# Patient Record
Sex: Female | Born: 1985 | Race: White | Hispanic: No | Smoking: Current every day smoker
Health system: Southern US, Community
[De-identification: ages and names within clinical notes are randomized; demographics above are authoritative.]

## PROBLEM LIST (undated history)

## (undated) DIAGNOSIS — F319 Bipolar disorder, unspecified: Secondary | ICD-10-CM

## (undated) DIAGNOSIS — M069 Rheumatoid arthritis, unspecified: Secondary | ICD-10-CM

## (undated) DIAGNOSIS — J45909 Unspecified asthma, uncomplicated: Secondary | ICD-10-CM

## (undated) DIAGNOSIS — R42 Dizziness and giddiness: Secondary | ICD-10-CM

## (undated) DIAGNOSIS — F39 Unspecified mood [affective] disorder: Secondary | ICD-10-CM

## (undated) DIAGNOSIS — B370 Candidal stomatitis: Secondary | ICD-10-CM

## (undated) DIAGNOSIS — F419 Anxiety disorder, unspecified: Secondary | ICD-10-CM

## (undated) DIAGNOSIS — M419 Scoliosis, unspecified: Secondary | ICD-10-CM

## (undated) DIAGNOSIS — R519 Headache, unspecified: Secondary | ICD-10-CM

## (undated) DIAGNOSIS — K219 Gastro-esophageal reflux disease without esophagitis: Secondary | ICD-10-CM

## (undated) DIAGNOSIS — F209 Schizophrenia, unspecified: Secondary | ICD-10-CM

## (undated) DIAGNOSIS — N946 Dysmenorrhea, unspecified: Secondary | ICD-10-CM

## (undated) DIAGNOSIS — R51 Headache: Secondary | ICD-10-CM

## (undated) HISTORY — DX: Dysmenorrhea, unspecified: N94.6

## (undated) HISTORY — DX: Headache, unspecified: R51.9

## (undated) HISTORY — DX: Anxiety disorder, unspecified: F41.9

## (undated) HISTORY — PX: NERVE REPAIR: SHX2083

## (undated) HISTORY — DX: Dizziness and giddiness: R42

## (undated) HISTORY — DX: Rheumatoid arthritis, unspecified: M06.9

## (undated) HISTORY — DX: Unspecified mood (affective) disorder: F39

## (undated) HISTORY — PX: BREAST IMPLANT REMOVAL: SUR1101

## (undated) HISTORY — DX: Headache: R51

## (undated) HISTORY — DX: Candidal stomatitis: B37.0

## (undated) HISTORY — PX: BREAST SURGERY: SHX581

## (undated) HISTORY — PX: MOUTH SURGERY: SHX715

---

## 2003-03-27 ENCOUNTER — Emergency Department (HOSPITAL_COMMUNITY): Admission: EM | Admit: 2003-03-27 | Discharge: 2003-03-27 | Payer: Self-pay | Admitting: Emergency Medicine

## 2005-10-31 ENCOUNTER — Emergency Department (HOSPITAL_COMMUNITY): Admission: EM | Admit: 2005-10-31 | Discharge: 2005-10-31 | Payer: Self-pay | Admitting: Emergency Medicine

## 2007-01-05 ENCOUNTER — Emergency Department (HOSPITAL_COMMUNITY): Admission: EM | Admit: 2007-01-05 | Discharge: 2007-01-06 | Payer: Self-pay | Admitting: Emergency Medicine

## 2007-01-13 ENCOUNTER — Emergency Department (HOSPITAL_COMMUNITY): Admission: EM | Admit: 2007-01-13 | Discharge: 2007-01-14 | Payer: Self-pay | Admitting: Emergency Medicine

## 2007-02-11 ENCOUNTER — Emergency Department (HOSPITAL_COMMUNITY): Admission: EM | Admit: 2007-02-11 | Discharge: 2007-02-11 | Payer: Self-pay | Admitting: Emergency Medicine

## 2007-03-25 ENCOUNTER — Emergency Department (HOSPITAL_COMMUNITY): Admission: EM | Admit: 2007-03-25 | Discharge: 2007-03-25 | Payer: Self-pay | Admitting: *Deleted

## 2007-03-30 ENCOUNTER — Ambulatory Visit (HOSPITAL_COMMUNITY): Admission: RE | Admit: 2007-03-30 | Discharge: 2007-03-30 | Payer: Self-pay | Admitting: Obstetrics and Gynecology

## 2007-04-05 ENCOUNTER — Emergency Department (HOSPITAL_COMMUNITY): Admission: EM | Admit: 2007-04-05 | Discharge: 2007-04-05 | Payer: Self-pay | Admitting: Emergency Medicine

## 2007-05-01 ENCOUNTER — Ambulatory Visit (HOSPITAL_COMMUNITY): Admission: RE | Admit: 2007-05-01 | Discharge: 2007-05-01 | Payer: Self-pay | Admitting: Obstetrics and Gynecology

## 2007-05-22 ENCOUNTER — Ambulatory Visit (HOSPITAL_COMMUNITY): Admission: RE | Admit: 2007-05-22 | Discharge: 2007-05-22 | Payer: Self-pay | Admitting: Obstetrics and Gynecology

## 2007-06-21 ENCOUNTER — Ambulatory Visit (HOSPITAL_COMMUNITY): Admission: RE | Admit: 2007-06-21 | Discharge: 2007-06-21 | Payer: Self-pay | Admitting: Obstetrics and Gynecology

## 2007-07-22 ENCOUNTER — Inpatient Hospital Stay (HOSPITAL_COMMUNITY): Admission: AD | Admit: 2007-07-22 | Discharge: 2007-07-22 | Payer: Self-pay | Admitting: Obstetrics and Gynecology

## 2007-08-20 ENCOUNTER — Inpatient Hospital Stay (HOSPITAL_COMMUNITY): Admission: RE | Admit: 2007-08-20 | Discharge: 2007-08-22 | Payer: Self-pay | Admitting: Obstetrics and Gynecology

## 2007-09-23 ENCOUNTER — Emergency Department (HOSPITAL_COMMUNITY): Admission: EM | Admit: 2007-09-23 | Discharge: 2007-09-23 | Payer: Self-pay | Admitting: Emergency Medicine

## 2010-04-05 ENCOUNTER — Emergency Department (HOSPITAL_COMMUNITY)
Admission: EM | Admit: 2010-04-05 | Discharge: 2010-04-05 | Payer: Self-pay | Source: Home / Self Care | Admitting: Emergency Medicine

## 2010-05-16 ENCOUNTER — Encounter: Payer: Self-pay | Admitting: Obstetrics and Gynecology

## 2010-06-20 ENCOUNTER — Emergency Department (HOSPITAL_COMMUNITY)
Admission: EM | Admit: 2010-06-20 | Discharge: 2010-06-20 | Disposition: A | Discharge status: Home / Self Care | Attending: Emergency Medicine | Admitting: Emergency Medicine

## 2010-06-20 DIAGNOSIS — K089 Disorder of teeth and supporting structures, unspecified: Secondary | ICD-10-CM | POA: Insufficient documentation

## 2010-06-20 DIAGNOSIS — K029 Dental caries, unspecified: Secondary | ICD-10-CM | POA: Insufficient documentation

## 2010-06-20 DIAGNOSIS — F17200 Nicotine dependence, unspecified, uncomplicated: Secondary | ICD-10-CM | POA: Insufficient documentation

## 2010-06-26 ENCOUNTER — Emergency Department (HOSPITAL_COMMUNITY)
Admission: EM | Admit: 2010-06-26 | Discharge: 2010-06-26 | Disposition: A | Discharge status: Home / Self Care | Attending: Emergency Medicine | Admitting: Emergency Medicine

## 2010-06-26 DIAGNOSIS — F3130 Bipolar disorder, current episode depressed, mild or moderate severity, unspecified: Secondary | ICD-10-CM | POA: Insufficient documentation

## 2010-06-26 DIAGNOSIS — R220 Localized swelling, mass and lump, head: Secondary | ICD-10-CM | POA: Insufficient documentation

## 2010-06-26 DIAGNOSIS — R6884 Jaw pain: Secondary | ICD-10-CM | POA: Insufficient documentation

## 2010-06-26 DIAGNOSIS — K089 Disorder of teeth and supporting structures, unspecified: Secondary | ICD-10-CM | POA: Insufficient documentation

## 2010-06-26 DIAGNOSIS — R221 Localized swelling, mass and lump, neck: Secondary | ICD-10-CM | POA: Insufficient documentation

## 2010-06-26 DIAGNOSIS — K029 Dental caries, unspecified: Secondary | ICD-10-CM | POA: Insufficient documentation

## 2010-07-29 ENCOUNTER — Emergency Department (HOSPITAL_COMMUNITY)
Admission: EM | Admit: 2010-07-29 | Discharge: 2010-07-29 | Disposition: A | Discharge status: Home / Self Care | Attending: Emergency Medicine | Admitting: Emergency Medicine

## 2010-07-29 ENCOUNTER — Emergency Department (HOSPITAL_COMMUNITY)

## 2010-07-29 DIAGNOSIS — J40 Bronchitis, not specified as acute or chronic: Secondary | ICD-10-CM | POA: Insufficient documentation

## 2010-07-29 DIAGNOSIS — R112 Nausea with vomiting, unspecified: Secondary | ICD-10-CM | POA: Insufficient documentation

## 2010-07-29 DIAGNOSIS — R062 Wheezing: Secondary | ICD-10-CM | POA: Insufficient documentation

## 2010-07-29 DIAGNOSIS — R05 Cough: Secondary | ICD-10-CM | POA: Insufficient documentation

## 2010-07-29 DIAGNOSIS — J3489 Other specified disorders of nose and nasal sinuses: Secondary | ICD-10-CM | POA: Insufficient documentation

## 2010-07-29 DIAGNOSIS — R197 Diarrhea, unspecified: Secondary | ICD-10-CM | POA: Insufficient documentation

## 2010-07-29 DIAGNOSIS — F17200 Nicotine dependence, unspecified, uncomplicated: Secondary | ICD-10-CM | POA: Insufficient documentation

## 2010-07-29 DIAGNOSIS — R509 Fever, unspecified: Secondary | ICD-10-CM | POA: Insufficient documentation

## 2010-07-29 DIAGNOSIS — R059 Cough, unspecified: Secondary | ICD-10-CM | POA: Insufficient documentation

## 2010-07-29 DIAGNOSIS — F3130 Bipolar disorder, current episode depressed, mild or moderate severity, unspecified: Secondary | ICD-10-CM | POA: Insufficient documentation

## 2010-09-07 NOTE — Discharge Summary (Signed)
NAME:  Laura Montoya, Laura Montoya              ACCOUNT NO.:  [id number]   MEDICAL RECORD NO.:  [id number]          PATIENT TYPE:  INP   LOCATION:  9123                          FACILITY:  [facility]   PHYSICIAN:  Malachi Pro. Ambrose Mantle, M.D. DATE OF BIRTH:  Jul 25, 1985   DATE OF ADMISSION:  08/20/2007  DATE OF DISCHARGE:  08/22/2007                               DISCHARGE SUMMARY   HISTORY OF PRESENT ILLNESS:  This is a 25 year old white separated  female para 0-0-2-0 gravida 3, EDC August 17, 2007, admitted for  induction of labor.  Blood group and type O positive, negative antibody,  nonreactive serology, rubella immune, hepatitis B surface antigen  negative, HIV negative, GC and chlamydia negative.  One-hour Glucola 82.  Group B strep negative.  Ultrasound on March 30, 2007, showed an  average gestational age of [redacted] weeks and 4 days, [facility] August 20, 2007.  Two-  vessel cord was seen.  The patient declined first and second trimester  screens.  She was advised to quit smoking.  Fetal echocardiogram was  normal.  Nonstress test were begun for two-vessel cord and were normal.  Ultrasound showed normal AFI an average for gestational age infant.  On  August 13, 2007, the cervix was at fingertip 40% vertex at a -3.   PAST MEDICAL HISTORY:  No known allergies.   OPERATIONS:  Dilatation and curettage.   ILLNESSES:  Asthma, depression, bipolar, alcohol, tobacco, drugs, and  cocaine use in 2006.   SOCIAL HISTORY:  The patient smoked 3 to 6 cigarettes a day.   FAMILY HISTORY:  Mother with hypothyroidism.  Maternal grandmother high  blood pressure.  Paternal grandmother high blood pressure and maternal  grandfather diabetes.   OBSTETRIC HISTORY:  Spontaneous abortion at 7-8 weeks in 2002 and 2004.   Physical exam on admission revealed normal vital signs.  The heart and  lungs were normal.  The abdomen was soft.  On August 13, 2007, the fundal  height had been 36 cm.  Fetal heart tones were reactive.  Irregular  contractions were present.  There were no decelerations.  Cervix was 2  cm, 60% vertex at a -2.  Artificial rupture of membranes produced clear  fluid.  The patient impressions on admission were intrauterine pregnancy  at 39+ weeks, two-vessel cord, history of cocaine and tobacco use, and  ASCUS pap.  At 1:10 p.m. by the nurse's exam, the cervix was 4 cm, 80%  vertex at a -1.  At 5:40 p.m. the cervix was 9+ cm, 100% vertex at a +1.  The patient became fully dilated and pushed well.  She delivered  spontaneously OA over a second-degree midline and bilateral labial  lacerations a living female infant 7 pounds 2 ounces, Apgars of 9 at 1  and 9 at 5 minutes.  Placenta was intact.  Uterus was normal.  There was  mild shoulder dystocia managed with McRoberts maneuver.  Second-degree  midline laceration in the right labial laceration repaired with 3-0 and  2-0 Vicryl.  Left labial laceration was hemostatic and not repaired.  Blood loss about 400 mL.  Postpartum, the  patient did well and was  discharged on the second postpartum day.  She is planning a trip to  [place] in approximately 10 days.  She is advised to stop the car and  walk around every one and a half to two hours.  Initial hemoglobin was  13.3, hematocrit 37.7, white count 20,000, and platelet count 472,000.  Followup hemoglobin 12.3 and white count 26,800.  RPR was nonreactive.   FINAL DIAGNOSES:  Intrauterine pregnancy at 39 plus weeks, delivered  occipitoanterior position.  History of cocaine use and tobacco use, two-  vessel cord, and ASCUS pap.   OPERATION:  Spontaneous delivery occipitoanterior position, repair of  second-degree midline laceration, and right labial laceration.   FINAL CONDITION:  Improved.   INSTRUCTIONS:  Include our regular discharge instruction booklet.  Percocet 5/325 20 tablets one every 4-6 hours as needed for pain and  Motrin 600 mg 30 tablets one every 6 hours as needed for pain.  She is  to return  in 6 weeks for followup examination.  The patient is also  advised to be on the alert any signs or symptoms of postpartum  depression.      Malachi Pro. Ambrose Mantle, M.D.  Electronically Signed     TFH/MEDQ  D:  08/22/2007  T:  08/22/2007  Job:  [id number]

## 2010-11-08 ENCOUNTER — Emergency Department (HOSPITAL_COMMUNITY)
Admission: EM | Admit: 2010-11-08 | Discharge: 2010-11-08 | Disposition: A | Discharge status: Home / Self Care | Attending: Emergency Medicine | Admitting: Emergency Medicine

## 2010-11-08 DIAGNOSIS — K029 Dental caries, unspecified: Secondary | ICD-10-CM | POA: Insufficient documentation

## 2010-11-08 DIAGNOSIS — K089 Disorder of teeth and supporting structures, unspecified: Secondary | ICD-10-CM | POA: Insufficient documentation

## 2010-11-08 DIAGNOSIS — F319 Bipolar disorder, unspecified: Secondary | ICD-10-CM | POA: Insufficient documentation

## 2011-01-17 LAB — URINALYSIS, ROUTINE W REFLEX MICROSCOPIC
Glucose, UA: NEGATIVE
Hgb urine dipstick: NEGATIVE
Nitrite: NEGATIVE
Protein, ur: NEGATIVE
Urobilinogen, UA: 0.2

## 2011-01-18 LAB — CBC
HCT: 37.7
Hemoglobin: 12.3
Hemoglobin: 13.3
MCV: 99.2
Platelets: 385
Platelets: 472 — ABNORMAL HIGH
RBC: 3.8 — ABNORMAL LOW

## 2011-01-18 LAB — RPR: RPR Ser Ql: NONREACTIVE

## 2011-01-19 LAB — DIFFERENTIAL
Basophils Relative: 0
Eosinophils Relative: 5
Lymphs Abs: 1.6
Monocytes Absolute: 0.6
Monocytes Relative: 10
Neutro Abs: 3.3
Neutrophils Relative %: 58

## 2011-01-19 LAB — CBC
MCHC: 35.1
RDW: 12.5
WBC: 5.8

## 2011-02-01 LAB — DIFFERENTIAL
Basophils Relative: 1
Lymphocytes Relative: 22
Lymphs Abs: 2.3
Neutrophils Relative %: 71

## 2011-02-01 LAB — I-STAT 8, (EC8 V) (CONVERTED LAB)
Bicarbonate: 22.6
Chloride: 105
Operator id: 257131
Sodium: 136
TCO2: 24
pCO2, Ven: 42.1 — ABNORMAL LOW

## 2011-02-01 LAB — CBC
HCT: 37.2
MCHC: 34.3
MCV: 97.1
RBC: 3.84 — ABNORMAL LOW
WBC: 10.1

## 2011-02-01 LAB — URINALYSIS, ROUTINE W REFLEX MICROSCOPIC
Hgb urine dipstick: NEGATIVE
Urobilinogen, UA: 0.2

## 2011-02-01 LAB — WET PREP, GENITAL
WBC, Wet Prep HPF POC: NONE SEEN
Yeast Wet Prep HPF POC: NONE SEEN

## 2011-02-01 LAB — GC/CHLAMYDIA PROBE AMP, GENITAL: GC Probe Amp, Genital: NEGATIVE

## 2011-02-01 LAB — RPR: RPR Ser Ql: NONREACTIVE

## 2011-02-01 LAB — POCT I-STAT CREATININE: Operator id: 257131

## 2011-02-10 ENCOUNTER — Emergency Department (HOSPITAL_COMMUNITY)
Admission: EM | Admit: 2011-02-10 | Discharge: 2011-02-10 | Disposition: A | Discharge status: Home / Self Care | Payer: Self-pay | Attending: Emergency Medicine | Admitting: Emergency Medicine

## 2011-02-10 DIAGNOSIS — K089 Disorder of teeth and supporting structures, unspecified: Secondary | ICD-10-CM | POA: Insufficient documentation

## 2011-08-29 ENCOUNTER — Ambulatory Visit: Admitting: Physical Therapy

## 2011-09-05 ENCOUNTER — Ambulatory Visit: Payer: Medicaid Other | Attending: Family Medicine | Admitting: Physical Therapy

## 2011-09-05 DIAGNOSIS — IMO0001 Reserved for inherently not codable concepts without codable children: Secondary | ICD-10-CM | POA: Insufficient documentation

## 2011-09-05 DIAGNOSIS — M545 Low back pain, unspecified: Secondary | ICD-10-CM | POA: Insufficient documentation

## 2011-09-05 DIAGNOSIS — M546 Pain in thoracic spine: Secondary | ICD-10-CM | POA: Insufficient documentation

## 2011-09-05 DIAGNOSIS — M25559 Pain in unspecified hip: Secondary | ICD-10-CM | POA: Insufficient documentation

## 2011-09-20 ENCOUNTER — Ambulatory Visit: Payer: Medicaid Other | Admitting: Physical Therapy

## 2011-09-28 ENCOUNTER — Ambulatory Visit: Payer: Medicaid Other | Attending: Family Medicine | Admitting: Physical Therapy

## 2011-09-28 DIAGNOSIS — M545 Low back pain, unspecified: Secondary | ICD-10-CM | POA: Insufficient documentation

## 2011-09-28 DIAGNOSIS — M546 Pain in thoracic spine: Secondary | ICD-10-CM | POA: Insufficient documentation

## 2011-09-28 DIAGNOSIS — M25559 Pain in unspecified hip: Secondary | ICD-10-CM | POA: Insufficient documentation

## 2011-09-28 DIAGNOSIS — IMO0001 Reserved for inherently not codable concepts without codable children: Secondary | ICD-10-CM | POA: Insufficient documentation

## 2011-10-05 ENCOUNTER — Encounter: Admitting: Physical Therapy

## 2011-10-31 ENCOUNTER — Emergency Department (HOSPITAL_COMMUNITY)
Admission: EM | Admit: 2011-10-31 | Discharge: 2011-10-31 | Disposition: A | Discharge status: Home / Self Care | Payer: Medicaid Other | Attending: Emergency Medicine | Admitting: Emergency Medicine

## 2011-10-31 ENCOUNTER — Encounter (HOSPITAL_COMMUNITY): Payer: Self-pay | Admitting: Emergency Medicine

## 2011-10-31 DIAGNOSIS — R51 Headache: Secondary | ICD-10-CM | POA: Insufficient documentation

## 2011-10-31 DIAGNOSIS — J3489 Other specified disorders of nose and nasal sinuses: Secondary | ICD-10-CM | POA: Insufficient documentation

## 2011-10-31 DIAGNOSIS — F17200 Nicotine dependence, unspecified, uncomplicated: Secondary | ICD-10-CM | POA: Insufficient documentation

## 2011-10-31 DIAGNOSIS — H9209 Otalgia, unspecified ear: Secondary | ICD-10-CM | POA: Insufficient documentation

## 2011-10-31 DIAGNOSIS — Z7982 Long term (current) use of aspirin: Secondary | ICD-10-CM | POA: Insufficient documentation

## 2011-10-31 HISTORY — DX: Unspecified asthma, uncomplicated: J45.909

## 2011-10-31 MED ORDER — AZITHROMYCIN 250 MG PO TABS: ORAL_TABLET | ORAL | Status: AC

## 2011-10-31 NOTE — ED Notes (Signed)
Pt presenting to ed with c/o left earache and headache x 1 month pt states she saw her pcp and was placed on antibiotics but symptoms persist.

## 2011-10-31 NOTE — ED Provider Notes (Signed)
History     CSN: [id number]  Arrival date & time 10/31/11  1638   First MD Initiated Contact with Patient 10/31/11 1752      Chief Complaint  Patient presents with  . Otalgia  . Headache    (Consider location/radiation/quality/duration/timing/severity/associated sxs/prior treatment) HPI Comments: Patient presents with complaint of left sided face pain, earache, and headache that has been occurring for approximately one month. Patient saw her primary care physician and was diagnosed with a sinus infection. She took a course of amoxicillin that temporarily improved the symptoms. Patient states that over the past week her symptoms have been gradually worsening. She denies fever, nausea, vomiting, or diarrhea. Patient has been treating at home with Tylenol and Aleve, and Phenergan. These helped somewhat. No sick contacts. Onset was gradual. Course is gradually worsening. Nothing makes the symptoms worse. She has not seen an ENT physician before.  Patient is a 26 y.o. female presenting with ear pain and headaches. The history is provided by the patient.  Otalgia This is a new problem. The current episode started more than 1 week ago. There is pain in the left ear. The problem occurs constantly. The problem has been gradually worsening. There has been no fever. The pain is mild. Associated symptoms include headaches, rhinorrhea and cough. Pertinent negatives include no ear discharge, no sore throat, no abdominal pain, no diarrhea, no vomiting and no rash.  Headache  Pertinent negatives include no fever, no nausea and no vomiting.    Past Medical History  Diagnosis Date  . Asthma     Past Surgical History  Procedure Date  . Breast surgery     No family history on file.  History  Substance Use Topics  . Smoking status: Current Everyday Smoker    Types: Cigarettes  . Smokeless tobacco: Not on file  . Alcohol Use: No    OB History    Grav Para Term Preterm Abortions TAB SAB Ect  Mult Living                  Review of Systems  Constitutional: Negative for fever.  HENT: Positive for ear pain, rhinorrhea and sinus pressure. Negative for sore throat and ear discharge.   Eyes: Negative for redness.  Respiratory: Positive for cough.   Cardiovascular: Negative for chest pain.  Gastrointestinal: Negative for nausea, vomiting, abdominal pain and diarrhea.  Genitourinary: Negative for dysuria.  Musculoskeletal: Negative for myalgias.  Skin: Negative for rash.  Neurological: Positive for headaches.    Allergies  Review of patient's allergies indicates no known allergies.  Home Medications   Current Outpatient Rx  Name Route Sig Dispense Refill  . ACETAMINOPHEN 500 MG PO TABS Oral Take 1,000 mg by mouth every 6 (six) hours as needed. pain    . ASPIRIN EC 81 MG PO TBEC Oral Take 81 mg by mouth daily.    Marland Kitchen METHOCARBAMOL 500 MG PO TABS Oral Take 500-1,000 mg by mouth 2 (two) times daily as needed. Muscle spasms    . NAPROXEN 500 MG PO TABS Oral Take 1,000 mg by mouth 2 (two) times daily as needed.    Marland Kitchen PROMETHAZINE HCL 12.5 MG PO TABS Oral Take 12.5 mg by mouth every 6 (six) hours as needed. nausea      BP 109/64  Pulse 56  Temp 98.9 F (37.2 C)  Resp 20  SpO2 100%  LMP 10/27/2011  Physical Exam  Nursing note and vitals reviewed. Constitutional: She appears well-developed and well-nourished.  HENT:  Head: Normocephalic and atraumatic.  Right Ear: Tympanic membrane, external ear and ear canal normal.  Left Ear: Tympanic membrane, external ear and ear canal normal.  Nose: Mucosal edema and rhinorrhea present. Right sinus exhibits no maxillary sinus tenderness and no frontal sinus tenderness. Left sinus exhibits maxillary sinus tenderness and frontal sinus tenderness.  Eyes: Conjunctivae are normal. Right eye exhibits no discharge. Left eye exhibits no discharge.  Neck: Normal range of motion. Neck supple.  Cardiovascular: Normal rate, regular rhythm and  normal heart sounds.   Pulmonary/Chest: Effort normal and breath sounds normal.  Abdominal: Soft. There is no tenderness.  Neurological: She is alert.  Skin: Skin is warm and dry.  Psychiatric: She has a normal mood and affect.    ED Course  Procedures (including critical care time)  Labs Reviewed - No data to display No results found.   1. Facial pain     6:29 PM Patient seen and examined. Urged pt to use sudafed, afrin x 3 days, as needed. Urged ENT follow-up. Will give course abx.   Vital signs reviewed and are as follows: Filed Vitals:   10/31/11 1718  BP: 109/64  Pulse: 56  Temp: 98.9 F (37.2 C)  Resp: 20      MDM  Sinus symptoms x 1 month. Will try abx given prolonged symptoms. Ultimately I think ENT follow-up will be beneficial and referral given. No concern for abscess.         Renne Crigler, [place] 11/03/11 0700

## 2011-11-04 NOTE — ED Provider Notes (Signed)
Medical screening examination/treatment/procedure(s) were performed by non-physician practitioner and as supervising physician I was immediately available for consultation/collaboration.    Micaela Stith R Romaine Neville, MD 11/04/11 1109 

## 2012-05-08 ENCOUNTER — Encounter (HOSPITAL_COMMUNITY): Payer: Self-pay | Admitting: *Deleted

## 2012-05-08 ENCOUNTER — Emergency Department (HOSPITAL_COMMUNITY)
Admission: EM | Admit: 2012-05-08 | Discharge: 2012-05-08 | Disposition: A | Discharge status: Home / Self Care | Payer: Medicaid Other | Attending: Emergency Medicine | Admitting: Emergency Medicine

## 2012-05-08 DIAGNOSIS — R21 Rash and other nonspecific skin eruption: Secondary | ICD-10-CM | POA: Insufficient documentation

## 2012-05-08 DIAGNOSIS — F17200 Nicotine dependence, unspecified, uncomplicated: Secondary | ICD-10-CM | POA: Insufficient documentation

## 2012-05-08 DIAGNOSIS — L299 Pruritus, unspecified: Secondary | ICD-10-CM | POA: Insufficient documentation

## 2012-05-08 DIAGNOSIS — Z8659 Personal history of other mental and behavioral disorders: Secondary | ICD-10-CM | POA: Insufficient documentation

## 2012-05-08 DIAGNOSIS — Z8739 Personal history of other diseases of the musculoskeletal system and connective tissue: Secondary | ICD-10-CM | POA: Insufficient documentation

## 2012-05-08 DIAGNOSIS — T4995XA Adverse effect of unspecified topical agent, initial encounter: Secondary | ICD-10-CM | POA: Insufficient documentation

## 2012-05-08 DIAGNOSIS — T7840XA Allergy, unspecified, initial encounter: Secondary | ICD-10-CM

## 2012-05-08 DIAGNOSIS — J45909 Unspecified asthma, uncomplicated: Secondary | ICD-10-CM | POA: Insufficient documentation

## 2012-05-08 DIAGNOSIS — Z79899 Other long term (current) drug therapy: Secondary | ICD-10-CM | POA: Insufficient documentation

## 2012-05-08 DIAGNOSIS — Z7982 Long term (current) use of aspirin: Secondary | ICD-10-CM | POA: Insufficient documentation

## 2012-05-08 HISTORY — DX: Schizophrenia, unspecified: F20.9

## 2012-05-08 HISTORY — DX: Scoliosis, unspecified: M41.9

## 2012-05-08 MED ORDER — PREDNISONE (PAK) 10 MG PO TABS: ORAL_TABLET | ORAL | Status: DC

## 2012-05-08 MED ORDER — DIPHENHYDRAMINE HCL 50 MG/ML IJ SOLN
50.0000 mg | Freq: Once | INTRAMUSCULAR | Status: AC
  Administered 2012-05-08: 50 mg via INTRAMUSCULAR
  Filled 2012-05-08: qty 1

## 2012-05-08 MED ORDER — PREDNISONE 20 MG PO TABS
60.0000 mg | ORAL_TABLET | Freq: Once | ORAL | Status: AC
  Administered 2012-05-08: 60 mg via ORAL
  Filled 2012-05-08: qty 3

## 2012-05-08 NOTE — ED Provider Notes (Signed)
History   This chart was scribed for non-physician practitioner working with Laura Booze, MD by Sofie Rower, ED Scribe. This patient was seen in room TR10C/TR10C and the patient's care was started at 5:45PM.   CSN: [id number]  Arrival date & time 05/08/12  1707   First MD Initiated Contact with Patient 05/08/12 1745      Chief Complaint  Patient presents with  . Allergic Reaction    (Consider location/radiation/quality/duration/timing/severity/associated sxs/prior treatment) Patient is a 27 y.o. female presenting with allergic reaction and rash. The history is provided by the patient. No language interpreter was used.  Allergic Reaction The primary symptoms are  rash. The primary symptoms do not include shortness of breath. The current episode started 3 to 5 hours ago. The problem has been gradually worsening. This is a new problem.  The rash is associated with itching.  Significant symptoms also include itching.  Rash  The current episode started 3 to 5 hours ago. The problem has been gradually worsening. The problem is associated with an unknown factor. There has been no fever. The rash is present on the trunk, left lower leg, right lower leg and neck. The pain is moderate. The pain has been constant since onset. Associated symptoms include itching. Treatments tried: benadryl. The treatment provided no relief.    Laura Montoya is a 27 y.o. female , with a hx of asthma, schizophrenia, and sinus infection, who presents to the Emergency Department complaining of sudden, progressively worsening allergic reaction, onset today (05/08/12).  Associated symptoms include diffuse itching and eye swelling located at the left eye. The pt reports she has not been in contact with any new chemicals nor applied any new lotions or soaps which may have triggered her allergic reaction. The pt has taken benadryl which does not provide relief of the itching.  The pt denies shortness of breath and difficulty  swallowing.   The pt is a current everyday smoker, however, she does not drink alcohol.      Past Medical History  Diagnosis Date  . Asthma   . Scoliosis   . Schizophrenia     Past Surgical History  Procedure Date  . Breast surgery     No family history on file.  History  Substance Use Topics  . Smoking status: Current Every Day Smoker    Types: Cigarettes  . Smokeless tobacco: Not on file  . Alcohol Use: No    OB History    Grav Para Term Preterm Abortions TAB SAB Ect Mult Living                  Review of Systems  Respiratory: Negative for shortness of breath.   Skin: Positive for itching and rash.  All other systems reviewed and are negative.    Allergies  Review of patient's allergies indicates no known allergies.  Home Medications   Current Outpatient Rx  Name  Route  Sig  Dispense  Refill  . ACETAMINOPHEN 500 MG PO TABS   Oral   Take 1,000 mg by mouth every 6 (six) hours as needed. pain         . ASPIRIN EC 81 MG PO TBEC   Oral   Take 81 mg by mouth daily.         Marland Kitchen METHOCARBAMOL 500 MG PO TABS   Oral   Take 500-1,000 mg by mouth 2 (two) times daily as needed. Muscle spasms         . NAPROXEN  500 MG PO TABS   Oral   Take 1,000 mg by mouth 2 (two) times daily as needed.         Marland Kitchen PROMETHAZINE HCL 12.5 MG PO TABS   Oral   Take 12.5 mg by mouth every 6 (six) hours as needed. nausea           BP 114/48  Pulse 74  Temp 98.7 F (37.1 C) (Oral)  Resp 18  SpO2 100%  Physical Exam  Nursing note and vitals reviewed. Constitutional: She is oriented to person, place, and time. She appears well-developed and well-nourished.  HENT:  Head: Atraumatic.  Right Ear: External ear normal.  Left Ear: External ear normal.  Nose: Nose normal.  Mouth/Throat: No oropharyngeal exudate.  Eyes: EOM are normal.  Neck: Normal range of motion.  Cardiovascular: Normal rate, regular rhythm and normal heart sounds.   Pulmonary/Chest: Effort  normal and breath sounds normal. No respiratory distress.  Abdominal: Soft. Bowel sounds are normal.  Musculoskeletal: Normal range of motion. She exhibits no edema and no tenderness.  Neurological: She is alert and oriented to person, place, and time.  Skin: Skin is warm and dry. Rash noted.       Urticaria to trunk base, lower extremities, ears and neck.   Psychiatric: She has a normal mood and affect. Her behavior is normal.    ED Course  Procedures (including critical care time)  DIAGNOSTIC STUDIES: Oxygen Saturation is 100% on room air, normal by my interpretation.    COORDINATION OF CARE:  5:49 PM- Treatment plan discussed with patient. Pt agrees with treatment.      Labs Reviewed - No data to display No results found.   No diagnosis found.  Allergic reaction to unknown substance.  Urticaria of extremities, truck, neck, ears, face.  No airway involvement.  Symptoms improved with IM benadryl and 60 mg prednisone po.  Patient to follow-up with her PCP for allergy testing.  MDM   I personally performed the services described in this documentation, which was scribed in my presence. The recorded information has been reviewed and is accurate.         Jimmye Norman, NP 05/09/12 [phone]

## 2012-05-08 NOTE — ED Notes (Signed)
Pt states itching head to toe and has left eye swelling since waking up around 0830.  No other symptoms

## 2012-05-09 NOTE — ED Provider Notes (Signed)
Medical screening examination/treatment/procedure(s) were performed by non-physician practitioner and as supervising physician I was immediately available for consultation/collaboration.   Jonah Gingras, MD 05/09/12 1608 

## 2012-05-28 ENCOUNTER — Encounter: Payer: Self-pay | Admitting: Physical Medicine & Rehabilitation

## 2012-06-12 ENCOUNTER — Ambulatory Visit (HOSPITAL_COMMUNITY)
Admission: RE | Admit: 2012-06-12 | Discharge: 2012-06-12 | Disposition: A | Discharge status: Home / Self Care | Payer: Medicaid Other | Source: Ambulatory Visit | Attending: Physical Medicine & Rehabilitation | Admitting: Physical Medicine & Rehabilitation

## 2012-06-12 ENCOUNTER — Encounter: Payer: Self-pay | Admitting: Physical Medicine & Rehabilitation

## 2012-06-12 ENCOUNTER — Encounter: Payer: Medicaid Other | Attending: Physical Medicine & Rehabilitation | Admitting: Physical Medicine & Rehabilitation

## 2012-06-12 VITALS — BP 67/44 | HR 102 | Resp 14 | Ht 62.0 in | Wt 113.0 lb

## 2012-06-12 DIAGNOSIS — G8929 Other chronic pain: Secondary | ICD-10-CM | POA: Insufficient documentation

## 2012-06-12 DIAGNOSIS — M4120 Other idiopathic scoliosis, site unspecified: Secondary | ICD-10-CM | POA: Insufficient documentation

## 2012-06-12 DIAGNOSIS — Z5181 Encounter for therapeutic drug level monitoring: Secondary | ICD-10-CM

## 2012-06-12 DIAGNOSIS — M4716 Other spondylosis with myelopathy, lumbar region: Secondary | ICD-10-CM

## 2012-06-12 DIAGNOSIS — F17200 Nicotine dependence, unspecified, uncomplicated: Secondary | ICD-10-CM | POA: Insufficient documentation

## 2012-06-12 DIAGNOSIS — M8580 Other specified disorders of bone density and structure, unspecified site: Secondary | ICD-10-CM | POA: Insufficient documentation

## 2012-06-12 DIAGNOSIS — M949 Disorder of cartilage, unspecified: Secondary | ICD-10-CM

## 2012-06-12 DIAGNOSIS — M545 Low back pain, unspecified: Secondary | ICD-10-CM | POA: Insufficient documentation

## 2012-06-12 MED ORDER — NAPROXEN 500 MG PO TABS: 1000.0000 mg | ORAL_TABLET | Freq: Two times a day (BID) | ORAL | Status: DC

## 2012-06-12 NOTE — Progress Notes (Deleted)
  Subjective:    Patient ID: Laura Montoya, female    DOB: [birth year], 27 y.o.   MRN: [id number]  HPI  Pain Inventory Average Pain 6 Pain Right Now 6 My pain is constant, sharp, burning, stabbing and aching  In the last 24 hours, has pain interfered with the following? General activity 5 Relation with others 4 Enjoyment of life 7 What TIME of day is your pain at its worst? morning and night Sleep (in general) Poor  Pain is worse with: bending, sitting, standing and some activites Pain improves with: rest, heat/ice, pacing activities and medication Relief from Meds: 5  Mobility use a cane how many minutes can you walk? 10-15 ability to climb steps?  yes do you drive?  yes Do you have any goals in this area?  no  Function not employed: date last employed 2008 disabled: date disabled 2006 I need assistance with the following:  dressing and bathing Do you have any goals in this area?  yes  Neuro/Psych bladder control problems weakness tingling spasms dizziness depression anxiety suicidal thoughts  Prior Studies bone scan x-rays  Physicians involved in your care Lonie Peak   History reviewed. No pertinent family history. History   Social History  . Marital Status: Married    Spouse Name: N/A    Number of Children: N/A  . Years of Education: N/A   Social History Main Topics  . Smoking status: Current Every Day Smoker    Types: Cigarettes  . Smokeless tobacco: None  . Alcohol Use: No  . Drug Use: No  . Sexually Active:    Other Topics Concern  . None   Social History Narrative  . None   Past Surgical History  Procedure Laterality Date  . Breast surgery     Past Medical History  Diagnosis Date  . Asthma   . Scoliosis   . Schizophrenia    BP 67/44  Pulse 102  Resp 14  Ht 5\' 2"  (1.575 m)  Wt 113 lb (51.256 kg)  BMI 20.66 kg/m2  SpO2 100%  LMP 06/10/2012     Review of Systems     Objective:   Physical Exam         Assessment & Plan:

## 2012-06-12 NOTE — Progress Notes (Signed)
Subjective:    Patient ID: Laura Montoya, female    DOB: 09/25/1985, 27 y.o.   MRN: [id number]  HPI  This is an initial evaluation for Laura Montoya who is here with complaints of chronic mid and low back pain. She remembers first having back pain when she was 27 years old after a metal pole fell on her at home. She was not hospitalized. She had persistent pain throughout her teenage years which has gone on until now. The pain radiated down from the mid back to her pelvis. Her sleep is poor. Bending makes the pain worse. She has a hard time really staying in one position for any period of time. Picking up a gallon of milk is difficult and causes pain in her low back. Sometimes heat helps. Rest breaks and muscle rubs help. Naproxen helps but she rarely uses it because she is afraid of liver toxicity. She uses tylenol occasionally. Robaxin is used at night to help with spasm and sleep.   She was in the army from 2005 to 2006. She developed stress fx'es in her hips amongst other areas and ultimately she had to leave.   sShe reports xrays, CT's, bone scans of her back the last of which was in 2011. She was told she has scoliosis of her spine, ?osteopenia. Laura Montoya also reports them mentioned a problem reported at the L4-5 level. PT was done earlier last year. She does her exercises fairly regularly. She didn't see a whole lot of benefit with therapy.  Headaches are under fairly good control. She has a headache every couple weeks. She has no aura.   Her right shoulder causes pain at times as well.   She has a 53 year old daughter at home.      Pain Inventory Average Pain 6 Pain Right Now 6 My pain is constant, sharp, burning, stabbing and aching  In the last 24 hours, has pain interfered with the following? General activity 5 Relation with others 4 Enjoyment of life 7 What TIME of day is your pain at its worst? morning and night Sleep (in general) Poor  Pain is worse with: bending,  sitting, standing and some activites Pain improves with: rest, heat/ice, pacing activities and medication Relief from Meds: 5  Mobility use a cane how many minutes can you walk? 10-15 ability to climb steps?  yes do you drive?  yes Do you have any goals in this area?  no  Function not employed: date last employed 08 disabled: date disabled 06 I need assistance with the following:  dressing and bathing Do you have any goals in this area?  yes  Neuro/Psych bladder control problems weakness tingling spasms dizziness depression anxiety suicidal thoughts  Prior Studies bone scan x-rays  Physicians involved in your care Lonie Peak   History reviewed. No pertinent family history. History   Social History  . Marital Status: Married    Spouse Name: N/A    Number of Children: N/A  . Years of Education: N/A   Social History Main Topics  . Smoking status: Current Every Day Smoker    Types: Cigarettes  . Smokeless tobacco: None  . Alcohol Use: No  . Drug Use: No  . Sexually Active:    Other Topics Concern  . None   Social History Narrative  . None   Past Surgical History  Procedure Laterality Date  . Breast surgery     Past Medical History  Diagnosis Date  . Asthma   .  Scoliosis   . Schizophrenia    BP 67/44  Pulse 102  Resp 14  Ht 5\' 2"  (1.575 m)  Wt 113 lb (51.256 kg)  BMI 20.66 kg/m2  SpO2 100%  LMP 06/10/2012     Review of Systems  Constitutional: Positive for unexpected weight change.  Respiratory: Positive for cough.   Gastrointestinal: Positive for nausea.  Neurological: Positive for dizziness and weakness.  Psychiatric/Behavioral: Positive for dysphoric mood. The patient is nervous/anxious.   All other systems reviewed and are negative.       Objective:   Physical Exam  General: Alert and oriented x 3, No apparent distress HEENT: Head is normocephalic, atraumatic, PERRLA, EOMI, sclera anicteric, oral mucosa pink and moist,  edentulous, ext ear canals clear,  Neck: Supple without JVD or lymphadenopathy Heart: Reg rate and rhythm. No murmurs rubs or gallops Chest: CTA bilaterally without wheezes, rales, or rhonchi; no distress Abdomen: Soft, non-tender, non-distended, bowel sounds positive. Extremities: No clubbing, cyanosis, or edema. Pulses are 2+ Skin: Clean and intact without signs of breakdown. Numerous tattoos Neuro: Pt is cognitively appropriate with normal insight, memory, and awareness. Cranial nerves 2-12 are intact. Sensory exam is normal. Reflexes are 2+ in all 4's. Fine motor coordination is intact. No tremors. Motor function is grossly 5/5.  Musculoskeletal: Mild lumbar levoscoliosis into the thoracic spine. Pain most severe with palpation over the left PSIS into the low lumbar spine. Mild spasm seen in the left lumbar paraspinals as well as the lower thoracic area (right more than left) Perhaps a 0.25 inch left leg length discrepancy. Mild elevation of right hemipelvis Psych: Pt's affect is appropriate. Pt is cooperative         Assessment & Plan:  1. Chronic low back pain with mild lumbar levoscoliosis, likely discogenic pain at the L4-S1 levels.  2. Myofascial back pain 3. Osteopenia? 4. Hx of paranoid schizophrenia which appears under control  Plan: 1. Resume naproxen but at prescription dose 500mg  bid with food 2. Xrays of the lumbar spine to assess disk spaces and bone density 3. Begin calcium with vitamin D given hx. Discuss furtherlong term management as we progress 4. Pilates exercises were provided. Reviewed the importance of posture and regular exercise 5. 30 minutes of face to face patient care time were spent during this visit. All questions were encouraged and answered.  Follow up with me in 1 month

## 2012-06-12 NOTE — Patient Instructions (Signed)
CONTINUE TO WORK ON RANGE OF MOTION AND POSTURE  BE REALISTIC ABOUT YOUR ACTIVITIES AND EXERCISES

## 2012-06-13 ENCOUNTER — Telehealth: Payer: Self-pay | Admitting: Physical Medicine & Rehabilitation

## 2012-06-13 NOTE — Telephone Encounter (Signed)
I have personally reviewed her films and there are no signs of arthritis or disk space disease. She does have mild levoscoliosis which we noted on her exam. Proceed with current plan

## 2012-06-21 ENCOUNTER — Telehealth: Payer: Self-pay

## 2012-06-21 ENCOUNTER — Telehealth: Payer: Self-pay | Admitting: *Deleted

## 2012-06-21 NOTE — Telephone Encounter (Signed)
Message copied by Judd Gaudier on Thu Jun 21, 2012  8:23 AM ------      Message from: Su Monks      Created: Mon Jun 18, 2012 11:20 AM       She should be NON Narcotic ------

## 2012-06-21 NOTE — Telephone Encounter (Signed)
Returning someone's voicemail to call office. I spoke with Shimika and informed her of her UDS results being + THC and that she would be non narcotic treatment.  She said she understood.

## 2012-06-21 NOTE — Telephone Encounter (Signed)
Left message for patient to call office regarding inconsistent urine drug screen.

## 2012-07-09 ENCOUNTER — Encounter: Payer: Medicaid Other | Attending: Physical Medicine & Rehabilitation | Admitting: Physical Medicine & Rehabilitation

## 2012-07-09 ENCOUNTER — Encounter: Payer: Self-pay | Admitting: Physical Medicine & Rehabilitation

## 2012-07-09 VITALS — BP 94/68 | HR 80 | Resp 14 | Ht 62.0 in | Wt 114.0 lb

## 2012-07-09 DIAGNOSIS — G8929 Other chronic pain: Secondary | ICD-10-CM | POA: Insufficient documentation

## 2012-07-09 DIAGNOSIS — M8580 Other specified disorders of bone density and structure, unspecified site: Secondary | ICD-10-CM

## 2012-07-09 DIAGNOSIS — M4120 Other idiopathic scoliosis, site unspecified: Secondary | ICD-10-CM | POA: Insufficient documentation

## 2012-07-09 DIAGNOSIS — M461 Sacroiliitis, not elsewhere classified: Secondary | ICD-10-CM | POA: Insufficient documentation

## 2012-07-09 DIAGNOSIS — F17200 Nicotine dependence, unspecified, uncomplicated: Secondary | ICD-10-CM | POA: Insufficient documentation

## 2012-07-09 DIAGNOSIS — M545 Low back pain, unspecified: Secondary | ICD-10-CM | POA: Insufficient documentation

## 2012-07-09 DIAGNOSIS — M949 Disorder of cartilage, unspecified: Secondary | ICD-10-CM

## 2012-07-09 DIAGNOSIS — M4716 Other spondylosis with myelopathy, lumbar region: Secondary | ICD-10-CM

## 2012-07-09 NOTE — Patient Instructions (Signed)
WORK ON REGULAR STRETCHING AND RANGE OF MOTION.  USE GOOD POSTURE.

## 2012-07-09 NOTE — Progress Notes (Signed)
Subjective:    Patient ID: Laura Montoya, female    DOB: May 30, 1985, 27 y.o.   MRN: [id number]  HPI  Laura Montoya is back regarding her chronic back pain. The naproxen is helpful, and she is only taking it at night as that is when her pain is the worst. She is using robaxin also.  She continues with some of her basic stretches on a routine basis but doesn't always stretch her legs.  Xrays of her lumbar spine were essentially unremarkable except for a mild lumbar levoscoliosis.  She did reveal her left knee popped out of joint twice after she left my office, but not since.   Pain Inventory Average Pain 6 Pain Right Now 7 My pain is intermittent, burning, stabbing and tingling  In the last 24 hours, has pain interfered with the following? General activity 4 Relation with others 4 Enjoyment of life 5 What TIME of day is your pain at its worst? evening Sleep (in general) Fair  Pain is worse with: bending, sitting, standing and some activites Pain improves with: rest, heat/ice and pacing activities Relief from Meds: 4  Mobility walk without assistance use a cane how many minutes can you walk? 15 ability to climb steps?  yes do you drive?  yes Do you have any goals in this area?  no  Function not employed: date last employed 10 disabled: date disabled 05 I need assistance with the following:  dressing and bathing Do you have any goals in this area?  no  Neuro/Psych numbness tingling trouble walking spasms dizziness depression anxiety  Prior Studies Any changes since last visit?  no  Physicians involved in your care Sharrie Rothman, [place] [facility]-therapist   History reviewed. No pertinent family history. History   Social History  . Marital Status: Married    Spouse Name: N/A    Number of Children: N/A  . Years of Education: N/A   Social History Main Topics  . Smoking status: Current Every Day Smoker    Types: Cigarettes  . Smokeless tobacco: None  .  Alcohol Use: No  . Drug Use: No  . Sexually Active:    Other Topics Concern  . None   Social History Narrative  . None   Past Surgical History  Procedure Laterality Date  . Breast surgery     Past Medical History  Diagnosis Date  . Asthma   . Scoliosis   . Schizophrenia    BP 94/68  Pulse 80  Resp 14  Ht 5\' 2"  (1.575 m)  Wt 114 lb (51.71 kg)  BMI 20.85 kg/m2  SpO2 100%  LMP 06/10/2012     Review of Systems  Constitutional: Positive for appetite change and unexpected weight change.  Musculoskeletal: Positive for back pain and gait problem.  Neurological: Positive for numbness.  Psychiatric/Behavioral: Positive for dysphoric mood. The patient is nervous/anxious.   All other systems reviewed and are negative.       Objective:   Physical Exam General: Alert and oriented x 3, No apparent distress  HEENT: Head is normocephalic, atraumatic, PERRLA, EOMI, sclera anicteric, oral mucosa pink and moist, edentulous, ext ear canals clear,  Neck: Supple without JVD or lymphadenopathy  Heart: Reg rate and rhythm. No murmurs rubs or gallops  Chest: CTA bilaterally without wheezes, rales, or rhonchi; no distress  Abdomen: Soft, non-tender, non-distended, bowel sounds positive.  Extremities: No clubbing, cyanosis, or edema. Pulses are 2+  Skin: Clean and intact without signs of breakdown. Numerous tattoos  Neuro:  Pt is cognitively appropriate with normal insight, memory, and awareness. Cranial nerves 2-12 are intact. Sensory exam is normal. Reflexes are 2+ in all 4's. Fine motor coordination is intact. No tremors. Motor function is grossly 5/5.  Musculoskeletal: Mild lumbar levoscoliosis into the thoracic spine. Pain most severe with palpation over the left PSIS into the low lumbar spine. less spasm seen in the left lumbar paraspinals as well as the lower thoracic area (right more than left)-- ? 0.25 inch left leg length discrepancy. Mild elevation of right hemipelvis. popping  heard/felt in glenuhumeral joint with rapid abduction. Psych: Pt's affect is appropriate. Pt is cooperative   Assessment & Plan:   1. Chronic low back pain, SIJ pain, mild facet arthropathy  2. Myofascial back pain  3. Osteopenia?  4. Hx of paranoid schizophrenia which appears under control    Plan:  1. Continue naproxen in the evening with food as well as muscle relaxant. 2. An extensive handout on SIJ dysfunction and exercises/treatment was provided. Discussed the importance of regular range of motion, stabilizing joints, etc. She appears to have general ligamentous/joint laxity based on exam/history. 3. Continue calcium with vitamin D given hx.    4. Continue with cane for balance and appropriate posture. 5. 30 minutes of face to face patient care time were spent during this visit. All questions were encouraged and answered. Follow up with me in  3 months.

## 2012-10-10 ENCOUNTER — Encounter: Payer: Self-pay | Admitting: Physical Medicine & Rehabilitation

## 2012-10-10 ENCOUNTER — Encounter: Payer: Medicaid Other | Attending: Physical Medicine & Rehabilitation | Admitting: Physical Medicine & Rehabilitation

## 2012-10-10 VITALS — BP 100/63 | HR 93 | Resp 14 | Ht 62.0 in | Wt 111.0 lb

## 2012-10-10 DIAGNOSIS — M899 Disorder of bone, unspecified: Secondary | ICD-10-CM | POA: Insufficient documentation

## 2012-10-10 DIAGNOSIS — M4716 Other spondylosis with myelopathy, lumbar region: Secondary | ICD-10-CM | POA: Insufficient documentation

## 2012-10-10 DIAGNOSIS — M8580 Other specified disorders of bone density and structure, unspecified site: Secondary | ICD-10-CM

## 2012-10-10 DIAGNOSIS — M949 Disorder of cartilage, unspecified: Secondary | ICD-10-CM

## 2012-10-10 DIAGNOSIS — M4120 Other idiopathic scoliosis, site unspecified: Secondary | ICD-10-CM | POA: Insufficient documentation

## 2012-10-10 DIAGNOSIS — M461 Sacroiliitis, not elsewhere classified: Secondary | ICD-10-CM | POA: Insufficient documentation

## 2012-10-10 MED ORDER — TRAMADOL HCL 50 MG PO TABS: 50.0000 mg | ORAL_TABLET | Freq: Three times a day (TID) | ORAL | Status: DC | PRN

## 2012-10-10 NOTE — Progress Notes (Signed)
Subjective:    Patient ID: Laura Montoya, female    DOB: [birth year], 27 y.o.   MRN: [id number]  HPI  Ysabelle is back regarding her low back pain. She has generally been doing fairly well other than suffering from a sinus infection recently. She continues with her basic exercises and stretches. She has been less active recently with the sinus problems. She tries to do restistance training as possible. She has noticed some popping in her shoulder blades more recently.   She prefers hot to cold for pain relief. She likes the hot weather we are experiencing currently.       Pain Inventory Average Pain 7 Pain Right Now 6 My pain is intermittent, sharp, burning and dull  In the last 24 hours, has pain interfered with the following? General activity 6 Relation with others 6 Enjoyment of life 6 What TIME of day is your pain at its worst? evening and night Sleep (in general) Poor  Pain is worse with: sitting, standing and some activites Pain improves with: rest, heat/ice, therapy/exercise, pacing activities and medication Relief from Meds: 4  Mobility walk without assistance walk with assistance use a cane how many minutes can you walk? 30 ability to climb steps?  yes do you drive?  yes Do you have any goals in this area?  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  yes  Neuro/Psych bladder control problems weakness numbness tingling spasms dizziness depression anxiety  Prior Studies Any changes since last visit?  no  Physicians involved in your care Any changes since last visit?  no   Family History  Problem Relation Age of Onset  . Thyroid disease Mother   . Mental illness Father    History   Social History  . Marital Status: Married    Spouse Name: N/A    Number of Children: N/A  . Years of Education: N/A   Social History  Main Topics  . Smoking status: Current Every Day Smoker    Types: Cigarettes  . Smokeless tobacco: None  . Alcohol Use: No  . Drug Use: No  . Sexually Active:    Other Topics Concern  . None   Social History Narrative  . None   Past Surgical History  Procedure Laterality Date  . Breast surgery     Past Medical History  Diagnosis Date  . Asthma   . Scoliosis   . Schizophrenia    BP 100/63  Pulse 93  Resp 14  Ht 5\' 2"  (1.575 m)  Wt 111 lb (50.349 kg)  BMI 20.3 kg/m2  SpO2 98%  LMP 10/05/2012     Review of Systems  Constitutional: Positive for appetite change and unexpected weight change.  Respiratory: Positive for cough.   Gastrointestinal: Positive for nausea.  Genitourinary: Positive for difficulty urinating.  Neurological: Positive for dizziness, weakness and numbness.  Psychiatric/Behavioral: Positive for dysphoric mood. The patient is nervous/anxious.   All other systems reviewed and are negative.       Objective:   Physical Exam  General: Alert and oriented x 3, No apparent distress  HEENT: Head is normocephalic, atraumatic, PERRLA, EOMI, sclera anicteric, oral mucosa pink and moist, edentulous, ext ear canals clear,  Neck: Supple without JVD or lymphadenopathy  Heart: Reg rate and rhythm. No murmurs rubs or gallops  Chest: CTA bilaterally without wheezes, rales, or rhonchi;  no distress  Abdomen: Soft, non-tender, non-distended, bowel sounds positive.  Extremities: No clubbing, cyanosis, or edema. Pulses are 2+  Skin: Clean and intact without signs of breakdown. Numerous tattoos  Neuro: Pt is cognitively appropriate with normal insight, memory, and awareness. Cranial nerves 2-12 are intact. Sensory exam is normal. Reflexes are 2+ in all 4's. Fine motor coordination is intact. No tremors. Motor function is grossly 5/5.  Musculoskeletal: Mild lumbar levoscoliosis into the thoracic spine.  Mild pain at PSIS joints. She was able to flex to 90 degrees  without significant distress.  ? 0.25 inch left leg length discrepancy. Mild elevation of right hemipelvis. popping heard/felt in glenuhumeral joint with rapid abduction. There was also some crepitus along the medial borders of the scapula.  Psych: Pt's affect is appropriate. Pt is cooperative   Assessment & Plan:   1. Chronic low back pain, SIJ pain, mild facet arthropathy  2. Myofascial back pain  3. Osteopenia?  4. Hx of paranoid schizophrenia which appears under control    Plan:  1. Continue naproxen in the evening with food as well as muscle relaxant.  2. Reviewed basic exercise principles and joint laxity. I prefer that she pursue strength training exercises with low resistance initially.  3. Continue calcium with vitamin D given hx.  4. Continue with cane for balance and appropriate posture.  5. Rx'ed ultram for breakthrough pain 50mg  q8 prn.  6. 15 minutes of face to face patient care time were spent during this visit. All questions were encouraged and answered. Follow up with me in 3 months.

## 2012-10-10 NOTE — Patient Instructions (Signed)
CONTINUE TO WORK ON YOUR POSTURE AND GENTLE STRENGTHENING EXERCISES.

## 2012-10-19 ENCOUNTER — Emergency Department (HOSPITAL_COMMUNITY)
Admission: EM | Admit: 2012-10-19 | Discharge: 2012-10-19 | Disposition: A | Discharge status: Home / Self Care | Payer: Medicaid Other | Attending: Emergency Medicine | Admitting: Emergency Medicine

## 2012-10-19 ENCOUNTER — Encounter (HOSPITAL_COMMUNITY): Payer: Self-pay | Admitting: Emergency Medicine

## 2012-10-19 DIAGNOSIS — F17200 Nicotine dependence, unspecified, uncomplicated: Secondary | ICD-10-CM | POA: Insufficient documentation

## 2012-10-19 DIAGNOSIS — J45909 Unspecified asthma, uncomplicated: Secondary | ICD-10-CM | POA: Insufficient documentation

## 2012-10-19 DIAGNOSIS — Z79899 Other long term (current) drug therapy: Secondary | ICD-10-CM | POA: Insufficient documentation

## 2012-10-19 DIAGNOSIS — G8929 Other chronic pain: Secondary | ICD-10-CM | POA: Insufficient documentation

## 2012-10-19 DIAGNOSIS — M549 Dorsalgia, unspecified: Secondary | ICD-10-CM | POA: Insufficient documentation

## 2012-10-19 DIAGNOSIS — M4120 Other idiopathic scoliosis, site unspecified: Secondary | ICD-10-CM | POA: Insufficient documentation

## 2012-10-19 DIAGNOSIS — F209 Schizophrenia, unspecified: Secondary | ICD-10-CM | POA: Insufficient documentation

## 2012-10-19 DIAGNOSIS — G43909 Migraine, unspecified, not intractable, without status migrainosus: Secondary | ICD-10-CM | POA: Insufficient documentation

## 2012-10-19 DIAGNOSIS — R112 Nausea with vomiting, unspecified: Secondary | ICD-10-CM | POA: Insufficient documentation

## 2012-10-19 DIAGNOSIS — Z3202 Encounter for pregnancy test, result negative: Secondary | ICD-10-CM | POA: Insufficient documentation

## 2012-10-19 DIAGNOSIS — Z7982 Long term (current) use of aspirin: Secondary | ICD-10-CM | POA: Insufficient documentation

## 2012-10-19 LAB — URINALYSIS, ROUTINE W REFLEX MICROSCOPIC
Hgb urine dipstick: NEGATIVE
Ketones, ur: NEGATIVE mg/dL
Leukocytes, UA: NEGATIVE
Protein, ur: NEGATIVE mg/dL
Urobilinogen, UA: 0.2 mg/dL (ref 0.0–1.0)

## 2012-10-19 LAB — PREGNANCY, URINE: Preg Test, Ur: NEGATIVE

## 2012-10-19 MED ORDER — DIPHENHYDRAMINE HCL 50 MG/ML IJ SOLN
25.0000 mg | Freq: Once | INTRAMUSCULAR | Status: AC
  Administered 2012-10-19: 25 mg via INTRAVENOUS
  Filled 2012-10-19: qty 1

## 2012-10-19 MED ORDER — SODIUM CHLORIDE 0.9 % IV BOLUS (SEPSIS)
1000.0000 mL | Freq: Once | INTRAVENOUS | Status: AC
  Administered 2012-10-19: 1000 mL via INTRAVENOUS

## 2012-10-19 MED ORDER — METOCLOPRAMIDE HCL 5 MG/ML IJ SOLN
10.0000 mg | Freq: Once | INTRAMUSCULAR | Status: AC
  Administered 2012-10-19: 10 mg via INTRAVENOUS
  Filled 2012-10-19: qty 2

## 2012-10-19 MED ORDER — KETOROLAC TROMETHAMINE 30 MG/ML IJ SOLN
30.0000 mg | Freq: Once | INTRAMUSCULAR | Status: AC
  Administered 2012-10-19: 30 mg via INTRAVENOUS
  Filled 2012-10-19: qty 1

## 2012-10-19 MED ORDER — ONDANSETRON HCL 4 MG PO TABS: 4.0000 mg | ORAL_TABLET | Freq: Four times a day (QID) | ORAL | Status: DC

## 2012-10-19 NOTE — ED Provider Notes (Signed)
History    CSN: [id number] Arrival date & time 10/19/12  0001  First MD Initiated Contact with Patient 10/19/12 0140     Chief Complaint  Patient presents with  . Headache  . Emesis   (Consider location/radiation/quality/duration/timing/severity/associated sxs/prior Treatment) HPI Pt presents with frontal throbbing headache which she states has been present for almost one week.  Pt states headache is constant.  She has had intermittent nausea and vomiting assoicated- tonight nausea was worse which prompted ED evaluation.  No fever, no neck pain.  Has chronic and unchanged back pain.  No changes in vision or speech.  No focal weakness. No syncope.  She states she has a hx of migraines and tried taking maxalt when the headache began but it did not help.  She has also been taking naproxyn.  There are no other associated systemic symptoms, there are no other alleviating or modifying factors.  Past Medical History  Diagnosis Date  . Asthma   . Scoliosis   . Schizophrenia    Past Surgical History  Procedure Laterality Date  . Breast surgery    . Mouth surgery     Family History  Problem Relation Age of Onset  . Thyroid disease Mother   . Mental illness Father    History  Substance Use Topics  . Smoking status: Current Every Day Smoker    Types: Cigarettes  . Smokeless tobacco: Not on file  . Alcohol Use: No   OB History   Grav Para Term Preterm Abortions TAB SAB Ect Mult Living                 Review of Systems ROS reviewed and all otherwise negative except for mentioned in HPI  Allergies  Review of patient's allergies indicates no known allergies.  Home Medications   Current Outpatient Rx  Name  Route  Sig  Dispense  Refill  . albuterol (PROVENTIL HFA;VENTOLIN HFA) 108 (90 BASE) MCG/ACT inhaler   Inhalation   Inhale 2 puffs into the lungs every 6 (six) hours as needed. For shortness of breath         . aspirin 325 MG tablet   Oral   Take 650 mg by mouth  daily.         . Calcium-Magnesium-Vitamin D (CALCIUM 500 PO)   Oral   Take by mouth.         . diphenhydrAMINE (BENADRYL) 25 mg capsule   Oral   Take 50 mg by mouth every 6 (six) hours as needed. For ithcing         . methocarbamol (ROBAXIN) 500 MG tablet   Oral   Take 500-1,000 mg by mouth 2 (two) times daily as needed. Muscle spasms         . Multiple Vitamins-Minerals (MULTIVITAMIN GUMMIES ADULT PO)   Oral   Take 2 tablets by mouth daily.         . naproxen (NAPROSYN) 500 MG tablet   Oral   Take 2 tablets (1,000 mg total) by mouth 2 (two) times daily with a meal.   60 tablet   3   . traMADol (ULTRAM) 50 MG tablet   Oral   Take 1 tablet (50 mg total) by mouth every 8 (eight) hours as needed for pain.   60 tablet   0   . ondansetron (ZOFRAN) 4 MG tablet   Oral   Take 1 tablet (4 mg total) by mouth every 6 (six) hours.   12 tablet  0    BP 127/84  Pulse 73  Temp(Src) 98 F (36.7 C) (Oral)  Resp 20  SpO2 100%  LMP 10/05/2012 Vitals reviewed Physical Exam Physical Examination: General appearance - alert, well appearing, and in no distress Mental status - alert, oriented to person, place, and time Eyes - pupils equal and reactive, extraocular eye movements intact Mouth - mucous membranes moist, pharynx normal without lesions Neck - supple, no significant adenopathy Chest - clear to auscultation, no wheezes, rales or rhonchi, symmetric air entry Heart - normal rate, regular rhythm, normal S1, S2, no murmurs, rubs, clicks or gallops Abdomen - soft, nontender, nondistended, no masses or organomegaly Neurological - alert, oriented x 3, cranial nerves 2-12 tested and intact, strength 5/5 in extremities x 4, sensation intact Extremities - peripheral pulses normal, no pedal edema, no clubbing or cyanosis Skin - normal coloration and turgor, no rashes  ED Course  Procedures (including critical care time)  4:27 AM pt rechecked and states she feels much  improved.  Her headache is completely resolved.   Labs Reviewed  URINALYSIS, ROUTINE W REFLEX MICROSCOPIC  PREGNANCY, URINE   No results found. 1. Migraine     MDM  Pt prsenting with headache c/w her prior migraines.  No signs or symptoms c/w meningitis, SAH or other acute emergent cause.  Pt feels much improved after migraine cocktail.  Discharged with strict return precautions.  Pt agreeable with plan.  Ethelda Chick, MD 10/19/12 [phone]

## 2012-10-19 NOTE — ED Notes (Signed)
Pt states she started with a headache last Wednesday and has had nausea  Pt states the headache is persistent and lasts all day  Pt states she has been taking naproxyn at night to help her sleep  Pt states the pain is on the right side of her head and she is sensitive to light  Pt states she has had vomiting tonight

## 2012-12-25 ENCOUNTER — Encounter (HOSPITAL_COMMUNITY): Payer: Self-pay | Admitting: *Deleted

## 2012-12-25 ENCOUNTER — Emergency Department (HOSPITAL_COMMUNITY)
Admission: EM | Admit: 2012-12-25 | Discharge: 2012-12-25 | Disposition: A | Discharge status: Home / Self Care | Payer: Medicaid Other | Attending: Emergency Medicine | Admitting: Emergency Medicine

## 2012-12-25 DIAGNOSIS — Z8659 Personal history of other mental and behavioral disorders: Secondary | ICD-10-CM | POA: Insufficient documentation

## 2012-12-25 DIAGNOSIS — Z8739 Personal history of other diseases of the musculoskeletal system and connective tissue: Secondary | ICD-10-CM | POA: Insufficient documentation

## 2012-12-25 DIAGNOSIS — R102 Pelvic and perineal pain: Secondary | ICD-10-CM

## 2012-12-25 DIAGNOSIS — J45909 Unspecified asthma, uncomplicated: Secondary | ICD-10-CM | POA: Insufficient documentation

## 2012-12-25 DIAGNOSIS — Z79899 Other long term (current) drug therapy: Secondary | ICD-10-CM | POA: Insufficient documentation

## 2012-12-25 DIAGNOSIS — Z3202 Encounter for pregnancy test, result negative: Secondary | ICD-10-CM | POA: Insufficient documentation

## 2012-12-25 DIAGNOSIS — Z7982 Long term (current) use of aspirin: Secondary | ICD-10-CM | POA: Insufficient documentation

## 2012-12-25 DIAGNOSIS — F17200 Nicotine dependence, unspecified, uncomplicated: Secondary | ICD-10-CM | POA: Insufficient documentation

## 2012-12-25 DIAGNOSIS — N898 Other specified noninflammatory disorders of vagina: Secondary | ICD-10-CM | POA: Insufficient documentation

## 2012-12-25 DIAGNOSIS — N949 Unspecified condition associated with female genital organs and menstrual cycle: Secondary | ICD-10-CM | POA: Insufficient documentation

## 2012-12-25 LAB — URINALYSIS, ROUTINE W REFLEX MICROSCOPIC
Bilirubin Urine: NEGATIVE
Hgb urine dipstick: NEGATIVE
Ketones, ur: NEGATIVE mg/dL
Nitrite: NEGATIVE
pH: 6.5 (ref 5.0–8.0)

## 2012-12-25 LAB — WET PREP, GENITAL
Clue Cells Wet Prep HPF POC: NONE SEEN
Trich, Wet Prep: NONE SEEN

## 2012-12-25 MED ORDER — CEFTRIAXONE SODIUM 250 MG IJ SOLR
250.0000 mg | Freq: Once | INTRAMUSCULAR | Status: AC
  Administered 2012-12-25: 250 mg via INTRAMUSCULAR
  Filled 2012-12-25: qty 250

## 2012-12-25 MED ORDER — AZITHROMYCIN 250 MG PO TABS
1000.0000 mg | ORAL_TABLET | Freq: Once | ORAL | Status: AC
  Administered 2012-12-25: 1000 mg via ORAL
  Filled 2012-12-25: qty 4

## 2012-12-25 NOTE — ED Notes (Signed)
Patient is alert and oriented x3.  She was given DC instructions and follow up visit instructions.  Patient gave verbal understanding. She was DC ambulatory under her own power to home.  V/S stable.  He was not showing any signs of distress on DC 

## 2012-12-25 NOTE — ED Provider Notes (Signed)
CSN: [id number]     Arrival date & time 12/25/12  0309 History   First MD Initiated Contact with Patient 12/25/12 [phone]     Chief Complaint  Patient presents with  . Pelvic Pain   (Consider location/radiation/quality/duration/timing/severity/associated sxs/prior Treatment) HPI Comments: Patient presents with a chief complaint of pelvic pain.  She reports that the pain has been present for the past 3 weeks.  No change in the pain today.  Pain located on both the right and the left side of her pelvic area.  Pain does not radiate.  She describes the pain as a cramping pain. She reports that she is sexually active.  LMP was some time the end of July.  She denies dyspareunia.  She states that she has not taken anything for her pain prior to arrival.  She denies urinary symptoms.  Denies nausea, vomiting, or diarrhea.  Denies constipation.  Denies vaginal bleeding.  She does report that she has had some whitish colored vaginal discharge.    The history is provided by the patient.    Past Medical History  Diagnosis Date  . Asthma   . Scoliosis   . Schizophrenia    Past Surgical History  Procedure Laterality Date  . Breast surgery    . Mouth surgery     Family History  Problem Relation Age of Onset  . Thyroid disease Mother   . Mental illness Father    History  Substance Use Topics  . Smoking status: Current Every Day Smoker    Types: Cigarettes  . Smokeless tobacco: Not on file  . Alcohol Use: No   OB History   Grav Para Term Preterm Abortions TAB SAB Ect Mult Living                 Review of Systems  Genitourinary: Positive for vaginal discharge and pelvic pain.  All other systems reviewed and are negative.    Allergies  Review of patient's allergies indicates no known allergies.  Home Medications   Current Outpatient Rx  Name  Route  Sig  Dispense  Refill  . albuterol (PROVENTIL HFA;VENTOLIN HFA) 108 (90 BASE) MCG/ACT inhaler   Inhalation   Inhale 2 puffs into the  lungs every 6 (six) hours as needed. For shortness of breath         . aspirin 325 MG tablet   Oral   Take 650 mg by mouth daily.         . Calcium-Magnesium-Vitamin D (CALCIUM 500 PO)   Oral   Take by mouth.         . diphenhydrAMINE (BENADRYL) 25 mg capsule   Oral   Take 50 mg by mouth every 6 (six) hours as needed. For ithcing         . methocarbamol (ROBAXIN) 500 MG tablet   Oral   Take 500-1,000 mg by mouth 2 (two) times daily as needed. Muscle spasms         . Multiple Vitamins-Minerals (MULTIVITAMIN GUMMIES ADULT PO)   Oral   Take 2 tablets by mouth daily.         . naproxen (NAPROSYN) 500 MG tablet   Oral   Take 2 tablets (1,000 mg total) by mouth 2 (two) times daily with a meal.   60 tablet   3   . ondansetron (ZOFRAN) 4 MG tablet   Oral   Take 1 tablet (4 mg total) by mouth every 6 (six) hours.   12 tablet  0   . traMADol (ULTRAM) 50 MG tablet   Oral   Take 1 tablet (50 mg total) by mouth every 8 (eight) hours as needed for pain.   60 tablet   0    BP 113/63  Pulse 84  Temp(Src) 99.4 F (37.4 C) (Oral)  Resp 16  Ht 5\' 2"  (1.575 m)  Wt 107 lb 8 oz (48.762 kg)  BMI 19.66 kg/m2  SpO2 100%  LMP 11/14/2012 Physical Exam  Nursing note and vitals reviewed. Constitutional: She appears well-developed and well-nourished.  HENT:  Head: Normocephalic and atraumatic.  Neck: Normal range of motion. Neck supple.  Cardiovascular: Normal rate, regular rhythm and normal heart sounds.   Pulmonary/Chest: Effort normal and breath sounds normal.  Abdominal: Soft. Bowel sounds are normal.  Mild tenderness to palpation across lower abdomen.  No rebound or guarding.  Genitourinary: Cervix exhibits no motion tenderness. Right adnexum displays no mass, no tenderness and no fullness. Left adnexum displays no mass, no tenderness and no fullness.  Neurological: She is alert.  Skin: Skin is warm and dry.  Psychiatric: She has a normal mood and affect.    ED  Course  Procedures (including critical care time) Labs Review Labs Reviewed  WET PREP, GENITAL - Abnormal; Notable for the following:    WBC, Wet Prep HPF POC FEW (*)    All other components within normal limits  GC/CHLAMYDIA PROBE AMP  URINALYSIS, ROUTINE W REFLEX MICROSCOPIC  PREGNANCY, URINE   Imaging Review No results found.  MDM  No diagnosis found. Patient presenting with a chief complaint of pelvic pain.  No significant pain with pelvic exam. Patient afebrile. Urine pregnancy negative.  UA negative. GC/Chlamydia pending.  Patient given prophylactic treatment with Azithromycin and Ceftriaxone.  Patient stable for discharge.  Patient given referral to Gynecology.  Return precautions given.    Pascal Lux [place], PA-C 12/26/12 [phone]

## 2012-12-25 NOTE — ED Notes (Signed)
Pt c/o back and pelvic pain 3-4 wks; no period x 6 wks; concerned may be pregnant

## 2012-12-25 NOTE — ED Notes (Addendum)
Patient c/o pelvic pain that is ongoing since early August. Patient states she did not have a normal period in August. Patient states there may be a chance she could be pregnant. Patient states she does not believe that she has an STD "but hey, anything can happen". Patient states she has a hx of schizophrenia. Pelvic exam set up. Patient states "it hurts like I have blue balls down there"

## 2012-12-26 LAB — GC/CHLAMYDIA PROBE AMP: GC Probe RNA: NEGATIVE

## 2012-12-31 ENCOUNTER — Other Ambulatory Visit: Payer: Self-pay | Admitting: Family Medicine

## 2012-12-31 DIAGNOSIS — R102 Pelvic and perineal pain: Secondary | ICD-10-CM

## 2013-01-03 ENCOUNTER — Ambulatory Visit
Admission: RE | Admit: 2013-01-03 | Discharge: 2013-01-03 | Disposition: A | Discharge status: Home / Self Care | Payer: Medicaid Other | Source: Ambulatory Visit | Attending: Family Medicine | Admitting: Family Medicine

## 2013-01-03 DIAGNOSIS — R102 Pelvic and perineal pain: Secondary | ICD-10-CM

## 2013-01-03 NOTE — ED Provider Notes (Signed)
Medical screening examination/treatment/procedure(s) were performed by non-physician practitioner and as supervising physician I was immediately available for consultation/collaboration.   Shon Baton, MD 01/03/13 1414

## 2013-02-06 ENCOUNTER — Encounter: Payer: Medicaid Other | Attending: Physical Medicine & Rehabilitation | Admitting: Physical Medicine & Rehabilitation

## 2013-02-06 ENCOUNTER — Encounter: Payer: Self-pay | Admitting: Physical Medicine & Rehabilitation

## 2013-02-06 VITALS — BP 136/73 | HR 67 | Resp 14 | Ht 62.0 in | Wt 108.6 lb

## 2013-02-06 DIAGNOSIS — M4120 Other idiopathic scoliosis, site unspecified: Secondary | ICD-10-CM

## 2013-02-06 DIAGNOSIS — M461 Sacroiliitis, not elsewhere classified: Secondary | ICD-10-CM

## 2013-02-06 DIAGNOSIS — M4716 Other spondylosis with myelopathy, lumbar region: Secondary | ICD-10-CM

## 2013-02-06 DIAGNOSIS — M545 Low back pain, unspecified: Secondary | ICD-10-CM | POA: Insufficient documentation

## 2013-02-06 DIAGNOSIS — M533 Sacrococcygeal disorders, not elsewhere classified: Secondary | ICD-10-CM | POA: Insufficient documentation

## 2013-02-06 DIAGNOSIS — F200 Paranoid schizophrenia: Secondary | ICD-10-CM | POA: Insufficient documentation

## 2013-02-06 DIAGNOSIS — M7918 Myalgia, other site: Secondary | ICD-10-CM | POA: Insufficient documentation

## 2013-02-06 DIAGNOSIS — M8580 Other specified disorders of bone density and structure, unspecified site: Secondary | ICD-10-CM

## 2013-02-06 DIAGNOSIS — M899 Disorder of bone, unspecified: Secondary | ICD-10-CM

## 2013-02-06 DIAGNOSIS — G8929 Other chronic pain: Secondary | ICD-10-CM | POA: Insufficient documentation

## 2013-02-06 DIAGNOSIS — IMO0001 Reserved for inherently not codable concepts without codable children: Secondary | ICD-10-CM | POA: Insufficient documentation

## 2013-02-06 MED ORDER — TRAMADOL HCL 50 MG PO TABS: 50.0000 mg | ORAL_TABLET | Freq: Three times a day (TID) | ORAL | Status: DC | PRN

## 2013-02-06 NOTE — Patient Instructions (Signed)
CONTINUE TO WORK ON POSTURE AND STRENGTH TRAINING EXERCISES

## 2013-02-06 NOTE — Progress Notes (Signed)
Subjective:    Patient ID: [name], female    DOB: 08-13-1985, 27 y.o.   MRN: [id number]  HPI  Laura Montoya is back regarding her chronic pain. For the most part she has been doing well although she has noticed more pain in her neck over the summer. She notices it more when she looks down then up. She notices some "popping" as well. The pain radiates into both of her shoulders into the clavicular regions. She has tried to stretch and roll her neck to help relieve the pain. She uses a muscle rub sometimes too. Heat can help too, but she moreoften uses this on her back.  Pain Inventory Average Pain 7 Pain Right Now 8 My pain is constant, sharp, burning and tingling  In the last 24 hours, has pain interfered with the following? General activity 6 Relation with others 7 Enjoyment of life 7 What TIME of day is your pain at its worst? evening Sleep (in general) Poor  Pain is worse with: walking, bending, sitting, standing and some activites Pain improves with: rest, heat/ice, pacing activities and medication Relief from Meds: 6  Mobility walk without assistance walk with assistance use a cane how many minutes can you walk? 30 ability to climb steps?  yes do you drive?  yes use a wheelchair Do you have any goals in this area?  yes  Function not employed: date last employed 10/2008 I need assistance with the following:  dressing, bathing, meal prep, household duties and shopping  Neuro/Psych weakness numbness tingling trouble walking spasms dizziness depression anxiety  Prior Studies Any changes since last visit?  no  Physicians involved in your care Any changes since last visit?  no   Family History  Problem Relation Age of Onset  . Thyroid disease Mother   . Mental illness Father    History   Social History  . Marital Status: Single    Spouse Name: N/A    Number of Children: N/A  . Years of Education: N/A   Social History Main Topics  . Smoking  status: Current Every Day Smoker    Types: Cigarettes  . Smokeless tobacco: None  . Alcohol Use: No  . Drug Use: No  . Sexual Activity: None   Other Topics Concern  . None   Social History Narrative  . None   Past Surgical History  Procedure Laterality Date  . Breast surgery    . Mouth surgery     Past Medical History  Diagnosis Date  . Asthma   . Scoliosis   . Schizophrenia    BP 136/73  Pulse 67  Resp 14  Ht 5\' 2"  (1.575 m)  Wt 108 lb 9.6 oz (49.261 kg)  BMI 19.86 kg/m2  SpO2 100%  LMP 02/06/2013     Review of Systems  Constitutional: Positive for appetite change and unexpected weight change.  Respiratory: Positive for cough.   Gastrointestinal: Positive for nausea, vomiting and diarrhea.  Musculoskeletal: Positive for back pain and gait problem.  Neurological: Positive for dizziness, weakness and numbness.       Spasms, tingling  Psychiatric/Behavioral: Positive for dysphoric mood. The patient is nervous/anxious.   All other systems reviewed and are negative.       Objective:   Physical Exam General: Alert and oriented x 3, No apparent distress  HEENT: Head is normocephalic, atraumatic, PERRLA, EOMI, sclera anicteric, oral mucosa pink and moist, edentulous, ext ear canals clear,  Neck: Supple without JVD or lymphadenopathy  Heart: Reg rate and rhythm. No murmurs rubs or gallops  Chest: CTA bilaterally without wheezes, rales, or rhonchi; no distress  Abdomen: Soft, non-tender, non-distended, bowel sounds positive.  Extremities: No clubbing, cyanosis, or edema. Pulses are 2+  Skin: Clean and intact without signs of breakdown. Numerous tattoos  Neuro: Pt is cognitively appropriate with normal insight, memory, and awareness. Cranial nerves 2-12 are intact. Sensory exam is normal. Reflexes are 2+ in all 4's. Fine motor coordination is intact. No tremors. Motor function is grossly 5/5.  Musculoskeletal: Mild lumbar levoscoliosis into the thoracic spine. Mild  pain at PSIS joints. She was able to flex to 90 degrees without significant distress. ? 0.25 inch left leg length discrepancy. Right trap with three trigger points superiorly and one more along the mid belly. The left mid rhomboid also with a TP.  There was also some crepitus along the medial borders of the scapula.  Psych: Pt's affect is appropriate. Pt is cooperative    Assessment & Plan:   1. Chronic low back pain, SIJ pain, mild facet arthropathy  2. Myofascial back pain  3. Osteopenia?  4. Hx of paranoid schizophrenia which appears under control.  Plan:  1. Continue naproxen in the evening with food as well as muscle relaxant.  2. Reviewed basic exercise principles and joint laxity. This likely causes a lot of the "popping" she's noticing in her joints.  3. After informed consent and preparation of the skin with isopropyl alcohol, I injected the right lat (3 injections) and the left mid rhomboid (1 injection) with 2cc of 1% lidocaine. The patient tolerated well, and no complications were experienced. Post-injection instructions were provided.  4. Continue with cane for balance and appropriate posture.  5. Rx'ed refill of ultram for breakthrough pain 50mg  q8 prn.  6. 15 minutes of face to face patient care time were spent during this visit. All questions were encouraged and answered. Follow up with me in 3 months.

## 2013-05-06 ENCOUNTER — Ambulatory Visit
Admission: RE | Admit: 2013-05-06 | Discharge: 2013-05-06 | Disposition: A | Discharge status: Home / Self Care | Payer: Medicaid Other | Source: Ambulatory Visit | Attending: Physical Medicine & Rehabilitation | Admitting: Physical Medicine & Rehabilitation

## 2013-05-06 ENCOUNTER — Encounter: Payer: Self-pay | Admitting: Physical Medicine & Rehabilitation

## 2013-05-06 ENCOUNTER — Telehealth: Payer: Self-pay

## 2013-05-06 ENCOUNTER — Encounter: Payer: Medicaid Other | Attending: Physical Medicine & Rehabilitation | Admitting: Physical Medicine & Rehabilitation

## 2013-05-06 VITALS — BP 116/60 | HR 67 | Resp 14 | Ht 62.0 in | Wt 109.0 lb

## 2013-05-06 DIAGNOSIS — M461 Sacroiliitis, not elsewhere classified: Secondary | ICD-10-CM | POA: Insufficient documentation

## 2013-05-06 DIAGNOSIS — M7918 Myalgia, other site: Secondary | ICD-10-CM

## 2013-05-06 DIAGNOSIS — M949 Disorder of cartilage, unspecified: Secondary | ICD-10-CM

## 2013-05-06 DIAGNOSIS — M899 Disorder of bone, unspecified: Secondary | ICD-10-CM | POA: Insufficient documentation

## 2013-05-06 DIAGNOSIS — M4120 Other idiopathic scoliosis, site unspecified: Secondary | ICD-10-CM

## 2013-05-06 DIAGNOSIS — M542 Cervicalgia: Secondary | ICD-10-CM

## 2013-05-06 DIAGNOSIS — M8580 Other specified disorders of bone density and structure, unspecified site: Secondary | ICD-10-CM

## 2013-05-06 DIAGNOSIS — M4716 Other spondylosis with myelopathy, lumbar region: Secondary | ICD-10-CM | POA: Insufficient documentation

## 2013-05-06 DIAGNOSIS — IMO0001 Reserved for inherently not codable concepts without codable children: Secondary | ICD-10-CM | POA: Insufficient documentation

## 2013-05-06 MED ORDER — TIZANIDINE HCL 2 MG PO TABS: 2.0000 mg | ORAL_TABLET | Freq: Three times a day (TID) | ORAL | Status: DC | PRN

## 2013-05-06 MED ORDER — TIZANIDINE HCL 2 MG PO CAPS: 2.0000 mg | ORAL_CAPSULE | Freq: Three times a day (TID) | ORAL | Status: DC | PRN

## 2013-05-06 NOTE — Patient Instructions (Signed)
WORK ON REGULAR STRETCHING, GOOD POSTURE.

## 2013-05-06 NOTE — Telephone Encounter (Signed)
Called to inform patient that her cervical spine films were normal.

## 2013-05-06 NOTE — Telephone Encounter (Signed)
Let pt know that her cervical spine films look normal. thanks

## 2013-05-06 NOTE — Telephone Encounter (Signed)
She make 2mg  tabs---2mg  caps were all I saw when writing the rx--new E-rx written

## 2013-05-06 NOTE — Telephone Encounter (Signed)
Pharmacy called and said that patient's insurance only covers Zanaflex "tablets" not the "capsules"

## 2013-05-06 NOTE — Progress Notes (Signed)
Subjective:    Patient ID: Laura Montoya, female    DOB: July 19, 1985, 28 y.o.   MRN: [id number]  HPI  Laura Montoya is back regarding her chronic back pain. She has been trying to find work but has had difficulty finding a job that is sedentary in nature.   Her left neck has been tight and "popping more over the last few weeks." When it does pop, it feels as if the tension gives way and the pain improves temporarily.  The last set of tpi's helped for about 4 days.     Pain Inventory Average Pain 7 Pain Right Now 9 My pain is burning, stabbing and tingling  In the last 24 hours, has pain interfered with the following? General activity 8 Relation with others 7 Enjoyment of life 9 What TIME of day is your pain at its worst? evening Sleep (in general) Poor  Pain is worse with: bending and standing Pain improves with: rest, heat/ice, pacing activities and medication Relief from Meds: 5  Mobility walk without assistance walk with assistance use a cane how many minutes can you walk? 20 ability to climb steps?  yes do you drive?  yes transfers alone Do you have any goals in this area?  yes  Function not employed: date last employed na I need assistance with the following:  dressing, bathing, household duties and shopping Do you have any goals in this area?  yes  Neuro/Psych tingling trouble walking spasms dizziness depression anxiety  Prior Studies Any changes since last visit?  no  Physicians involved in your care Any changes since last visit?  no   Family History  Problem Relation Age of Onset  . Thyroid disease Mother   . Mental illness Father    History   Social History  . Marital Status: Single    Spouse Name: N/A    Number of Children: N/A  . Years of Education: N/A   Social History Main Topics  . Smoking status: Current Every Day Smoker    Types: Cigarettes  . Smokeless tobacco: None  . Alcohol Use: No  . Drug Use: No  . Sexual Activity:  None   Other Topics Concern  . None   Social History Narrative  . None   Past Surgical History  Procedure Laterality Date  . Breast surgery    . Mouth surgery     Past Medical History  Diagnosis Date  . Asthma   . Scoliosis   . Schizophrenia    BP 116/60  Pulse 67  Resp 14  Ht 5\' 2"  (1.575 m)  Wt 109 lb (49.442 kg)  BMI 19.93 kg/m2  SpO2 100%     Review of Systems  Constitutional: Positive for unexpected weight change.  Musculoskeletal: Positive for back pain and gait problem.  Neurological: Positive for dizziness.       Tingling, spasms  Psychiatric/Behavioral: Positive for dysphoric mood. The patient is nervous/anxious.   All other systems reviewed and are negative.       Objective:   Physical Exam  General: Alert and oriented x 3, No apparent distress  HEENT: Head is normocephalic, atraumatic, PERRLA, EOMI, sclera anicteric, oral mucosa pink and moist, edentulous, ext ear canals clear,  Neck: Supple without JVD or lymphadenopathy  Heart: Reg rate and rhythm. No murmurs rubs or gallops  Chest: CTA bilaterally without wheezes, rales, or rhonchi; no distress  Abdomen: Soft, non-tender, non-distended, bowel sounds positive.  Extremities: No clubbing, cyanosis, or edema. Pulses are  2+  Skin: Clean and intact without signs of breakdown. Numerous tattoos  Neuro: Pt is cognitively appropriate with normal insight, memory, and awareness. Cranial nerves 2-12 are intact. Sensory exam is normal. Reflexes are 2+ in all 4's. Fine motor coordination is intact. No tremors. Motor function is grossly 5/5.  Musculoskeletal: Mild lumbar levoscoliosis into the thoracic spine. Mild pain at PSIS joints. She was able to flex to 90 degrees without significant distress. ? 0.25 inch left leg length discrepancy.left trap and rhomboids as well as cervical paraspinals are generally tender and taut. Right scapula is elevated slightly. TP. There was also some crepitus along the medial borders  of the scapula.  She has near normal cervical ROM with some pain in right cervical bending and rotation Psych: Pt's affect is appropriate. Pt is cooperative  Assessment & Plan:   1. Chronic low back pain, SIJ pain, mild facet arthropathy  2. Myofascial back pain particularly in the left shoulder girdle 3. Osteopenia?  4. Hx of paranoid schizophrenia which appears under control.    Plan:  1. Continue naproxen in the evening with food as well as muscle relaxant.  2. Reviewed postures, scapular and shoulder ROM, stretching.   3. Hold on TPI's today 4. Will try zanaflex for muscle spasms today in addition to the above  5. Made a referral to [facility] for vocational re-entry. Sedentary duty. Finding some work on diversionary activity would also be helpful.  Reviewed smoking cessation. 6. 15 minutes of face to face patient care time were spent during this visit. All questions were encouraged and answered. Follow up with me in 3 months.

## 2013-08-02 ENCOUNTER — Encounter: Payer: Medicaid Other | Attending: Physical Medicine & Rehabilitation | Admitting: Physical Medicine & Rehabilitation

## 2013-08-02 ENCOUNTER — Encounter: Payer: Self-pay | Admitting: Physical Medicine & Rehabilitation

## 2013-08-02 VITALS — BP 116/80 | HR 66 | Resp 14 | Ht 62.5 in | Wt 111.8 lb

## 2013-08-02 DIAGNOSIS — M7918 Myalgia, other site: Secondary | ICD-10-CM

## 2013-08-02 DIAGNOSIS — M4716 Other spondylosis with myelopathy, lumbar region: Secondary | ICD-10-CM

## 2013-08-02 DIAGNOSIS — M542 Cervicalgia: Secondary | ICD-10-CM

## 2013-08-02 DIAGNOSIS — M461 Sacroiliitis, not elsewhere classified: Secondary | ICD-10-CM

## 2013-08-02 DIAGNOSIS — IMO0001 Reserved for inherently not codable concepts without codable children: Secondary | ICD-10-CM | POA: Insufficient documentation

## 2013-08-02 DIAGNOSIS — M4120 Other idiopathic scoliosis, site unspecified: Secondary | ICD-10-CM | POA: Insufficient documentation

## 2013-08-02 MED ORDER — TIZANIDINE HCL 2 MG PO TABS: 2.0000 mg | ORAL_TABLET | Freq: Three times a day (TID) | ORAL | Status: DC | PRN

## 2013-08-02 NOTE — Patient Instructions (Signed)
WORK ON REGULAR HEAT, STRETCHING---EVERY DAY

## 2013-08-02 NOTE — Progress Notes (Signed)
Subjective:    Patient ID: Laura Montoya, female    DOB: February 01, 1986, 28 y.o.   MRN: [id number]  HPI  Laura Montoya is back regarding her chronic pain. She is having severe right hip pain which has flared up since this morning when she woke up in pain. She doesn't recall anything which would have caused the increase in pain except for perhaps driving a lot yesterday.   She heard from [facility] regarding vocational re-entry and is in the process of evaluation, etc at this point.   We added zanaflex last visit which helped her spasms, more so than the robaxin did.     Pain Inventory Average Pain 6 Pain Right Now 9 My pain is intermittent, dull, stabbing and aching  In the last 24 hours, has pain interfered with the following? General activity 7 Relation with others 6 Enjoyment of life 8 What TIME of day is your pain at its worst? evening Sleep (in general) Fair  Pain is worse with: walking, bending, sitting, standing and some activites Pain improves with: rest, heat/ice, therapy/exercise, pacing activities and medication Relief from Meds: 6  Mobility use a cane how many minutes can you walk? 15 ability to climb steps?  yes do you drive?  yes  Function not employed: date last employed 2010 I need assistance with the following:  dressing, bathing, meal prep, household duties and shopping  Neuro/Psych tingling spasms dizziness depression anxiety  Prior Studies Any changes since last visit?  no  Physicians involved in your care Any changes since last visit?  no   Family History  Problem Relation Age of Onset  . Thyroid disease Mother   . Mental illness Father    History   Social History  . Marital Status: Single    Spouse Name: N/A    Number of Children: N/A  . Years of Education: N/A   Social History Main Topics  . Smoking status: Current Every Day Smoker    Types: Cigarettes  . Smokeless tobacco: None  . Alcohol Use: No  . Drug Use: No  . Sexual  Activity: None   Other Topics Concern  . None   Social History Narrative  . None   Past Surgical History  Procedure Laterality Date  . Breast surgery    . Mouth surgery     Past Medical History  Diagnosis Date  . Asthma   . Scoliosis   . Schizophrenia    BP 116/80  Pulse 66  Resp 14  Ht 5' 2.5" (1.588 m)  Wt 111 lb 12.8 oz (50.712 kg)  BMI 20.11 kg/m2  SpO2 100%  Opioid Risk Score:   Fall Risk Score: Moderate Fall Risk (6-13 points) (educated and handout given on fall prevention in the home.)  Review of Systems  Constitutional: Positive for appetite change and unexpected weight change.  Musculoskeletal:       Spasms  Neurological: Positive for dizziness.       Tingling  Psychiatric/Behavioral: Positive for dysphoric mood. The patient is nervous/anxious.   All other systems reviewed and are negative.      Objective:   Physical Exam  General: Alert and oriented x 3, No apparent distress  HEENT: Head is normocephalic, atraumatic, PERRLA, EOMI, sclera anicteric, oral mucosa pink and moist, edentulous, ext ear canals clear,  Neck: Supple without JVD or lymphadenopathy  Heart: Reg rate and rhythm. No murmurs rubs or gallops  Chest: CTA bilaterally without wheezes, rales, or rhonchi; no distress  Abdomen:  Soft, non-tender, non-distended, bowel sounds positive.  Extremities: No clubbing, cyanosis, or edema. Pulses are 2+  Skin: Clean and intact without signs of breakdown. Numerous tattoos  Neuro: Pt is cognitively appropriate with normal insight, memory, and awareness. Cranial nerves 2-12 are intact. Sensory exam is normal. Reflexes are 2+ in all 4's. Fine motor coordination is intact. No tremors. Motor function is grossly 5/5. Slight intentional tremor. Musculoskeletal: Mild lumbar levoscoliosis into the thoracic spine.0.25 inch left leg length discrepancy.left trap and rhomboids as well as cervical paraspinals are generally tender and taut. Right scapula is elevated  slightly. TP. There was also some crepitus along the medial borders of the scapula. She has near normal cervical ROM with some pain in right cervical bending and rotation. She has tenderness to palpation of the right upper gluts and right hip flexors (RF particularly). She had more difficulty bending today. Psych: Pt's affect is appropriate. Pt is cooperative  Assessment & Plan:   1. Chronic low back pain, SIJ pain, mild facet arthropathy  2. Myofascial back pain particularly in the left shoulder girdle  3. Osteopenia?  4. Hx of paranoid schizophrenia which appears under control.    Plan:  1. Continue naproxen in the evening with food  .  2. Discussed appropriate stretching, stress mgt 3. NoTPI's today  4. Increase zanaflex to 2-4mg  8 prn 5. VR efforts continue 6. 15 minutes of face to face patient care time were spent during this visit. All questions were encouraged and answered. Follow up with me in 4 months.

## 2013-10-22 ENCOUNTER — Encounter: Payer: Self-pay | Admitting: Registered Nurse

## 2013-10-22 ENCOUNTER — Encounter: Payer: Medicaid Other | Attending: Physical Medicine & Rehabilitation | Admitting: Registered Nurse

## 2013-10-22 ENCOUNTER — Ambulatory Visit (HOSPITAL_COMMUNITY)
Admission: RE | Admit: 2013-10-22 | Discharge: 2013-10-22 | Disposition: A | Discharge status: Home / Self Care | Payer: Medicaid Other | Source: Ambulatory Visit | Attending: Registered Nurse | Admitting: Registered Nurse

## 2013-10-22 VITALS — BP 145/75 | HR 106 | Resp 14 | Ht 62.0 in | Wt 119.0 lb

## 2013-10-22 DIAGNOSIS — M25519 Pain in unspecified shoulder: Secondary | ICD-10-CM | POA: Insufficient documentation

## 2013-10-22 DIAGNOSIS — M461 Sacroiliitis, not elsewhere classified: Secondary | ICD-10-CM

## 2013-10-22 DIAGNOSIS — IMO0001 Reserved for inherently not codable concepts without codable children: Secondary | ICD-10-CM

## 2013-10-22 DIAGNOSIS — M4120 Other idiopathic scoliosis, site unspecified: Secondary | ICD-10-CM

## 2013-10-22 DIAGNOSIS — R251 Tremor, unspecified: Secondary | ICD-10-CM

## 2013-10-22 DIAGNOSIS — M7918 Myalgia, other site: Secondary | ICD-10-CM

## 2013-10-22 DIAGNOSIS — R259 Unspecified abnormal involuntary movements: Secondary | ICD-10-CM

## 2013-10-22 DIAGNOSIS — M25512 Pain in left shoulder: Secondary | ICD-10-CM

## 2013-10-22 DIAGNOSIS — M4716 Other spondylosis with myelopathy, lumbar region: Secondary | ICD-10-CM

## 2013-10-22 DIAGNOSIS — M542 Cervicalgia: Secondary | ICD-10-CM

## 2013-10-22 NOTE — Progress Notes (Signed)
Subjective:    Patient ID: Laura Montoya, female    DOB: 03/25/1986, 28 y.o.   MRN: [id number]  HPI: Ms. Laura GullyMelissa Ann Montoya is a 28 year old female who returns for follow up for chronic pain and medication refill. She says her pain is located in her neck and back. She rates her pain 8. Her current exercise regime is yoga, pilate's, and walking. She is requesting to see a neurologist due to frequent tremors. She says her grandfather had parkinson's. [facility]  Pain Inventory Average Pain 7 Pain Right Now 8 My pain is sharp, burning, stabbing and aching  In the last 24 hours, has pain interfered with the following? General activity 8 Relation with others 6 Enjoyment of life 9 What TIME of day is your pain at its worst? evening Sleep (in general) Poor  Pain is worse with: walking, bending, standing and some activites Pain improves with: rest, heat/ice, medication and injections Relief from Meds: 6  Mobility walk without assistance walk with assistance use a cane how many minutes can you walk? 20 ability to climb steps?  yes do you drive?  yes transfers alone Do you have any goals in this area?  no  Function not employed: date last employed 10/06/13 I need assistance with the following:  dressing, bathing, meal prep, household duties and shopping Do you have any goals in this area?  no  Neuro/Psych bladder control problems weakness tingling spasms depression anxiety  Prior Studies Any changes since last visit?  no  Physicians involved in your care Any changes since last visit?  no   Family History  Problem Relation Age of Onset  . Thyroid disease Mother   . Mental illness Father    History   Social History  . Marital Status: Single    Spouse Name: N/A    Number of Children: N/A  . Years of Education: N/A   Social History Main Topics  . Smoking status: Current Every Day Smoker    Types: Cigarettes  . Smokeless  tobacco: None  . Alcohol Use: No  . Drug Use: No  . Sexual Activity: None   Other Topics Concern  . None   Social History Narrative  . None   Past Surgical History  Procedure Laterality Date  . Breast surgery    . Mouth surgery     Past Medical History  Diagnosis Date  . Asthma   . Scoliosis   . Schizophrenia    BP 145/75  Pulse 106  Resp 14  Ht 5\' 2"  (1.575 m)  Wt 119 lb (53.978 kg)  BMI 21.76 kg/m2  SpO2 98%  Opioid Risk Score:   Fall Risk Score: Low Fall Risk (0-5 points) (pt educated on fall risk, brochure given to pt previously)   Review of Systems  Constitutional: Positive for appetite change and unexpected weight change.  Respiratory: Positive for cough.   Genitourinary:       Bladder control problems  Neurological: Positive for weakness.       Tingling, spasms  Psychiatric/Behavioral: The patient is nervous/anxious.        Depression  All other systems reviewed and are negative.      Objective:   Physical Exam  Nursing note and vitals reviewed. Constitutional: She is oriented to person, place, and time. She appears well-developed and well-nourished.  HENT:  Head: Normocephalic and atraumatic.  Neck:  Cervical Paraspinal Tenderness: C-3- C-5  Cardiovascular: Normal rate and  regular rhythm.   Pulmonary/Chest: Effort normal and breath sounds normal.  Musculoskeletal:  Normal Muscle Bulk and Muscle testing Reveals: Upper Extremities: Right Arm Full ROM and Muscle Strength 5/5 Left Arm Decreased ROM 90 degrees and Muscle Strength 5/5. Right AC Joint Tenderness/ ? Impingement Thoracic and Lumbar Hypersensitivity Lower Extremities: Right Leg Full ROM and Muscle Strength 5/5 Left Leg Flexion Produces pain into popliteal fossa and spasms noted. Arises from chair with ease Narrow Based Gait    Neurological: She is alert and oriented to person, place, and time.  Skin: Skin is warm and dry.  Psychiatric: She has a normal mood and affect.           Assessment & Plan:  1. Chronic low back pain, SIJ pain: Continue Naproxen and Ultram.Continue with exercise regime 2. Myofascial back pain particularly in the Right shoulder girdle :? Impingement. X-ray Ordered 3. Tremors: [facility] 4. Muscle Spasms" Continue Zanaflex  20 minutes of face to face patient care time was spent during this visit. All questions were encouraged and answered.  F/U in 4 months.

## 2013-11-11 ENCOUNTER — Ambulatory Visit: Payer: Medicaid Other | Admitting: Neurology

## 2013-11-15 ENCOUNTER — Ambulatory Visit (INDEPENDENT_AMBULATORY_CARE_PROVIDER_SITE_OTHER): Payer: Medicaid Other | Admitting: Neurology

## 2013-11-15 ENCOUNTER — Encounter: Payer: Self-pay | Admitting: Neurology

## 2013-11-15 VITALS — BP 120/68 | HR 72 | Temp 98.9°F | Ht 62.0 in | Wt 122.0 lb

## 2013-11-15 DIAGNOSIS — F411 Generalized anxiety disorder: Secondary | ICD-10-CM

## 2013-11-15 DIAGNOSIS — R259 Unspecified abnormal involuntary movements: Secondary | ICD-10-CM

## 2013-11-15 DIAGNOSIS — R251 Tremor, unspecified: Secondary | ICD-10-CM

## 2013-11-15 NOTE — Patient Instructions (Addendum)
  Please remember, that any kind of tremor may be exacerbated by anxiety, anger, nervousness, excitement, dehydration, sleep deprivation, by caffeine, and low blood sugar values or blood sugar fluctuations. Some medications, especially some antidepressants and lithium can cause or exacerbate tremors. Tremors may temporarily calm down her subside with the use of a benzodiazepine such as Valium or related medications and with alcohol. Be aware however that drinking alcohol is not an approved treatment or appropriate treatment for tremor control and long-term use of benzodiazepines such as Valium, lorazepam, alprazolam, or clonazepam can cause habit formation, physical and psychological addiction.  At this moment, I will do some blood work and we will call you back. I do not detect any parkinsonian signs. I think your tremor is primarily driven by anxiety.   I will be happy to see you back as needed.

## 2013-11-15 NOTE — Progress Notes (Signed)
Subjective:    Patient ID: Avon GullyMelissa Ann Byrd is a 28 y.o. female.  HPI    Huston FoleySaima France Lusty, MD, PhD [facility] [address], [address] [address] [place], [place] [address]  Dear Riley LamEunice,   I saw your patient, Georgann HousekeeperMelissa Lanser, upon your kind request in neurologic clinic today for initial consultation of her tremors. The patient is unaccompanied today. As you know, Ms. Constance HawLanser is a 28 year old right-handed woman with an underlying medical history of chronic pain including back pain and neck pain who is followed by physical medicine and rehabilitation for chronic pain and she has been experiencing an intermittent generalized, but more R sided trembling for the past 3 years. She has noticed it more when she lays down to sleep and sometimes, it prevents her from falling asleep. She takes Benadryl at night for itching, but it does not help her go to sleep. Her MGM has PD and her mother worries about her having PD. She lives with her parents and her 376 yo daughter. She is divorced. She has a Dx schizoaffective D/O and [facility]. She has been on Abilify for 4 months and on Effexor XR 6-8 months. Prior to that she was on Lexapro, Seroquel. She has been on Trazodone for sleep. She admits to being a nervous person and anxiety flares up the tremor. She smokes 1/2 ppd and does not drink alcohol daily. Occasionally she smokes marijuana, which she states helps with the anxiety and the tremors. She has a FHx of bone density problems and has lost all her teeth some 2 years ago. She has not noted a voice or jaw tremor.  She has Klonopin per PCP for sleep, which does not help.    Her Past Medical History Is Significant For: Past Medical History  Diagnosis Date  . Asthma   . Scoliosis   . Schizophrenia     Her Past Surgical History Is Significant For: Past Surgical History  Procedure Laterality Date  . Breast surgery    . Mouth surgery      Her Family History Is  Significant For: Family History  Problem Relation Age of Onset  . Thyroid disease Mother   . Mental illness Father     Her Social History Is Significant For: History   Social History  . Marital Status: Single    Spouse Name: N/A    Number of Children: N/A  . Years of Education: N/A   Social History Main Topics  . Smoking status: Current Every Day Smoker    Types: Cigarettes  . Smokeless tobacco: None  . Alcohol Use: No  . Drug Use: No  . Sexual Activity: None   Other Topics Concern  . None   Social History Narrative  . None    Her Allergies Are:  No Known Allergies:   Her Current Medications Are:  Outpatient Encounter Prescriptions as of 11/15/2013  Medication Sig  . albuterol (PROVENTIL HFA;VENTOLIN HFA) 108 (90 BASE) MCG/ACT inhaler Inhale 2 puffs into the lungs every 6 (six) hours as needed. For shortness of breath  . ARIPiprazole (ABILIFY) 5 MG tablet Take 5 mg by mouth daily.  . Calcium-Magnesium-Vitamin D (CALCIUM 500 PO) Take by mouth.  . diphenhydrAMINE (BENADRYL) 25 mg capsule Take 50 mg by mouth every 6 (six) hours as needed. For ithcing  . naproxen (NAPROSYN) 500 MG tablet Take 2 tablets (1,000 mg total) by mouth 2 (two) times daily with a meal.  . Prenatal Vit-Fe  Fumarate-FA (MULTIVITAMIN-PRENATAL) 27-0.8 MG TABS tablet Take 1 tablet by mouth daily at 12 noon.  Marland Kitchen tiZANidine (ZANAFLEX) 2 MG tablet Take 1-2 tablets (2-4 mg total) by mouth every 8 (eight) hours as needed for muscle spasms.  . traMADol (ULTRAM) 50 MG tablet Take 1 tablet (50 mg total) by mouth every 8 (eight) hours as needed for pain.  Marland Kitchen venlafaxine XR (EFFEXOR-XR) 75 MG 24 hr capsule Take 75 mg by mouth daily.  :   Review of Systems:  Out of a complete 14 point review of systems, all are reviewed and negative with the exception of these symptoms as listed below:   Review of Systems  Constitutional: Positive for appetite change and fatigue.  HENT: Positive for rhinorrhea.   Eyes:  Negative.   Respiratory: Positive for cough.   Cardiovascular: Negative.   Gastrointestinal: Negative.   Endocrine: Positive for polydipsia.  Genitourinary: Negative.   Musculoskeletal: Positive for arthralgias and myalgias.       Cramps  Skin: Negative.   Allergic/Immunologic: Positive for environmental allergies.  Neurological: Positive for dizziness, weakness and headaches.  Hematological: Bruises/bleeds easily.  Psychiatric/Behavioral: Positive for hallucinations, sleep disturbance and decreased concentration. The patient is nervous/anxious.     Objective:  Neurologic Exam  Physical Exam Physical Examination:   Filed Vitals:   11/15/13 0821  BP: 120/68  Pulse: 72  Temp: 98.9 F (37.2 C)    General Examination: The patient is a very pleasant 28 y.o. female in no acute distress. She appears well-developed and well-nourished and adequately groomed. She is anxious and bobs her legs.   HEENT: Normocephalic, atraumatic, pupils are equal, round and reactive to light and accommodation. Funduscopic exam is normal with sharp disc margins noted. Extraocular tracking is good without limitation to gaze excursion or nystagmus noted. Normal smooth pursuit is noted. Hearing is grossly intact. Tympanic membranes are clear bilaterally. Face is symmetric with normal facial animation and normal facial sensation. Speech is clear with no dysarthria noted. There is no hypophonia. There is no lip, neck/head, jaw or voice tremor. Neck is supple with full range of passive and active motion. There are no carotid bruits on auscultation. Oropharynx exam reveals: mild mouth dryness, edentulous state, and mild airway crowding, due to narrow airway entry and tonsils in place and longer tongue. Mallampati is class I. Tongue protrudes centrally and palate elevates symmetrically. Tonsils are 1+ in size.   Chest: Clear to auscultation without wheezing, rhonchi or crackles noted.  Heart: S1+S2+0, regular and  normal without murmurs, rubs or gallops noted.   Abdomen: Soft, non-tender and non-distended with normal bowel sounds appreciated on auscultation.  Extremities: There is no pitting edema in the distal lower extremities bilaterally. Pedal pulses are intact.  Skin: Warm and dry without trophic changes noted. There are no varicose veins.  Musculoskeletal: exam reveals no obvious joint deformities, tenderness or joint swelling or erythema.   Neurologically:  Mental status: The patient is awake, alert and oriented in all 4 spheres. Her immediate and remote memory, attention, language skills and fund of knowledge are appropriate. There is no evidence of aphasia, agnosia, apraxia or anomia. Speech is clear with normal prosody and enunciation. Thought process is linear. Mood is anxious and constricted and affect is constricted.  Cranial nerves II - XII are as described above under HEENT exam. In addition: shoulder shrug is normal with equal shoulder height noted. Motor exam: Normal bulk, strength and tone is noted. There is no drift, or rebound. There is no resting  tremor. There is a bilateral upper extremity postural and action tremor, which is minimal in degree. There tremor frequency is fairly fast and the amplitude is small. On Archimedes spiral drawing there is no significant tremulousness noted. Handwriting is not tremulous and legible. There is no evidence of micrographia.  Romberg is negative. Reflexes are 2+ throughout. Babinski: Toes are flexor bilaterally. Fine motor skills and coordination: intact with normal finger taps, normal hand movements, normal rapid alternating patting, normal foot taps and normal foot agility.  Cerebellar testing: No dysmetria or intention tremor on finger to nose testing. Heel to shin is unremarkable bilaterally. There is no truncal or gait ataxia.  Sensory exam: intact to light touch, pinprick, vibration, temperature sense in the upper and lower extremities.  Gait,  station and balance: She stands easily. No veering to one side is noted. No leaning to one side is noted. Posture is age-appropriate and stance is narrow based. Gait shows normal stride length and normal pace. No problems turning are noted. She turns en bloc. Tandem walk is unremarkable. Intact toe and heel stance is noted.                Assessment and Plan:   In summary, Sennie Gaylon Melchor is a very pleasant 28 y.o.-year old female with a history of chronic back pain and schizoaffective d/o, who reports an intermittent tremor. On exam, she has a very mild postural and action tremor in the UEs, and no evidence of parkinsonism. I reassured the patient in that regard. She is quite anxious appearing. She has an otherwise non-focal exam. 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 She has not had any blood work in about a year, therefore, I will check TSH, HBA1c, CMP, CBC. I explained my findings to her and talked to her about common tremor triggers. I do not suggest any new medication from my end of things but did make her aware that sometimes medications can exacerbate tremors, such as antidepressants and Abilify can sometimes cause drug induced parkinsonism. Again, I do not detect any parkinsonian signs at this time. I can see her back as needed.  I answered all her questions today and the patient was in agreement with the above outlined plan.  Thank you very much for allowing me to participate in the care of this nice patient. If I can be of any further assistance to you please do not hesitate to call me at [phone].  Sincerely,   Huston Foley, MD, PhD

## 2013-11-16 LAB — COMPREHENSIVE METABOLIC PANEL
ALBUMIN: 4.1 g/dL (ref 3.5–5.5)
ALT: 8 IU/L (ref 0–32)
AST: 14 IU/L (ref 0–40)
Albumin/Globulin Ratio: 1.6 (ref 1.1–2.5)
Alkaline Phosphatase: 70 IU/L (ref 39–117)
BUN/Creatinine Ratio: 2 — ABNORMAL LOW (ref 8–20)
BUN: 2 mg/dL — AB (ref 6–20)
CHLORIDE: 102 mmol/L (ref 97–108)
CO2: 23 mmol/L (ref 18–29)
Calcium: 9.6 mg/dL (ref 8.7–10.2)
Creatinine, Ser: 0.85 mg/dL (ref 0.57–1.00)
GFR calc Af Amer: 107 mL/min/{1.73_m2} (ref >59)
GFR calc non Af Amer: 93 mL/min/{1.73_m2} (ref >59)
GLUCOSE: 75 mg/dL (ref 65–99)
Globulin, Total: 2.5 g/dL (ref 1.5–4.5)
Potassium: 4.2 mmol/L (ref 3.5–5.2)
Sodium: 142 mmol/L (ref 134–144)
TOTAL PROTEIN: 6.6 g/dL (ref 6.0–8.5)

## 2013-11-16 LAB — CBC WITH DIFFERENTIAL
BASOS ABS: 0.1 10*3/uL (ref 0.0–0.2)
Basos: 1 %
Eos: 5 %
Eosinophils Absolute: 0.4 10*3/uL (ref 0.0–0.4)
HCT: 41.6 % (ref 34.0–46.6)
Hemoglobin: 14.2 g/dL (ref 11.1–15.9)
IMMATURE GRANS (ABS): 0 10*3/uL (ref 0.0–0.1)
Immature Granulocytes: 0 %
LYMPHS: 38 %
Lymphocytes Absolute: 2.7 10*3/uL (ref 0.7–3.1)
MCH: 33.7 pg — ABNORMAL HIGH (ref 26.6–33.0)
MCHC: 34.1 g/dL (ref 31.5–35.7)
MCV: 99 fL — ABNORMAL HIGH (ref 79–97)
MONOCYTES: 7 %
Monocytes Absolute: 0.5 10*3/uL (ref 0.1–0.9)
NEUTROS PCT: 49 %
Neutrophils Absolute: 3.6 10*3/uL (ref 1.4–7.0)
PLATELETS: 388 10*3/uL — AB (ref 150–379)
RBC: 4.21 x10E6/uL (ref 3.77–5.28)
RDW: 13.8 % (ref 12.3–15.4)
WBC: 7.2 10*3/uL (ref 3.4–10.8)

## 2013-11-16 LAB — VITAMIN D 25 HYDROXY (VIT D DEFICIENCY, FRACTURES): VIT D 25 HYDROXY: 40.3 ng/mL (ref 30.0–100.0)

## 2013-11-16 LAB — HGB A1C W/O EAG: HEMOGLOBIN A1C: 5 % (ref 4.8–5.6)

## 2013-11-16 LAB — TSH: TSH: 0.728 u[IU]/mL (ref 0.450–4.500)

## 2013-11-16 LAB — B12 AND FOLATE PANEL
FOLATE: 4.6 ng/mL (ref >3.0)
VITAMIN B 12: 234 pg/mL (ref 211–946)

## 2013-11-16 LAB — RPR: SYPHILIS RPR SCR: NONREACTIVE

## 2013-11-29 NOTE — Progress Notes (Signed)
Quick Note:  Please call patient and advised her that her lab work for the most part was normal. There were some nonspecific findings including increased size of red blood cells And increase in platelet count which when I compare to older labs was found before. She should have her complete blood count and blood chemistry rechecked in about 3 to 6 months by her primary care physician. Thyroid screening test and diabetes screening test as well as vitamin B12 and vitamin D were normal.  Huston FoleySaima Erin Uecker, MD, PhD [facility]  ______

## 2013-12-02 ENCOUNTER — Ambulatory Visit: Payer: Medicaid Other | Admitting: Physical Medicine & Rehabilitation

## 2013-12-02 NOTE — Progress Notes (Signed)
Left a vm for the patient to call the office for lab results.

## 2014-02-18 ENCOUNTER — Encounter: Payer: Self-pay | Admitting: Registered Nurse

## 2014-02-18 ENCOUNTER — Encounter: Payer: Medicaid Other | Attending: Registered Nurse | Admitting: Registered Nurse

## 2014-02-18 VITALS — BP 120/59 | HR 94 | Resp 14 | Ht 62.0 in | Wt 127.0 lb

## 2014-02-18 DIAGNOSIS — M545 Low back pain: Secondary | ICD-10-CM | POA: Insufficient documentation

## 2014-02-18 DIAGNOSIS — M797 Fibromyalgia: Secondary | ICD-10-CM

## 2014-02-18 DIAGNOSIS — M62838 Other muscle spasm: Secondary | ICD-10-CM | POA: Diagnosis not present

## 2014-02-18 DIAGNOSIS — G8929 Other chronic pain: Secondary | ICD-10-CM | POA: Diagnosis not present

## 2014-02-18 DIAGNOSIS — M25512 Pain in left shoulder: Secondary | ICD-10-CM | POA: Diagnosis not present

## 2014-02-18 DIAGNOSIS — M461 Sacroiliitis, not elsewhere classified: Secondary | ICD-10-CM

## 2014-02-18 DIAGNOSIS — Z5181 Encounter for therapeutic drug level monitoring: Secondary | ICD-10-CM

## 2014-02-18 DIAGNOSIS — M4120 Other idiopathic scoliosis, site unspecified: Secondary | ICD-10-CM

## 2014-02-18 DIAGNOSIS — M4716 Other spondylosis with myelopathy, lumbar region: Secondary | ICD-10-CM

## 2014-02-18 DIAGNOSIS — Z760 Encounter for issue of repeat prescription: Secondary | ICD-10-CM | POA: Diagnosis not present

## 2014-02-18 DIAGNOSIS — M5412 Radiculopathy, cervical region: Secondary | ICD-10-CM | POA: Diagnosis not present

## 2014-02-18 DIAGNOSIS — M7918 Myalgia, other site: Secondary | ICD-10-CM

## 2014-02-18 DIAGNOSIS — M8580 Other specified disorders of bone density and structure, unspecified site: Secondary | ICD-10-CM

## 2014-02-18 DIAGNOSIS — R251 Tremor, unspecified: Secondary | ICD-10-CM | POA: Insufficient documentation

## 2014-02-18 MED ORDER — TRAMADOL HCL 50 MG PO TABS: 50.0000 mg | ORAL_TABLET | Freq: Three times a day (TID) | ORAL | Status: DC | PRN

## 2014-02-18 NOTE — Progress Notes (Signed)
Subjective:    Patient ID: Laura Montoya, female    DOB: Aug 01, 1985, 28 y.o.   MRN: [id number]  HPI: Laura Montoya is a 28 year old female who returns for follow up for chronic pain and medication refill. She says her pain is located in her left shoulder and lower- back. She rates her pain 5. Her current exercise regime is yoga, pilate's, and walking.  Pain Inventory Average Pain 7 Pain Right Now 5 My pain is constant, burning and dull  In the last 24 hours, has pain interfered with the following? General activity 4 Relation with others 6 Enjoyment of life 7 What TIME of day is your pain at its worst? evening Sleep (in general) Poor  Pain is worse with: bending, sitting, standing and some activites Pain improves with: heat/ice and medication Relief from Meds: 5  Mobility walk without assistance ability to climb steps?  yes do you drive?  yes transfers alone Do you have any goals in this area?  no  Function not employed: date last employed 09/2013 Do you have any goals in this area?  no  Neuro/Psych spasms anxiety  Prior Studies Any changes since last visit?  no  Physicians involved in your care Any changes since last visit?  no   Family History  Problem Relation Age of Onset  . Thyroid disease Mother   . Mental illness Father    History   Social History  . Marital Status: Single    Spouse Name: N/A    Number of Children: N/A  . Years of Education: N/A   Social History Main Topics  . Smoking status: Current Every Day Smoker    Types: Cigarettes  . Smokeless tobacco: None  . Alcohol Use: No  . Drug Use: No  . Sexual Activity: None   Other Topics Concern  . None   Social History Narrative  . None   Past Surgical History  Procedure Laterality Date  . Breast surgery    . Mouth surgery     Past Medical History  Diagnosis Date  . Asthma   . Scoliosis   . Schizophrenia    BP 120/59  Pulse 94  Resp 14  Ht 5\' 2"  (1.575 m)  Wt  127 lb (57.607 kg)  BMI 23.22 kg/m2  SpO2 98%  Opioid Risk Score:   Fall Risk Score:   Review of Systems     Objective:   Physical Exam  Nursing note and vitals reviewed. Constitutional: She is oriented to person, place, and time. She appears well-developed and well-nourished.  HENT:  Head: Normocephalic and atraumatic.  Neck: Normal range of motion. Neck supple.  Cardiovascular: Normal rate and regular rhythm.   Pulmonary/Chest: Effort normal and breath sounds normal.  Musculoskeletal:  Normal Muscle Bulk and Muscle Testing Reveals: Upper Extremities:Right Full ROM and Muscle Strength 5/5. Left Decreased ROM 90 Degrees Thoracic and Lumbar Hypersensitivity Lower Extremities: Full ROM and Muscle Strength 5/5 Left Leg Flexion Produces Pain into Popliteal Fossa Arises from chair with ease Narrow Based gait   Neurological: She is alert and oriented to person, place, and time.  Skin: Skin is warm and dry.  Psychiatric: She has a normal mood and affect.          Assessment & Plan:  1. Chronic low back pain, SIJ pain: Continue Naproxen and Ultram. Tramadol called in to pharmacy with 3 refills.Continue with exercise regime  2. Myofascial back pain particularly in the Right shoulder girdle :  Continue with Heat and Exercise Therapy 3. Tremors: [facility]urbishAthar [facility]15[facility]. No New Findings 4. Muscle Spasms" Continue Zanaflex  20 minutes of face to face patient care time was spent during this visit. All questions were encouraged and answered.   F/U in 4 months.

## 2014-06-10 ENCOUNTER — Encounter: Payer: Self-pay | Admitting: Physical Medicine & Rehabilitation

## 2014-06-10 ENCOUNTER — Encounter: Payer: Medicaid Other | Attending: Physical Medicine & Rehabilitation | Admitting: Physical Medicine & Rehabilitation

## 2014-06-10 VITALS — HR 94 | Resp 14

## 2014-06-10 DIAGNOSIS — R52 Pain, unspecified: Secondary | ICD-10-CM | POA: Diagnosis not present

## 2014-06-10 DIAGNOSIS — F200 Paranoid schizophrenia: Secondary | ICD-10-CM | POA: Diagnosis not present

## 2014-06-10 DIAGNOSIS — M4716 Other spondylosis with myelopathy, lumbar region: Secondary | ICD-10-CM

## 2014-06-10 DIAGNOSIS — G8929 Other chronic pain: Secondary | ICD-10-CM | POA: Insufficient documentation

## 2014-06-10 DIAGNOSIS — M545 Low back pain: Secondary | ICD-10-CM | POA: Diagnosis not present

## 2014-06-10 DIAGNOSIS — M461 Sacroiliitis, not elsewhere classified: Secondary | ICD-10-CM

## 2014-06-10 DIAGNOSIS — M7918 Myalgia, other site: Secondary | ICD-10-CM

## 2014-06-10 DIAGNOSIS — M129 Arthropathy, unspecified: Secondary | ICD-10-CM | POA: Diagnosis not present

## 2014-06-10 DIAGNOSIS — M533 Sacrococcygeal disorders, not elsewhere classified: Secondary | ICD-10-CM | POA: Insufficient documentation

## 2014-06-10 DIAGNOSIS — M4120 Other idiopathic scoliosis, site unspecified: Secondary | ICD-10-CM

## 2014-06-10 DIAGNOSIS — M797 Fibromyalgia: Secondary | ICD-10-CM

## 2014-06-10 DIAGNOSIS — M542 Cervicalgia: Secondary | ICD-10-CM

## 2014-06-10 MED ORDER — TIZANIDINE HCL 2 MG PO TABS: 2.0000 mg | ORAL_TABLET | Freq: Three times a day (TID) | ORAL | Status: DC | PRN

## 2014-06-10 NOTE — Progress Notes (Signed)
Subjective:    Patient ID: Laura Montoya, female    DOB: 1986-03-03, 29 y.o.   MRN: [id number]  HPI  Laura Montoya is back regarding her chronic pain. She is having some difficulties sleeping. She is on melatonin which is no longer helping. She occasionally takes a klonopin to help get her to sleep.   The cold weather makes her pain worse---during the last two winter storms, she has developed generalized, diffuse body pain. Today she hurts everywhere.    Pain Inventory Average Pain 6 Pain Right Now 9 My pain is intermittent, burning, stabbing and aching  In the last 24 hours, has pain interfered with the following? General activity 7 Relation with others 8 Enjoyment of life 8 What TIME of day is your pain at its worst? evening and night  Sleep (in general) Fair  Pain is worse with: bending, sitting, standing and some activites Pain improves with: rest, heat/ice, therapy/exercise, medication and injections Relief from Meds: 5  Mobility walk without assistance how many minutes can you walk? 15 ability to climb steps?  yes do you drive?  yes  Function disabled: date disabled .  Neuro/Psych bladder control problems spasms anxiety  Prior Studies Any changes since last visit?  no  Physicians involved in your care Any changes since last visit?  no   Family History  Problem Relation Age of Onset  . Thyroid disease Mother   . Mental illness Father    History   Social History  . Marital Status: Single    Spouse Name: N/A  . Number of Children: N/A  . Years of Education: N/A   Social History Main Topics  . Smoking status: Current Every Day Smoker    Types: Cigarettes  . Smokeless tobacco: Not on file  . Alcohol Use: No  . Drug Use: No  . Sexual Activity: Not on file   Other Topics Concern  . None   Social History Narrative   Past Surgical History  Procedure Laterality Date  . Breast surgery    . Mouth surgery     Past Medical History  Diagnosis  Date  . Asthma   . Scoliosis   . Schizophrenia    Pulse 94  Resp 14  SpO2 96%  Opioid Risk Score:   Fall Risk Score: Moderate Fall Risk (6-13 points)  Review of Systems  Constitutional: Positive for unexpected weight change.  HENT: Negative.   Eyes: Negative.   Respiratory: Negative.   Cardiovascular: Negative.   Gastrointestinal: Negative.   Endocrine: Negative.   Genitourinary: Negative.        Bladder control problems  Musculoskeletal: Positive for myalgias, back pain and arthralgias.  Skin: Negative.   Allergic/Immunologic: Negative.   Neurological: Positive for weakness and numbness.       Spasms  Hematological: Negative.   Psychiatric/Behavioral: The patient is nervous/anxious.        Objective:   Physical Exam  General: Alert and oriented x 3, No apparent distress  HEENT: Head is normocephalic, atraumatic, PERRLA, EOMI, sclera anicteric, oral mucosa pink and moist, edentulous, ext ear canals clear,  Neck: Supple without JVD or lymphadenopathy  Heart: Reg rate and rhythm. No murmurs rubs or gallops  Chest: CTA bilaterally without wheezes, rales, or rhonchi; no distress  Abdomen: Soft, non-tender, non-distended, bowel sounds positive.  Extremities: No clubbing, cyanosis, or edema. Pulses are 2+  Skin: Clean and intact without signs of breakdown. Numerous tattoos  Neuro: Pt is cognitively appropriate with normal insight, memory,  and awareness. Cranial nerves 2-12 are intact. Sensory exam is normal. Reflexes are 2+ in all 4's. Fine motor coordination is intact. No tremors. Motor function is grossly 5/5. Slight intentional tremor.  Musculoskeletal: Mild lumbar levoscoliosis into the thoracic spine persists.  small,0.25 inch left leg length discrepancy.left trap and rhomboids as well as cervical paraspinals are generally tender and taut. Right scapula is elevated slightly. TP. There was also some crepitus along the medial borders of the scapula. She has near normal  cervical ROM with some pain in right cervical bending and rotation. She was able to touch the floor with minimal effort. Was sensitive to touch from head to toe on exam today.  Psych: Pt's affect is appropriate. Pt is cooperative    Assessment & Plan:   1. Chronic low back pain, SIJ pain, mild facet arthropathy  2. Myofascial back pain particularly in the left shoulder girdle. ?FMS 3. Osteopenia?  4. Hx of paranoid schizophrenia which appears under control.    Plan:  1. Continue naproxen in the evening with food .  2. Discussed appropriate stretching, stress mgt, heat therapy, maintain psych follow up 3. Consider anticonvulsant or TCA---but would consult psych first. 4. Increase zanaflex to 2-4mg  8 prn---may take at night  5. Stop tramadol--ineffective 6. 15 minutes of face to face patient care time were spent during this visit. All questions were encouraged and answered. Follow up with me in 4 months.

## 2014-06-10 NOTE — Patient Instructions (Signed)
TRY TAKING TIZANIDINE AT BEDTIME TO ASSIST WITH SLEEP  ALSO CONSIDER INCREASING MELATONIN 6MG  IF TIZANIDINE INEFFECTIVE  MAINTAIN ACTIVITY AS POSSIBLE!!!

## 2014-09-08 ENCOUNTER — Encounter: Payer: Self-pay | Admitting: Physical Medicine & Rehabilitation

## 2014-09-08 ENCOUNTER — Encounter: Payer: Medicaid Other | Attending: Physical Medicine & Rehabilitation | Admitting: Physical Medicine & Rehabilitation

## 2014-09-08 VITALS — BP 108/68 | HR 74 | Resp 14

## 2014-09-08 DIAGNOSIS — M129 Arthropathy, unspecified: Secondary | ICD-10-CM | POA: Diagnosis not present

## 2014-09-08 DIAGNOSIS — M545 Low back pain: Secondary | ICD-10-CM | POA: Insufficient documentation

## 2014-09-08 DIAGNOSIS — M542 Cervicalgia: Secondary | ICD-10-CM

## 2014-09-08 DIAGNOSIS — G8929 Other chronic pain: Secondary | ICD-10-CM | POA: Insufficient documentation

## 2014-09-08 DIAGNOSIS — M797 Fibromyalgia: Secondary | ICD-10-CM

## 2014-09-08 DIAGNOSIS — M4120 Other idiopathic scoliosis, site unspecified: Secondary | ICD-10-CM | POA: Diagnosis not present

## 2014-09-08 DIAGNOSIS — M4716 Other spondylosis with myelopathy, lumbar region: Secondary | ICD-10-CM | POA: Diagnosis not present

## 2014-09-08 DIAGNOSIS — M533 Sacrococcygeal disorders, not elsewhere classified: Secondary | ICD-10-CM | POA: Insufficient documentation

## 2014-09-08 DIAGNOSIS — R52 Pain, unspecified: Secondary | ICD-10-CM | POA: Insufficient documentation

## 2014-09-08 DIAGNOSIS — M7918 Myalgia, other site: Secondary | ICD-10-CM

## 2014-09-08 DIAGNOSIS — F200 Paranoid schizophrenia: Secondary | ICD-10-CM | POA: Insufficient documentation

## 2014-09-08 NOTE — Patient Instructions (Signed)
PLEASE CALL ME WITH ANY PROBLEMS OR QUESTIONS (#[phone]).      

## 2014-09-08 NOTE — Progress Notes (Signed)
Subjective:    Patient ID: Laura Montoya, female    DOB: [birth year], 29 y.o.   MRN: [id number]  HPI   Laura Montoya is here in follow up of her chronic pain. Her back has been more tender over the last few months particularly in the upper thoracic area. It feels like a "tight rubber band which she can't stretch." There is no radiation into the legs or arms. She has been getting massage, using heat/ice, stretching, walking---nothing seems to really relieve it. She is walking a mile per day. The increased zanaflex hasn't helped.     Other than her upper back, she states that she's felt pretty good. She got a new mattress and pillow in April which has helped her sleep a lot.     Pain Inventory Average Pain 6 Pain Right Now 8 My pain is intermittent, burning, stabbing and aching  In the last 24 hours, has pain interfered with the following? General activity 7 Relation with others 9 Enjoyment of life 8 What TIME of day is your pain at its worst? evening Sleep (in general) Poor  Pain is worse with: bending, inactivity and standing Pain improves with: heat/ice, therapy/exercise, medication and injections Relief from Meds: 5  Mobility walk without assistance how many minutes can you walk? 20 ability to climb steps?  yes do you drive?  yes transfers alone Do you have any goals in this area?  no  Function not employed: date last employed . I need assistance with the following:  dressing, bathing, meal prep, household duties and shopping  Neuro/Psych bladder control problems spasms depression anxiety  Prior Studies Any changes since last visit?  no  Physicians involved in your care Any changes since last visit?  no   Family History  Problem Relation Age of Onset  . Thyroid disease Mother   . Mental illness Father    History   Social History  . Marital Status: Single    Spouse Name: N/A  . Number of Children: N/A  . Years of Education: N/A   Social History  Main Topics  . Smoking status: Current Every Day Smoker    Types: Cigarettes  . Smokeless tobacco: Not on file  . Alcohol Use: No  . Drug Use: No  . Sexual Activity: Not on file   Other Topics Concern  . None   Social History Narrative   Past Surgical History  Procedure Laterality Date  . Breast surgery    . Mouth surgery     Past Medical History  Diagnosis Date  . Asthma   . Scoliosis   . Schizophrenia    BP 108/68 mmHg  Pulse 74  Resp 14  SpO2 99%  Opioid Risk Score:   Fall Risk Score: Moderate Fall Risk (6-13 points) (patient previously educated)`1  Depression screen PHQ 2/9  No flowsheet data found.   Review of Systems  Constitutional:       Weight gain Poor appetite  Respiratory: Positive for cough.   Gastrointestinal: Positive for vomiting and diarrhea.  Genitourinary: Positive for urgency and frequency.  Neurological:       Spasms  Psychiatric/Behavioral: Positive for dysphoric mood. The patient is nervous/anxious.   All other systems reviewed and are negative.      Objective:   Physical Exam General: Alert and oriented x 3, No apparent distress  HEENT: Head is normocephalic, atraumatic, PERRLA, EOMI, sclera anicteric, oral mucosa pink and moist, edentulous, ext ear canals clear,  Neck: Supple without JVD  or lymphadenopathy  Heart: Reg rate and rhythm. No murmurs rubs or gallops  Chest: CTA bilaterally without wheezes, rales, or rhonchi; no distress  Abdomen: Soft, non-tender, non-distended, bowel sounds positive.  Extremities: No clubbing, cyanosis, or edema. Pulses are 2+  Skin: Clean and intact without signs of breakdown. Numerous tattoos  Neuro: Pt is cognitively appropriate with normal insight, memory, and awareness. Cranial nerves 2-12 are intact. Sensory exam is normal. Reflexes are 2+ in all 4's. Fine motor coordination is intact. No tremors. Motor function is grossly 5/5. Slight intentional tremor.  Musculoskeletal: small,0.25 inch left  leg length discrepancy.left trap and rhomboids as well as cervical paraspinals are generally tender and taut. Left rhomboids and paraspinals along the scapular border very taut and painful to touch. . She has near normal cervical ROM with some pain in right cervical bending and rotation. She was able to touch the floor with minimal effort.   Psych: Pt's affect is appropriate. Pt is cooperative  Assessment & Plan:   1. Chronic low back pain, SIJ pain, mild facet arthropathy  2. Myofascial back pain particularly in the left shoulder girdle. ?FMS  3. Osteopenia?  4. Hx of paranoid schizophrenia which appears under control.    Plan:  1. Continue naproxen in the evening with food .  2. Discussed appropriate stretching, stress mgt, heat therapy, maintain psych follow up  3. After informed consent and preparation of the skin with isopropyl alcohol, I injected the left thoracic paraspinals and rhomboids (3 sites in total) with 2cc of 1% lidocaine. The patient tolerated well, and no complications were experienced. Post-injection instructions were provided. I provided her with scapular mobilization exercises also.  4. Zanaflex to 2-4mg  8 prn---may take at night  5. Scapular mobilization exercises 6. 15 minutes of face to face patient care time were spent during this visit. All questions were encouraged and answered. Follow up with me in 4 months.

## 2014-11-10 ENCOUNTER — Ambulatory Visit: Payer: Medicaid Other | Admitting: Physical Medicine & Rehabilitation

## 2014-11-12 ENCOUNTER — Encounter: Payer: Medicaid Other | Attending: Physical Medicine & Rehabilitation | Admitting: Physical Medicine & Rehabilitation

## 2014-11-12 ENCOUNTER — Encounter: Payer: Self-pay | Admitting: Physical Medicine & Rehabilitation

## 2014-11-12 VITALS — BP 117/69 | HR 88 | Resp 14

## 2014-11-12 DIAGNOSIS — G8929 Other chronic pain: Secondary | ICD-10-CM | POA: Insufficient documentation

## 2014-11-12 DIAGNOSIS — M797 Fibromyalgia: Secondary | ICD-10-CM

## 2014-11-12 DIAGNOSIS — M129 Arthropathy, unspecified: Secondary | ICD-10-CM | POA: Diagnosis not present

## 2014-11-12 DIAGNOSIS — M4120 Other idiopathic scoliosis, site unspecified: Secondary | ICD-10-CM | POA: Diagnosis not present

## 2014-11-12 DIAGNOSIS — R52 Pain, unspecified: Secondary | ICD-10-CM | POA: Diagnosis not present

## 2014-11-12 DIAGNOSIS — M545 Low back pain: Secondary | ICD-10-CM | POA: Diagnosis not present

## 2014-11-12 DIAGNOSIS — M7918 Myalgia, other site: Secondary | ICD-10-CM

## 2014-11-12 DIAGNOSIS — M533 Sacrococcygeal disorders, not elsewhere classified: Secondary | ICD-10-CM | POA: Diagnosis not present

## 2014-11-12 DIAGNOSIS — F200 Paranoid schizophrenia: Secondary | ICD-10-CM | POA: Diagnosis not present

## 2014-11-12 DIAGNOSIS — M4716 Other spondylosis with myelopathy, lumbar region: Secondary | ICD-10-CM

## 2014-11-12 MED ORDER — TIZANIDINE HCL 4 MG PO TABS: 4.0000 mg | ORAL_TABLET | Freq: Four times a day (QID) | ORAL | Status: DC | PRN

## 2014-11-12 NOTE — Progress Notes (Signed)
Subjective:    Patient ID: Laura Montoya, female    DOB: 1985-05-28, 29 y.o.   MRN: [id number]  HPI   Laura Montoya is here in follow up of her chronic pain. She had excellent results with the injections we did last May. The pain her neck has almost disappeared. She still has some discomfort in her shoulders and upper back. She is working on cervical ROM and shoulder ROM. She remains active with her daughter.  She finds the tizanidine very useful for spasms and sleep. She typically is taking  twice daily.       Pain Inventory Average Pain 7 Pain Right Now 8 My pain is intermittent, burning and stabbing  In the last 24 hours, has pain interfered with the following? General activity 8 Relation with others 6 Enjoyment of life 9 What TIME of day is your pain at its worst? evening Sleep (in general) Fair  Pain is worse with: walking, bending, sitting and standing Pain improves with: rest, heat/ice, therapy/exercise, medication and injections Relief from Meds: 6  Mobility walk without assistance how many minutes can you walk? 20 ability to climb steps?  yes do you drive?  yes Do you have any goals in this area?  no  Function not employed: date last employed . I need assistance with the following:  dressing, bathing, meal prep, household duties and shopping Do you have any goals in this area?  no  Neuro/Psych spasms depression anxiety  Prior Studies Any changes since last visit?  no  Physicians involved in your care Any changes since last visit?  no   Family History  Problem Relation Age of Onset  . Thyroid disease Mother   . Mental illness Father    History   Social History  . Marital Status: Single    Spouse Name: N/A  . Number of Children: N/A  . Years of Education: N/A   Social History Main Topics  . Smoking status: Current Every Day Smoker    Types: Cigarettes  . Smokeless tobacco: Not on file  . Alcohol Use: No  . Drug Use: No  . Sexual  Activity: Not on file   Other Topics Concern  . None   Social History Narrative   Past Surgical History  Procedure Laterality Date  . Breast surgery    . Mouth surgery     Past Medical History  Diagnosis Date  . Asthma   . Scoliosis   . Schizophrenia    BP 117/69 mmHg  Pulse 88  Resp 14  SpO2 98%  Opioid Risk Score:   Fall Risk Score:  `1  Depression screen PHQ 2/9  Depression screen PHQ 2/9 11/12/2014  Decreased Interest 1  Down, Depressed, Hopeless 0  PHQ - 2 Score 1     Review of Systems  Constitutional: Positive for appetite change and unexpected weight change.       Weight gain Poor appetite  Neurological:       Spasms  Psychiatric/Behavioral: Positive for dysphoric mood. The patient is nervous/anxious.   All other systems reviewed and are negative.      Objective:   Physical Exam General: Alert and oriented x 3, No apparent distress  HEENT: Head is normocephalic, atraumatic, PERRLA, EOMI, sclera anicteric, oral mucosa pink and moist, edentulous, ext ear canals clear,  Neck: Supple without JVD or lymphadenopathy  Heart: Reg rate and rhythm. No murmurs rubs or gallops  Chest: CTA bilaterally without wheezes, rales, or rhonchi; no distress  Abdomen: Soft, non-tender, non-distended, bowel sounds positive.  Extremities: No clubbing, cyanosis, or edema. Pulses are 2+  Skin: Clean and intact without signs of breakdown. Numerous tattoos  Neuro: Pt is cognitively appropriate with normal insight, memory, and awareness. Cranial nerves 2-12 are intact. Sensory exam is normal. Reflexes are 2+ in all 4's. Fine motor coordination is intact. No tremors. Motor function is grossly 5/5. Slight intentional tremor.  Musculoskeletal: improved posture, no palpable trigger points. Left rhomboids and paraspinals along the scapular border less taut and painful to touch. near normal cervical ROM with some pain in right cervical bending and rotation. She was able to touch the floor  with minimal effort.  Psych: Pt's affect is appropriate. Pt is cooperative   Assessment & Plan:   1. Chronic low back pain, SIJ pain, mild facet arthropathy  2. Myofascial back pain particularly in the left shoulder girdle. ?FMS  3. Osteopenia?  4. Hx of paranoid schizophrenia which appears under control.   Plan:  1. Continue naproxen in the evening with food .  2. Ongoing stretching, stress mgt, heat therapy, maintain psych follow up  3. No further injections indicated today  4. Zanaflex to 4mg  q8 prn----#75 5. Scapular mobilization exercises were reviewed once again. Discussed pec tightness and stretches also  6. 15 minutes of face to face patient care time were spent during this visit. All questions were encouraged and answered. Follow up with me in 4 months.

## 2014-11-12 NOTE — Patient Instructions (Signed)
PLEASE CALL ME WITH ANY PROBLEMS OR QUESTIONS (#[phone]).  HAVE A GOOD DAY!    

## 2015-03-01 ENCOUNTER — Emergency Department (HOSPITAL_COMMUNITY)
Admission: EM | Admit: 2015-03-01 | Discharge: 2015-03-02 | Disposition: A | Discharge status: Home / Self Care | Payer: Medicaid Other | Attending: Emergency Medicine | Admitting: Emergency Medicine

## 2015-03-01 ENCOUNTER — Encounter (HOSPITAL_COMMUNITY): Payer: Self-pay | Admitting: Nurse Practitioner

## 2015-03-01 DIAGNOSIS — R519 Headache, unspecified: Secondary | ICD-10-CM

## 2015-03-01 DIAGNOSIS — R51 Headache: Secondary | ICD-10-CM | POA: Insufficient documentation

## 2015-03-01 DIAGNOSIS — F209 Schizophrenia, unspecified: Secondary | ICD-10-CM | POA: Diagnosis not present

## 2015-03-01 DIAGNOSIS — J45909 Unspecified asthma, uncomplicated: Secondary | ICD-10-CM | POA: Diagnosis not present

## 2015-03-01 DIAGNOSIS — R42 Dizziness and giddiness: Secondary | ICD-10-CM

## 2015-03-01 DIAGNOSIS — M419 Scoliosis, unspecified: Secondary | ICD-10-CM | POA: Diagnosis not present

## 2015-03-01 DIAGNOSIS — Z792 Long term (current) use of antibiotics: Secondary | ICD-10-CM | POA: Diagnosis not present

## 2015-03-01 DIAGNOSIS — Z720 Tobacco use: Secondary | ICD-10-CM | POA: Insufficient documentation

## 2015-03-01 DIAGNOSIS — Z79899 Other long term (current) drug therapy: Secondary | ICD-10-CM | POA: Diagnosis not present

## 2015-03-01 DIAGNOSIS — H538 Other visual disturbances: Secondary | ICD-10-CM | POA: Diagnosis not present

## 2015-03-01 NOTE — ED Notes (Signed)
Pt states she has a migraine headache localizing on her occipital aspect of her head, radiates to cervical-shoulder region, she adds that the migraine is making her dizzy and have blurred vision. Recently seen for what she states a sinus infection, being treated with abx which she states have made her vomit each time she takes them.

## 2015-03-02 MED ORDER — SODIUM CHLORIDE 0.9 % IV BOLUS (SEPSIS)
1000.0000 mL | Freq: Once | INTRAVENOUS | Status: AC
  Administered 2015-03-02: 1000 mL via INTRAVENOUS

## 2015-03-02 MED ORDER — METOCLOPRAMIDE HCL 5 MG/ML IJ SOLN
10.0000 mg | Freq: Once | INTRAMUSCULAR | Status: AC
Start: 2015-03-02 — End: 2015-03-02
  Administered 2015-03-02: 10 mg via INTRAVENOUS
  Filled 2015-03-02: qty 2

## 2015-03-02 MED ORDER — KETOROLAC TROMETHAMINE 30 MG/ML IJ SOLN
30.0000 mg | Freq: Once | INTRAMUSCULAR | Status: AC
  Administered 2015-03-02: 30 mg via INTRAVENOUS
  Filled 2015-03-02: qty 1

## 2015-03-02 MED ORDER — DIPHENHYDRAMINE HCL 50 MG/ML IJ SOLN
25.0000 mg | Freq: Once | INTRAMUSCULAR | Status: AC
  Administered 2015-03-02: 25 mg via INTRAVENOUS
  Filled 2015-03-02: qty 1

## 2015-03-02 MED ORDER — ONDANSETRON 4 MG PO TBDP: ORAL_TABLET | ORAL | Status: DC

## 2015-03-02 NOTE — Discharge Instructions (Signed)
There does not appear to be lethargic cause her symptoms at this time. You reported feeling much better after administration of medications and IV fluids in the ED. It is important to stay well-hydrated at home and drink plenty of water. Please follow-up with your doctor in the next 2-3 days for reevaluation. Follow up with an optometrist for a formal eye exam. Return to ED for any new or worsening symptoms.  Dizziness Dizziness is a common problem. It is a feeling of unsteadiness or light-headedness. You may feel like you are about to faint. Dizziness can lead to injury if you stumble or fall. Anyone can become dizzy, but dizziness is more common in older adults. This condition can be caused by a number of things, including medicines, dehydration, or illness. HOME CARE INSTRUCTIONS Taking these steps may help with your condition: Eating and Drinking  Drink enough fluid to keep your urine clear or pale yellow. This helps to keep you from becoming dehydrated. Try to drink more clear fluids, such as water.  Do not drink alcohol.  Limit your caffeine intake if directed by your health care provider.  Limit your salt intake if directed by your health care provider. Activity  Avoid making quick movements.  Rise slowly from chairs and steady yourself until you feel okay.  In the morning, first sit up on the side of the bed. When you feel okay, stand slowly while you hold onto something until you know that your balance is fine.  Move your legs often if you need to stand in one place for a long time. Tighten and relax your muscles in your legs while you are standing.  Do not drive or operate heavy machinery if you feel dizzy.  Avoid bending down if you feel dizzy. Place items in your home so that they are easy for you to reach without leaning over. Lifestyle  Do not use any tobacco products, including cigarettes, chewing tobacco, or electronic cigarettes. If you need help quitting, ask your  health care provider.  Try to reduce your stress level, such as with yoga or meditation. Talk with your health care provider if you need help. General Instructions  Watch your dizziness for any changes.  Take medicines only as directed by your health care provider. Talk with your health care provider if you think that your dizziness is caused by a medicine that you are taking.  Tell a friend or a family member that you are feeling dizzy. If he or she notices any changes in your behavior, have this person call your health care provider.  Keep all follow-up visits as directed by your health care provider. This is important. SEEK MEDICAL CARE IF:  Your dizziness does not go away.  Your dizziness or light-headedness gets worse.  You feel nauseous.  You have reduced hearing.  You have new symptoms.  You are unsteady on your feet or you feel like the room is spinning. SEEK IMMEDIATE MEDICAL CARE IF:  You vomit or have diarrhea and are unable to eat or drink anything.  You have problems talking, walking, swallowing, or using your arms, hands, or legs.  You feel generally weak.  You are not thinking clearly or you have trouble forming sentences. It may take a friend or family member to notice this.  You have chest pain, abdominal pain, shortness of breath, or sweating.  Your vision changes.  You notice any bleeding.  You have a headache.  You have neck pain or a stiff neck.  You have a fever.   This information is not intended to replace advice given to you by your health care provider. Make sure you discuss any questions you have with your health care provider.   Document Released: 10/05/2000 Document Revised: 08/26/2014 Document Reviewed: 04/07/2014 Elsevier Interactive Patient Education 2016 [employer]  General Headache Without Cause A headache is pain or discomfort felt around the head or neck area. The specific cause of a headache may not be found. There are many  causes and types of headaches. A few common ones are:  Tension headaches.  Migraine headaches.  Cluster headaches.  Chronic daily headaches. HOME CARE INSTRUCTIONS  Watch your condition for any changes. Take these steps to help with your condition: Managing Pain  Take over-the-counter and prescription medicines only as told by your health care provider.  Lie down in a dark, quiet room when you have a headache.  If directed, apply ice to the head and neck area:  Put ice in a plastic bag.  Place a towel between your skin and the bag.  Leave the ice on for 20 minutes, 2-3 times per day.  Use a heating pad or hot shower to apply heat to the head and neck area as told by your health care provider.  Keep lights dim if bright lights bother you or make your headaches worse. Eating and Drinking  Eat meals on a regular schedule.  Limit alcohol use.  Decrease the amount of caffeine you drink, or stop drinking caffeine. General Instructions  Keep all follow-up visits as told by your health care provider. This is important.  Keep a headache journal to help find out what may trigger your headaches. For example, write down:  What you eat and drink.  How much sleep you get.  Any change to your diet or medicines.  Try massage or other relaxation techniques.  Limit stress.  Sit up straight, and do not tense your muscles.  Do not use tobacco products, including cigarettes, chewing tobacco, or e-cigarettes. If you need help quitting, ask your health care provider.  Exercise regularly as told by your health care provider.  Sleep on a regular schedule. Get 7-9 hours of sleep, or the amount recommended by your health care provider. SEEK MEDICAL CARE IF:   Your symptoms are not helped by medicine.  You have a headache that is different from the usual headache.  You have nausea or you vomit.  You have a fever. SEEK IMMEDIATE MEDICAL CARE IF:   Your headache becomes  severe.  You have repeated vomiting.  You have a stiff neck.  You have a loss of vision.  You have problems with speech.  You have pain in the eye or ear.  You have muscular weakness or loss of muscle control.  You lose your balance or have trouble walking.  You feel faint or pass out.  You have confusion.   This information is not intended to replace advice given to you by your health care provider. Make sure you discuss any questions you have with your health care provider.   Document Released: 04/11/2005 Document Revised: 12/31/2014 Document Reviewed: 08/04/2014 Elsevier Interactive Patient Education [employer]

## 2015-03-02 NOTE — ED Provider Notes (Signed)
CSN: [id number]     Arrival date & time 03/01/15  2248 History   First MD Initiated Contact with Patient 03/01/15 2347     Chief Complaint  Patient presents with  . Migraine  . Blurred Vision  . Dizziness     (Consider location/radiation/quality/duration/timing/severity/associated sxs/prior Treatment) HPI Laura Montoya is a 29 y.o. female who comes in for evaluation of headache, blurred vision and dizziness. Patient reports she typically has migraines in the frontal aspect of her head. She reports since Monday she has had a pressure in the back of her neck that radiates into her bilateral shoulders. She denies any neck stiffness, fevers, rash. She reports that when she gets up from a lying or sitting position she will become dizzy and sometimes the sensation. When she starts walking and she will "feel like I'm on a ship". She was seen by her PCP and prescribed doxycycline for a presumed sinus infection. She reports being unable to keep his medication down and has been throwing up since then area and she reports she has been unable to drink water due to her nausea. Denies any other chest pain, shortness of breath, numbness or weakness, urinary symptoms. Patient is a smoker.  Past Medical History  Diagnosis Date  . Asthma   . Scoliosis   . Schizophrenia [facility])    Past Surgical History  Procedure Laterality Date  . Breast surgery    . Mouth surgery     Family History  Problem Relation Age of Onset  . Thyroid disease Mother   . Mental illness Father    Social History  Substance Use Topics  . Smoking status: Current Every Day Smoker    Types: Cigarettes  . Smokeless tobacco: None  . Alcohol Use: No   OB History    No data available     Review of Systems A 10 point review of systems was completed and was negative except for pertinent positives and negatives as mentioned in the history of present illness     Allergies  Lavender oil  Home Medications   Prior to  Admission medications   Medication Sig Start Date End Date Taking? Authorizing Provider  acetaminophen (TYLENOL) 500 MG tablet Take 1,000 mg by mouth every 6 (six) hours as needed (for pain.).   Yes Historical Provider, MD  albuterol (PROVENTIL HFA;VENTOLIN HFA) 108 (90 BASE) MCG/ACT inhaler Inhale 2 puffs into the lungs every 6 (six) hours as needed. For shortness of breath   Yes Historical Provider, MD  ARIPiprazole (ABILIFY) 5 MG tablet Take 5 mg by mouth daily.   Yes Historical Provider, MD  clonazePAM (KLONOPIN) 0.5 MG tablet Take 0.25 mg by mouth 2 (two) times daily as needed for anxiety.   Yes Historical Provider, MD  diphenhydrAMINE (BENADRYL) 25 mg capsule Take 50 mg by mouth every 6 (six) hours as needed. For ithcing   Yes Historical Provider, MD  doxycycline (VIBRA-TABS) 100 MG tablet Take 100 mg by mouth 2 (two) times daily.   Yes Historical Provider, MD  tiZANidine (ZANAFLEX) 4 MG tablet Take 1 tablet (4 mg total) by mouth every 6 (six) hours as needed for muscle spasms. 11/12/14  Yes Ranelle Oyster, MD  traMADol (ULTRAM) 50 MG tablet Take 50 mg by mouth every 6 (six) hours as needed (for pain.).   Yes Historical Provider, MD  venlafaxine XR (EFFEXOR-XR) 75 MG 24 hr capsule Take 75 mg by mouth daily.   Yes Historical Provider, MD  ondansetron (ZOFRAN ODT)  4 MG disintegrating tablet  ODT q4 hours prn nausea/vomit 03/02/15   Tylena Prisk, PA-C   BP 90/52 mmHg  Pulse 56  Temp(Src) 98.4 F (36.9 C) (Oral)  Resp 20  Ht  (1.575 m)  Wt 146 lb (66.225 kg)  BMI 26.70 kg/m2  SpO2 100% Physical Exam  Constitutional: She is oriented to person, place, and time. She appears well-developed and well-nourished.  HENT:  Head: Normocephalic and atraumatic.  Mouth/Throat: Oropharynx is clear and moist.  Eyes: Conjunctivae are normal. Pupils are equal, round, and reactive to light. Right eye exhibits no discharge. Left eye exhibits no discharge. No scleral icterus.  Extraocular  movements intact without nystagmus.  Neck: Neck supple.  No meningismus or nuchal rigidity  Cardiovascular: Normal rate, regular rhythm and normal heart sounds.   Pulmonary/Chest: Effort normal and breath sounds normal. No respiratory distress. She has no wheezes. She has no rales.  Abdominal: Soft. There is no tenderness.  Musculoskeletal: She exhibits no tenderness.  Neurological: She is alert and oriented to person, place, and time.  Cranial Nerves II-XII grossly intact. Motor strength is 5/5 in all 4 extremities. Sensation intact to light touch. Completes finger to nose coordination movements without difficulty. No pronator drift. Gait is baseline.  Skin: Skin is warm and dry. No rash noted.  Psychiatric: She has a normal mood and affect.  Nursing note and vitals reviewed.   ED Course  Procedures (including critical care time) Labs Review Labs Reviewed - No data to display  Imaging Review No results found. I have personally reviewed and evaluated these images and lab results as part of my medical decision-making.   EKG Interpretation None     Meds given in ED:  Medications  ketorolac (TORADOL) 30 MG/ML injection 30 mg (30 mg Intravenous Given 03/02/15 0017)  diphenhydrAMINE (BENADRYL) injection 25 mg (25 mg Intravenous Given 03/02/15 0017)  metoCLOPramide (REGLAN) injection 10 mg (10 mg Intravenous Given 03/02/15 0016)  sodium chloride 0.9 % bolus 1,000 mL (0 mLs Intravenous Stopped 03/02/15 0149)  sodium chloride 0.9 % bolus 1,000 mL (0 mLs Intravenous Stopped 03/02/15 0115)    Discharge Medication List as of 03/02/2015  1:15 AM    START taking these medications   Details  ondansetron (ZOFRAN ODT) 4 MG disintegrating tablet  ODT q4 hours prn nausea/vomit, Print       Filed Vitals:   03/01/15 2252 03/02/15 0115  BP: 119/70 90/52  Pulse: 87 56  Temp: 98.4 F (36.9 C)   TempSrc: Oral   Resp: 19 20  Height:  (1.575 m)   Weight: 146 lb (66.225 kg)   SpO2: 98%  100%     Visual Acuity  Right Eye Distance:   Left Eye Distance:   Bilateral Distance:    Right Eye Near: R Near: 20/40 Left Eye Near:  L Near: 20/40 Bilateral Near:  20/30   MDM  Laura Montoya is a 29 y.o. female who presents for evaluation of headache, blurred vision, dizziness. The symptoms have been intermittent and ongoing since Monday. Worse with standing and certain head movements. Patient reports decreased fluid intake secondary to nausea. No other numbness or weakness.  Vitals stable, afebrile. Suspect recent documentation of hypotension is erroneous. Patient appears well and is recording could possibly be due to patient sleeping. Feels better after IV fluids and analgesia in ED. Reports headache has resolved Normal neurological exam. No dizziness. Gait baseline. HA gradual in onset, progressively worsening.  No hx of traumatic  injury No hx of clotting disorder, peripartum or postpartum state, Immunocompromise Doubt Meningitis, CVA/SAH/ICH, Dural Venous Thrombosis, Seizure, Central Lesion or Tumor, Giant Cell Arteritis, Cervical/Carotid or other vascular dissection. I have personally reviewed all labs, imaging, nursing/prvious notes during the patient's evaluation in the ED today. No evidence of other acute or emergent pathology that requires immediate intervention at this time. Pt stable, in good condition and is appropriate for discharge. DC with Zofran and instructions to follow up with PCP within 48 hrs for further evaluation and management of symptoms. Also discussed she would benefit from formal eye exam from optometrist. Prior to patient discharge, I discussed and reviewed this case with Dr. Ranae PalmsYelverton   Final diagnoses:  Nonintractable headache, unspecified chronicity pattern, unspecified headache type  Dizziness       Joycie PeekBenjamin Aalyiah Camberos, PA-C 03/02/15 1625  Loren Raceravid Yelverton, MD 03/10/15 [phone]

## 2015-03-04 ENCOUNTER — Encounter: Payer: Medicaid Other | Attending: Physical Medicine & Rehabilitation | Admitting: Physical Medicine & Rehabilitation

## 2015-03-04 ENCOUNTER — Encounter: Payer: Self-pay | Admitting: Physical Medicine & Rehabilitation

## 2015-03-04 VITALS — BP 111/63 | HR 74 | Resp 14

## 2015-03-04 DIAGNOSIS — H8113 Benign paroxysmal vertigo, bilateral: Secondary | ICD-10-CM | POA: Insufficient documentation

## 2015-03-04 DIAGNOSIS — M419 Scoliosis, unspecified: Secondary | ICD-10-CM | POA: Insufficient documentation

## 2015-03-04 DIAGNOSIS — F200 Paranoid schizophrenia: Secondary | ICD-10-CM | POA: Insufficient documentation

## 2015-03-04 DIAGNOSIS — M129 Arthropathy, unspecified: Secondary | ICD-10-CM | POA: Insufficient documentation

## 2015-03-04 DIAGNOSIS — F329 Major depressive disorder, single episode, unspecified: Secondary | ICD-10-CM | POA: Diagnosis not present

## 2015-03-04 DIAGNOSIS — R42 Dizziness and giddiness: Secondary | ICD-10-CM | POA: Insufficient documentation

## 2015-03-04 DIAGNOSIS — H8110 Benign paroxysmal vertigo, unspecified ear: Secondary | ICD-10-CM | POA: Insufficient documentation

## 2015-03-04 DIAGNOSIS — J45909 Unspecified asthma, uncomplicated: Secondary | ICD-10-CM | POA: Insufficient documentation

## 2015-03-04 DIAGNOSIS — M545 Low back pain: Secondary | ICD-10-CM | POA: Diagnosis present

## 2015-03-04 DIAGNOSIS — F419 Anxiety disorder, unspecified: Secondary | ICD-10-CM | POA: Insufficient documentation

## 2015-03-04 DIAGNOSIS — Z9889 Other specified postprocedural states: Secondary | ICD-10-CM | POA: Diagnosis not present

## 2015-03-04 DIAGNOSIS — G8929 Other chronic pain: Secondary | ICD-10-CM | POA: Insufficient documentation

## 2015-03-04 DIAGNOSIS — F17200 Nicotine dependence, unspecified, uncomplicated: Secondary | ICD-10-CM | POA: Diagnosis not present

## 2015-03-04 MED ORDER — MECLIZINE HCL 32 MG PO TABS: 32.0000 mg | ORAL_TABLET | Freq: Three times a day (TID) | ORAL | Status: DC

## 2015-03-04 NOTE — Patient Instructions (Signed)
Benign Positional Vertigo Vertigo is the feeling that you or your surroundings are moving when they are not. Benign positional vertigo is the most common form of vertigo. The cause of this condition is not serious (is benign). This condition is triggered by certain movements and positions (is positional). This condition can be dangerous if it occurs while you are doing something that could endanger you or others, such as driving.  CAUSES In many cases, the cause of this condition is not known. It may be caused by a disturbance in an area of the inner ear that helps your brain to sense movement and balance. This disturbance can be caused by a viral infection (labyrinthitis), head injury, or repetitive motion. RISK FACTORSPlace vertigo/dizziness patient instructions here.  This condition is more likely to develop in:  Women.  People who are 31 years of age or older. SYMPTOMS Symptoms of this condition usually happen when you move your head or your eyes in different directions. Symptoms may start suddenly, and they usually last for less than a minute. Symptoms may include:  Loss of balance and falling.  Feeling like you are spinning or moving.  Feeling like your surroundings are spinning or moving.  Nausea and vomiting.  Blurred vision.  Dizziness.  Involuntary eye movement (nystagmus). Symptoms can be mild and cause only slight annoyance, or they can be severe and interfere with daily life. Episodes of benign positional vertigo may return (recur) over time, and they may be triggered by certain movements. Symptoms may improve over time. DIAGNOSIS This condition is usually diagnosed by medical history and a physical exam of the head, neck, and ears. You may be referred to a health care provider who specializes in ear, nose, and throat (ENT) problems (otolaryngologist) or a provider who specializes in disorders of the nervous system (neurologist). You may have additional testing,  including:  MRI.  A CT scan.  Eye movement tests. Your health care provider may ask you to change positions quickly while he or she watches you for symptoms of benign positional vertigo, such as nystagmus. Eye movement may be tested with an electronystagmogram (ENG), caloric stimulation, the Dix-Hallpike test, or the roll test.  An electroencephalogram (EEG). This records electrical activity in your brain.  Hearing tests. TREATMENT Usually, your health care provider will treat this by moving your head in specific positions to adjust your inner ear back to normal. Surgery may be needed in severe cases, but this is rare. In some cases, benign positional vertigo may resolve on its own in 2-4 weeks. HOME CARE INSTRUCTIONS Safety  Move slowly.Avoid sudden body or head movements.  Avoid driving.  Avoid operating heavy machinery.  Avoid doing any tasks that would be dangerous to you or others if a vertigo episode would occur.  If you have trouble walking or keeping your balance, try using a cane for stability. If you feel dizzy or unstable, sit down right away.  Return to your normal activities as told by your health care provider. Ask your health care provider what activities are safe for you. General Instructions  Take over-the-counter and prescription medicines only as told by your health care provider.  Avoid certain positions or movements as told by your health care provider.  Drink enough fluid to keep your urine clear or pale yellow.  Keep all follow-up visits as told by your health care provider. This is important. SEEK MEDICAL CARE IF:  You have a fever.  Your condition gets worse or you develop new symptoms.  Your family or friends notice any behavioral changes.  Your nausea or vomiting gets worse.  You have numbness or a "pins and needles" sensation. SEEK IMMEDIATE MEDICAL CARE IF:  You have difficulty speaking or moving.  You are always dizzy.  You  faint.  You develop severe headaches.  You have weakness in your legs or arms.  You have changes in your hearing or vision.  You develop a stiff neck.  You develop sensitivity to light.   This information is not intended to replace advice given to you by your health care provider. Make sure you discuss any questions you have with your health care provider.   Document Released: 01/17/2006 Document Revised: 12/31/2014 Document Reviewed: 08/04/2014 Elsevier Interactive Patient Education [employer]

## 2015-03-04 NOTE — Progress Notes (Signed)
Subjective:    Patient ID: Laura Montoya, female    DOB: 11-21-1985, 29 y.o.   MRN: [id number]  HPI  [name] is here in follow up of her chronic pain. She states that she has had some issues with migraines recently. She was suffering from a sinus infection as well as dehydration over the last 2 weeks when her symptoms started. She was given antibiotics, fluids, as well as klonopin. She has had ongoing blurred vision without any obvious cause. She has reported some numbness in her "lips" and occasionally in the hands. I reviewed her work up in the [facility] ED which was unremarkable. She tells me that her primary is going to set her up with an optho appointment.    Pain Inventory Average Pain 6 Pain Right Now 8 My pain is constant and sharp  In the last 24 hours, has pain interfered with the following? General activity 5 Relation with others 6 Enjoyment of life 7 What TIME of day is your pain at its worst? evening, night  Sleep (in general) Poor  Pain is worse with: walking, sitting and some activites Pain improves with: heat/ice, pacing activities, medication and injections Relief from Meds: 3  Mobility walk without assistance walk with assistance use a cane how many minutes can you walk? 20 ability to climb steps?  yes do you drive?  yes Do you have any goals in this area?  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  no  Neuro/Psych numbness spasms dizziness depression anxiety  Prior Studies Any changes since last visit?  no  Physicians involved in your care Any changes since last visit?  no   Family History  Problem Relation Age of Onset  . Thyroid disease Mother   . Mental illness Father    Social History   Social History  . Marital Status: Single    Spouse Name: N/A  . Number of Children: N/A  .  Years of Education: N/A   Social History Main Topics  . Smoking status: Current Every Day Smoker    Types: Cigarettes  . Smokeless tobacco: None  . Alcohol Use: No  . Drug Use: No  . Sexual Activity: Not Asked   Other Topics Concern  . None   Social History Narrative   Past Surgical History  Procedure Laterality Date  . Breast surgery    . Mouth surgery     Past Medical History  Diagnosis Date  . Asthma   . Scoliosis   . Schizophrenia (HCC)    BP 111/63 mmHg  Pulse 74  Resp 14  SpO2 100%  Opioid Risk Score:   Fall Risk Score:  `1  Depression screen PHQ 2/9  Depression screen PHQ 2/9 11/12/2014  Decreased Interest 1  Down, Depressed, Hopeless 0  PHQ - 2 Score 1     Review of Systems  Constitutional: Positive for appetite change and unexpected weight change.  Respiratory: Positive for wheezing.   Musculoskeletal:       Spasms   Neurological: Positive for dizziness and numbness.  Psychiatric/Behavioral: Positive for dysphoric mood. The patient is nervous/anxious.   All other systems reviewed and are negative.      Objective:   Physical Exam  General: Alert and oriented x 3, No apparent distress  HEENT: Head is normocephalic, atraumatic, PERRLA, EOMI, sclera anicteric,  oral mucosa pink and moist, edentulous, ext ear canals clear,  Neck: Supple without JVD or lymphadenopathy  Heart: Reg rate and rhythm. No murmurs rubs or gallops  Chest: CTA bilaterally without wheezes, rales, or rhonchi; no distress  Abdomen: Soft, non-tender, non-distended, bowel sounds positive.  Extremities: No clubbing, cyanosis, or edema. Pulses are 2+  Skin: Clean and intact without signs of breakdown. Numerous tattoos  Neuro: 5-6 beats of nystagmus with lateral gaze to right, 1-2 beats with movement to the left. Lost balance when rising from seated to standing.  Pt is cognitively appropriate with normal insight, memory, and awareness. Cranial nerves 2-12 are intact. Sensory exam is  normal. Reflexes are 2+ in all 4's. Fine motor coordination is intact. No tremors. Motor function is grossly 5/5. Slight intentional tremor.  Musculoskeletal: improved posture, no palpable trigger points. Left rhomboids and paraspinals along the scapular border less taut and painful to touch. near normal cervical ROM with some pain in right cervical bending and rotation. She was able to touch the floor with minimal effort.  Psych: Pt's affect is appropriate. Pt is cooperative   Assessment & Plan:   1. Chronic low back pain, SIJ pain, mild facet arthropathy  2. Myofascial back pain particularly in the left shoulder girdle. ?FMS  3. Osteopenia?  4. Hx of paranoid schizophrenia which appears under control.  5. Benign paroxysmal positional vertigo likely triggered by recent sinus congestion   Plan:  1. Continue naproxen in the evening with food .  2. Ongoing stretching, stress mgt, heat therapy, maintain psych follow up  3. Meclizine 25mg  TID for 3 days then prn thereafter. Information was provided on vertigo today also. Advised adequate sleep, nutrition. If symptoms persist she needs to contact her primary. Probably a good idea to follow through with optho appt if visual acuity remains a problem.  4. Zanaflex to 4mg  q8 prn----#75 --no RF today 5. Ongoing HEP  6. 20 minutes of face to face patient care time were spent during this visit. All questions were encouraged and answered. Follow up with me in 2 months.

## 2015-03-06 ENCOUNTER — Telehealth: Payer: Self-pay | Admitting: Physical Medicine & Rehabilitation

## 2015-03-06 NOTE — Telephone Encounter (Signed)
Clarification called in to pharmacy. Patient advised. [facility]

## 2015-03-06 NOTE — Telephone Encounter (Signed)
Patient's pharmacy never received her prescription for Meclizine.  I called [facility] and they said it does not come in a 32mg , it only comes in 12 1/2 or 25mg  tab.  Please call patient when this is completed.

## 2015-03-11 ENCOUNTER — Ambulatory Visit: Payer: Medicaid Other | Admitting: Physical Medicine & Rehabilitation

## 2015-03-24 ENCOUNTER — Other Ambulatory Visit: Payer: Self-pay | Admitting: Physician Assistant

## 2015-03-24 DIAGNOSIS — R519 Headache, unspecified: Secondary | ICD-10-CM

## 2015-03-24 DIAGNOSIS — R51 Headache: Principal | ICD-10-CM

## 2015-04-04 ENCOUNTER — Ambulatory Visit
Admission: RE | Admit: 2015-04-04 | Discharge: 2015-04-04 | Disposition: A | Discharge status: Home / Self Care | Payer: Medicaid Other | Source: Ambulatory Visit | Attending: Physician Assistant | Admitting: Physician Assistant

## 2015-04-04 DIAGNOSIS — R519 Headache, unspecified: Secondary | ICD-10-CM

## 2015-04-04 DIAGNOSIS — R51 Headache: Principal | ICD-10-CM

## 2015-04-23 ENCOUNTER — Encounter (HOSPITAL_COMMUNITY): Payer: Self-pay | Admitting: Emergency Medicine

## 2015-04-23 ENCOUNTER — Emergency Department (HOSPITAL_COMMUNITY)
Admission: EM | Admit: 2015-04-23 | Discharge: 2015-04-23 | Disposition: A | Discharge status: Home / Self Care | Payer: Medicaid Other | Attending: Emergency Medicine | Admitting: Emergency Medicine

## 2015-04-23 DIAGNOSIS — M419 Scoliosis, unspecified: Secondary | ICD-10-CM | POA: Insufficient documentation

## 2015-04-23 DIAGNOSIS — J45909 Unspecified asthma, uncomplicated: Secondary | ICD-10-CM | POA: Diagnosis not present

## 2015-04-23 DIAGNOSIS — F209 Schizophrenia, unspecified: Secondary | ICD-10-CM | POA: Diagnosis not present

## 2015-04-23 DIAGNOSIS — R112 Nausea with vomiting, unspecified: Secondary | ICD-10-CM | POA: Diagnosis present

## 2015-04-23 DIAGNOSIS — K529 Noninfective gastroenteritis and colitis, unspecified: Secondary | ICD-10-CM | POA: Insufficient documentation

## 2015-04-23 DIAGNOSIS — Z79899 Other long term (current) drug therapy: Secondary | ICD-10-CM | POA: Insufficient documentation

## 2015-04-23 DIAGNOSIS — Z3202 Encounter for pregnancy test, result negative: Secondary | ICD-10-CM | POA: Diagnosis not present

## 2015-04-23 DIAGNOSIS — F17210 Nicotine dependence, cigarettes, uncomplicated: Secondary | ICD-10-CM | POA: Insufficient documentation

## 2015-04-23 LAB — URINALYSIS, ROUTINE W REFLEX MICROSCOPIC
BILIRUBIN URINE: NEGATIVE
Glucose, UA: NEGATIVE mg/dL
HGB URINE DIPSTICK: NEGATIVE
Leukocytes, UA: NEGATIVE
NITRITE: NEGATIVE
PROTEIN: NEGATIVE mg/dL
Specific Gravity, Urine: 1.011 (ref 1.005–1.030)
pH: 5.5 (ref 5.0–8.0)

## 2015-04-23 LAB — COMPREHENSIVE METABOLIC PANEL
ALBUMIN: 4.6 g/dL (ref 3.5–5.0)
ALT: 30 U/L (ref 14–54)
ANION GAP: 22 — AB (ref 5–15)
AST: 61 U/L — ABNORMAL HIGH (ref 15–41)
Alkaline Phosphatase: 102 U/L (ref 38–126)
BUN: 6 mg/dL (ref 6–20)
CALCIUM: 9.9 mg/dL (ref 8.9–10.3)
CHLORIDE: 90 mmol/L — AB (ref 101–111)
CO2: 16 mmol/L — AB (ref 22–32)
CREATININE: 0.9 mg/dL (ref 0.44–1.00)
GFR calc non Af Amer: >60 mL/min (ref >60)
GLUCOSE: 104 mg/dL — AB (ref 65–99)
Potassium: 3.5 mmol/L (ref 3.5–5.1)
SODIUM: 128 mmol/L — AB (ref 135–145)
TOTAL PROTEIN: 8.3 g/dL — AB (ref 6.5–8.1)
Total Bilirubin: 1.1 mg/dL (ref 0.3–1.2)

## 2015-04-23 LAB — CBC
HCT: 38.6 % (ref 36.0–46.0)
Hemoglobin: 14.4 g/dL (ref 12.0–15.0)
MCH: 34 pg (ref 26.0–34.0)
MCHC: 37.3 g/dL — AB (ref 30.0–36.0)
MCV: 91.3 fL (ref 78.0–100.0)
PLATELETS: 440 10*3/uL — AB (ref 150–400)
RBC: 4.23 MIL/uL (ref 3.87–5.11)
RDW: 12 % (ref 11.5–15.5)
WBC: 12.4 10*3/uL — ABNORMAL HIGH (ref 4.0–10.5)

## 2015-04-23 LAB — HCG, QUANTITATIVE, PREGNANCY: hCG, Beta Chain, Quant, S: <1 m[IU]/mL (ref <5)

## 2015-04-23 LAB — I-STAT CG4 LACTIC ACID, ED: Lactic Acid, Venous: 2.01 mmol/L (ref 0.5–2.0)

## 2015-04-23 LAB — BASIC METABOLIC PANEL
Anion gap: 16 — ABNORMAL HIGH (ref 5–15)
BUN: <5 mg/dL — ABNORMAL LOW (ref 6–20)
CO2: 16 mmol/L — ABNORMAL LOW (ref 22–32)
Calcium: 8.8 mg/dL — ABNORMAL LOW (ref 8.9–10.3)
Chloride: 105 mmol/L (ref 101–111)
Creatinine, Ser: 0.73 mg/dL (ref 0.44–1.00)
GFR calc Af Amer: >60 mL/min (ref >60)
GFR calc non Af Amer: >60 mL/min (ref >60)
Glucose, Bld: 93 mg/dL (ref 65–99)
Potassium: 3.8 mmol/L (ref 3.5–5.1)
Sodium: 137 mmol/L (ref 135–145)

## 2015-04-23 LAB — I-STAT BETA HCG BLOOD, ED (MC, WL, AP ONLY): HCG, QUANTITATIVE: 10 m[IU]/mL — AB (ref <5)

## 2015-04-23 LAB — LIPASE, BLOOD: LIPASE: 19 U/L (ref 11–51)

## 2015-04-23 MED ORDER — PROMETHAZINE HCL 25 MG/ML IJ SOLN
25.0000 mg | Freq: Once | INTRAMUSCULAR | Status: AC
  Administered 2015-04-23: 25 mg via INTRAVENOUS
  Filled 2015-04-23: qty 1

## 2015-04-23 MED ORDER — ONDANSETRON HCL 4 MG/2ML IJ SOLN
4.0000 mg | Freq: Once | INTRAMUSCULAR | Status: AC | PRN
  Administered 2015-04-23: 4 mg via INTRAVENOUS
  Filled 2015-04-23: qty 2

## 2015-04-23 MED ORDER — SODIUM CHLORIDE 0.9 % IV BOLUS (SEPSIS)
1000.0000 mL | Freq: Once | INTRAVENOUS | Status: AC
  Administered 2015-04-23: 1000 mL via INTRAVENOUS

## 2015-04-23 MED ORDER — ONDANSETRON HCL 4 MG PO TABS: 4.0000 mg | ORAL_TABLET | Freq: Four times a day (QID) | ORAL | Status: DC

## 2015-04-23 MED ORDER — ONDANSETRON HCL 4 MG/2ML IJ SOLN
4.0000 mg | Freq: Once | INTRAMUSCULAR | Status: AC
  Administered 2015-04-23: 4 mg via INTRAVENOUS
  Filled 2015-04-23: qty 2

## 2015-04-23 NOTE — ED Notes (Addendum)
Pt c/o emesis, diarrhea onset 3 days ago, hematemesis with small amount of red blood onset today. Generalized pain due to chronic pain. Menstrual period a few days late.

## 2015-04-23 NOTE — ED Notes (Signed)
EDP at bedside  

## 2015-04-23 NOTE — ED Provider Notes (Signed)
CSN: [id number]     Arrival date & time 04/23/15  0902 History   First MD Initiated Contact with Patient 04/23/15 [phone]     No chief complaint on file.   HPI   29 year old female presents today with complaints of nausea vomiting and diarrhea. Patient reports symptoms started 4 days ago with nausea and diarrhea, reports yesterday she started having vomiting. She reports several episodes of vomiting throughout the evening, nonbloody. She states she has been unable to tolerate by mouth throughout the evening. Patient denies any fever, chills, chest pain shortness of breath, abdominal pain, or bloody diarrhea. Patient denies any exposure to abnormal food or drink, she does report that her daughter was sick with the stomach bug several days prior to her symptom onset. Patient reports her LMP was 03/17/2015, has not been sexually active, denies any vaginal bleeding or discharge, no changes to the color clarity or characteristics or urine.   Past Medical History  Diagnosis Date  . Asthma   . Scoliosis   . Schizophrenia [facility])    Past Surgical History  Procedure Laterality Date  . Breast surgery    . Mouth surgery     Family History  Problem Relation Age of Onset  . Thyroid disease Mother   . Mental illness Father    Social History  Substance Use Topics  . Smoking status: Current Every Day Smoker    Types: Cigarettes  . Smokeless tobacco: None  . Alcohol Use: No   OB History    No data available     Review of Systems  All other systems reviewed and are negative.   Allergies  Lavender oil  Home Medications   Prior to Admission medications   Medication Sig Start Date End Date Taking? Authorizing Provider  acetaminophen (TYLENOL) 500 MG tablet Take 1,000 mg by mouth every 6 (six) hours as needed (for pain.).   Yes Historical Provider, MD  albuterol (PROVENTIL HFA;VENTOLIN HFA) 108 (90 BASE) MCG/ACT inhaler Inhale 2 puffs into the lungs every 6 (six) hours as needed. For shortness  of breath   Yes Historical Provider, MD  ARIPiprazole (ABILIFY) 5 MG tablet Take 5 mg by mouth daily.   Yes Historical Provider, MD  clonazePAM (KLONOPIN) 0.5 MG tablet Take 0.25 mg by mouth 2 (two) times daily as needed for anxiety.   Yes Historical Provider, MD  diphenhydrAMINE (BENADRYL) 25 mg capsule Take 50 mg by mouth every 6 (six) hours as needed. For ithcing   Yes Historical Provider, MD  gabapentin (NEURONTIN) 300 MG capsule Take 300 mg by mouth 2 (two) times daily.   Yes Historical Provider, MD  meclizine (ANTIVERT) 32 MG tablet Take 1 tablet (32 mg total) by mouth 3 (three) times daily. For 3 days scheduled then as needed thereafter Patient taking differently: Take 32 mg by mouth 3 (three) times daily as needed for dizziness.  03/04/15  Yes Ranelle Oyster, MD  naproxen sodium (ANAPROX) 220 MG tablet Take 440 mg by mouth 2 (two) times daily as needed (for pain).   Yes Historical Provider, MD  ondansetron (ZOFRAN ODT) 4 MG disintegrating tablet  ODT q4 hours prn nausea/vomit 03/02/15  Yes Benjamin Cartner, PA-C  tiZANidine (ZANAFLEX) 4 MG tablet Take 1 tablet (4 mg total) by mouth every 6 (six) hours as needed for muscle spasms. 11/12/14  Yes Ranelle Oyster, MD  traMADol (ULTRAM) 50 MG tablet Take 50 mg by mouth every 6 (six) hours as needed (for pain.).   Yes  Historical Provider, MD  venlafaxine XR (EFFEXOR-XR) 75 MG 24 hr capsule Take 75 mg by mouth daily.   Yes Historical Provider, MD  ondansetron (ZOFRAN) 4 MG tablet Take 1 tablet (4 mg total) by mouth every 6 (six) hours. 04/23/15   Jibri Schriefer, PA-C   BP 122/68 mmHg  Pulse 91  Temp(Src) 98.2 F (36.8 C) (Oral)  Resp 17  SpO2 100%   Physical Exam  Constitutional: She is oriented to person, place, and time. She appears well-developed and well-nourished.  HENT:  Head: Normocephalic and atraumatic.  Eyes: Conjunctivae are normal. Pupils are equal, round, and reactive to light. Right eye exhibits no discharge. Left eye  exhibits no discharge. No scleral icterus.  Neck: Normal range of motion. No JVD present. No tracheal deviation present.  Cardiovascular: Regular rhythm, normal heart sounds and intact distal pulses.  Exam reveals no gallop and no friction rub.   No murmur heard. Pulmonary/Chest: Effort normal and breath sounds normal. No stridor. No respiratory distress. She has no wheezes. She has no rales. She exhibits no tenderness.  Abdominal: Soft. She exhibits no distension and no mass. There is no tenderness. There is no rebound and no guarding.  Increased bowel sounds  Musculoskeletal: Normal range of motion. She exhibits no edema or tenderness.  Neurological: She is alert and oriented to person, place, and time. Coordination normal.  Skin: Skin is warm and dry. No rash noted. No erythema. No pallor.  Psychiatric: She has a normal mood and affect. Her behavior is normal. Judgment and thought content normal.  Nursing note and vitals reviewed.   ED Course  Procedures (including critical care time) Labs Review Labs Reviewed  COMPREHENSIVE METABOLIC PANEL - Abnormal; Notable for the following:    Sodium 128 (*)    Chloride 90 (*)    CO2 16 (*)    Glucose, Bld 104 (*)    Total Protein 8.3 (*)    AST 61 (*)    Anion gap 22 (*)    All other components within normal limits  CBC - Abnormal; Notable for the following:    WBC 12.4 (*)    MCHC 37.3 (*)    Platelets 440 (*)    All other components within normal limits  URINALYSIS, ROUTINE W REFLEX MICROSCOPIC (NOT AT [facility]) - Abnormal; Notable for the following:    APPearance CLOUDY (*)    Ketones, ur >80 (*)    All other components within normal limits  BASIC METABOLIC PANEL - Abnormal; Notable for the following:    CO2 16 (*)    BUN <5 (*)    Calcium 8.8 (*)    Anion gap 16 (*)    All other components within normal limits  I-STAT BETA HCG BLOOD, ED ([facility], [facility], [facility] ONLY) - Abnormal; Notable for the following:    I-stat hCG, quantitative 10.0 (*)     All other components within normal limits  I-STAT CG4 LACTIC ACID, ED - Abnormal; Notable for the following:    Lactic Acid, Venous 2.01 (*)    All other components within normal limits  LIPASE, BLOOD  HCG, QUANTITATIVE, PREGNANCY  POC URINE PREG, ED    Imaging Review No results found. I have personally reviewed and evaluated these images and lab results as part of my medical decision-making.   EKG Interpretation None      MDM   Final diagnoses:  Gastroenteritis    Labs: Urinalysis, hCG Quant, lipase, CMP, CBC- sodium 128, chloride 90, WBC 12.4  Repeat anion gap 16, sodium 137  Imaging:  Consults:  Therapeutics: Zofran, promethazine, normal saline  Discharge Meds: Zofran  Assessment/Plan: 29 year old female presents today with likely gastroenteritis. Patient is afebrile, nontoxic, handling by mouth after antinausea medication here in the ED. Patient has no abdominal pain, requesting discharge home. I informed her that her sodium and chloride were low, and required increased hydration. Patient has no neurological complaints. Patient was lactic acid of 2.0, and anion gap of 22. I informed patient that these were not reassuring laboratory values and that leaving prior to adequate hydration recheck would not be advised. Patient agreed to stay, repeat BMP shows markedly improved anion gap at 16, sodium 137. Patient reports that after the Zofran she was able to tolerate by mouth, but symptoms returned after short period of time. Patient is requesting discharge home. I advised patient that if she continued to have vomiting at home, was unable to tolerate by mouth that she would need to return immediately to the emergency room. Patient verbalizes understanding and agreement for today's plan and had no further questions concerns at time of discharge.            Eyvonne MechanicJeffrey Advay Volante, PA-C 04/23/15 1612  Nelva Nayobert Beaton, MD 04/26/15 2312

## 2015-04-23 NOTE — ED Notes (Signed)
Patient reports vomiting.  EDP made aware

## 2015-04-23 NOTE — Discharge Instructions (Signed)
Please read attached information. If you experience any new or worsening signs or symptoms please return to the emergency room for evaluation. Please follow-up with your primary care provider or specialist as discussed. Please use medication prescribed only as directed and discontinue taking if you have any concerning signs or symptoms.   °

## 2015-04-23 NOTE — ED Notes (Signed)
Patient reports no nausea at present.

## 2015-04-23 NOTE — ED Notes (Signed)
Patient reports nausea.  Requesting nausea medicaiton

## 2015-04-23 NOTE — ED Notes (Signed)
Patient given water for PO challenge per EDP 

## 2015-04-27 ENCOUNTER — Encounter (HOSPITAL_COMMUNITY): Payer: Self-pay | Admitting: Emergency Medicine

## 2015-04-27 ENCOUNTER — Emergency Department (HOSPITAL_COMMUNITY): Payer: Medicaid Other

## 2015-04-27 ENCOUNTER — Emergency Department (HOSPITAL_COMMUNITY)
Admission: EM | Admit: 2015-04-27 | Discharge: 2015-04-28 | Disposition: A | Discharge status: Home / Self Care | Payer: Medicaid Other | Attending: Emergency Medicine | Admitting: Emergency Medicine

## 2015-04-27 DIAGNOSIS — K2100 Gastro-esophageal reflux disease with esophagitis, without bleeding: Secondary | ICD-10-CM

## 2015-04-27 DIAGNOSIS — F209 Schizophrenia, unspecified: Secondary | ICD-10-CM | POA: Insufficient documentation

## 2015-04-27 DIAGNOSIS — Z79899 Other long term (current) drug therapy: Secondary | ICD-10-CM | POA: Diagnosis not present

## 2015-04-27 DIAGNOSIS — F17210 Nicotine dependence, cigarettes, uncomplicated: Secondary | ICD-10-CM | POA: Insufficient documentation

## 2015-04-27 DIAGNOSIS — R000 Tachycardia, unspecified: Secondary | ICD-10-CM | POA: Insufficient documentation

## 2015-04-27 DIAGNOSIS — R1013 Epigastric pain: Secondary | ICD-10-CM

## 2015-04-27 DIAGNOSIS — Z3202 Encounter for pregnancy test, result negative: Secondary | ICD-10-CM | POA: Diagnosis not present

## 2015-04-27 DIAGNOSIS — M419 Scoliosis, unspecified: Secondary | ICD-10-CM | POA: Insufficient documentation

## 2015-04-27 DIAGNOSIS — J45909 Unspecified asthma, uncomplicated: Secondary | ICD-10-CM | POA: Insufficient documentation

## 2015-04-27 NOTE — ED Notes (Signed)
Patient presents for emesis x3 episodes today. Reports seen last Thursday for same, released and has intermittent vomiting since Thursday. Denies diarrhea. Chest pain since Friday, described as "feels like my heart is beating really fast".

## 2015-04-28 ENCOUNTER — Encounter (HOSPITAL_COMMUNITY): Payer: Self-pay

## 2015-04-28 ENCOUNTER — Emergency Department (HOSPITAL_COMMUNITY): Payer: Medicaid Other

## 2015-04-28 LAB — HEPATIC FUNCTION PANEL
ALT: 21 U/L (ref 14–54)
AST: 16 U/L (ref 15–41)
Albumin: 3.8 g/dL (ref 3.5–5.0)
Alkaline Phosphatase: 61 U/L (ref 38–126)
Bilirubin, Direct: <0.1 mg/dL — ABNORMAL LOW (ref 0.1–0.5)
Total Bilirubin: 1.3 mg/dL — ABNORMAL HIGH (ref 0.3–1.2)
Total Protein: 6.7 g/dL (ref 6.5–8.1)

## 2015-04-28 LAB — URINALYSIS, ROUTINE W REFLEX MICROSCOPIC
Bilirubin Urine: NEGATIVE
Glucose, UA: NEGATIVE mg/dL
Hgb urine dipstick: NEGATIVE
Ketones, ur: >80 mg/dL — AB
Leukocytes, UA: NEGATIVE
Nitrite: NEGATIVE
Protein, ur: NEGATIVE mg/dL
Specific Gravity, Urine: 1.01 (ref 1.005–1.030)
pH: 5.5 (ref 5.0–8.0)

## 2015-04-28 LAB — BASIC METABOLIC PANEL WITH GFR
Anion gap: 21 — ABNORMAL HIGH (ref 5–15)
BUN: 6 mg/dL (ref 6–20)
CO2: 13 mmol/L — ABNORMAL LOW (ref 22–32)
Calcium: 9 mg/dL (ref 8.9–10.3)
Chloride: 95 mmol/L — ABNORMAL LOW (ref 101–111)
Creatinine, Ser: 0.88 mg/dL (ref 0.44–1.00)
GFR calc Af Amer: >60 mL/min
GFR calc non Af Amer: >60 mL/min
Glucose, Bld: 67 mg/dL (ref 65–99)
Potassium: 3.6 mmol/L (ref 3.5–5.1)
Sodium: 129 mmol/L — ABNORMAL LOW (ref 135–145)

## 2015-04-28 LAB — CBC
HEMATOCRIT: 36.5 % (ref 36.0–46.0)
Hemoglobin: 12.9 g/dL (ref 12.0–15.0)
MCH: 33.4 pg (ref 26.0–34.0)
MCHC: 35.3 g/dL (ref 30.0–36.0)
MCV: 94.6 fL (ref 78.0–100.0)
PLATELETS: 562 10*3/uL — AB (ref 150–400)
RBC: 3.86 MIL/uL — ABNORMAL LOW (ref 3.87–5.11)
RDW: 12.2 % (ref 11.5–15.5)
WBC: 14.1 10*3/uL — AB (ref 4.0–10.5)

## 2015-04-28 LAB — I-STAT BETA HCG BLOOD, ED (MC, WL, AP ONLY): I-stat hCG, quantitative: <5 m[IU]/mL (ref <5)

## 2015-04-28 LAB — I-STAT CG4 LACTIC ACID, ED
Lactic Acid, Venous: 1.27 mmol/L (ref 0.5–2.0)
Lactic Acid, Venous: 0.92 mmol/L (ref 0.5–2.0)

## 2015-04-28 LAB — SALICYLATE LEVEL: Salicylate Lvl: <4 mg/dL (ref 2.8–30.0)

## 2015-04-28 LAB — I-STAT TROPONIN, ED: Troponin i, poc: 0.01 ng/mL (ref 0.00–0.08)

## 2015-04-28 LAB — LIPASE, BLOOD: Lipase: 15 U/L (ref 11–51)

## 2015-04-28 MED ORDER — GI COCKTAIL ~~LOC~~
30.0000 mL | Freq: Once | ORAL | Status: AC
  Administered 2015-04-28: 30 mL via ORAL
  Filled 2015-04-28: qty 30

## 2015-04-28 MED ORDER — ACETAMINOPHEN 500 MG PO TABS
1000.0000 mg | ORAL_TABLET | Freq: Once | ORAL | Status: AC
  Administered 2015-04-28: 1000 mg via ORAL
  Filled 2015-04-28: qty 2

## 2015-04-28 MED ORDER — SODIUM CHLORIDE 0.9 % IV BOLUS (SEPSIS)
2000.0000 mL | Freq: Once | INTRAVENOUS | Status: AC
  Administered 2015-04-28: 2000 mL via INTRAVENOUS

## 2015-04-28 MED ORDER — PANTOPRAZOLE SODIUM 40 MG IV SOLR
40.0000 mg | Freq: Once | INTRAVENOUS | Status: AC
  Administered 2015-04-28: 40 mg via INTRAVENOUS
  Filled 2015-04-28: qty 40

## 2015-04-28 MED ORDER — IOHEXOL 300 MG/ML  SOLN
100.0000 mL | Freq: Once | INTRAMUSCULAR | Status: AC | PRN
  Administered 2015-04-28: 100 mL via INTRAVENOUS

## 2015-04-28 MED ORDER — MORPHINE SULFATE (PF) 4 MG/ML IV SOLN
4.0000 mg | Freq: Once | INTRAVENOUS | Status: AC
  Administered 2015-04-28: 4 mg via INTRAVENOUS
  Filled 2015-04-28: qty 1

## 2015-04-28 MED ORDER — ONDANSETRON 4 MG PO TBDP: ORAL_TABLET | ORAL | Status: DC

## 2015-04-28 MED ORDER — IOHEXOL 300 MG/ML  SOLN
25.0000 mL | Freq: Once | INTRAMUSCULAR | Status: AC | PRN
  Administered 2015-04-28: 25 mL via ORAL

## 2015-04-28 MED ORDER — ONDANSETRON HCL 4 MG/2ML IJ SOLN
4.0000 mg | Freq: Once | INTRAMUSCULAR | Status: AC
  Administered 2015-04-28: 4 mg via INTRAVENOUS
  Filled 2015-04-28: qty 2

## 2015-04-28 NOTE — ED Provider Notes (Signed)
CSN: [id number]     Arrival date & time 04/27/15  2302 History  By signing my name below, I, Doreatha Martin, attest that this documentation has been prepared under the direction and in the presence of Melene Plan, DO. Electronically Signed: Doreatha Martin, ED Scribe. 04/28/2015. 1:36 AM.    Chief Complaint  Patient presents with  . Chest Pain   Patient is a 30 y.o. female presenting with chest pain. The history is provided by the patient. No language interpreter was used.  Chest Pain Pain location:  L chest and epigastric Pain quality: aching   Pain radiates to:  Does not radiate Pain radiates to the back: no   Pain severity:  Moderate Onset quality:  Gradual Duration:  1 week Timing:  Constant Progression:  Worsening Chronicity:  Recurrent Context: movement   Relieved by:  Nothing Worsened by:  Nothing tried Ineffective treatments:  None tried Associated symptoms: abdominal pain, fatigue, nausea and vomiting   Associated symptoms: no dizziness, no fever, no headache, no palpitations and no shortness of breath   Risk factors comment:  No pain medication or ASA   HPI Comments: Laura Montoya is a 30 y.o. female with h/o schizophrenia who presents to the Emergency Department complaining of moderate epigastric pain for a week with associated left chest pain onset a week, emesis x3 today with green sputum, exertional and generalized fatigue, nausea. Pt denies taking excessive pain medications or ASA.  Pt states she has been able to tolerate fluids, but not food. She states she has not had diarrhea or a BM since being discharged from the hospital for the same pain last week. Pt notes recent sick contact with daughter who had similar symptoms, but not as long lasting. She denies diarrhea. She denies excessive ETOH use.   Past Medical History  Diagnosis Date  . Asthma   . Scoliosis   . Schizophrenia [facility])    Past Surgical History  Procedure Laterality Date  . Breast surgery    . Mouth  surgery     Family History  Problem Relation Age of Onset  . Thyroid disease Mother   . Mental illness Father    Social History  Substance Use Topics  . Smoking status: Current Every Day Smoker    Types: Cigarettes  . Smokeless tobacco: None  . Alcohol Use: No   OB History    No data available     Review of Systems  Constitutional: Positive for fatigue. Negative for fever and chills.  HENT: Negative for congestion and rhinorrhea.   Eyes: Negative for redness and visual disturbance.  Respiratory: Negative for shortness of breath and wheezing.   Cardiovascular: Positive for chest pain. Negative for palpitations.  Gastrointestinal: Positive for nausea, vomiting and abdominal pain.  Genitourinary: Negative for dysuria and urgency.  Musculoskeletal: Negative for myalgias and arthralgias.  Skin: Negative for pallor and wound.  Neurological: Negative for dizziness and headaches.  All other systems reviewed and are negative.  Allergies  Lavender oil  Home Medications   Prior to Admission medications   Medication Sig Start Date End Date Taking? Authorizing Provider  acetaminophen (TYLENOL) 500 MG tablet Take 1,000 mg by mouth every 6 (six) hours as needed (for pain.).   Yes Historical Provider, MD  albuterol (PROVENTIL HFA;VENTOLIN HFA) 108 (90 BASE) MCG/ACT inhaler Inhale 2 puffs into the lungs every 6 (six) hours as needed. For shortness of breath   Yes Historical Provider, MD  ARIPiprazole (ABILIFY) 5 MG tablet Take 5  mg by mouth daily.   Yes Historical Provider, MD  clonazePAM (KLONOPIN) 0.5 MG tablet Take 0.25 mg by mouth 2 (two) times daily as needed for anxiety.   Yes Historical Provider, MD  diphenhydrAMINE (BENADRYL) 25 mg capsule Take 50 mg by mouth every 6 (six) hours as needed. For ithcing   Yes Historical Provider, MD  gabapentin (NEURONTIN) 300 MG capsule Take 300 mg by mouth 2 (two) times daily.   Yes Historical Provider, MD  meclizine (ANTIVERT) 32 MG tablet Take 1  tablet (32 mg total) by mouth 3 (three) times daily. For 3 days scheduled then as needed thereafter Patient taking differently: Take 32 mg by mouth 3 (three) times daily as needed for dizziness.  03/04/15  Yes Ranelle Oyster, MD  naproxen sodium (ANAPROX) 220 MG tablet Take 440 mg by mouth 2 (two) times daily as needed (for pain).   Yes Historical Provider, MD  ondansetron (ZOFRAN) 4 MG tablet Take 1 tablet (4 mg total) by mouth every 6 (six) hours. 04/23/15  Yes Jeffrey Hedges, PA-C  tiZANidine (ZANAFLEX) 4 MG tablet Take 1 tablet (4 mg total) by mouth every 6 (six) hours as needed for muscle spasms. 11/12/14  Yes Ranelle Oyster, MD  traMADol (ULTRAM) 50 MG tablet Take 50 mg by mouth every 6 (six) hours as needed (for pain.).   Yes Historical Provider, MD  venlafaxine XR (EFFEXOR-XR) 75 MG 24 hr capsule Take 75 mg by mouth daily.   Yes Historical Provider, MD  ondansetron (ZOFRAN ODT) 4 MG disintegrating tablet 4mg  ODT q4 hours prn nausea/vomit 03/02/15   Joycie Peek, PA-C  ondansetron (ZOFRAN ODT) 4 MG disintegrating tablet 4mg  ODT q4 hours prn nausea/vomit 04/28/15   Melene Plan, DO   BP 107/55 mmHg  Pulse 101  Temp(Src) 99 F (37.2 C) (Oral)  Resp 21  SpO2 100%  LMP 03/22/2015 Physical Exam  Constitutional: She is oriented to person, place, and time. She appears well-developed and well-nourished.  HENT:  Head: Normocephalic and atraumatic.  Eyes: Conjunctivae and EOM are normal. Pupils are equal, round, and reactive to light.  Neck: Normal range of motion. Neck supple.  Cardiovascular: Regular rhythm.   Tachycardic with regular rhythm.  Pulmonary/Chest: Effort normal. No respiratory distress.  Abdominal: Soft. She exhibits no distension and no mass. There is tenderness. There is no rebound and no guarding.  Negative Murphy's sign. No RUQ tenderness. TTP in the epigastrium.   Musculoskeletal: Normal range of motion.  Neurological: She is alert and oriented to person, place, and  time.  Skin: Skin is warm and dry.  Psychiatric: She has a normal mood and affect. Her behavior is normal.  Nursing note and vitals reviewed.   ED Course  Procedures (including critical care time) DIAGNOSTIC STUDIES: Oxygen Saturation is 100% on RA, normal by my interpretation.    COORDINATION OF CARE: 1:16 AM Discussed treatment plan with pt at bedside and pt agreed to plan.   Labs Review Labs Reviewed  BASIC METABOLIC PANEL - Abnormal; Notable for the following:    Sodium 129 (*)    Chloride 95 (*)    CO2 13 (*)    Anion gap 21 (*)    All other components within normal limits  CBC - Abnormal; Notable for the following:    WBC 14.1 (*)    RBC 3.86 (*)    Platelets 562 (*)    All other components within normal limits  HEPATIC FUNCTION PANEL - Abnormal; Notable for the following:  Total Bilirubin 1.3 (*)    Bilirubin, Direct <0.1 (*)    All other components within normal limits  URINALYSIS, ROUTINE W REFLEX MICROSCOPIC (NOT AT [facility]) - Abnormal; Notable for the following:    Ketones, ur >80 (*)    All other components within normal limits  LIPASE, BLOOD  SALICYLATE LEVEL  I-STAT TROPOININ, ED  I-STAT BETA HCG BLOOD, ED ([facility], [facility], [facility] ONLY)  I-STAT CG4 LACTIC ACID, ED  I-STAT CG4 LACTIC ACID, ED    Imaging Review Dg Chest 2 View  04/28/2015  CLINICAL DATA:  Chest pain.  Vomiting. EXAM: CHEST  2 VIEW COMPARISON:  None. FINDINGS: The cardiomediastinal contours are normal. The lungs are clear. Pulmonary vasculature is normal. No consolidation, pleural effusion, or pneumothorax. No acute osseous abnormalities are seen. IMPRESSION: No acute pulmonary process. Electronically Signed   By: Rubye OaksMelanie  Ehinger M.D.   On: 04/28/2015 01:36   Ct Abdomen Pelvis W Contrast  04/28/2015  CLINICAL DATA:  Epigastric abdominal pain for 1 week. Nausea and vomiting for 3 days. EXAM: CT ABDOMEN AND PELVIS WITH CONTRAST TECHNIQUE: Multidetector CT imaging of the abdomen and pelvis was performed using  the standard protocol following bolus administration of intravenous contrast. CONTRAST:  100mL OMNIPAQUE IOHEXOL 300 MG/ML  SOLN COMPARISON:  None. FINDINGS: Lower chest:  The included lung bases are clear. Liver: Hypodensity adjacent to the falciform ligament, likely focal fatty infiltration. No suspicious lesion. Hepatobiliary: Gallbladder physiologically distended, no calcified stone. No biliary dilatation. Pancreas: Normal.  No ductal dilatation or inflammation. Spleen: Normal. Adrenal glands: No nodule. Kidneys: Symmetric renal enhancement.  No hydronephrosis. Stomach/Bowel: Mild distal esophageal dilatation with enteric contrast. Stomach physiologically distended. There are no dilated or thickened small bowel loops. There is submucosal fatty infiltration throughout the colon without pericolonic inflammatory change. The appendix is normal. Vascular/Lymphatic: No retroperitoneal adenopathy. Abdominal aorta is normal in caliber. Reproductive: Uterus normal in size. Ovaries symmetric in size. No adnexal mass. Bladder: Distended, no wall thickening. Other: No free air, free fluid, or intra-abdominal fluid collection. Musculoskeletal: There are no acute or suspicious osseous abnormalities. IMPRESSION: 1. Mildly patulous distal esophagus containing enteric contrast, question reflux. 2. No additional acute abnormality in the abdomen/pelvis. Submucosal fatty infiltration throughout the colon can be seen with chronic inflammation, no active inflammatory change at this time. Electronically Signed   By: Rubye OaksMelanie  Ehinger M.D.   On: 04/28/2015 04:02   I have personally reviewed and evaluated these images and lab results as part of my medical decision-making.   EKG Interpretation   Date/Time:  Monday April 27 2015 23:53:02 EST Ventricular Rate:  121 PR Interval:  116 QRS Duration: 100 QT Interval:  332 QTC Calculation: 471 R Axis:   89 Text Interpretation:  Sinus tachycardia Incomplete right bundle branch   block Borderline ECG No old tracing to compare Confirmed by [facility]  MD,  ELLIOTT ([id number]) on 04/28/2015 12:44:54 AM      MDM   Final diagnoses:  Epigastric pain  Reflux esophagitis    30 yo F with a cc of vomiting.  This has been going on for the past couple weeks. Patient was seen here about a week ago noted to have an elevated anion gap was given 2 L of fluids with some improvement and then requested discharge home. Patient's symptoms have persisted since then had some mild improvement with Zofran. Having some epigastric pain due to the recurrent vomiting as well. Patient denied fevers or chills. The patient's symptoms initially started when she came  in contact with her daughter who had a GI illness.  With persistent symptoms we'll obtain a CT scan to rule out underlying pathology. CT scan with concern for reflux disease. We'll have the patient start Zantac at home. Patient was reassessed and feels much better. Persistent tachycardia patient was given 4 L of IV fluids. Heart rate down in the low 100s. Repeat abdominal exam benign. Will have the patient push fluids at home. Return for sudden worsening, or fever. I personally performed the services described in this documentation, which was scribed in my presence. The recorded information has been reviewed and is accurate.   5:31 AM:  I have discussed the diagnosis/risks/treatment options with the patient and family and believe the pt to be eligible for discharge home to follow-up with PCP. We also discussed returning to the ED immediately if new or worsening sx occur. We discussed the sx which are most concerning (e.g., sudden worsening pain, fever, inability to tolerate by mouth) that necessitate immediate return. Medications administered to the patient during their visit and any new prescriptions provided to the patient are listed below.  Medications given during this visit Medications  acetaminophen (TYLENOL) tablet 1,000 mg (1,000 mg Oral  Given 04/28/15 0132)  sodium chloride 0.9 % bolus 2,000 mL (0 mLs Intravenous Stopped 04/28/15 0401)  morphine 4 MG/ML injection 4 mg (4 mg Intravenous Given 04/28/15 0132)  ondansetron (ZOFRAN) injection 4 mg (4 mg Intravenous Given 04/28/15 0133)  gi cocktail (Maalox,Lidocaine,Donnatal) (30 mLs Oral Given 04/28/15 0132)  pantoprazole (PROTONIX) injection 40 mg (40 mg Intravenous Given 04/28/15 0133)  iohexol (OMNIPAQUE) 300 MG/ML solution 25 mL (25 mLs Oral Contrast Given 04/28/15 0228)  iohexol (OMNIPAQUE) 300 MG/ML solution 100 mL (100 mLs Intravenous Contrast Given 04/28/15 0343)  sodium chloride 0.9 % bolus 2,000 mL (2,000 mLs Intravenous New Bag/Given 04/28/15 0425)    New Prescriptions   ONDANSETRON (ZOFRAN ODT) 4 MG DISINTEGRATING TABLET    4mg  ODT q4 hours prn nausea/vomit    The patient appears reasonably screen and/or stabilized for discharge and I doubt any other medical condition or other EMC requiring further screening, evaluation, or treatment in the ED at this time prior to discharge.     Melene Plan, DO 04/28/15 [phone]

## 2015-04-28 NOTE — ED Notes (Signed)
Disregard pulse rate from 0200. Actual pulse rate was 106 bpm

## 2015-04-28 NOTE — Discharge Instructions (Signed)
Takes Zantac 150 mg twice a day. Follow with your doctor. Abdominal Pain, Adult Many things can cause abdominal pain. Usually, abdominal pain is not caused by a disease and will improve without treatment. It can often be observed and treated at home. Your health care provider will do a physical exam and possibly order blood tests and X-rays to help determine the seriousness of your pain. However, in many cases, more time must pass before a clear cause of the pain can be found. Before that point, your health care provider may not know if you need more testing or further treatment. HOME CARE INSTRUCTIONS Monitor your abdominal pain for any changes. The following actions may help to alleviate any discomfort you are experiencing:  Only take over-the-counter or prescription medicines as directed by your health care provider.  Do not take laxatives unless directed to do so by your health care provider.  Try a clear liquid diet (broth, tea, or water) as directed by your health care provider. Slowly move to a bland diet as tolerated. SEEK MEDICAL CARE IF:  You have unexplained abdominal pain.  You have abdominal pain associated with nausea or diarrhea.  You have pain when you urinate or have a bowel movement.  You experience abdominal pain that wakes you in the night.  You have abdominal pain that is worsened or improved by eating food.  You have abdominal pain that is worsened with eating fatty foods.  You have a fever. SEEK IMMEDIATE MEDICAL CARE IF:  Your pain does not go away within 2 hours.  You keep throwing up (vomiting).  Your pain is felt only in portions of the abdomen, such as the right side or the left lower portion of the abdomen.  You pass bloody or black tarry stools. MAKE SURE YOU:  Understand these instructions.  Will watch your condition.  Will get help right away if you are not doing well or get worse.   This information is not intended to replace advice given to  you by your health care provider. Make sure you discuss any questions you have with your health care provider.   Document Released: 01/19/2005 Document Revised: 12/31/2014 Document Reviewed: 12/19/2012 Elsevier Interactive Patient Education [employer]

## 2015-04-28 NOTE — ED Notes (Signed)
Patient transported to CT 

## 2015-04-28 NOTE — ED Notes (Signed)
MD at bedside. 

## 2015-04-28 NOTE — ED Notes (Signed)
Nurse starting IV 

## 2015-04-28 NOTE — ED Notes (Signed)
Pt was able to use female urinal for urine sample

## 2015-04-29 ENCOUNTER — Inpatient Hospital Stay (HOSPITAL_COMMUNITY)
Admission: EM | Admit: 2015-04-29 | Discharge: 2015-05-01 | DRG: 392 | Disposition: A | Discharge status: Home / Self Care | Payer: Medicaid Other | Attending: Internal Medicine | Admitting: Internal Medicine

## 2015-04-29 ENCOUNTER — Emergency Department (HOSPITAL_COMMUNITY): Payer: Medicaid Other

## 2015-04-29 ENCOUNTER — Encounter: Payer: Self-pay | Admitting: Gastroenterology

## 2015-04-29 ENCOUNTER — Encounter (HOSPITAL_COMMUNITY): Payer: Self-pay

## 2015-04-29 DIAGNOSIS — E871 Hypo-osmolality and hyponatremia: Secondary | ICD-10-CM | POA: Diagnosis present

## 2015-04-29 DIAGNOSIS — K210 Gastro-esophageal reflux disease with esophagitis: Secondary | ICD-10-CM | POA: Diagnosis present

## 2015-04-29 DIAGNOSIS — E874 Mixed disorder of acid-base balance: Secondary | ICD-10-CM | POA: Diagnosis present

## 2015-04-29 DIAGNOSIS — F17210 Nicotine dependence, cigarettes, uncomplicated: Secondary | ICD-10-CM | POA: Diagnosis present

## 2015-04-29 DIAGNOSIS — E876 Hypokalemia: Secondary | ICD-10-CM | POA: Diagnosis present

## 2015-04-29 DIAGNOSIS — F209 Schizophrenia, unspecified: Secondary | ICD-10-CM | POA: Diagnosis present

## 2015-04-29 DIAGNOSIS — Z79899 Other long term (current) drug therapy: Secondary | ICD-10-CM

## 2015-04-29 DIAGNOSIS — R9431 Abnormal electrocardiogram [ECG] [EKG]: Secondary | ICD-10-CM

## 2015-04-29 DIAGNOSIS — M419 Scoliosis, unspecified: Secondary | ICD-10-CM | POA: Diagnosis present

## 2015-04-29 DIAGNOSIS — R1110 Vomiting, unspecified: Secondary | ICD-10-CM

## 2015-04-29 DIAGNOSIS — E873 Alkalosis: Secondary | ICD-10-CM

## 2015-04-29 DIAGNOSIS — I4581 Long QT syndrome: Secondary | ICD-10-CM | POA: Diagnosis present

## 2015-04-29 DIAGNOSIS — M792 Neuralgia and neuritis, unspecified: Secondary | ICD-10-CM | POA: Diagnosis present

## 2015-04-29 DIAGNOSIS — A084 Viral intestinal infection, unspecified: Principal | ICD-10-CM | POA: Diagnosis present

## 2015-04-29 DIAGNOSIS — J45909 Unspecified asthma, uncomplicated: Secondary | ICD-10-CM | POA: Diagnosis present

## 2015-04-29 DIAGNOSIS — R079 Chest pain, unspecified: Secondary | ICD-10-CM

## 2015-04-29 LAB — CBC
HCT: 34.4 % — ABNORMAL LOW (ref 36.0–46.0)
Hemoglobin: 12.4 g/dL (ref 12.0–15.0)
MCH: 32.9 pg (ref 26.0–34.0)
MCHC: 36 g/dL (ref 30.0–36.0)
MCV: 91.2 fL (ref 78.0–100.0)
PLATELETS: 570 10*3/uL — AB (ref 150–400)
RBC: 3.77 MIL/uL — AB (ref 3.87–5.11)
RDW: 12.1 % (ref 11.5–15.5)
WBC: 11.3 10*3/uL — AB (ref 4.0–10.5)

## 2015-04-29 LAB — I-STAT TROPONIN, ED: TROPONIN I, POC: 0 ng/mL (ref 0.00–0.08)

## 2015-04-29 LAB — BASIC METABOLIC PANEL
Anion gap: 16 — ABNORMAL HIGH (ref 5–15)
CALCIUM: 9.1 mg/dL (ref 8.9–10.3)
CHLORIDE: 100 mmol/L — AB (ref 101–111)
CO2: 16 mmol/L — AB (ref 22–32)
CREATININE: 0.87 mg/dL (ref 0.44–1.00)
GFR calc Af Amer: >60 mL/min (ref >60)
GFR calc non Af Amer: >60 mL/min (ref >60)
Glucose, Bld: 100 mg/dL — ABNORMAL HIGH (ref 65–99)
Potassium: 2.4 mmol/L — CL (ref 3.5–5.1)
Sodium: 132 mmol/L — ABNORMAL LOW (ref 135–145)

## 2015-04-29 MED ORDER — POTASSIUM CHLORIDE 10 MEQ/100ML IV SOLN
10.0000 meq | INTRAVENOUS | Status: AC
  Administered 2015-04-30 (×4): 10 meq via INTRAVENOUS
  Filled 2015-04-29 (×4): qty 100

## 2015-04-29 MED ORDER — SODIUM BICARBONATE 8.4 % IV SOLN: 50.0000 meq | Freq: Once | INTRAVENOUS | Status: DC

## 2015-04-29 MED ORDER — LACTATED RINGERS IV BOLUS (SEPSIS)
1000.0000 mL | Freq: Once | INTRAVENOUS | Status: AC
  Administered 2015-04-30: 1000 mL via INTRAVENOUS

## 2015-04-29 NOTE — ED Provider Notes (Signed)
CSN: [id number]     Arrival date & time 04/29/15  2235 History  By signing my name below, I, Laura Montoya, attest that this documentation has been prepared under the direction and in the presence of Derwood KaplanAnkit Shenandoah Vandergriff, MD . Electronically Signed: Freida Busmaniana Montoya, Scribe. 04/30/2015. 12:38 AM.    Chief Complaint  Patient presents with  . Chest Pain     The history is provided by the patient. No language interpreter was used.     HPI Comments:  Avon GullyMelissa Ann Montoya is a 30 y.o. female who presents to the Emergency Department complaining of 9/10 left sided CP that began ~ 6 days ago. She notes her pain has been constant and she describes her pain as someone stepping on her chest. Her pain is exacerbated when eating. She reports nausea vomiting, diarrhea, and abdominal pain that began 9 days ago. Her vomiting and diarrhea resolved yesterday, however, nausea and abdominal pain have continued. She was seen in the ED on 04/27/15 for the same; states she was diagnosed with GERD. She has taken zofran with mild relief. Pt is a current smoker; ~ 0.5 pack per day. She denies uses of illicit drugs and ETOH. She also notes numbness and tingling to her face.   Past Medical History  Diagnosis Date  . Asthma   . Scoliosis   . Schizophrenia [facility](HCC)    Past Surgical History  Procedure Laterality Date  . Breast surgery    . Mouth surgery     Family History  Problem Relation Age of Onset  . Thyroid disease Mother   . Mental illness Father    Social History  Substance Use Topics  . Smoking status: Current Every Day Smoker    Types: Cigarettes  . Smokeless tobacco: None  . Alcohol Use: No   OB History    No data available     Review of Systems  10 systems reviewed and all are negative for acute change except as noted in the HPI.   Allergies  Lavender oil  Home Medications   Prior to Admission medications   Medication Sig Start Date End Date Taking? Authorizing Provider  acetaminophen (TYLENOL)  500 MG tablet Take 1,000 mg by mouth every 6 (six) hours as needed (for pain.).   Yes Historical Provider, MD  albuterol (PROVENTIL HFA;VENTOLIN HFA) 108 (90 BASE) MCG/ACT inhaler Inhale 2 puffs into the lungs every 6 (six) hours as needed. For shortness of breath   Yes Historical Provider, MD  diphenhydrAMINE (BENADRYL) 25 mg capsule Take 50 mg by mouth every 6 (six) hours as needed. For ithcing   Yes Historical Provider, MD  gabapentin (NEURONTIN) 300 MG capsule Take 300 mg by mouth 2 (two) times daily.   Yes Historical Provider, MD  meclizine (ANTIVERT) 32 MG tablet Take 1 tablet (32 mg total) by mouth 3 (three) times daily. For 3 days scheduled then as needed thereafter Patient taking differently: Take 32 mg by mouth 3 (three) times daily as needed for dizziness.  03/04/15  Yes Ranelle OysterZachary T Swartz, MD  naproxen sodium (ANAPROX) 220 MG tablet Take 440 mg by mouth 2 (two) times daily as needed (for pain).   Yes Historical Provider, MD  ondansetron (ZOFRAN) 4 MG tablet Take 4 mg by mouth every 8 (eight) hours as needed for nausea or vomiting.   Yes Historical Provider, MD  tiZANidine (ZANAFLEX) 4 MG tablet Take 1 tablet (4 mg total) by mouth every 6 (six) hours as needed for muscle spasms. 11/12/14  Yes Ranelle Oyster, MD  traMADol (ULTRAM) 50 MG tablet Take 50 mg by mouth every 6 (six) hours as needed (for pain.).   Yes Historical Provider, MD  ARIPiprazole (ABILIFY) 5 MG tablet Take 5 mg by mouth daily. Reported on 04/29/2015    Historical Provider, MD  ondansetron (ZOFRAN ODT) 4 MG disintegrating tablet 4mg  ODT q4 hours prn nausea/vomit Patient not taking: Reported on 04/29/2015 03/02/15   Joycie Peek, PA-C  ondansetron (ZOFRAN ODT) 4 MG disintegrating tablet 4mg  ODT q4 hours prn nausea/vomit Patient not taking: Reported on 04/29/2015 04/28/15   Melene Plan, DO  ondansetron (ZOFRAN) 4 MG tablet Take 1 tablet (4 mg total) by mouth every 6 (six) hours. Patient not taking: Reported on 04/29/2015 04/23/15    Eyvonne Mechanic, PA-C   BP 115/59 mmHg  Pulse 117  Temp(Src) 98 F (36.7 C) (Oral)  Resp 24  SpO2 100%  LMP 03/22/2015 Physical Exam  Constitutional: She is oriented to person, place, and time. She appears well-developed and well-nourished. No distress.  HENT:  Head: Normocephalic and atraumatic.  Mouth/Throat: Mucous membranes are dry.  Mild oral thrush noted  Eyes: Conjunctivae are normal. No scleral icterus.  Cardiovascular: Tachycardia present.   No murmur heard. Pulmonary/Chest: Effort normal and breath sounds normal. No respiratory distress. She has no wheezes. She has no rales.  Lungs clear to ascultation    Abdominal: Soft. She exhibits no distension. There is tenderness. There is guarding (Voluntary).  TTP to Bilateral upper quadrant; worse in epigastrum  Neurological: She is alert and oriented to person, place, and time.  Skin: Skin is warm and dry.  Psychiatric: She has a normal mood and affect.  Nursing note and vitals reviewed.   ED Course  Procedures   DIAGNOSTIC STUDIES:  Oxygen Saturation is 100% on RA, normal by my interpretation.    COORDINATION OF CARE:  12:05 AM Discussed treatment plan with pt at bedside and pt agreed to plan.  Labs Review Labs Reviewed  BASIC METABOLIC PANEL - Abnormal; Notable for the following:    Sodium 132 (*)    Potassium 2.4 (*)    Chloride 100 (*)    CO2 16 (*)    Glucose, Bld 100 (*)    BUN <5 (*)    Anion gap 16 (*)    All other components within normal limits  CBC - Abnormal; Notable for the following:    WBC 11.3 (*)    RBC 3.77 (*)    HCT 34.4 (*)    Platelets 570 (*)    All other components within normal limits  MAGNESIUM - Abnormal; Notable for the following:    Magnesium 1.6 (*)    All other components within normal limits  URINALYSIS, ROUTINE W REFLEX MICROSCOPIC (NOT AT [facility]) - Abnormal; Notable for the following:    APPearance CLOUDY (*)    Ketones, ur 40 (*)    All other components within normal  limits  HEPATIC FUNCTION PANEL - Abnormal; Notable for the following:    Total Protein 5.5 (*)    Albumin 2.8 (*)    All other components within normal limits  I-STAT VENOUS BLOOD GAS, ED - Abnormal; Notable for the following:    pH, Ven 7.514 (*)    pCO2, Ven 23.2 (*)    Bicarbonate 18.7 (*)    Acid-base deficit 3.0 (*)    All other components within normal limits  RAPID HIV SCREEN (HIV 1/2 AB+AG)  I-STAT TROPOININ, ED  Imaging Review Dg Chest 2 View  04/29/2015  CLINICAL DATA:  Chest pain, body aches and vomiting EXAM: CHEST  2 VIEW COMPARISON:  Radiograph 04/28/2015 FINDINGS: Normal mediastinum and cardiac silhouette. Normal pulmonary vasculature. No evidence of effusion, infiltrate, or pneumothorax. No acute bony abnormality. IMPRESSION: No acute cardiopulmonary process. Electronically Signed   By: Genevive Bi M.D.   On: 04/29/2015 23:39   Ct Abdomen Pelvis W Contrast  04/28/2015  CLINICAL DATA:  Epigastric abdominal pain for 1 week. Nausea and vomiting for 3 days. EXAM: CT ABDOMEN AND PELVIS WITH CONTRAST TECHNIQUE: Multidetector CT imaging of the abdomen and pelvis was performed using the standard protocol following bolus administration of intravenous contrast. CONTRAST:  OMNIPAQUE IOHEXOL 300 MG/ML  SOLN COMPARISON:  None. FINDINGS: Lower chest:  The included lung bases are clear. Liver: Hypodensity adjacent to the falciform ligament, likely focal fatty infiltration. No suspicious lesion. Hepatobiliary: Gallbladder physiologically distended, no calcified stone. No biliary dilatation. Pancreas: Normal.  No ductal dilatation or inflammation. Spleen: Normal. Adrenal glands: No nodule. Kidneys: Symmetric renal enhancement.  No hydronephrosis. Stomach/Bowel: Mild distal esophageal dilatation with enteric contrast. Stomach physiologically distended. There are no dilated or thickened small bowel loops. There is submucosal fatty infiltration throughout the colon without pericolonic  inflammatory change. The appendix is normal. Vascular/Lymphatic: No retroperitoneal adenopathy. Abdominal aorta is normal in caliber. Reproductive: Uterus normal in size. Ovaries symmetric in size. No adnexal mass. Bladder: Distended, no wall thickening. Other: No free air, free fluid, or intra-abdominal fluid collection. Musculoskeletal: There are no acute or suspicious osseous abnormalities. IMPRESSION: 1. Mildly patulous distal esophagus containing enteric contrast, question reflux. 2. No additional acute abnormality in the abdomen/pelvis. Submucosal fatty infiltration throughout the colon can be seen with chronic inflammation, no active inflammatory change at this time. Electronically Signed   By: Rubye Oaks M.D.   On: 04/28/2015 04:02   I have personally reviewed and evaluated these images and lab results as part of my medical decision-making.   EKG Interpretation   Date/Time:  Wednesday April 29 2015 22:43:29 EST Ventricular Rate:  127 PR Interval:  124 QRS Duration: 104 QT Interval:  412 QTC Calculation: 598 R Axis:   154 Text Interpretation:  Sinus tachycardia Left posterior fascicular block  Cannot rule out Inferior infarct , age undetermined Cannot rule out  Anterior infarct , age undetermined Abnormal ECG Nonspecific ST and T wave  abnormality possube /u waves seen in leads II, III. V4-v6 new changes seen  Confirmed by Rhunette Croft, MD, Janey Genta [phone]) on 04/29/2015 11:54:53 PM      MDM   Final diagnoses:  Hypochloremic alkalosis  Intractable vomiting with nausea, vomiting of unspecified type  Prolonged Q-T interval on ECG  Acute hypokalemia  Hypomagnesemia   I personally performed the services described in this documentation, which was scribed in my presence. The recorded information has been reviewed and is accurate.  Pt comes in with cc of nausea and emesis with chest pain. Pt has had these symptoms for a while now and this is her 3rd ER visit for the same  complain.  Previous workup has included CT scan of the abd that was neg.  Pt is immunocompetent. She is noted to be dry. She reports intractable nausea with emesis. She is in hypochloremic hypokalemic alkalosis - likely from her emesis. She is also in prolonged QTc and has U waves.  Will start giving her K. Mag checked and low as well, will replace that as well. She is on abilify,  and zofran - both i am sure can prolong QTc. Not safe for [name] to send her home with the EKG tracing.  Pt has epigastric tenderness. Likely Gastritis. Will give protonix here, Ativan for nausea given.  Also has thrush. Will screen for HIV.  CRITICAL CARE Performed by: Derwood Kaplan   Total critical care time: 35 minutes - prolonged QTc, from Low K and low Mag  Critical care time was exclusive of separately billable procedures and treating other patients.  Critical care was necessary to treat or prevent imminent or life-threatening deterioration.  Critical care was time spent personally by me on the following activities: development of treatment plan with patient and/or surrogate as well as nursing, discussions with consultants, evaluation of patient's response to treatment, examination of patient, obtaining history from patient or surrogate, ordering and performing treatments and interventions, ordering and review of laboratory studies, ordering and review of radiographic studies, pulse oximetry and re-evaluation of patient's condition.    Derwood Kaplan, MD 04/30/15 [phone]

## 2015-04-29 NOTE — ED Notes (Addendum)
Pt has had chest pain since last Friday and [facility] and gotten IVF for N/V. Tonight she felt like her heart was racing and has had trouble getting it to slow down. Pt was given Zofran to take at home for N/V and the last one she took was at 2130. EKG shows long QT.

## 2015-04-29 NOTE — ED Notes (Signed)
K+ 2.4 - critical lab noted. Pt waiting for room

## 2015-04-30 DIAGNOSIS — M419 Scoliosis, unspecified: Secondary | ICD-10-CM | POA: Diagnosis present

## 2015-04-30 DIAGNOSIS — R079 Chest pain, unspecified: Secondary | ICD-10-CM

## 2015-04-30 DIAGNOSIS — Z79899 Other long term (current) drug therapy: Secondary | ICD-10-CM | POA: Diagnosis not present

## 2015-04-30 DIAGNOSIS — E871 Hypo-osmolality and hyponatremia: Secondary | ICD-10-CM | POA: Diagnosis present

## 2015-04-30 DIAGNOSIS — E876 Hypokalemia: Secondary | ICD-10-CM | POA: Diagnosis present

## 2015-04-30 DIAGNOSIS — A084 Viral intestinal infection, unspecified: Secondary | ICD-10-CM | POA: Diagnosis present

## 2015-04-30 DIAGNOSIS — M792 Neuralgia and neuritis, unspecified: Secondary | ICD-10-CM | POA: Diagnosis present

## 2015-04-30 DIAGNOSIS — I4581 Long QT syndrome: Secondary | ICD-10-CM | POA: Diagnosis present

## 2015-04-30 DIAGNOSIS — R112 Nausea with vomiting, unspecified: Secondary | ICD-10-CM | POA: Diagnosis not present

## 2015-04-30 DIAGNOSIS — K210 Gastro-esophageal reflux disease with esophagitis: Secondary | ICD-10-CM | POA: Diagnosis present

## 2015-04-30 DIAGNOSIS — F209 Schizophrenia, unspecified: Secondary | ICD-10-CM | POA: Diagnosis present

## 2015-04-30 DIAGNOSIS — F17210 Nicotine dependence, cigarettes, uncomplicated: Secondary | ICD-10-CM | POA: Diagnosis present

## 2015-04-30 DIAGNOSIS — J45909 Unspecified asthma, uncomplicated: Secondary | ICD-10-CM | POA: Diagnosis present

## 2015-04-30 DIAGNOSIS — E874 Mixed disorder of acid-base balance: Secondary | ICD-10-CM | POA: Diagnosis present

## 2015-04-30 DIAGNOSIS — E873 Alkalosis: Secondary | ICD-10-CM | POA: Diagnosis not present

## 2015-04-30 LAB — BASIC METABOLIC PANEL
Anion gap: 9 (ref 5–15)
CHLORIDE: 103 mmol/L (ref 101–111)
CO2: 20 mmol/L — AB (ref 22–32)
Calcium: 8 mg/dL — ABNORMAL LOW (ref 8.9–10.3)
Creatinine, Ser: 0.6 mg/dL (ref 0.44–1.00)
GFR calc Af Amer: >60 mL/min (ref >60)
GFR calc non Af Amer: >60 mL/min (ref >60)
GLUCOSE: 86 mg/dL (ref 65–99)
POTASSIUM: 4.7 mmol/L (ref 3.5–5.1)
Sodium: 132 mmol/L — ABNORMAL LOW (ref 135–145)

## 2015-04-30 LAB — RAPID HIV SCREEN (HIV 1/2 AB+AG)
HIV 1/2 ANTIBODIES: NONREACTIVE
HIV-1 P24 Antigen - HIV24: NONREACTIVE

## 2015-04-30 LAB — I-STAT VENOUS BLOOD GAS, ED
Acid-base deficit: 3 mmol/L — ABNORMAL HIGH (ref 0.0–2.0)
BICARBONATE: 18.7 meq/L — AB (ref 20.0–24.0)
O2 SAT: 86 %
PCO2 VEN: 23.2 mmHg — AB (ref 45.0–50.0)
PH VEN: 7.514 — AB (ref 7.250–7.300)
TCO2: 19 mmol/L (ref 0–100)
pO2, Ven: 45 mmHg (ref 30.0–45.0)

## 2015-04-30 LAB — URINALYSIS, ROUTINE W REFLEX MICROSCOPIC
Bilirubin Urine: NEGATIVE
Glucose, UA: NEGATIVE mg/dL
Hgb urine dipstick: NEGATIVE
Ketones, ur: 40 mg/dL — AB
LEUKOCYTES UA: NEGATIVE
NITRITE: NEGATIVE
PH: 6.5 (ref 5.0–8.0)
Protein, ur: NEGATIVE mg/dL
SPECIFIC GRAVITY, URINE: 1.005 (ref 1.005–1.030)

## 2015-04-30 LAB — HEPATIC FUNCTION PANEL
ALBUMIN: 2.8 g/dL — AB (ref 3.5–5.0)
ALK PHOS: 54 U/L (ref 38–126)
ALT: 15 U/L (ref 14–54)
AST: 18 U/L (ref 15–41)
BILIRUBIN TOTAL: 0.5 mg/dL (ref 0.3–1.2)
Bilirubin, Direct: 0.1 mg/dL (ref 0.1–0.5)
Indirect Bilirubin: 0.4 mg/dL (ref 0.3–0.9)
Total Protein: 5.5 g/dL — ABNORMAL LOW (ref 6.5–8.1)

## 2015-04-30 LAB — MAGNESIUM: Magnesium: 1.6 mg/dL — ABNORMAL LOW (ref 1.7–2.4)

## 2015-04-30 LAB — CREATININE, SERUM
Creatinine, Ser: 0.71 mg/dL (ref 0.44–1.00)
GFR calc Af Amer: >60 mL/min (ref >60)
GFR calc non Af Amer: >60 mL/min (ref >60)

## 2015-04-30 LAB — TROPONIN I: TROPONIN I: 0.03 ng/mL (ref <0.031)

## 2015-04-30 MED ORDER — POTASSIUM CHLORIDE CRYS ER 20 MEQ PO TBCR
40.0000 meq | EXTENDED_RELEASE_TABLET | Freq: Once | ORAL | Status: AC
  Administered 2015-04-30: 40 meq via ORAL
  Filled 2015-04-30: qty 2

## 2015-04-30 MED ORDER — SODIUM CHLORIDE 0.9 % IJ SOLN: 3.0000 mL | Freq: Two times a day (BID) | INTRAMUSCULAR | Status: DC

## 2015-04-30 MED ORDER — ACETAMINOPHEN 325 MG PO TABS: 650.0000 mg | ORAL_TABLET | Freq: Four times a day (QID) | ORAL | Status: DC | PRN

## 2015-04-30 MED ORDER — ACETAMINOPHEN 650 MG RE SUPP: 650.0000 mg | Freq: Four times a day (QID) | RECTAL | Status: DC | PRN

## 2015-04-30 MED ORDER — TRAMADOL HCL 50 MG PO TABS
50.0000 mg | ORAL_TABLET | Freq: Four times a day (QID) | ORAL | Status: DC | PRN
  Administered 2015-04-30 – 2015-05-01 (×4): 50 mg via ORAL
  Filled 2015-04-30 (×4): qty 1

## 2015-04-30 MED ORDER — PROMETHAZINE HCL 25 MG PO TABS: 12.5000 mg | ORAL_TABLET | Freq: Four times a day (QID) | ORAL | Status: DC | PRN

## 2015-04-30 MED ORDER — MAGNESIUM SULFATE 2 GM/50ML IV SOLN
2.0000 g | Freq: Once | INTRAVENOUS | Status: AC
  Administered 2015-04-30: 2 g via INTRAVENOUS
  Filled 2015-04-30: qty 50

## 2015-04-30 MED ORDER — POTASSIUM CHLORIDE 10 MEQ/100ML IV SOLN
10.0000 meq | INTRAVENOUS | Status: AC
  Administered 2015-04-30 (×3): 10 meq via INTRAVENOUS
  Filled 2015-04-30 (×4): qty 100

## 2015-04-30 MED ORDER — ARIPIPRAZOLE 5 MG PO TABS
5.0000 mg | ORAL_TABLET | Freq: Every day | ORAL | Status: DC
  Administered 2015-04-30 – 2015-05-01 (×2): 5 mg via ORAL
  Filled 2015-04-30 (×2): qty 1

## 2015-04-30 MED ORDER — PANTOPRAZOLE SODIUM 40 MG IV SOLR
40.0000 mg | Freq: Once | INTRAVENOUS | Status: DC
  Filled 2015-04-30: qty 40

## 2015-04-30 MED ORDER — ENOXAPARIN SODIUM 40 MG/0.4ML ~~LOC~~ SOLN
40.0000 mg | SUBCUTANEOUS | Status: DC
  Administered 2015-04-30 – 2015-05-01 (×2): 40 mg via SUBCUTANEOUS
  Filled 2015-04-30 (×3): qty 0.4

## 2015-04-30 MED ORDER — DICLOFENAC SODIUM 1 % TD GEL
4.0000 g | Freq: Four times a day (QID) | TRANSDERMAL | Status: DC | PRN
  Administered 2015-05-01 (×2): 4 g via TOPICAL
  Filled 2015-04-30: qty 100

## 2015-04-30 MED ORDER — GI COCKTAIL ~~LOC~~: 30.0000 mL | Freq: Four times a day (QID) | ORAL | Status: DC | PRN

## 2015-04-30 MED ORDER — GI COCKTAIL ~~LOC~~
30.0000 mL | Freq: Once | ORAL | Status: AC
  Administered 2015-04-30: 30 mL via ORAL
  Filled 2015-04-30: qty 30

## 2015-04-30 MED ORDER — MAGNESIUM SULFATE 2 GM/50ML IV SOLN: 2.0000 g | Freq: Once | INTRAVENOUS | Status: DC

## 2015-04-30 MED ORDER — PANTOPRAZOLE SODIUM 40 MG IV SOLR
40.0000 mg | Freq: Once | INTRAVENOUS | Status: AC
  Administered 2015-04-30: 40 mg via INTRAVENOUS

## 2015-04-30 MED ORDER — GABAPENTIN 300 MG PO CAPS
300.0000 mg | ORAL_CAPSULE | Freq: Two times a day (BID) | ORAL | Status: DC
Start: 2015-04-30 — End: 2015-05-01
  Administered 2015-04-30 – 2015-05-01 (×3): 300 mg via ORAL
  Filled 2015-04-30 (×4): qty 1

## 2015-04-30 MED ORDER — LACTATED RINGERS IV SOLN
INTRAVENOUS | Status: AC
  Administered 2015-04-30: 04:00:00 via INTRAVENOUS

## 2015-04-30 MED ORDER — METHOCARBAMOL 1000 MG/10ML IJ SOLN: 500.0000 mg | Freq: Three times a day (TID) | INTRAVENOUS | Status: DC | PRN

## 2015-04-30 MED ORDER — DIPHENHYDRAMINE HCL 25 MG PO CAPS
25.0000 mg | ORAL_CAPSULE | Freq: Once | ORAL | Status: AC
  Administered 2015-04-30: 25 mg via ORAL
  Filled 2015-04-30: qty 1

## 2015-04-30 MED ORDER — FLUCONAZOLE 100 MG PO TABS
150.0000 mg | ORAL_TABLET | Freq: Once | ORAL | Status: AC
  Administered 2015-04-30: 150 mg via ORAL
  Filled 2015-04-30: qty 2

## 2015-04-30 MED ORDER — LORAZEPAM 2 MG/ML IJ SOLN
1.0000 mg | Freq: Once | INTRAMUSCULAR | Status: AC
  Administered 2015-04-30: 1 mg via INTRAVENOUS
  Filled 2015-04-30: qty 1

## 2015-04-30 MED ORDER — ACETAMINOPHEN 325 MG PO TABS: 650.0000 mg | ORAL_TABLET | ORAL | Status: DC | PRN

## 2015-04-30 MED ORDER — RISAQUAD PO CAPS
2.0000 | ORAL_CAPSULE | Freq: Every day | ORAL | Status: DC
  Administered 2015-04-30 – 2015-05-01 (×2): 2 via ORAL
  Filled 2015-04-30 (×2): qty 2

## 2015-04-30 MED ORDER — ONDANSETRON HCL 4 MG/2ML IJ SOLN: 4.0000 mg | Freq: Four times a day (QID) | INTRAMUSCULAR | Status: DC | PRN

## 2015-04-30 MED ORDER — SODIUM CHLORIDE 0.9 % IV SOLN
INTRAVENOUS | Status: DC
  Administered 2015-04-30 (×2): via INTRAVENOUS

## 2015-04-30 MED ORDER — FENTANYL CITRATE (PF) 100 MCG/2ML IJ SOLN
50.0000 ug | Freq: Once | INTRAMUSCULAR | Status: AC
  Administered 2015-04-30: 50 ug via INTRAVENOUS
  Filled 2015-04-30: qty 2

## 2015-04-30 NOTE — Progress Notes (Signed)
Utilization review completed. Mcgwire Dasaro, RN, BSN. 

## 2015-04-30 NOTE — ED Notes (Signed)
Patient states the chest pain is worse when she "drinks water and the feeling is a squeeze and let go".

## 2015-04-30 NOTE — H&P (Addendum)
[facility] History and Physical  Denice Cardon IHK:[id number] DOB: [birth year] DOA: 04/29/2015  Referring physician: Dr. Janey Genta - [facility] PCP: Arlyss Queen   Chief Complaint: CP, n/v/abd pain  HPI: Laura Montoya is a 30 y.o. female  9 days ago patient developed nausea vomiting and generalized abdominal pain. Vomiting and diarrhea resolved 1 day ago. Patient continues to have nausea and mild abdominal pain. Patient subsequently developed chest pain 6 days ago. Substernal. Described as someone stepping on her chest without radiation. Smokes a half pack per day. Patient seen in the ED 1 day ago and was diagnosed with viral gastroenteritis. ED evaluation on day of admission she is noted to have a prolonged QT and multiple Abnormalities.   Review of Systems:  Constitutional:  No weight loss, night sweats, Fevers,  HEENT:  No headaches, Difficulty swallowing,Tooth/dental problems,Sore throat, Cardio-vascular: Per HPi, denies palpitations, LE swelling GI: Per HPi Resp:   No shortness of breath with exertion or at rest. No excess mucus, no productive cough, No non-productive cough, No coughing up of blood.No change in color of mucus.No wheezing.No chest wall deformity  Skin:  no rash or lesions.  GU:  no dysuria, change in color of urine, no urgency or frequency. No flank pain.  Musculoskeletal:   Baseline lower back pain from scoliosis Psych:  No change in mood or affect. No depression or anxiety. No memory loss.  Neuro:  No change in sensation, unilateral strength, or cognitive abilities  All other systems were reviewed and are negative.  Past Medical History  Diagnosis Date  . Asthma   . Scoliosis   . Schizophrenia [facility])    Past Surgical History  Procedure Laterality Date  . Breast surgery    . Mouth surgery     Social History:  reports that she has been smoking Cigarettes.  She does not have any smokeless tobacco history on file. She reports that she does  not drink alcohol or use illicit drugs.  Allergies  Allergen Reactions  . Lavender Oil Rash    Family History  Problem Relation Age of Onset  . Thyroid disease Mother   . Mental illness Father      Prior to Admission medications   Medication Sig Start Date End Date Taking? Authorizing Provider  acetaminophen (TYLENOL) 500 MG tablet Take 1,000 mg by mouth every 6 (six) hours as needed (for pain.).   Yes Historical Provider, MD  albuterol (PROVENTIL HFA;VENTOLIN HFA) 108 (90 BASE) MCG/ACT inhaler Inhale 2 puffs into the lungs every 6 (six) hours as needed. For shortness of breath   Yes Historical Provider, MD  diphenhydrAMINE (BENADRYL) 25 mg capsule Take 50 mg by mouth every 6 (six) hours as needed. For ithcing   Yes Historical Provider, MD  gabapentin (NEURONTIN) 300 MG capsule Take 300 mg by mouth 2 (two) times daily.   Yes Historical Provider, MD  meclizine (ANTIVERT) 32 MG tablet Take 1 tablet (32 mg total) by mouth 3 (three) times daily. For 3 days scheduled then as needed thereafter Patient taking differently: Take 32 mg by mouth 3 (three) times daily as needed for dizziness.  03/04/15  Yes Ranelle Oyster, MD  naproxen sodium (ANAPROX) 220 MG tablet Take 440 mg by mouth 2 (two) times daily as needed (for pain).   Yes Historical Provider, MD  ondansetron (ZOFRAN) 4 MG tablet Take 4 mg by mouth every 8 (eight) hours as needed for nausea or vomiting.   Yes Historical Provider, MD  tiZANidine (ZANAFLEX)  4 MG tablet Take 1 tablet (4 mg total) by mouth every 6 (six) hours as needed for muscle spasms. 11/12/14  Yes Ranelle OysterZachary T Swartz, MD  traMADol (ULTRAM) 50 MG tablet Take 50 mg by mouth every 6 (six) hours as needed (for pain.).   Yes Historical Provider, MD  ARIPiprazole (ABILIFY) 5 MG tablet Take 5 mg by mouth daily. Reported on 04/29/2015    Historical Provider, MD  ondansetron (ZOFRAN ODT) 4 MG disintegrating tablet 4mg  ODT q4 hours prn nausea/vomit Patient not taking: Reported on 04/29/2015  03/02/15   Joycie PeekBenjamin Cartner, PA-C  ondansetron (ZOFRAN ODT) 4 MG disintegrating tablet 4mg  ODT q4 hours prn nausea/vomit Patient not taking: Reported on 04/29/2015 04/28/15   Melene Planan Floyd, DO  ondansetron (ZOFRAN) 4 MG tablet Take 1 tablet (4 mg total) by mouth every 6 (six) hours. Patient not taking: Reported on 04/29/2015 04/23/15   Eyvonne MechanicJeffrey Hedges, PA-C   Physical Exam: Filed Vitals:   04/30/15 0230 04/30/15 0245 04/30/15 0320 04/30/15 0633  BP: 111/58 107/52 110/61 101/78  Pulse: 110 124  108  Temp:   98.9 F (37.2 C) 98.9 F (37.2 C)  TempSrc:   Oral Oral  Resp: 11 23  20   Weight:   64 kg (141 lb 1.5 oz)   SpO2: 100% 100% 100% 99%    Wt Readings from Last 3 Encounters:  04/30/15 64 kg (141 lb 1.5 oz)  03/01/15 66.225 kg (146 lb)  02/18/14 57.607 kg (127 lb)    General:  Appears calm and comfortable Eyes:  PERRL, EOMI, normal lids, iris ENT:  grossly normal hearing, lips & tongue Neck:  no LAD, masses or thyromegaly Cardiovascular:  RRR, no m/r/g. No LE edema.  Respiratory:  CTA bilaterally, no w/r/r. Normal respiratory effort. Abdomen:  soft, ntnd Skin:  no rash or induration seen on limited exam Musculoskeletal:  grossly normal tone BUE/BLE Psychiatric:  grossly normal mood and affect, speech fluent and appropriate Neurologic:  CN 2-12 grossly intact, moves all extremities in coordinated fashion.          Labs on Admission:  Basic Metabolic Panel:  Recent Labs Lab 04/23/15 1001 04/23/15 1403 04/28/15 0008 04/29/15 2309 04/30/15 0053  NA 128* 137 129* 132*  --   K 3.5 3.8 3.6 2.4*  --   CL 90* 105 95* 100*  --   CO2 16* 16* 13* 16*  --   GLUCOSE 104* 93 67 100*  --   BUN 6 <5* 6 <5*  --   CREATININE 0.90 0.73 0.88 0.87  --   CALCIUM 9.9 8.8* 9.0 9.1  --   MG  --   --   --   --  1.6*   Liver Function Tests:  Recent Labs Lab 04/23/15 1001 04/28/15 0137 04/30/15 0053  AST 61* 16 18  ALT 30 21 15   ALKPHOS 102 61 54  BILITOT 1.1 1.3* 0.5  PROT 8.3* 6.7 5.5*   ALBUMIN 4.6 3.8 2.8*    Recent Labs Lab 04/23/15 1001 04/28/15 0137  LIPASE 19 15   No results for input(s): AMMONIA in the last 168 hours. CBC:  Recent Labs Lab 04/23/15 1001 04/28/15 0008 04/29/15 2309  WBC 12.4* 14.1* 11.3*  HGB 14.4 12.9 12.4  HCT 38.6 36.5 34.4*  MCV 91.3 94.6 91.2  PLT 440* 562* 570*   Cardiac Enzymes: No results for input(s): CKTOTAL, CKMB, CKMBINDEX, TROPONINI in the last 168 hours.  BNP (last 3 results) No results for input(s): BNP in the  last 8760 hours.  ProBNP (last 3 results) No results for input(s): PROBNP in the last 8760 hours.   CREATININE: 0.87 (04/29/15 2309) Estimated creatinine clearance - 82.4 mL/min  CBG: No results for input(s): GLUCAP in the last 168 hours.  Radiological Exams on Admission: Dg Chest 2 View  04/29/2015  CLINICAL DATA:  Chest pain, body aches and vomiting EXAM: CHEST  2 VIEW COMPARISON:  Radiograph 04/28/2015 FINDINGS: Normal mediastinum and cardiac silhouette. Normal pulmonary vasculature. No evidence of effusion, infiltrate, or pneumothorax. No acute bony abnormality. IMPRESSION: No acute cardiopulmonary process. Electronically Signed   By: Genevive Bi M.D.   On: 04/29/2015 23:39      Assessment/Plan Principal Problem:   Chest pain Active Problems:   Prolonged QT syndrome   Viral gastroenteritis   Schizophrenia (HCC)   Hypokalemia  Chest pain: likely from reflux esophagitis but cannot r/o cardiac etiology. EKG showing prolonged QT. Numerous electrolyte abnormalities from recent gastroenterities. Trop neg.  - Tele - cycle trop - EKG in am - hold QT prolonging medications  - GI cocktail  Viral gastroenteritis: improving - phenergan - clear liquid diet, ADAT - probiotic  Metabolic derangements: hypokalemia 2.4, hyponatremia 132, Hypochloremic alkalosis, CO2 16. Anion gap likely from infectious etiology. Mg given in ED - IVF - KCL - BMET at 1700 and am  Schizophrenia: at baseline -  continue Abilify  MSK/Neuropathic pain: scoliosis. At baseline. Home Zanaflex contraindicated given QT - start robaxin - continue tramadol - voltaren gel - pt/OT - Neurontin    Code Status: FULL  DVT Prophylaxis: Lovenox Family Communication: none Disposition Plan: Pending Improvement    Kollin Udell Shela Commons, MD Family Medicine [facility] [email or web address] Password [email or web address]

## 2015-04-30 NOTE — Care Management Note (Signed)
Case Management Note  Patient Details  Name: Laura Montoya MRN: [id number] Date of Birth: 1985/05/18  Subjective/Objective:                    Action/Plan:  Pt is from home with parents, pt uses cane at home to assist with ambulation due to pain from sciatic nerve.  Pt has medicaid and therefore insurance will not cover recommended PT, pt refused outpt PT; stated she only wanted services that her insurance will pay for.  PT made aware.   Expected Discharge Date:                  Expected Discharge Plan:  Skilled Nursing Facility (Pt is from home with parents, uses cane to assist with ambulation)  In-House Referral:     Discharge planning Services  CM Consult  Post Acute Care Choice:    Choice offered to:     DME Arranged:    DME Agency:     HH Arranged:    HH Agency:     Status of Service:  In process, will continue to follow  Medicare Important Message Given:    Date Medicare IM Given:    Medicare IM give by:    Date Additional Medicare IM Given:    Additional Medicare Important Message give by:     If discussed at Long Length of Stay Meetings, dates discussed:    Additional Comments:  Cherylann ParrClaxton, Eather Chaires S, RN 04/30/2015, 4:01 PM

## 2015-04-30 NOTE — Progress Notes (Signed)
0325- Pt stated 8/10 constant chest pressure. No PRN orders for pain medication. Paged MD to clarify if it should be treated as cardiac. Given GI cocktail. Effective.  Sandrea HammondJunris Charli Halle RN

## 2015-04-30 NOTE — Evaluation (Signed)
Physical Therapy Evaluation Patient Details Name: Laura Montoya MRN: [id number] DOB: [birth year] Today's Date: 04/30/2015   History of Present Illness  30 y.o. female admitted with complaints of CP. She reports a history of nausea, vomiting, diarrhea, and abdominal pain prior to admission. PMH: asthma, scoliosis, schizophrenia.   Clinical Impression  Pt admitted with above diagnosis. Pt currently with functional limitations due to the deficits listed below (see PT Problem List). Pt will benefit from skilled PT to increase their independence and safety with mobility to allow discharge to the venue listed below. Patient reporting that she had limited ambulation and activity tolerance prior to admission and is scheduled for further medical consultations regarding her ongoing hip/knee pain as well as ongoing dizziness. Currently patient able to ambulate 20 feet with min guard assistance. The patient is anticipating D/C to home with family assistance when medically stable. At current mobility level, the patient does needed assistance at home for safety with mobility. Encouraging patient to continue ambulation with staff to improve activity tolerance and safety.       Follow Up Recommendations Supervision for mobility/OOB;Home health PT    Equipment Recommendations  None recommended by PT;Other (comment) (patient reports having all equipment at home)    Recommendations for Other Services       Precautions / Restrictions Precautions Precautions: Fall Precaution Comments: Patient reports an ongoing history of dizziness which was occuring prior to admission and she as been working with her physician regarding.  Restrictions Weight Bearing Restrictions: No      Mobility  Bed Mobility Overal bed mobility: Needs Assistance Bed Mobility: Supine to Sit;Sit to Supine     Supine to sit: Supervision;HOB elevated (approx 20 degrees) Sit to supine: Supervision   General bed mobility comments:  patient moving slowly and reports hip and knee pain  Transfers Overall transfer level: Needs assistance Equipment used: None Transfers: Sit to/from Stand Sit to Stand: Min guard         General transfer comment: min guard for stability with initial standing. Patient reports feeling a little dizzy but this has been typical for her over the last several months.   Ambulation/Gait Ambulation/Gait assistance: Min guard Ambulation Distance (Feet): 20 Feet Assistive device:  (IV pole per patient request, declined rw) Gait Pattern/deviations: Trunk flexed;Decreased step length - right;Decreased step length - left Gait velocity: decreased   General Gait Details: slow pattern with flexed trunk and decreased stride length bilaterally. Mild instability noted but no gross loss of balance.   Stairs            Wheelchair Mobility    Modified Rankin (Stroke Patients Only)       Balance Overall balance assessment: Needs assistance Sitting-balance support: No upper extremity supported Sitting balance-Leahy Scale: Fair     Standing balance support: Single extremity supported Standing balance-Leahy Scale: Poor Standing balance comment: using IV, noted instability but no LOB                             Pertinent Vitals/Pain Pain Assessment: 0-10 Pain Score: 6  Pain Location: chest, knees and hips Pain Descriptors / Indicators: Other (Comment) (just hurts) Pain Intervention(s): Limited activity within patient's tolerance;Monitored during session    Home Living Family/patient expects to be discharged to:: Private residence Living Arrangements: Parent;Children Available Help at Discharge: Family Type of Home: House Home Access: Stairs to enter Entrance Stairs-Rails: Right Entrance Stairs-Number of Steps: 4 Home Layout: One level  Home Equipment: Walker - 2 wheels;Cane - single point;Wheelchair - manual      Prior Function Level of Independence: Needs assistance    Gait / Transfers Assistance Needed: using cane with ambulation, limited distance of approximately 30 feet.  ADL's / Homemaking Assistance Needed: assist by mother with meal prep  Comments: Assistance by mother with washing hair.     Hand Dominance        Extremity/Trunk Assessment   Upper Extremity Assessment: Generalized weakness           Lower Extremity Assessment: Generalized weakness         Communication   Communication: No difficulties  Cognition Arousal/Alertness: Awake/alert Behavior During Therapy: WFL for tasks assessed/performed Overall Cognitive Status: Within Functional Limits for tasks assessed                      General Comments      Exercises        Assessment/Plan    PT Assessment Patient needs continued PT services  PT Diagnosis Difficulty walking;Generalized weakness;Abnormality of gait;Acute pain   PT Problem List Decreased strength;Decreased activity tolerance;Decreased balance;Decreased mobility;Decreased range of motion;Pain  PT Treatment Interventions DME instruction;Gait training;Stair training;Functional mobility training;Therapeutic activities;Therapeutic exercise;Balance training;Patient/family education   PT Goals (Current goals can be found in the Care Plan section) Acute Rehab PT Goals Patient Stated Goal: Be able to move with less pain again PT Goal Formulation: With patient Time For Goal Achievement: 05/14/15 Potential to Achieve Goals: Good    Frequency Min 3X/week   Barriers to discharge        Co-evaluation               End of Session   Activity Tolerance: Patient limited by fatigue;Patient limited by pain Patient left: in bed;with call bell/phone within reach;Other (comment) (declined sitting up in chair after session. ) Nurse Communication: Mobility status         Time: [id number] PT Time Calculation (min) (ACUTE ONLY): 18 min   Charges:   PT Evaluation $PT Eval Moderate Complexity:  1 Procedure     PT G Codes:        Christiane HaBenjamin J. Greyden Besecker, PT, CSCS Pager [phone] Office [phone]  04/30/2015, 2:22 PM

## 2015-05-01 DIAGNOSIS — R079 Chest pain, unspecified: Secondary | ICD-10-CM

## 2015-05-01 DIAGNOSIS — A084 Viral intestinal infection, unspecified: Principal | ICD-10-CM

## 2015-05-01 DIAGNOSIS — E876 Hypokalemia: Secondary | ICD-10-CM

## 2015-05-01 LAB — COMPREHENSIVE METABOLIC PANEL
ALT: 13 U/L — ABNORMAL LOW (ref 14–54)
ANION GAP: 10 (ref 5–15)
AST: 17 U/L (ref 15–41)
Albumin: 2.6 g/dL — ABNORMAL LOW (ref 3.5–5.0)
Alkaline Phosphatase: 50 U/L (ref 38–126)
BILIRUBIN TOTAL: 0.4 mg/dL (ref 0.3–1.2)
BUN: <5 mg/dL — ABNORMAL LOW (ref 6–20)
CO2: 23 mmol/L (ref 22–32)
Calcium: 8.3 mg/dL — ABNORMAL LOW (ref 8.9–10.3)
Chloride: 105 mmol/L (ref 101–111)
Creatinine, Ser: 0.6 mg/dL (ref 0.44–1.00)
GFR calc Af Amer: >60 mL/min (ref >60)
Glucose, Bld: 78 mg/dL (ref 65–99)
POTASSIUM: 3.5 mmol/L (ref 3.5–5.1)
Sodium: 138 mmol/L (ref 135–145)
TOTAL PROTEIN: 5 g/dL — AB (ref 6.5–8.1)

## 2015-05-01 LAB — CBC
HEMATOCRIT: 32.3 % — AB (ref 36.0–46.0)
Hemoglobin: 11 g/dL — ABNORMAL LOW (ref 12.0–15.0)
MCH: 32.4 pg (ref 26.0–34.0)
MCHC: 34.1 g/dL (ref 30.0–36.0)
MCV: 95.3 fL (ref 78.0–100.0)
Platelets: 447 10*3/uL — ABNORMAL HIGH (ref 150–400)
RBC: 3.39 MIL/uL — ABNORMAL LOW (ref 3.87–5.11)
RDW: 12.6 % (ref 11.5–15.5)
WBC: 9 10*3/uL (ref 4.0–10.5)

## 2015-05-01 LAB — TSH: TSH: 1.149 u[IU]/mL (ref 0.350–4.500)

## 2015-05-01 LAB — D-DIMER, QUANTITATIVE (NOT AT ARMC): D DIMER QUANT: 0.57 ug{FEU}/mL — AB (ref 0.00–0.50)

## 2015-05-01 LAB — MAGNESIUM: MAGNESIUM: 1.7 mg/dL (ref 1.7–2.4)

## 2015-05-01 MED ORDER — POTASSIUM CHLORIDE CRYS ER 20 MEQ PO TBCR
40.0000 meq | EXTENDED_RELEASE_TABLET | Freq: Once | ORAL | Status: AC
  Administered 2015-05-01: 40 meq via ORAL
  Filled 2015-05-01: qty 2

## 2015-05-01 MED ORDER — PROMETHAZINE HCL 25 MG PO TABS: 12.5000 mg | ORAL_TABLET | Freq: Four times a day (QID) | ORAL | Status: DC | PRN

## 2015-05-01 MED ORDER — TIZANIDINE HCL 4 MG PO TABS
4.0000 mg | ORAL_TABLET | Freq: Four times a day (QID) | ORAL | Status: DC | PRN
  Filled 2015-05-01: qty 1

## 2015-05-01 MED ORDER — GUAIFENESIN ER 600 MG PO TB12
600.0000 mg | ORAL_TABLET | Freq: Two times a day (BID) | ORAL | Status: DC | PRN
  Administered 2015-05-01: 600 mg via ORAL
  Filled 2015-05-01: qty 1

## 2015-05-01 NOTE — Discharge Summary (Signed)
DISCHARGE SUMMARY  Laura Montoya  MR#: [id number]  DOB:30-Nov-1985  Date of Admission: 04/29/2015 Date of Discharge: 05/01/2015  Attending Physician:Denelle Capurro T  Patient's NWG:NFAOZH,YQMVHQPCP:CONROY,NATHAN, PA-C  Consults:  None   Disposition: D/C home   Follow-up Appts: Follow-up Information    Follow up with CONROY,NATHAN, PA-C. Schedule an appointment as soon as possible for a visit in 1 week.   Specialty:  Physician Assistant   Contact information:   [facility] [address] [place] [address] [phone]      Tests Needing Follow-up: -recheck of Na, K+, Mg is suggested  -assessment of HR is suggested   Discharge Diagnoses: Chest pain Prolonged QT Viral gastroenteritis Hypokalemia Hypomagnesemia Hyponatremia Hypochloremic metabolic acidosis  Schizophrenia MSK/Neuropathic pain - scoliosis Sinus tachycardia   Initial presentation: 30 y.o. female who presented w/ a 9 day hx of nausea/vomiting and generalized abdominal pain. Vomiting and diarrhea resolved 1 day prior to admit. Patient continued to have nausea and abdominal pain. Patient developed substernal chest pain w/o radiation 6 days prior to her admit, described as someone stepping on her chest. Smokes a half pack per day.   Hospital Course:  Chest pain likely reflux esophagitis due to gastroenteritis - troponin negative x3 - no acute ST/T wave changes on EKG - low risk factor profile   Prolonged QT Corrected w/ correction of numerous metabolic derangements - QT 0.44 on tele   Viral gastroenteritis Sx have improved greatly - able to tolerate clear liquids and some regular diet   Hypokalemia Corrected w/ replacement   Hypomagnesemia Corrected w/ replacement  Hyponatremia Corrected w/ hydration   Hypochloremic metabolic acidosis  Corrected w/ hydration  Schizophrenia continue Abilify  MSK/Neuropathic pain - scoliosis Resume home medical tx now that QT corrected   Sinus  tachycardia Likely simply volume related - d dimer only very mildly elevated, and no other sx to suggest PE so no further w/u planned for PE - TSH normal - anxiety may also be playing a role - stop albuterol - f/u in outpt setting suggested     Medication List    STOP taking these medications        albuterol 108 (90 Base) MCG/ACT inhaler  Commonly known as:  PROVENTIL HFA;VENTOLIN HFA     ondansetron 4 MG tablet  Commonly known as:  ZOFRAN      TAKE these medications        acetaminophen 500 MG tablet  Commonly known as:  TYLENOL  Take 1,000 mg by mouth every 6 (six) hours as needed (for pain.).     ARIPiprazole 5 MG tablet  Commonly known as:  ABILIFY  Take 5 mg by mouth daily. Reported on 04/29/2015     diphenhydrAMINE 25 mg capsule  Commonly known as:  BENADRYL  Take 50 mg by mouth every 6 (six) hours as needed. For ithcing     gabapentin 300 MG capsule  Commonly known as:  NEURONTIN  Take 300 mg by mouth 2 (two) times daily.     meclizine 32 MG tablet  Commonly known as:  ANTIVERT  Take 1 tablet (32 mg total) by mouth 3 (three) times daily. For 3 days scheduled then as needed thereafter     naproxen sodium 220 MG tablet  Commonly known as:  ANAPROX  Take 440 mg by mouth 2 (two) times daily as needed (for pain).     ondansetron 4 MG disintegrating tablet  Commonly known as:  ZOFRAN ODT  4mg  ODT q4 hours prn  nausea/vomit     tiZANidine 4 MG tablet  Commonly known as:  ZANAFLEX  Take 1 tablet (4 mg total) by mouth every 6 (six) hours as needed for muscle spasms.     traMADol 50 MG tablet  Commonly known as:  ULTRAM  Take 50 mg by mouth every 6 (six) hours as needed (for pain.).       Day of Discharge BP 110/54 mmHg  Pulse 107  Temp(Src) 98.1 F (36.7 C) (Oral)  Resp 18  Wt 64 kg (141 lb 1.5 oz)  SpO2 98%  LMP 03/22/2015  Physical Exam: General: No acute respiratory distress Lungs: Clear to auscultation bilaterally without wheezes or  crackles Cardiovascular: Regular rate and rhythm without murmur gallop or rub normal S1 and S2 Abdomen: Nontender, nondistended, soft, bowel sounds positive, no rebound, no ascites, no appreciable mass Extremities: No significant cyanosis, clubbing, or edema bilateral lower extremities  Basic Metabolic Panel:  Recent Labs Lab 04/28/15 0008 04/29/15 2309 04/30/15 0053 04/30/15 1025 04/30/15 1620 05/01/15 0320 05/01/15 1150  NA 129* 132*  --   --  132* 138  --   K 3.6 2.4*  --   --  4.7 3.5  --   CL 95* 100*  --   --  103 105  --   CO2 13* 16*  --   --  20* 23  --   GLUCOSE 67 100*  --   --  86 78  --   BUN 6 <5*  --   --  <5* <5*  --   CREATININE 0.88 0.87  --  0.71 0.60 0.60  --   CALCIUM 9.0 9.1  --   --  8.0* 8.3*  --   MG  --   --  1.6*  --   --   --  1.7    Liver Function Tests:  Recent Labs Lab 04/28/15 0137 04/30/15 0053 05/01/15 0320  AST 16 18 17   ALT 21 15 13*  ALKPHOS 61 54 50  BILITOT 1.3* 0.5 0.4  PROT 6.7 5.5* 5.0*  ALBUMIN 3.8 2.8* 2.6*    Recent Labs Lab 04/28/15 0137  LIPASE 15   CBC:  Recent Labs Lab 04/28/15 0008 04/29/15 2309 05/01/15 0320  WBC 14.1* 11.3* 9.0  HGB 12.9 12.4 11.0*  HCT 36.5 34.4* 32.3*  MCV 94.6 91.2 95.3  PLT 562* 570* 447*    Cardiac Enzymes:  Recent Labs Lab 04/30/15 1025 04/30/15 1620  TROPONINI <0.03 0.03     Time spent in discharge (includes decision making & examination of pt): >30 minutes  05/01/2015, 3:13 PM   Lonia Blood, MD [facility] Office  [phone] Pager [phone]  On-Call/Text Page:      [email or web address]      password [facility]

## 2015-05-01 NOTE — Evaluation (Signed)
Occupational Therapy Evaluation Patient Details Name: Laura GullyMelissa Ann Faux MRN: [id number] DOB: 1985-06-03 Today's Date: 05/01/2015    History of Present Illness 30 y.o. female admitted wiht complaints of CP. She reports a history of nausea, vomiting, diarrhea, and abdominal pain prior to admission. PMH: asthma, scoliosis, schizophrenia.    Clinical Impression   Patient presenting with deconditioning. Patient independent to mod I PTA. Patient currently functioning at an overall supervision level. Patient will benefit from acute OT to increase overall independence in the areas of ADLs, functional mobility, continued education on safety with showers using DME, and overall safety in order to safely discharge home with assistance prn from parents.   Pt reports sitting in tub/shower due to anxiety of falling in shower. Pt will benefit from tub transfer bench for safety with transfer in/out of shower and safety while taking a shower.     Follow Up Recommendations  No OT follow up;Supervision - Intermittent    Equipment Recommendations  Tub/shower bench    Recommendations for Other Services  None at this time   Precautions / Restrictions Precautions Precautions: Fall Precaution Comments: Patient reports an ongoing history of dizziness which was occuring prior to admission and she as been working with her physician regarding.  Restrictions Weight Bearing Restrictions: No    Mobility Bed Mobility Overal bed mobility: Modified Independent Bed Mobility: Supine to Sit;Sit to Supine General bed mobility comments: mod I for increased pain, pt did not report pain during bed mobility   Transfers Overall transfer level: Needs assistance   Transfers: Sit to/from Stand Sit to Stand: Supervision General transfer comment: Close supervision for safety. Pt with no reports of dizziness, but did report this has been an issue for the past 2 months and she states she has an appointment with a neurologist  soon.     Balance Overall balance assessment: Needs assistance Sitting-balance support: No upper extremity supported;Feet supported Sitting balance-Leahy Scale: Good     Standing balance support: No upper extremity supported;During functional activity Standing balance-Leahy Scale: Fair Standing balance comment: some instability, but no LOB    ADL Overall ADL's : Needs assistance/impaired General ADL Comments: Pt overall supervision for ADLs and functional mobility. Pt preferred not to use RW during this OT eval, pt pused IV pole from bed to/from BR for toilet transfer. Discussed tub transfer bench in tub/shower combo and administerred handout for patient so she safetly transfers in/out of shower and safely take showers.      Vision Vision Assessment?: No apparent visual deficits          Pertinent Vitals/Pain Pain Assessment: No/denies pain     Hand Dominance Right   Extremity/Trunk Assessment Upper Extremity Assessment Upper Extremity Assessment: Generalized weakness   Lower Extremity Assessment Lower Extremity Assessment: Generalized weakness   Cervical / Trunk Assessment Cervical / Trunk Assessment: Normal   Communication Communication Communication: No difficulties   Cognition Arousal/Alertness: Awake/alert Behavior During Therapy: WFL for tasks assessed/performed Overall Cognitive Status: Within Functional Limits for tasks assessed              Home Living Family/patient expects to be discharged to:: Private residence Living Arrangements: Parent;Children Available Help at Discharge: Family Type of Home: House Home Access: Stairs to enter Enter[employer] CorporationEntrance Stairs-Number of Steps: 4 Entrance Stairs-Rails: Right Home Layout: One level     Bathroom Shower/Tub: Tub/shower unit;Curtain   Bathroom Toilet: Handicapped height     Home Equipment: Environmental consultantWalker - 2 wheels;Cane - single point;Wheelchair - manual;Toilet riser   Additional  Comments: Pt reports toilet riser  is her father's. Therapist encouraged pt NOT to use riser at it does not bolt into seat.       Prior Functioning/Environment Level of Independence: Needs assistance  Gait / Transfers Assistance Needed: using cane with ambulation, limited distance of approximately 30 feet. ADL's / Homemaking Assistance Needed: assist by mother with meal prep   Comments: Assistance by mother with washing hair.    OT Diagnosis: Generalized weakness   OT Problem List: Decreased strength;Decreased activity tolerance;Impaired balance (sitting and/or standing);Decreased safety awareness;Decreased knowledge of use of DME or AE   OT Treatment/Interventions: Self-care/ADL training;Therapeutic exercise;Energy conservation;DME and/or AE instruction;Therapeutic activities;Patient/family education;Balance training    OT Goals(Current goals can be found in the care plan section) Acute Rehab OT Goals Patient Stated Goal: go home this afternoon  OT Goal Formulation: With patient Time For Goal Achievement: 05/08/15 Potential to Achieve Goals: Good ADL Goals Pt Will Perform Grooming: with modified independence;standing Pt Will Transfer to Toilet: with modified independence;ambulating;regular height toilet Pt Will Perform Tub/Shower Transfer: Tub transfer;ambulating;tub bench;with modified independence (LRAD) Pt/caregiver will Perform Home Exercise Program: Increased strength;Both right and left upper extremity;With written HEP provided;With theraband;With Supervision Additional ADL Goal #1: Pt will be mod I for functional mobility in room using LRAD in order to gather necessary items for ADL   OT Frequency: Min 2X/week   Barriers to D/C: Decreased caregiver support    End of Session Activity Tolerance: Patient tolerated treatment well Patient left: in bed;with call bell/phone within reach   Time: 0949-1003 OT Time Calculation (min): 14 min Charges:  OT General Charges $OT Visit: 1 Procedure OT Evaluation $OT  Eval Low Complexity: 1 Procedure  Edwin Cap , MS, OTR/L, CLT Pager: [phone]  05/01/2015, 10:17 AM

## 2015-05-01 NOTE — Discharge Instructions (Signed)
Dehydration, Adult °Dehydration is a condition in which you do not have enough fluid or water in your body. It happens when you take in less fluid than you lose. Vital organs such as the kidneys, brain, and heart cannot function without a proper amount of fluids. Any loss of fluids from the body can cause dehydration.  °Dehydration can range from mild to severe. This condition should be treated right away to help prevent it from becoming severe. °CAUSES  °This condition may be caused by: °· Vomiting. °· Diarrhea. °· Excessive sweating, such as when exercising in hot or humid weather. °· Not drinking enough fluid during strenuous exercise or during an illness. °· Excessive urine output. °· Fever. °· Certain medicines. °RISK FACTORS °This condition is more likely to develop in: °· People who are taking certain medicines that cause the body to lose excess fluid (diuretics).   °· People who have a chronic illness, such as diabetes, that may increase urination. °· Older adults.   °· People who live at high altitudes.   °· People who participate in endurance sports.   °SYMPTOMS  °Mild Dehydration °· Thirst. °· Dry lips. °· Slightly dry mouth. °· Dry, warm skin. °Moderate Dehydration °· Very dry mouth.   °· Muscle cramps.   °· Dark urine and decreased urine production.   °· Decreased tear production.   °· Headache.   °· Light-headedness, especially when you stand up from a sitting position.   °Severe Dehydration °· Changes in skin.   °¨ Cold and clammy skin.   °¨ Skin does not spring back quickly when lightly pinched and released.   °· Changes in body fluids.   °¨ Extreme thirst.   °¨ No tears.   °¨ Not able to sweat when body temperature is high, such as in hot weather.   °¨ Minimal urine production.   °· Changes in vital signs.   °¨ Rapid, weak pulse (more than 100 beats per minute when you are sitting still).   °¨ Rapid breathing.   °¨ Low blood pressure.   °· Other changes.   °¨ Sunken eyes.   °¨ Cold hands and feet.    °¨ Confusion. °¨ Lethargy and difficulty being awakened. °¨ Fainting (syncope).   °¨ Short-term weight loss.   °¨ Unconsciousness. °DIAGNOSIS  °This condition may be diagnosed based on your symptoms. You may also have tests to determine how severe your dehydration is. These tests may include:  °· Urine tests.   °· Blood tests.   °TREATMENT  °Treatment for this condition depends on the severity. Mild or moderate dehydration can often be treated at home. Treatment should be started right away. Do not wait until dehydration becomes severe. Severe dehydration needs to be treated at the hospital. °Treatment for Mild Dehydration °· Drinking plenty of water to replace the fluid you have lost.   °· Replacing minerals in your blood (electrolytes) that you may have lost.   °Treatment for Moderate Dehydration  °· Consuming oral rehydration solution (ORS). °Treatment for Severe Dehydration °· Receiving fluid through an IV tube.   °· Receiving electrolyte solution through a feeding tube that is passed through your nose and into your stomach (nasogastric tube or NG tube). °· Correcting any abnormalities in electrolytes. °HOME CARE INSTRUCTIONS  °· Drink enough fluid to keep your urine clear or pale yellow.   °· Drink water or fluid slowly by taking small sips. You can also try sucking on ice cubes.  °· Have food or beverages that contain electrolytes. Examples include bananas and sports drinks. °· Take over-the-counter and prescription medicines only as told by your health care provider.   °· Prepare ORS according to the manufacturer's instructions. Take sips   of ORS every 5 minutes until your urine returns to normal. °· If you have vomiting or diarrhea, continue to try to drink water, ORS, or both.   °· If you have diarrhea, avoid:   °¨ Beverages that contain caffeine.   °¨ Fruit juice.   °¨ Milk.    °¨ Carbonated soft drinks. °· Do not take salt tablets. This can lead to the condition of having too much sodium in your body  (hypernatremia).   °SEEK MEDICAL CARE IF: °· You cannot eat or drink without vomiting. °· You have had moderate diarrhea during a period of more than 24 hours. °· You have a fever. °SEEK IMMEDIATE MEDICAL CARE IF:  °· You have extreme thirst. °· You have severe diarrhea. °· You have not urinated in 6-8 hours, or you have urinated only a small amount of very dark urine. °· You have shriveled skin. °· You are dizzy, confused, or both. °  °This information is not intended to replace advice given to you by your health care provider. Make sure you discuss any questions you have with your health care provider. °  °Document Released: 04/11/2005 Document Revised: 12/31/2014 Document Reviewed: 08/27/2014 °Elsevier Interactive Patient Education ©2016 [employer] ° °Nonspecific Tachycardia °Tachycardia is a faster than normal heartbeat (more than 100 beats per minute). In adults, the heart normally beats between 60 and 100 times a minute. A fast heartbeat may be a normal response to exercise or stress. It does not necessarily mean that something is wrong. However, sometimes when your heart beats too fast it may not be able to pump enough blood to the rest of your body. This can result in chest pain, shortness of breath, dizziness, and even fainting. Nonspecific tachycardia means that the specific cause or pattern of your tachycardia is unknown. °CAUSES  °Tachycardia may be harmless or it may be due to a more serious underlying cause. Possible causes of tachycardia include: °· Exercise or exertion. °· Fever. °· Pain or injury. °· Infection. °· Loss of body fluids (dehydration). °· Overactive thyroid. °· Lack of red blood cells (anemia). °· Anxiety and stress. °· Alcohol. °· Caffeine. °· Tobacco products. °· Diet pills. °· Illegal drugs. °· Heart disease. °SYMPTOMS °· Rapid or irregular heartbeat (palpitations). °· Suddenly feeling your heart beating (cardiac awareness). °· Dizziness. °· Tiredness (fatigue). °· Shortness of  breath. °· Chest pain. °· Nausea. °· Fainting. °DIAGNOSIS  °Your caregiver will perform a physical exam and take your medical history. In some cases, a heart specialist (cardiologist) may be consulted. Your caregiver may also order: °· Blood tests. °· Electrocardiography. This test records the electrical activity of your heart. °· A heart monitoring test. °TREATMENT  °Treatment will depend on the likely cause of your tachycardia. The goal is to treat the underlying cause of your tachycardia. Treatment methods may include: °· Replacement of fluids or blood through an intravenous (IV) tube for moderate to severe dehydration or anemia. °· New medicines or changes in your current medicines. °· Diet and lifestyle changes. °· Treatment for certain infections. °· Stress relief or relaxation methods. °HOME CARE INSTRUCTIONS  °· Rest. °· Drink enough fluids to keep your urine clear or pale yellow. °· Do not smoke. °· Avoid: °¨ Caffeine. °¨ Tobacco. °¨ Alcohol. °¨ Chocolate. °¨ Stimulants such as over-the-counter diet pills or pills that help you stay awake. °¨ Situations that cause anxiety or stress. °¨ Illegal drugs such as marijuana, phencyclidine (PCP), and cocaine. °· Only take medicine as directed by your caregiver. °· Keep all follow-up appointments   follow-up appointments as directed by your caregiver. SEEK IMMEDIATE MEDICAL CARE IF:   You have pain in your chest, upper arms, jaw, or neck.  You become weak, dizzy, or feel faint.  You have palpitations that will not go away.  You vomit, have diarrhea, or pass blood in your stool.  Your skin is cool, pale, and wet.  You have a fever that will not go away with rest, fluids, and medicine. MAKE SURE YOU:   Understand these instructions.  Will watch your condition.  Will get help right away if you are not doing well or get worse.   This information is not intended to replace advice given to you by your health care provider. Make sure you discuss any questions you have with  your health care provider.   Document Released: 05/19/2004 Document Revised: 07/04/2011 Document Reviewed: 10/24/2014 Elsevier Interactive Patient Education [employer]2016 [employer] Inc[employer]

## 2015-05-01 NOTE — Progress Notes (Deleted)
TEAM 1 - Stepdown/ICU TEAM PROGRESS NOTE  Laura Montoya WUJ:[id number] DOB: 1985/06/28 DOA: 04/29/2015 PCP: Arlyss Queen  Admit HPI / Brief Narrative: 30 y.o. female who presented w/ a 9 day hx of nausea/vomiting and generalized abdominal pain. Vomiting and diarrhea resolved 1 day prior to admit. Patient continued to have nausea and abdominal pain. Patient developed substernal chest pain w/o radiation 6 days prior to her admit, described as someone stepping on her chest. Smokes a half pack per day.   HPI/Subjective: Pain now resolved.  C/o feeling racing heart.  Denies chest pressure, vomiting, diarrhea, or abdom pain.    Assessment/Plan:  Chest pain likely reflux esophagitis - troponin negative x3 - no acute ST/T wave changes on EKG - low risk factor profile   Prolonged QT Corrected w/ correction of numerous metabolic derangements - QT 0.44 on tele   Viral gastroenteritis Sx have improved greatly - able to tolerate clear liquids and some regular diet   Hypokalemia Corrected w/ replacement   Hypomagnesemia Corrected w/ replacement  Hyponatremia Corrected w/ hydration   Hypochloremic metabolic acidosis  Corrected w/ hydration  Schizophrenia continue Abilify  MSK/Neuropathic pain - scoliosis Resume home medical tx now that QT corrected   Code Status: FULL Family Communication: no family present at time of exam Disposition Plan: plan for d/c home later today if able to eat lunch and lytes/EKG remain stable   Consultants: none  Procedures: none  Antibiotics: none  DVT prophylaxis: lovenox   Objective: Blood pressure 107/56, pulse 103, temperature 97.9 F (36.6 C), temperature source Oral, resp. rate 18, weight 64 kg (141 lb 1.5 oz), last menstrual period 03/22/2015, SpO2 100 %.  Intake/Output Summary (Last 24 hours) at 05/01/15 0853 Last data filed at 04/30/15 1700  Gross per 24 hour  Intake    340 ml  Output      0 ml  Net    340 ml     Exam: General: No acute respiratory distress Lungs: Clear to auscultation bilaterally without wheezes or crackles Cardiovascular: Regular rate and rhythm without murmur gallop or rub normal S1 and S2 Abdomen: Nontender, nondistended, soft, bowel sounds positive, no rebound, no ascites, no appreciable mass Extremities: No significant cyanosis, clubbing, or edema bilateral lower extremities  Data Reviewed:  Basic Metabolic Panel:  Recent Labs Lab 04/28/15 0008 04/29/15 2309 04/30/15 0053 04/30/15 1025 04/30/15 1620 05/01/15 0320  NA 129* 132*  --   --  132* 138  K 3.6 2.4*  --   --  4.7 3.5  CL 95* 100*  --   --  103 105  CO2 13* 16*  --   --  20* 23  GLUCOSE 67 100*  --   --  86 78  BUN 6 <5*  --   --  <5* <5*  CREATININE 0.88 0.87  --  0.71 0.60 0.60  CALCIUM 9.0 9.1  --   --  8.0* 8.3*  MG  --   --  1.6*  --   --   --    CBC:  Recent Labs Lab 04/28/15 0008 04/29/15 2309 05/01/15 0320  WBC 14.1* 11.3* 9.0  HGB 12.9 12.4 11.0*  HCT 36.5 34.4* 32.3*  MCV 94.6 91.2 95.3  PLT 562* 570* 447*   Liver Function Tests:  Recent Labs Lab 04/28/15 0137 04/30/15 0053 05/01/15 0320  AST 16 18 17   ALT 21 15 13*  ALKPHOS 61 54 50  BILITOT 1.3* 0.5 0.4  PROT 6.7 5.5*  5.0*  ALBUMIN 3.8 2.8* 2.6*    Recent Labs Lab 04/28/15 0137  LIPASE 15   Cardiac Enzymes:  Recent Labs Lab 04/30/15 1025 04/30/15 1620  TROPONINI <0.03 0.03    Studies:   Recent x-ray studies have been reviewed in detail by the Attending Physician  Scheduled Meds:  Scheduled Meds: . acidophilus  2 capsule Oral Daily  . ARIPiprazole  5 mg Oral Daily  . enoxaparin (LOVENOX) injection  40 mg Subcutaneous Q24H  . gabapentin  300 mg Oral BID  . sodium chloride  3 mL Intravenous Q12H    Time spent on care of this patient: 35 mins   MCCLUNG,JEFFREY T , MD   [facility] Office  [phone] Pager - Text Page per Loretha StaplerAmion as per below:  On-Call/Text Page:      [email or web address]       password [email or web address]  If 7PM-7AM, please contact night-coverage [email or web address] Password [email or web address] 05/01/2015, 8:53 AM   LOS: 1 day

## 2015-05-03 ENCOUNTER — Telehealth: Payer: Self-pay

## 2015-05-03 NOTE — Telephone Encounter (Signed)
LM that the office is closed 05/04/15 due to weather and to all us back on Tuesday to get rescheduled.

## 2015-05-04 ENCOUNTER — Institutional Professional Consult (permissible substitution): Payer: Medicaid Other | Admitting: Neurology

## 2015-05-04 ENCOUNTER — Encounter: Payer: Medicaid Other | Attending: Physical Medicine & Rehabilitation | Admitting: Registered Nurse

## 2015-05-04 DIAGNOSIS — M129 Arthropathy, unspecified: Secondary | ICD-10-CM | POA: Insufficient documentation

## 2015-05-04 DIAGNOSIS — G8929 Other chronic pain: Secondary | ICD-10-CM | POA: Insufficient documentation

## 2015-05-04 DIAGNOSIS — F17200 Nicotine dependence, unspecified, uncomplicated: Secondary | ICD-10-CM | POA: Insufficient documentation

## 2015-05-04 DIAGNOSIS — M545 Low back pain: Secondary | ICD-10-CM | POA: Insufficient documentation

## 2015-05-04 DIAGNOSIS — F419 Anxiety disorder, unspecified: Secondary | ICD-10-CM | POA: Insufficient documentation

## 2015-05-04 DIAGNOSIS — F329 Major depressive disorder, single episode, unspecified: Secondary | ICD-10-CM | POA: Insufficient documentation

## 2015-05-04 DIAGNOSIS — M419 Scoliosis, unspecified: Secondary | ICD-10-CM | POA: Insufficient documentation

## 2015-05-04 DIAGNOSIS — R42 Dizziness and giddiness: Secondary | ICD-10-CM | POA: Insufficient documentation

## 2015-05-04 DIAGNOSIS — J45909 Unspecified asthma, uncomplicated: Secondary | ICD-10-CM | POA: Insufficient documentation

## 2015-05-04 DIAGNOSIS — Z9889 Other specified postprocedural states: Secondary | ICD-10-CM | POA: Insufficient documentation

## 2015-05-04 DIAGNOSIS — H8113 Benign paroxysmal vertigo, bilateral: Secondary | ICD-10-CM | POA: Insufficient documentation

## 2015-05-04 DIAGNOSIS — F200 Paranoid schizophrenia: Secondary | ICD-10-CM | POA: Insufficient documentation

## 2015-05-07 ENCOUNTER — Ambulatory Visit
Admission: RE | Admit: 2015-05-07 | Discharge: 2015-05-07 | Disposition: A | Discharge status: Home / Self Care | Payer: Medicaid Other | Source: Ambulatory Visit | Attending: Physician Assistant | Admitting: Physician Assistant

## 2015-05-07 ENCOUNTER — Other Ambulatory Visit: Payer: Self-pay | Admitting: Physician Assistant

## 2015-05-07 DIAGNOSIS — R600 Localized edema: Secondary | ICD-10-CM

## 2015-05-08 ENCOUNTER — Ambulatory Visit (INDEPENDENT_AMBULATORY_CARE_PROVIDER_SITE_OTHER): Payer: Medicaid Other | Admitting: Gastroenterology

## 2015-05-08 ENCOUNTER — Encounter: Payer: Self-pay | Admitting: Gastroenterology

## 2015-05-08 VITALS — BP 100/62 | HR 88 | Ht 62.0 in | Wt 141.4 lb

## 2015-05-08 DIAGNOSIS — R1013 Epigastric pain: Secondary | ICD-10-CM

## 2015-05-08 DIAGNOSIS — A084 Viral intestinal infection, unspecified: Secondary | ICD-10-CM | POA: Diagnosis not present

## 2015-05-08 DIAGNOSIS — R1310 Dysphagia, unspecified: Secondary | ICD-10-CM

## 2015-05-08 MED ORDER — OMEPRAZOLE 40 MG PO CPDR: 40.0000 mg | DELAYED_RELEASE_CAPSULE | Freq: Every day | ORAL | Status: DC

## 2015-05-08 NOTE — Patient Instructions (Signed)
You have been scheduled for an endoscopy. Please follow written instructions given to you at your visit today. If you use inhalers (even only as needed), please bring them with you on the day of your procedure. Your physician has requested that you go to [email or web address] and enter the access code given to you at your visit today. This web site gives a general overview about your procedure. However, you should still follow specific instructions given to you by our office regarding your preparation for the procedure.   We have sent the following medications to your pharmacy for you to pick up at your convenience:  Omeprazole  

## 2015-05-08 NOTE — Progress Notes (Signed)
HPI :  30 y/o female, new to this clinic. Seen in consultation following recent hospitalization.   She was admitted in the beginning of January, she was admitted for metabolic derangements due to what was thought to have been a severe viral gastroenteritis. She reported significant vomiting and diarrhea which lasted over a week. She reported it was mostly vomiting for about a week straight, she had poor PO intake. She reported loosed stools for the first 3 days which then stopped. She reported to have a lengthed QT interval on EKG due to metabolic derangements which had normalized.   She was discharged about a week ago. She reports she has had one episode of vomiting since discharge, but mostly this has resolved. She has been on a bland diet and drinking juices, and gatorade, small amount at a time. She reports her diarrhea has stopped. She has soft stools which have been regular, no diarrhea. No blood in the stools. She reported having a small amount of blood in the vomit, red tinged, during a few episodes when hospitalized but she does not think significant. She has has had some epigastric discomfort since her symptoms started and has persisted. She reports she thought it was due to vomiting, but it has persisted since she stopped vomiting. Discomfort can come and go, rated about 6/10. She feels as if the pain is worse after she eats. The discomfort usually starts fairly quickly and lasts about an hour and resolved. No radiation of the pain. She also endorses some dysphagia and odynophagia. It occurs only mostly to solids and is intermittent, rarely to liquids. She denies any significant heartburn at this time. She denies any alcohol use for months. She denies any signficant NSAID use, she takes it occasionally every 3-4 days, for back pains. She has not needed to take any zofran for a few days.   Prior to the onset of these symptoms, she has never had them before.   No new medications prior to onset  of symptoms. Daughter had diarrhea prior to onset of patient's symptoms.      Past Medical History  Diagnosis Date  . Asthma   . Scoliosis   . Schizophrenia (HCC)   . Mood disorder (HCC)   . Anxiety   . Dysmenorrhea   . Headache   . Dizziness   . Thrush      Past Surgical History  Procedure Laterality Date  . Breast surgery    . Mouth surgery     Family History  Problem Relation Age of Onset  . Thyroid disease Mother   . Mental illness Father    Social History  Substance Use Topics  . Smoking status: Current Every Day Smoker    Types: Cigarettes  . Smokeless tobacco: None  . Alcohol Use: No   Current Outpatient Prescriptions  Medication Sig Dispense Refill  . acetaminophen (TYLENOL) 500 MG tablet Take 1,000 mg by mouth every 6 (six) hours as needed (for pain.).    Marland Kitchen ARIPiprazole (ABILIFY) 5 MG tablet Take 5 mg by mouth daily. Reported on 04/29/2015    . diphenhydrAMINE (BENADRYL) 25 mg capsule Take 50 mg by mouth every 6 (six) hours as needed. For ithcing    . gabapentin (NEURONTIN) 300 MG capsule Take 300 mg by mouth 2 (two) times daily.    . meclizine (ANTIVERT) 32 MG tablet Take 1 tablet (32 mg total) by mouth 3 (three) times daily. For 3 days scheduled then as needed thereafter (Patient taking differently:  Take 32 mg by mouth 3 (three) times daily as needed for dizziness. ) 60 tablet 1  . naproxen sodium (ANAPROX) 220 MG tablet Take 440 mg by mouth 2 (two) times daily as needed (for pain).    . ondansetron (ZOFRAN ODT) 4 MG disintegrating tablet 4mg  ODT q4 hours prn nausea/vomit 20 tablet 0  . tiZANidine (ZANAFLEX) 4 MG tablet Take 1 tablet (4 mg total) by mouth every 6 (six) hours as needed for muscle spasms. 75 tablet 3  . traMADol (ULTRAM) 50 MG tablet Take 50 mg by mouth every 6 (six) hours as needed (for pain.).    Marland Kitchen. omeprazole (PRILOSEC) 40 MG capsule Take 1 capsule (40 mg total) by mouth daily. 60 capsule 1   No current facility-administered medications for  this visit.   Allergies  Allergen Reactions  . Lavender Oil Rash     Review of Systems: All systems reviewed and negative except where noted in HPI.   Lab Results  Component Value Date   WBC 9.0 05/01/2015   HGB 11.0* 05/01/2015   HCT 32.3* 05/01/2015   MCV 95.3 05/01/2015   PLT 447* 05/01/2015    Lab Results  Component Value Date   CREATININE 0.60 05/01/2015   BUN <5* 05/01/2015   NA 138 05/01/2015   K 3.5 05/01/2015   CL 105 05/01/2015   CO2 23 05/01/2015    Lab Results  Component Value Date   ALT 13* 05/01/2015   AST 17 05/01/2015   ALKPHOS 50 05/01/2015   BILITOT 0.4 05/01/2015     Dg Chest 2 View  04/29/2015  CLINICAL DATA:  Chest pain, body aches and vomiting EXAM: CHEST  2 VIEW COMPARISON:  Radiograph 04/28/2015 FINDINGS: Normal mediastinum and cardiac silhouette. Normal pulmonary vasculature. No evidence of effusion, infiltrate, or pneumothorax. No acute bony abnormality. IMPRESSION: No acute cardiopulmonary process. Electronically Signed   By: Genevive BiStewart  Edmunds M.D.   On: 04/29/2015 23:39   Dg Chest 2 View  04/28/2015  CLINICAL DATA:  Chest pain.  Vomiting. EXAM: CHEST  2 VIEW COMPARISON:  None. FINDINGS: The cardiomediastinal contours are normal. The lungs are clear. Pulmonary vasculature is normal. No consolidation, pleural effusion, or pneumothorax. No acute osseous abnormalities are seen. IMPRESSION: No acute pulmonary process. Electronically Signed   By: Rubye OaksMelanie  Ehinger M.D.   On: 04/28/2015 01:36   Ct Abdomen Pelvis W Contrast  04/28/2015  CLINICAL DATA:  Epigastric abdominal pain for 1 week. Nausea and vomiting for 3 days. EXAM: CT ABDOMEN AND PELVIS WITH CONTRAST TECHNIQUE: Multidetector CT imaging of the abdomen and pelvis was performed using the standard protocol following bolus administration of intravenous contrast. CONTRAST:  100mL OMNIPAQUE IOHEXOL 300 MG/ML  SOLN COMPARISON:  None. FINDINGS: Lower chest:  The included lung bases are clear. Liver:  Hypodensity adjacent to the falciform ligament, likely focal fatty infiltration. No suspicious lesion. Hepatobiliary: Gallbladder physiologically distended, no calcified stone. No biliary dilatation. Pancreas: Normal.  No ductal dilatation or inflammation. Spleen: Normal. Adrenal glands: No nodule. Kidneys: Symmetric renal enhancement.  No hydronephrosis. Stomach/Bowel: Mild distal esophageal dilatation with enteric contrast. Stomach physiologically distended. There are no dilated or thickened small bowel loops. There is submucosal fatty infiltration throughout the colon without pericolonic inflammatory change. The appendix is normal. Vascular/Lymphatic: No retroperitoneal adenopathy. Abdominal aorta is normal in caliber. Reproductive: Uterus normal in size. Ovaries symmetric in size. No adnexal mass. Bladder: Distended, no wall thickening. Other: No free air, free fluid, or intra-abdominal fluid collection. Musculoskeletal: There are no  acute or suspicious osseous abnormalities. IMPRESSION: 1. Mildly patulous distal esophagus containing enteric contrast, question reflux. 2. No additional acute abnormality in the abdomen/pelvis. Submucosal fatty infiltration throughout the colon can be seen with chronic inflammation, no active inflammatory change at this time. Electronically Signed   By: Rubye Oaks M.D.   On: 04/28/2015 04:02   US Venous Img Lower Bilateral  05/07/2015  CLINICAL DATA:  Bilateral lower extremity edema EXAM: BILATERAL LOWER EXTREMITY VENOUS DOPPLER ULTRASOUND TECHNIQUE: Gray-scale sonography with graded compression, as well as color Doppler and duplex ultrasound were performed to evaluate the lower extremity deep venous systems from the level of the common femoral vein and including the common femoral, femoral, profunda femoral, popliteal and calf veins including the posterior tibial, peroneal and gastrocnemius veins when visible. The superficial great saphenous vein was also interrogated.  Spectral Doppler was utilized to evaluate flow at rest and with distal augmentation maneuvers in the common femoral, femoral and popliteal veins. COMPARISON:  None. FINDINGS: RIGHT LOWER EXTREMITY Common Femoral Vein: No evidence of thrombus. Normal compressibility, respiratory phasicity and response to augmentation. Saphenofemoral Junction: No evidence of thrombus. Normal compressibility and flow on color Doppler imaging. Profunda Femoral Vein: No evidence of thrombus. Normal compressibility and flow on color Doppler imaging. Femoral Vein: No evidence of thrombus. Normal compressibility, respiratory phasicity and response to augmentation. Popliteal Vein: No evidence of thrombus. Normal compressibility, respiratory phasicity and response to augmentation. Calf Veins: No evidence of thrombus. Normal compressibility and flow on color Doppler imaging. Superficial Great Saphenous Vein: No evidence of thrombus. Normal compressibility and flow on color Doppler imaging. Venous Reflux:  None. Other Findings:  None. LEFT LOWER EXTREMITY Common Femoral Vein: No evidence of thrombus. Normal compressibility, respiratory phasicity and response to augmentation. Saphenofemoral Junction: No evidence of thrombus. Normal compressibility and flow on color Doppler imaging. Profunda Femoral Vein: No evidence of thrombus. Normal compressibility and flow on color Doppler imaging. Femoral Vein: No evidence of thrombus. Normal compressibility, respiratory phasicity and response to augmentation. Popliteal Vein: No evidence of thrombus. Normal compressibility, respiratory phasicity and response to augmentation. Calf Veins: No evidence of thrombus. Normal compressibility and flow on color Doppler imaging. Superficial Great Saphenous Vein: No evidence of thrombus. Normal compressibility and flow on color Doppler imaging. Venous Reflux:  None. Other Findings:  None. IMPRESSION: No evidence of deep venous thrombosis. Electronically Signed   By:  Alcide Clever M.D.   On: 05/07/2015 15:45    Physical Exam: BP 100/62 mmHg  Pulse 88  Ht 5\' 2"  (1.575 m)  Wt 141 lb 6 oz (64.127 kg)  BMI 25.85 kg/m2  LMP 03/22/2015 Constitutional: Pleasant,well-developed, female in no acute distress. HEENT: Normocephalic and atraumatic. Conjunctivae are normal. No scleral icterus. Neck supple.  Cardiovascular: Normal rate, regular rhythm.  Pulmonary/chest: Effort normal and breath sounds normal. No wheezing, rales or rhonchi. Abdominal: Soft, nondistended, mild epigastric to LUQ TTP without rebound or guarding, negative Carnett. Bowel sounds active throughout. There are no masses palpable. No hepatomegaly. Extremities: (+) 1 edema LE B  LE BLymphadenopathy: No cervical adenopathy noted. Neurological: Alert and oriented to person place and time. Skin: Skin is warm and dry. No rashes noted. Psychiatric: Normal mood and affect. Behavior is normal.   ASSESSMENT AND PLAN: 30 y/o female with history as above, recently admitted to the hospital in early January with a presumed severe viral gastroenteritis. She had significant electrolyte imbalances leading to abnormal QT interval which resolved. She appears to be improved since her hospitalization, however she  continues to have postprandial epigastric discomfort which has not improved. She otherwise is also having dysphagia as described above.   At this time, given her ongoing postprandial symptoms, will add omeprazole 40mg  once daily to see if this helps. I discussed upper endoscopy with her and will add her to the schedule to further evaluate these complaints and her dysphagia if they persist over time. If her symptoms abate with omeprazole and she feels better, she can let us know and we can cancel it in this situation. I reassured her that she does appear to be getting better from her gastroenteritis and expect her to continue to do so. She can advance her diet as tolerated otherwise in the upcoming days. If  she has recurrence or worsening of symptoms in the interim she should contact us. I advised her to avoid NSAIDs in the interim.   The indications, risks, and benefits of EGD were explained to the patient in detail. Risks include but are not limited to bleeding, perforation, adverse reaction to medications, and cardiopulmonary compromise. Sequelae include but are not limited to the possibility of surgery, hospitalization, and mortality. The patient verbalized understanding and wished to proceed. All questions answered, referred to scheduler. Further recommendations pending results of the exam.   Ileene Patrick, MD [facility] Pager [phone]   CC: Lonie Peak, PA-C

## 2015-05-11 ENCOUNTER — Encounter (HOSPITAL_BASED_OUTPATIENT_CLINIC_OR_DEPARTMENT_OTHER): Payer: Medicaid Other | Admitting: Registered Nurse

## 2015-05-11 ENCOUNTER — Encounter: Payer: Self-pay | Admitting: Registered Nurse

## 2015-05-11 ENCOUNTER — Other Ambulatory Visit: Payer: Self-pay

## 2015-05-11 VITALS — BP 133/77 | HR 130 | Resp 14

## 2015-05-11 DIAGNOSIS — R109 Unspecified abdominal pain: Secondary | ICD-10-CM

## 2015-05-11 DIAGNOSIS — R42 Dizziness and giddiness: Secondary | ICD-10-CM | POA: Diagnosis not present

## 2015-05-11 DIAGNOSIS — G609 Hereditary and idiopathic neuropathy, unspecified: Secondary | ICD-10-CM

## 2015-05-11 DIAGNOSIS — M419 Scoliosis, unspecified: Secondary | ICD-10-CM | POA: Diagnosis not present

## 2015-05-11 DIAGNOSIS — M797 Fibromyalgia: Secondary | ICD-10-CM | POA: Diagnosis not present

## 2015-05-11 DIAGNOSIS — J45909 Unspecified asthma, uncomplicated: Secondary | ICD-10-CM | POA: Diagnosis not present

## 2015-05-11 DIAGNOSIS — M4716 Other spondylosis with myelopathy, lumbar region: Secondary | ICD-10-CM | POA: Diagnosis not present

## 2015-05-11 DIAGNOSIS — M545 Low back pain: Secondary | ICD-10-CM | POA: Diagnosis present

## 2015-05-11 DIAGNOSIS — M129 Arthropathy, unspecified: Secondary | ICD-10-CM | POA: Diagnosis not present

## 2015-05-11 DIAGNOSIS — K529 Noninfective gastroenteritis and colitis, unspecified: Secondary | ICD-10-CM

## 2015-05-11 DIAGNOSIS — Z9889 Other specified postprocedural states: Secondary | ICD-10-CM | POA: Diagnosis not present

## 2015-05-11 DIAGNOSIS — M7918 Myalgia, other site: Secondary | ICD-10-CM

## 2015-05-11 DIAGNOSIS — F419 Anxiety disorder, unspecified: Secondary | ICD-10-CM | POA: Diagnosis not present

## 2015-05-11 DIAGNOSIS — H8113 Benign paroxysmal vertigo, bilateral: Secondary | ICD-10-CM | POA: Diagnosis not present

## 2015-05-11 DIAGNOSIS — F200 Paranoid schizophrenia: Secondary | ICD-10-CM | POA: Diagnosis not present

## 2015-05-11 DIAGNOSIS — F329 Major depressive disorder, single episode, unspecified: Secondary | ICD-10-CM | POA: Diagnosis not present

## 2015-05-11 DIAGNOSIS — G8929 Other chronic pain: Secondary | ICD-10-CM | POA: Diagnosis not present

## 2015-05-11 DIAGNOSIS — F17200 Nicotine dependence, unspecified, uncomplicated: Secondary | ICD-10-CM | POA: Diagnosis not present

## 2015-05-11 MED ORDER — AMITRIPTYLINE HCL 10 MG PO TABS: 10.0000 mg | ORAL_TABLET | Freq: Every day | ORAL | Status: DC

## 2015-05-11 MED ORDER — TIZANIDINE HCL 4 MG PO TABS: 4.0000 mg | ORAL_TABLET | Freq: Four times a day (QID) | ORAL | Status: DC | PRN

## 2015-05-11 NOTE — Progress Notes (Signed)
Subjective:    Patient ID: Laura Montoya, female    DOB: 1985/12/07, 30 y.o.   MRN: [id number]  HPI:Laura Montoya is a 30 year old female who returns for follow up for chronic pain and medication refill. She says her pain is located in her bilateral hands and bilateral lower extremities radiating into bilateral feet.She rates her pain 7. Her current exercise regime is performing stretching exercises and walking with her walker. Arrived tachycardic apical pulse 108. Laura Montoya went to  [facility] ED on 03/01/2015 for migraine she was treated and released. She went to [facility] ED on 04/23/2015 with complaints nausea, vomiting and diarrhea. She was treated and prescribed Zofran. Not using tramadol, tramadol discontinued. Mother in room.  Pain Inventory Average Pain 8 Pain Right Now 7 My pain is constant, sharp, burning and stabbing  In the last 24 hours, has pain interfered with the following? General activity 8 Relation with others 7 Enjoyment of life 9 What TIME of day is your pain at its worst? evening Sleep (in general) Poor  Pain is worse with: walking, bending, standing and some activites Pain improves with: rest, heat/ice, medication and injections Relief from Meds: no selection  Mobility walk with assistance use a cane use a walker ability to climb steps?  yes do you drive?  no needs help with transfers Do you have any goals in this area?  yes  Function not employed: date last employed . I need assistance with the following:  dressing, bathing, meal prep, household duties and shopping Do you have any goals in this area?  yes  Neuro/Psych weakness numbness trouble walking spasms dizziness anxiety  Prior Studies Any changes since last visit?  yes x-rays CT/MRI  Physicians involved in your care Any changes since last visit?  yes   Family History  Problem Relation Age of Onset  . Thyroid disease Mother   . Mental illness Father     Social History   Social History  . Marital Status: Single    Spouse Name: N/A  . Number of Children: N/A  . Years of Education: N/A   Social History Main Topics  . Smoking status: Current Every Day Smoker    Types: Cigarettes  . Smokeless tobacco: None  . Alcohol Use: No  . Drug Use: No  . Sexual Activity: Not Asked   Other Topics Concern  . None   Social History Narrative   Past Surgical History  Procedure Laterality Date  . Breast surgery    . Mouth surgery     Past Medical History  Diagnosis Date  . Asthma   . Scoliosis   . Schizophrenia (HCC)   . Mood disorder (HCC)   . Anxiety   . Dysmenorrhea   . Headache   . Dizziness   . Thrush    BP 133/77 mmHg  Pulse 130  Resp 14  SpO2 97%  LMP 03/22/2015  Opioid Risk Score:   Fall Risk Score:  `1  Depression screen PHQ 2/9  Depression screen PHQ 2/9 11/12/2014  Decreased Interest 1  Down, Depressed, Hopeless 0  PHQ - 2 Score 1     Review of Systems  Constitutional: Positive for appetite change and unexpected weight change.  Cardiovascular: Positive for leg swelling.  Gastrointestinal: Positive for nausea, vomiting and abdominal pain.  Musculoskeletal: Positive for gait problem.       Spasms   Neurological: Positive for dizziness, weakness and numbness.  Psychiatric/Behavioral: The patient is  nervous/anxious.   All other systems reviewed and are negative.      Objective:   Physical Exam  Constitutional: She is oriented to person, place, and time. She appears well-developed and well-nourished.  HENT:  Head: Normocephalic and atraumatic.  Neck: Normal range of motion. Neck supple.  Cardiovascular: Normal rate and regular rhythm.   Pulmonary/Chest: Effort normal and breath sounds normal.  Musculoskeletal:  Normal Muscle Bulk and Muscle Testing Reveals: Upper Extremities: Full ROM and Muscle Strength 4/5 Lumbar Paraspinal Tenderness: L-3- L-5 Lower Extremities: Full ROM and Muscle Strength  5/5 Bilateral lower extremity flexion produces pain into lower extremities Arises from chair with ease using walker for support Narrow Based gait  Neurological: She is alert and oriented to person, place, and time.  Skin: Skin is warm and dry.  Psychiatric: She has a normal mood and affect.  Nursing note and vitals reviewed.         Assessment & Plan:  1. Chronic low back pain, SIJ pain: No complaints today. .Continue with exercise regime and continue to monitor 2. Myofascial Pain : Continue with Heat and Exercise Therapy 3. Muscle Spasms: Continue Zanaflex  4. Neuropathy: RX: Elavil  20 minutes of face to face patient care time was spent during this visit. All questions were encouraged and answered.   F/U in 3 months.

## 2015-05-14 ENCOUNTER — Telehealth: Payer: Self-pay

## 2015-05-14 NOTE — Telephone Encounter (Signed)
Spoke with Laura Montoya regarding her neuropathic pain she was instructed to increase amitriptyline to two tablets ( ) at HS. Also instructed to call office on 1/23 to evaluate she verbalizes understanding.

## 2015-05-14 NOTE — Telephone Encounter (Signed)
Pt would like you to know that her Elavil is not helping her with her neuropathy. Would like to know what she can do next. Thanks!

## 2015-05-15 ENCOUNTER — Encounter: Payer: Self-pay | Admitting: Gastroenterology

## 2015-05-15 ENCOUNTER — Ambulatory Visit (AMBULATORY_SURGERY_CENTER): Payer: Medicaid Other | Admitting: Gastroenterology

## 2015-05-15 VITALS — BP 108/60 | HR 82 | Temp 97.8°F | Resp 24

## 2015-05-15 DIAGNOSIS — R1084 Generalized abdominal pain: Secondary | ICD-10-CM | POA: Diagnosis not present

## 2015-05-15 DIAGNOSIS — K2990 Gastroduodenitis, unspecified, without bleeding: Secondary | ICD-10-CM

## 2015-05-15 DIAGNOSIS — K2970 Gastritis, unspecified, without bleeding: Secondary | ICD-10-CM | POA: Diagnosis not present

## 2015-05-15 MED ORDER — SODIUM CHLORIDE 0.9 % IV SOLN: 500.0000 mL | INTRAVENOUS | Status: DC

## 2015-05-15 NOTE — Patient Instructions (Signed)
YOU HAD AN ENDOSCOPIC PROCEDURE TODAY AT THE [facility]:   Refer to the procedure report that was given to you for any specific questions about what was found during the examination.  If the procedure report does not answer your questions, please call your gastroenterologist to clarify.  If you requested that your care partner not be given the details of your procedure findings, then the procedure report has been included in a sealed envelope for you to review at your convenience later.  YOU SHOULD EXPECT: Some feelings of bloating in the abdomen. Passage of more gas than usual.  Walking can help get rid of the air that was put into your GI tract during the procedure and reduce the bloating.   Please Note:  You might notice some irritation and congestion in your nose or some drainage.  This is from the oxygen used during your procedure.  There is no need for concern and it should clear up in a day or so.  SYMPTOMS TO REPORT IMMEDIATELY:    Following upper endoscopy (EGD)  Vomiting of blood or coffee ground material  New chest pain or pain under the shoulder blades  Painful or persistently difficult swallowing  New shortness of breath  Fever of 100F or higher  Black, tarry-looking stools  For urgent or emergent issues, a gastroenterologist can be reached at any hour by calling [phone].   DIET: Your first meal following the procedure should be a small meal and then it is ok to progress to your normal diet. Heavy or fried foods are harder to digest and may make you feel nauseous or bloated.  Likewise, meals heavy in dairy and vegetables can increase bloating.  Drink plenty of fluids but you should avoid alcoholic beverages for 24 hours.  ACTIVITY:  You should plan to take it easy for the rest of today and you should NOT DRIVE or use heavy machinery until tomorrow (because of the sedation medicines used during the test).    FOLLOW UP: Our staff will call the number listed  on your records the next business day following your procedure to check on you and address any questions or concerns that you may have regarding the information given to you following your procedure. If we do not reach you, we will leave a message.  However, if you are feeling well and you are not experiencing any problems, there is no need to return our call.  We will assume that you have returned to your regular daily activities without incident.  If any biopsies were taken you will be contacted by phone or by letter within the next 1-3 weeks.  Please call us at [phone] if you have not heard about the biopsies in 3 weeks.    SIGNATURES/CONFIDENTIALITY: You and/or your care partner have signed paperwork which will be entered into your electronic medical record.  These signatures attest to the fact that that the information above on your After Visit Summary has been reviewed and is understood.  Full responsibility of the confidentiality of this discharge information lies with you and/or your care-partner.  Read all of the handouts given to you by your recovery room nurse.   No NSAIDS meaning: aspirin, aleve, or ibuprofen per Dr. Adela Lank.

## 2015-05-15 NOTE — Progress Notes (Signed)
Report to PACU, RN, vss, BBS= Clear.  

## 2015-05-15 NOTE — Op Note (Signed)
[facility] [address] [place], [address]   ENDOSCOPY PROCEDURE REPORT  PATIENT: Laura, Montoya  MR#: [id number] BIRTHDATE: 07-09-1985 , 29  yrs. old GENDER: female ENDOSCOPIST: Benancio Deeds, MD REFERRED BY: PROCEDURE DATE:  05/15/2015 PROCEDURE:  EGD w/ biopsy ASA CLASS:     Class II INDICATIONS:  dysphagia and dyspepsia. MEDICATIONS: Propofol 200 mg IV TOPICAL ANESTHETIC:  DESCRIPTION OF PROCEDURE: After the risks benefits and alternatives of the procedure were thoroughly explained, informed consent was obtained.  The LB [id number] endoscope was introduced through the mouth and advanced to the second portion of the duodenum , Without limitations.  The instrument was slowly withdrawn as the mucosa was fully examined.   FINDINGS: The esophagus was normal without stenosis or stricture. No overt inflammatory changes were noted but biopsies were taken from the lower and mid esophagus to ensure no evidence of eosinophilic esophagitis.  The [facility] was noted 38cm from the incisors, with SCJ and GEJ noted 36cm from the incisors, with a 2cm hiatal hernia.  The gastric mucosa was noted for erythema in the gastric body without erosions or ulcerations.  Biopsies were taken from the antrum and body to rule out H pylori.  The remainder of the stomach was normal.  The duodenal bulb and 2nd portion of the examined duodenum were normal.  Retroflexed views revealed no abnormalities. The scope was then withdrawn from the patient and the procedure completed.  COMPLICATIONS: There were no immediate complications.  ENDOSCOPIC IMPRESSION: Normal esophagus without stenosis / stricture - biopsies taken to rule out EoE 2cm hiatal hernia Mild gastritis without erosions or ulcerations. Biopsies obtained to rule out H pylori Normal duodenum  RECOMMENDATIONS: Resume diet Resume medications (prilosec) Avoid NSAIDs Await pathology results If dysphagia persists  and biopsies of the esophagus are normal, will recommend esophageal manometry to rule out dysmotility.    eSigned:  Benancio Deeds, MD 05/15/2015 2:20 PM    CC: the patient  PATIENT NAME:  Laura Montoya, Laura Montoya MR#: [id number]

## 2015-05-15 NOTE — Progress Notes (Signed)
Called to room to assist during endoscopic procedure.  Patient ID and intended procedure confirmed with present staff. Received instructions for my participation in the procedure from the performing physician.  

## 2015-05-18 ENCOUNTER — Telehealth: Payer: Self-pay | Admitting: *Deleted

## 2015-05-18 NOTE — Telephone Encounter (Signed)
  Follow up Call-  Call back number 05/15/2015  Post procedure Call Back phone  # [phone]  Permission to leave phone message Yes     Patient questions:  Do you have a fever, pain , or abdominal swelling? No. Pain Score  0 *  Have you tolerated food without any problems? Yes.    Have you been able to return to your normal activities? Yes.    Do you have any questions about your discharge instructions: Diet   No. Medications  No. Follow up visit  No.  Do you have questions or concerns about your Care? No.  Actions: * If pain score is 4 or above: No action needed, pain <4.

## 2015-05-18 NOTE — Telephone Encounter (Signed)
Message for Laura Montoya, amitriptyline increase is not working. Pt still having bad neuropathic nerve pain

## 2015-05-19 ENCOUNTER — Telehealth: Payer: Self-pay

## 2015-05-19 ENCOUNTER — Institutional Professional Consult (permissible substitution): Payer: Medicaid Other | Admitting: Neurology

## 2015-05-19 ENCOUNTER — Telehealth: Payer: Self-pay | Admitting: *Deleted

## 2015-05-19 NOTE — Telephone Encounter (Signed)
B[place]a called back to speak with Riley Lam. I attempted to reach her but had to leave a message that Riley Lam will call tomorrow.

## 2015-05-19 NOTE — Telephone Encounter (Signed)
Return Ms. Rossano call, left message to return the call.

## 2015-05-19 NOTE — Telephone Encounter (Signed)
I spoke to patient and rescheduled her appt due to Dr. Frances Furbish out sick. Rescheduled to 05/27/15.

## 2015-05-20 ENCOUNTER — Telehealth: Payer: Self-pay | Admitting: Registered Nurse

## 2015-05-20 MED ORDER — NORTRIPTYLINE HCL 25 MG PO CAPS: 25.0000 mg | ORAL_CAPSULE | Freq: Every day | ORAL | Status: DC

## 2015-05-20 NOTE — Telephone Encounter (Signed)
Spoke with Laura Montoya the amitriptyline ineffective. She was prescribed gabapentin in the pass and develop side effect of pitting edema . Will discontinued amitriptyline, will prescribed Pamelor. She verbalizes understanding. Instructed to call office on Monday 05/25/2015 to evaluate treatment regime she verbalizes understanding.

## 2015-05-20 NOTE — Telephone Encounter (Signed)
I return Laura Montoya call no answer, will await her call.

## 2015-05-26 ENCOUNTER — Telehealth: Payer: Self-pay | Admitting: *Deleted

## 2015-05-26 MED ORDER — ACETAMINOPHEN-CODEINE #3 300-30 MG PO TABS: 1.0000 | ORAL_TABLET | Freq: Two times a day (BID) | ORAL | Status: DC | PRN

## 2015-05-26 NOTE — Telephone Encounter (Signed)
Spoke with Laura Montoya she states she has been experiencing slight relief with the Pamelor, we will increase Pamelor to 50 mg QHS. Also states she's experiencing pain in her lower back, bilateral knees and bilateral ankles. She was previously receiving tramadol and states no relief was noted. We will prescribe Tylenol #3 today. She verbalizes understanding.  Tylenol #3 phone in.  Instructed to call office to evaluate treatment relief. She verbalizes understanding.

## 2015-05-26 NOTE — Telephone Encounter (Signed)
Called for [place], asking for a call back regarding nortriptyline

## 2015-05-27 ENCOUNTER — Ambulatory Visit (INDEPENDENT_AMBULATORY_CARE_PROVIDER_SITE_OTHER): Payer: Medicaid Other | Admitting: Neurology

## 2015-05-27 ENCOUNTER — Encounter: Payer: Self-pay | Admitting: Neurology

## 2015-05-27 VITALS — BP 102/64 | HR 98 | Resp 18 | Ht 62.0 in | Wt 125.0 lb

## 2015-05-27 DIAGNOSIS — K529 Noninfective gastroenteritis and colitis, unspecified: Secondary | ICD-10-CM

## 2015-05-27 DIAGNOSIS — G629 Polyneuropathy, unspecified: Secondary | ICD-10-CM

## 2015-05-27 DIAGNOSIS — R531 Weakness: Secondary | ICD-10-CM

## 2015-05-27 DIAGNOSIS — R292 Abnormal reflex: Secondary | ICD-10-CM

## 2015-05-27 NOTE — Patient Instructions (Addendum)
We will do some blood work and call you with the test results. We will do an EMG and nerve conduction velocity test, which is an electrical nerve and muscle test, which we will schedule. We will call you with the results. Please use your walker at all times. Please stay well-hydrated. Please stop smoking. We may need to do additional testing, sometimes we need to do a spinal tap to get to the bottom of the cause for neuropathy. If you have any chest pain, significant palpitations, or sudden shortness of breath, you have to call 911 or have someone take you to the emergency room immediately.

## 2015-05-27 NOTE — Progress Notes (Signed)
Subjective:    Patient ID: Laura Montoya is a 30 y.o. female.  HPI     Interim history:   Laura Montoya is a 30 year old right-handed woman with an underlying medical history of mood disorder, scoliosis, smoking, asthma, chronic pain including back pain and neck pain, followed by physical medicine and rehabilitation for pain, who is referred by her PCP for neuropathy. I have previously seen her once on 11/15/2013 for intermittent hand tremors. She had evidence of mild tremors at the time, I felt this was most likely an enhanced physiologic tremor particularly in the context of anxiety. We did some blood work which was fine. We called her with her results and I suggested an as needed follow-up at the time.  Today, 05/27/2015: She is accompanied by her father today. She reports paresthesias and numbness in both legs and now hands. This started in November 2016 and has been progressive. She has some burning pain with this. She has some discomfort. She feels weaker. First Sx were pins and needless in the feet for weeks, then traveled up to above knee. She has has tingling in the fingers 1-3, mostly at night. She feels weak, has not fallen. Of note, she had a stomach virus in December. She was admitted to the hospital on 04/29/2015 and discharged on 05/01/2015. I reviewed the hospital records. Cardiac workup was negative, she had prolonged QT which resolved. She was diagnosed with gastroenteritis and she was noted to have hypokalemia, hypomagnesemia, hyponatremia, hypochloremic metabolic acidosis, all of which were corrected.  she had a recent thyroid function screen with TSH and B12 level checked with her primary care provider. Her most recent B12 level was normal.  I reviewed her primary care office note from 04/07/2015. She was placed on gabapentin. She had a brain MRI without contrast on 04/04/2015: IMPRESSION: 1. Normal noncontrast Normal MRI appearance of the brain. 2. Mild left mastoid and  bilateral paranasal sinus inflammatory changes. Her B12 was 223 on 03/03/2015. She had a repeat B12 level. She was started on gabapentin 300 mg twice daily. Recent CMP was unremarkable with the exception of BUN of 4, chloride of 93. TSH was in the normal range.  She states that she took the gabapentin for about a month but about a week ago she was switched to nortriptyline. She is trying to hydrate. She has reduced her soda intake. She still smokes.  She denies any breathing difficulties or swallowing difficulties but does report that when she was in the hospital with a gastroenteritis she felt it was harder to swallow. She has seen a GI doctor after this. She has been diagnosed with a hiatal hernia and gastritis.  Previously:   11/15/2013: She has been experiencing an intermittent generalized, but more R sided trembling for the past 3 years. She has noticed it more when she lays down to sleep and sometimes, it prevents her from falling asleep. She takes Benadryl at night for itching, but it does not help her go to sleep. Her MGM has PD and her mother worries about her having PD. She lives with her parents and her 6 yo daughter. She is divorced. She has a Dx schizoaffective D/O and sees Dr. Deatra Robinson at [facility]. She has been on Abilify for 4 months and on Effexor XR 6-8 months. Prior to that she was on Lexapro, Seroquel. She has been on Trazodone for sleep. She admits to being a nervous person and anxiety flares up the tremor. She smokes 1/2 ppd and  does not drink alcohol daily. Occasionally she smokes marijuana, which she states helps with the anxiety and the tremors. She has a FHx of bone density problems and has lost all her teeth some 2 years ago. She has not noted a voice or jaw tremor.    Her Past Medical History Is Significant For: Past Medical History  Diagnosis Date  . Asthma   . Scoliosis   . Schizophrenia (HCC)   . Mood disorder (HCC)   . Anxiety   . Dysmenorrhea   . Headache   .  Dizziness   . Thrush     Her Past Surgical History Is Significant For: Past Surgical History  Procedure Laterality Date  . Breast surgery    . Mouth surgery      Her Family History Is Significant For: Family History  Problem Relation Age of Onset  . Thyroid disease Mother   . Mental illness Father     Her Social History Is Significant For: Social History   Social History  . Marital Status: Single    Spouse Name: N/A  . Number of Children: 1  . Years of Education: GED   Occupational History  . N/A    Social History Main Topics  . Smoking status: Current Every Day Smoker    Types: Cigarettes  . Smokeless tobacco: None  . Alcohol Use: No  . Drug Use: No  . Sexual Activity: Not Asked   Other Topics Concern  . None   Social History Narrative   Has not had caffeine for 1 month 05/27/15.     Her Allergies Are:  Allergies  Allergen Reactions  . Lavender Oil Rash  :   Her Current Medications Are:  Outpatient Encounter Prescriptions as of 05/27/2015  Medication Sig  . acetaminophen-codeine (TYLENOL #3) 300-30 MG tablet Take 1 tablet by mouth 2 (two) times daily as needed for moderate pain.  . clonazePAM (KLONOPIN) 0.5 MG tablet TAKE 1 TABLET TWICE A DAY AS NEEDED FOR ANXIETY  . diphenhydrAMINE (BENADRYL) 25 mg capsule Take 50 mg by mouth every 6 (six) hours as needed. For ithcing  . meclizine (ANTIVERT) 32 MG tablet Take 1 tablet (32 mg total) by mouth 3 (three) times daily. For 3 days scheduled then as needed thereafter (Patient taking differently: Take 32 mg by mouth 3 (three) times daily as needed for dizziness. )  . nortriptyline (PAMELOR) 25 MG capsule Take 1 capsule (25 mg total) by mouth at bedtime. (Patient taking differently: Take 50 mg by mouth at bedtime. )  . omeprazole (PRILOSEC) 40 MG capsule Take 1 capsule (40 mg total) by mouth daily.  . ondansetron (ZOFRAN ODT) 4 MG disintegrating tablet  ODT q4 hours prn nausea/vomit  . tiZANidine (ZANAFLEX) 4 MG  tablet Take 1 tablet (4 mg total) by mouth every 6 (six) hours as needed for muscle spasms.  . [DISCONTINUED] acetaminophen (TYLENOL) 500 MG tablet Take 1,000 mg by mouth every 6 (six) hours as needed (for pain.).  . [DISCONTINUED] ARIPiprazole (ABILIFY) 5 MG tablet Take 5 mg by mouth daily. Reported on 04/29/2015   No facility-administered encounter medications on file as of 05/27/2015.  :  Review of Systems:  Out of a complete 14 point review of systems, all are reviewed and negative with the exception of these symptoms as listed below:   Review of Systems  Neurological:       Patient reports that her legs feel like "spegetti noodles". States that her legs and hands are weak  and have a tingling sensation.     Objective:  Neurologic Exam  Physical Exam Physical Examination:   Filed Vitals:   05/27/15 0931  BP: 102/64  Pulse: 98  Resp: 18    General Examination: The patient is a very pleasant 30 y.o. female in no acute distress. She appears well-developed and well-nourished and marginally groomed.   HEENT: Normocephalic, atraumatic, pupils are equal, round and reactive to light and accommodation. Funduscopic exam is normal with sharp disc margins noted. Extraocular tracking is good without limitation to gaze excursion or nystagmus noted. Normal smooth pursuit is noted. Hearing is grossly intact. Face is symmetric with normal facial animation and normal facial sensation. Speech is clear with no dysarthria noted. There is no hypophonia. There is no lip, neck/head, jaw or voice tremor. Neck is supple with full range of passive and active motion. There are no carotid bruits on auscultation. Oropharynx exam reveals: Moderate mouth dryness, edentulous state, and mild airway crowding, due to narrow airway entry and tonsils in place and longer tongue. Mallampati is class I. Tongue protrudes centrally and palate elevates symmetrically. Tonsils are 1+ in size.   Chest: Clear to auscultation without  wheezing, rhonchi or crackles noted.  Heart: S1+S2+0, regular and normal without murmurs, rubs or gallops noted. She is mildly tachycardic. She states that she has had a faster heart rate for the past month.  Abdomen: Soft, non-tender and non-distended with normal bowel sounds appreciated on auscultation.  Extremities: There is no pitting edema in the distal lower extremities bilaterally. Pedal pulses are intact.  Skin: Warm and dry without trophic changes noted. There are no varicose veins.  Musculoskeletal: exam reveals no obvious joint deformities, tenderness or joint swelling or erythema.   Neurologically:  Mental status: The patient is awake, alert and oriented in all 4 spheres. Her immediate and remote memory, attention, language skills and fund of knowledge are appropriate. There is no evidence of aphasia, agnosia, apraxia or anomia. Speech is clear with normal prosody and enunciation. Thought process is linear. Mood is anxious and constricted and affect is constricted.  Cranial nerves II - XII are as described above under HEENT exam. In addition: shoulder shrug is normal with equal shoulder height noted. Motor exam: Thin bulk, tone seems a little lower than normal. Strength is 4+ out of 5 throughout, she seems to make a good enough effort. There is no drift or rebound. She has no resting tremor. Romberg is negative. Reflexes are 1+ in the upper extremities, diminished in the lower extremities, probably absent in both ankles, toes are equivocal bilaterally. Fine motor skills are fairly well-preserved. Toe and heel stance are not easy for her. She has a mild postural and action tremor, this seems stable from before.  Cerebellar testing: No dysmetria or intention tremor on finger to nose testing. Heel to shin is unremarkable bilaterally. There is no truncal or gait ataxia.  Sensory exam: intact to light touch, pinprick, vibration, temperature sense in the upper extremities but she has  decreased pinprick sensation in the distal lower extremities up to above the knees.  Gait, station and balance: She stands with mild difficulty. She stands wide-based. She uses a 2 wheeled walker. Tandem walk is not possible for her.                Assessment and Plan:   In summary, Laura Montoya is a very pleasant 30 year old female with an underlying medical history of mood disorder, scoliosis, smoking, asthma, chronic pain  including back pain and neck pain, followed by physical medicine and rehabilitation for pain, who is referred by her PCP for neuropathy.  On examination, she has mild global weakness, it appears to be more distal. She has evidence of diminished reflexes, particularly in the lower extremities and decreased sensation in the lower extremities. She has had fairly fast progression. Eventual diagnosis includes an acute demyelinating neuropathy such as Guillain barre. We will do additional testing including blood work and EMG and nerve conduction testing. She did have a gastroenteritis but her symptoms actually preceded this from what I understand. She was in the hospital recently for a gastro-enteritis and had multiple electrolyte disturbances and some EKG changes which were all corrected, cardiac workup for a coronary event was negative. She has a stable tremor. We will call her with all her test results and may proceed with additional testing such as lumbar puncture with spinal fluid testing if needed. She is advised to stay well-hydrated, she is advised to stop smoking and use her walker at all times. I will see her back after these tests are done and keep her posted over the phone as to her test results. I answered all their questions today and the patient and her father were in agreement. They are advised to call 911 or proceed to the emergency room immediately should she have any significant palpitations, chest pain or shortness of breath. I spent 40 minutes in total face-to-face  time with the patient, more than 50% of which was spent in counseling and coordination of care, reviewing test results, reviewing medication and discussing or reviewing the diagnosis of progressive neuropathy.

## 2015-05-28 ENCOUNTER — Telehealth: Payer: Self-pay | Admitting: *Deleted

## 2015-05-28 NOTE — Progress Notes (Signed)
Quick Note:  Some blood test results are pending, most are back and normal. Please call patient. Vitamin D level was mildly low at 27.4 (normal range is around 30-100). I would recommend that patient start an OTC Vitamin D supplement: 1000-2000 units daily of any vitamin D supplement of Her choice should be fine. I would recommend recheck of vitamin D status in 3-6 months with PCP.  Huston Foley, MD, PhD [facility]    ______

## 2015-05-28 NOTE — Telephone Encounter (Signed)
-----   Message from Huston Foley, MD sent at 05/28/2015  2:09 PM EST ----- Some blood test results are pending, most are back and normal. Please call patient. Vitamin D level was mildly low at 27.4 (normal range is around 30-100). I would recommend that patient start an OTC Vitamin D supplement: 1000-2000 units daily of any vitamin D supplement of Her choice should be fine. I would recommend recheck of vitamin D status in 3-6 months with PCP.  Huston Foley, MD, PhD [facility])

## 2015-05-28 NOTE — Telephone Encounter (Signed)
Called and spoke to pt about lab results per Dr Frances Furbish note. Pt verbalized understanding and she is going to start OTC Vit. D 1000-2000units. Told her we will call once rest of labs come back. Added medication to med list. She will f/u with PCP for repeat labs in 3-6 months.

## 2015-05-29 ENCOUNTER — Encounter (INDEPENDENT_AMBULATORY_CARE_PROVIDER_SITE_OTHER): Payer: Self-pay | Admitting: Diagnostic Neuroimaging

## 2015-05-29 ENCOUNTER — Telehealth: Payer: Self-pay | Admitting: Neurology

## 2015-05-29 ENCOUNTER — Ambulatory Visit (INDEPENDENT_AMBULATORY_CARE_PROVIDER_SITE_OTHER): Payer: Medicaid Other | Admitting: Diagnostic Neuroimaging

## 2015-05-29 DIAGNOSIS — R292 Abnormal reflex: Secondary | ICD-10-CM

## 2015-05-29 DIAGNOSIS — Z0289 Encounter for other administrative examinations: Secondary | ICD-10-CM

## 2015-05-29 DIAGNOSIS — R531 Weakness: Secondary | ICD-10-CM | POA: Diagnosis not present

## 2015-05-29 DIAGNOSIS — K529 Noninfective gastroenteritis and colitis, unspecified: Secondary | ICD-10-CM

## 2015-05-29 DIAGNOSIS — R202 Paresthesia of skin: Secondary | ICD-10-CM

## 2015-05-29 DIAGNOSIS — G629 Polyneuropathy, unspecified: Secondary | ICD-10-CM

## 2015-05-29 NOTE — Telephone Encounter (Signed)
Please call patient: she should also start Thiamine supplement OTC: 100 mg daily. We will have to recheck in 2-3 months or through PCP.

## 2015-05-29 NOTE — Procedures (Signed)
   [facility]  NCS (NERVE CONDUCTION STUDY) WITH EMG (ELECTROMYOGRAPHY) REPORT   STUDY DATE: 05/29/15 PATIENT NAME: Laura Montoya DOB: 1985-11-20 MRN: [id number]  ORDERING CLINICIAN: Huston Foley, MD PhD   TECHNOLOGIST: Gearldine Shown  ELECTROMYOGRAPHER: Glenford Bayley. Zaviyar Rahal, MD  CLINICAL INFORMATION: 30 year old female with lower extremity numbness, cramps and pain and mild upper extremity numbness. Evaluate for neuropathy such as Guillain Barre Syndrome.  FINDINGS: NERVE CONDUCTION STUDY: Bilateral median, bilateral ulnar, left peroneal and left tibial motor responses and F wave latencies are normal. Bilateral H reflex responses are normal. Bilateral median, bilateral ulnar, left sural sensory responses are normal.   NEEDLE ELECTROMYOGRAPHY: Needle examination of selected muscles demonstrates: Right vastus medialis, right tibialis anterior, right deltoid, right first dorsal interosseous muscles are normal. Bilateral gastrocnemius muscles demonstrate 3+ positive sharp waves and fibrillation potentials at rest and decreased recruitment of large motor units on exertion. Right flexor carpi radialis tendons 1+ positive sharp waves and fibrillation potentials at rest and normal motor unit recruitment on exertion. Right L5-S1 paraspinal muscles are normal.   IMPRESSION:  Abnormal study demonstrate: 1. 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  2. Normal nerve conduction studies. 3. Given the clinical context considerations would include cervical and lumbar polyradiculopathies vs improving/recovering polyneuropathy.     INTERPRETING PHYSICIAN:  Suanne Marker, MD Certified in Neurology, Neurophysiology and Neuroimaging  [facility] [address] [place], [place] [address] [phone]

## 2015-05-30 LAB — VITAMIN D 25 HYDROXY (VIT D DEFICIENCY, FRACTURES): Vit D, 25-Hydroxy: 27.4 ng/mL — ABNORMAL LOW (ref 30.0–100.0)

## 2015-05-30 LAB — CK: CK TOTAL: 42 U/L (ref 24–173)

## 2015-05-30 LAB — HEAVY METALS PROFILE II, BLOOD
ARSENIC: 6 ug/L (ref 2–23)
CADMIUM: NOT DETECTED ug/L (ref 0.0–1.2)
LEAD, BLOOD: 1 ug/dL (ref 0–19)
Mercury: NOT DETECTED ug/L (ref 0.0–14.9)

## 2015-05-30 LAB — VITAMIN B6: Vitamin B6: 6.8 ug/L (ref 2.0–32.8)

## 2015-05-30 LAB — ANA W/REFLEX: Anti Nuclear Antibody(ANA): NEGATIVE

## 2015-05-30 LAB — C-REACTIVE PROTEIN: CRP: 5 mg/L — ABNORMAL HIGH (ref 0.0–4.9)

## 2015-05-30 LAB — VITAMIN B1: THIAMINE: 40.6 nmol/L — AB (ref 66.5–200.0)

## 2015-05-30 LAB — RHEUMATOID FACTOR: Rhuematoid fact SerPl-aCnc: 13.3 IU/mL (ref 0.0–13.9)

## 2015-05-30 LAB — HGB A1C W/O EAG: Hgb A1c MFr Bld: 4.9 % (ref 4.8–5.6)

## 2015-06-01 NOTE — Telephone Encounter (Signed)
I spoke to patient and she is aware and voiced understanding.

## 2015-06-08 ENCOUNTER — Other Ambulatory Visit: Payer: Self-pay | Admitting: *Deleted

## 2015-06-08 MED ORDER — NORTRIPTYLINE HCL 50 MG PO CAPS: 50.0000 mg | ORAL_CAPSULE | Freq: Every day | ORAL | Status: DC

## 2015-06-11 ENCOUNTER — Telehealth: Payer: Self-pay | Admitting: Neurology

## 2015-06-11 DIAGNOSIS — R292 Abnormal reflex: Secondary | ICD-10-CM

## 2015-06-11 DIAGNOSIS — G609 Hereditary and idiopathic neuropathy, unspecified: Secondary | ICD-10-CM

## 2015-06-11 DIAGNOSIS — R29898 Other symptoms and signs involving the musculoskeletal system: Secondary | ICD-10-CM

## 2015-06-11 NOTE — Telephone Encounter (Signed)
I would like to proceed with a L spine MRI with and wo contrast first and await results, then would like to pursue repeat testing with Dr. Allena Katz and second opinion with her. Please let pt know, MRI ordered.

## 2015-06-11 NOTE — Telephone Encounter (Signed)
LM to call back.

## 2015-06-11 NOTE — Telephone Encounter (Signed)
I spoke to patient and she is aware of results. She would like to proceed with referral and retesting. Laura Montoya states that she has only had a MRI of her lower back and she does not remember where she had that done. She will call back if she remembers.

## 2015-06-11 NOTE — Telephone Encounter (Signed)
I spoke to patient and she is aware.

## 2015-06-11 NOTE — Telephone Encounter (Signed)
Patient returned Dr. Teofilo Pod call regarding EMG results.

## 2015-06-11 NOTE — Telephone Encounter (Signed)
Please call patient back:  I called and left a message re: recent test results from EMG/NCV (patient's full name was on VM message):  I briefly explained that the nerve and muscle test showed evidence of nerve damage which may have happened a little while ago from which she may be recovering. It is not exactly clear, which is why would like to see if we can repeat this test in about a month and also seek an opinion with Dr. Allena Katz at [facility] neurology with EMG/NCV requested with Dr. Allena Katz.   If patient is agreeable to this approach, I can make a referral. Also please find out when she had her last lumbar spine MRI and/or neck MRI and if we could get the test results on those.   EMG/NCV report from 05/29/15:  Abnormal study demonstrate: 1. 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  2. Normal nerve conduction studies. 3. Given the clinical context considerations would include cervical and lumbar polyradiculopathies vs improving/recovering polyneuropathy.

## 2015-06-22 ENCOUNTER — Telehealth: Payer: Self-pay | Admitting: *Deleted

## 2015-06-22 ENCOUNTER — Ambulatory Visit
Admission: RE | Admit: 2015-06-22 | Discharge: 2015-06-22 | Disposition: A | Discharge status: Home / Self Care | Payer: Medicaid Other | Source: Ambulatory Visit | Attending: Neurology | Admitting: Neurology

## 2015-06-22 DIAGNOSIS — R29898 Other symptoms and signs involving the musculoskeletal system: Secondary | ICD-10-CM | POA: Diagnosis not present

## 2015-06-22 DIAGNOSIS — R292 Abnormal reflex: Secondary | ICD-10-CM

## 2015-06-22 DIAGNOSIS — G609 Hereditary and idiopathic neuropathy, unspecified: Secondary | ICD-10-CM

## 2015-06-22 MED ORDER — GADOBENATE DIMEGLUMINE 529 MG/ML IV SOLN
11.0000 mL | Freq: Once | INTRAVENOUS | Status: AC | PRN
  Administered 2015-06-22: 11 mL via INTRAVENOUS

## 2015-06-22 NOTE — Telephone Encounter (Signed)
Laura Montoya called and said that her doctor increased her tizanidine to 8 mg  Instead of the 4 mg she is taking from our office.  She is calling for a refill. I spoke with her to confirm who she means increased her medication.  She says that her PCP Laura Montoya increased it.  I explained that I cannot refill her medication on that basis and will need to confirm with his office that he did this.  Typically other medications are not increased without notifying the prescribing office. I will call and request office visit note.  I spoke with Shanda Bumps at [facility]  She confirmed Anna Genre PA ordered tizanidine 4 mg tid #90 no refills on 06/05/15 @ 4:42 pm.  The note she  read did not indicate that the patient let him know our office was prescribing.

## 2015-06-23 ENCOUNTER — Telehealth: Payer: Self-pay | Admitting: Neurology

## 2015-06-23 DIAGNOSIS — R29898 Other symptoms and signs involving the musculoskeletal system: Secondary | ICD-10-CM

## 2015-06-23 DIAGNOSIS — R292 Abnormal reflex: Secondary | ICD-10-CM

## 2015-06-23 DIAGNOSIS — G629 Polyneuropathy, unspecified: Secondary | ICD-10-CM

## 2015-06-23 NOTE — Progress Notes (Signed)
Quick Note:  MRI Lumbar spine was benign, please notify patient. I will request consultation with Dr. Allena Katz at [facility] neurology as discussed.  Huston Foley, MD, PhD [facility]  ______

## 2015-06-23 NOTE — Telephone Encounter (Signed)
Referral placed for patient.  

## 2015-06-23 NOTE — Telephone Encounter (Signed)
Dr. Allena Katz office has referral and they will process and call patient referral sent via epic.

## 2015-06-23 NOTE — Telephone Encounter (Signed)
Spoke with Laura Montoya, she state the PA told her to verbally  To increase her Tizanidine  To two tablets three times a day. This was not noted in chart. She placed a call to her PCP to obtain clarification, she's awaiting a call back. She will call our office if she hasn't received a call by 3:30.

## 2015-06-23 NOTE — Telephone Encounter (Signed)
Please initiate referral to Dr. Allena Katz in neuromuscular specialty at [facility].

## 2015-06-24 ENCOUNTER — Telehealth: Payer: Self-pay

## 2015-06-24 NOTE — Telephone Encounter (Signed)
I spoke to patient and she is aware.

## 2015-06-24 NOTE — Telephone Encounter (Signed)
-----   Message from Huston Foley, MD sent at 06/23/2015  2:45 PM EST ----- MRI Lumbar spine was benign, please notify patient. I will request consultation with Dr. Allena Katz at [facility] neurology as discussed.  Huston Foley, MD, PhD [facility])

## 2015-06-25 ENCOUNTER — Other Ambulatory Visit (INDEPENDENT_AMBULATORY_CARE_PROVIDER_SITE_OTHER): Payer: Medicaid Other

## 2015-06-25 ENCOUNTER — Encounter: Payer: Self-pay | Admitting: Neurology

## 2015-06-25 ENCOUNTER — Ambulatory Visit (INDEPENDENT_AMBULATORY_CARE_PROVIDER_SITE_OTHER): Payer: Medicaid Other | Admitting: Neurology

## 2015-06-25 VITALS — BP 118/80 | HR 108 | Ht 62.0 in | Wt 120.6 lb

## 2015-06-25 DIAGNOSIS — E5111 Dry beriberi: Secondary | ICD-10-CM | POA: Diagnosis not present

## 2015-06-25 DIAGNOSIS — G609 Hereditary and idiopathic neuropathy, unspecified: Secondary | ICD-10-CM

## 2015-06-25 DIAGNOSIS — E538 Deficiency of other specified B group vitamins: Secondary | ICD-10-CM | POA: Diagnosis not present

## 2015-06-25 DIAGNOSIS — R292 Abnormal reflex: Secondary | ICD-10-CM

## 2015-06-25 LAB — COMPREHENSIVE METABOLIC PANEL
ALT: 8 U/L (ref 0–35)
AST: 14 U/L (ref 0–37)
Albumin: 4 g/dL (ref 3.5–5.2)
Alkaline Phosphatase: 66 U/L (ref 39–117)
BUN: 5 mg/dL — ABNORMAL LOW (ref 6–23)
CO2: 27 meq/L (ref 19–32)
Calcium: 9.7 mg/dL (ref 8.4–10.5)
Chloride: 106 mEq/L (ref 96–112)
Creatinine, Ser: 0.65 mg/dL (ref 0.40–1.20)
GFR: 112.69 mL/min (ref >60.00)
GLUCOSE: 96 mg/dL (ref 70–99)
POTASSIUM: 3.4 meq/L — AB (ref 3.5–5.1)
Sodium: 139 mEq/L (ref 135–145)
Total Bilirubin: 0.2 mg/dL (ref 0.2–1.2)
Total Protein: 6.6 g/dL (ref 6.0–8.3)

## 2015-06-25 LAB — SEDIMENTATION RATE: Sed Rate: 25 mm/hr — ABNORMAL HIGH (ref 0–22)

## 2015-06-25 LAB — CBC
HEMATOCRIT: 39.3 % (ref 36.0–46.0)
HEMOGLOBIN: 13.3 g/dL (ref 12.0–15.0)
MCHC: 33.9 g/dL (ref 30.0–36.0)
MCV: 95 fl (ref 78.0–100.0)
Platelets: 405 10*3/uL — ABNORMAL HIGH (ref 150.0–400.0)
RBC: 4.13 Mil/uL (ref 3.87–5.11)
RDW: 13.8 % (ref 11.5–15.5)
WBC: 7.9 10*3/uL (ref 4.0–10.5)

## 2015-06-25 LAB — FOLATE: FOLATE: 4.6 ng/mL — AB (ref >5.9)

## 2015-06-25 LAB — MAGNESIUM: Magnesium: 1.9 mg/dL (ref 1.5–2.5)

## 2015-06-25 MED ORDER — CYANOCOBALAMIN 1000 MCG/ML IJ SOLN
1000.0000 ug | Freq: Once | INTRAMUSCULAR | Status: AC
  Administered 2015-06-25: 1000 ug via INTRAMUSCULAR

## 2015-06-25 NOTE — Progress Notes (Addendum)
[facility] Clinic Note - Initial Visit   Date: 06/25/2015  Laura Montoya MRN: [id number] DOB: February 04, 1986   Dear Dr. Rexene Alberts:  Thank you for your kind referral of Laura Montoya for consultation of neuropathy. Although her history is well known to you, please allow [name] to reiterate it for the purpose of our medical record. The patient was accompanied to the clinic by mother who also provides collateral information.     History of Present Illness: Laura Montoya is a 30 y.o. right-handed Caucasian female with schizophrenia, chronic pain syndrome (followed by PM&R), current tobacco user, and asthma presenting for second opinion of neuropathy.    Around the end of October 2016, she began experiencing gradual onset of electrical-like pain involving the lateral sole and ankles and especially worse left.  Symptoms progressively worsened over the next few months and by December, she was having the painful tingling involving her hands and she started having numbness involving her lower legs.  She endorses weakness of the hands and legs and has noticed difficulty opening jars and bottles. In November, she suffered a fall because of imbalance and has been using a walker since December.  Gentle massage, hot baths, tizanidine, and tylenol 3 provide relief. Prolonged walking exacerbates pain.  She was briefly on gabapentin which helped with pain, but it was stopped due to leg swelling. She does not recall any preceding events, except having a flu shot around the same time.  No recent travel or insect bites.    In late December, she developed vomiting and abdominal pain and was hospitalized for viral gastroenteritis. She was managed symptomatically with hydration and electrolyte repletion.  Since January, she has not noticed any progress worsening of symptoms and feels as if she's "hit a plateau".     She began seeing Dr. Rexene Alberts in early February for her ongoing  neurological symptoms and had electrodiagnostic testing, serology testing, and imaging of her lumbar spine which was reviewed.  NCS showed normal bilateral median and ulnar sensory and motor responses.  Left sural as well as peroneal and tibial motor responses was normal.  Late responses were also normal.  Needle electrode examination showed active denervation in bilateral medial gastrocnemius muscles and the right FCR.  Paraspinal muscles appeared normal.  Other muscles involving the S1 and C6/C7-myotomes were not studied.  Labs showed low vitamin B12, vitmain B1, and vitamin D levels. She is not vegan or vegetarian, but only eats one meal a day. She was recommended to start OTC supplements, but due to cost, has not started this.    No history of heavy alcohol use.  She occasionally has a wine cooler, about once per month.    No family history of neuromuscular disease, including neuropathy.    Out-side paper records, electronic medical record, and images have been reviewed where available and summarized as:  Labs 05/27/2015:  CK 42, RF neg, CRP 5.0*, vitamin B6 6.8, ANA neg, vitamin D 27.4*, HbA1c 4.9, heavy metal screen, thiamine 40.6*  NCS/EMG 05/29/2015: Abnormal study demonstrate: 1. 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  2. Normal nerve conduction studies. 3. Given the clinical context considerations would include cervical and lumbar polyradiculopathies vs improving/recovering polyneuropathy.  MRI lumbar spine wwo contrast 06/23/2015: This MRI of the lumbar spine with and without contrast shows the following: 1. Spinal cord appears normal. 2. Mild disc bulging at L4-L5 that does not lead to any  nerve root impingement. 3. There is a normal enhancement pattern.  Lab Results  Component Value Date   TSH 1.149 05/01/2015   Lab Results  Component Value Date   VITAMINB12 234 11/15/2013   Labs  03/03/2015:  Vitamin B12 223   Past Medical History  Diagnosis Date  . Asthma   . Scoliosis   . Schizophrenia ([place])   . Mood disorder ([place])   . Anxiety   . Dysmenorrhea   . Headache   . Dizziness   . Thrush     Past Surgical History  Procedure Laterality Date  . Breast surgery    . Mouth surgery       Medications:  Outpatient Encounter Prescriptions as of 06/25/2015  Medication Sig Note  . acetaminophen-codeine (TYLENOL #3) 300-30 MG tablet Take 1 tablet by mouth 2 (two) times daily as needed for moderate pain.   . ARIPiprazole (ABILIFY) 5 MG tablet Take 5 mg by mouth daily. 06/25/2015: Received from: External Pharmacy  . Cholecalciferol (VITAMIN D PO) Take 1 tablet by mouth daily. 1000-2000units/day   . clonazePAM (KLONOPIN) 0.5 MG tablet TAKE 1 TABLET TWICE A DAY AS NEEDED FOR ANXIETY 05/15/2015: Received from: External Pharmacy  . diphenhydrAMINE (BENADRYL) 25 mg capsule Take 50 mg by mouth every 6 (six) hours as needed. For ithcing   . meclizine (ANTIVERT) 32 MG tablet Take 1 tablet (32 mg total) by mouth 3 (three) times daily. For 3 days scheduled then as needed thereafter (Patient taking differently: Take 32 mg by mouth 3 (three) times daily as needed for dizziness. )   . nortriptyline (PAMELOR) 50 MG capsule Take 1 capsule (50 mg total) by mouth at bedtime.   Marland Kitchen omeprazole (PRILOSEC) 40 MG capsule Take 1 capsule (40 mg total) by mouth daily.   . ondansetron (ZOFRAN ODT) 4 MG disintegrating tablet 49m ODT q4 hours prn nausea/vomit   . tiZANidine (ZANAFLEX) 4 MG tablet Take 1 tablet (4 mg total) by mouth every 6 (six) hours as needed for muscle spasms.    No facility-administered encounter medications on file as of 06/25/2015.     Allergies:  Allergies  Allergen Reactions  . Lavender Oil Rash    Family History: Family History  Problem Relation Age of Onset  . Thyroid disease Mother   . Mental illness Father     Social History: Social History  Substance Use Topics    . Smoking status: Current Every Day Smoker    Types: Cigarettes  . Smokeless tobacco: None  . Alcohol Use: No   Social History   Social History Narrative   Has not had caffeine for 1 month 05/27/15.     Review of Systems:  CONSTITUTIONAL: No fevers, chills, night sweats, or weight loss.   EYES: No visual changes or eye pain +dry mouth ENT: No hearing changes.  No history of nose bleeds.   RESPIRATORY: No cough, wheezing and shortness of breath.   CARDIOVASCULAR: Negative for chest pain, and palpitations.   GI: Negative for abdominal discomfort, blood in stools or black stools.  No recent change in bowel habits.   GU:  No history of incontinence.   MUSCLOSKELETAL: +history of joint pain or swelling.  No myalgias.   SKIN: Negative for lesions, rash, and itching.   HEMATOLOGY/ONCOLOGY: Negative for prolonged bleeding, bruising easily, and swollen nodes.  No history of cancer.   ENDOCRINE: Negative for cold or heat intolerance, polydipsia or goiter.   PSYCH:  +depression or anxiety symptoms.   NEURO:  As Above.   Vital Signs:  BP 118/80 mmHg  Pulse 108  Ht _0  (1.575 m)  Wt 120 lb 9 oz (54.687 kg)  BMI 22.05 kg/m2  SpO2 98%   General Medical Exam:   General:  Appears much older than stated age.   Eyes/ENT: see cranial nerve examination.  Edentulous.  Neck: No masses appreciated.  Full range of motion without tenderness.  No carotid bruits. Respiratory:  Clear to auscultation, good air entry bilaterally.   Cardiac:  Regular rate and rhythm, no murmur.   Extremities:  No deformities, edema, or skin discoloration.  Skin:  No rashes or lesions.  Neurological Exam: MENTAL STATUS including orientation to time, place, person, recent and remote memory, attention span and concentration, language, and fund of knowledge is normal.  Speech is not dysarthric.  CRANIAL NERVES: II:  No visual field defects.  Unremarkable fundi.   III-IV-VI: Pupils equal round and reactive to light.   Normal conjugate, extra-ocular eye movements in all directions of gaze.  No nystagmus.  No ptosis.   V:  Normal facial sensation.     VII:  Normal facial symmetry and movements.   VIII:  Normal hearing and vestibular function.   IX-X:  Normal palatal movement.   XI:  Normal shoulder shrug and head rotation.   XII:  Normal tongue strength and range of motion, no deviation or fasciculation.  MOTOR:  No atrophy, fasciculations or abnormal movements.  No pronator drift.  Tone is normal.    Right Upper Extremity:    Left Upper Extremity:    Deltoid  5/5   Deltoid  5/5   Biceps  5/5   Biceps  5/5   Triceps  5/5   Triceps  5/5   Wrist extensors  5/5   Wrist extensors  5/5   Wrist flexors  5/5   Wrist flexors  5/5   Finger extensors  5-/5   Finger extensors  5-/5   Finger flexors  5-/5   Finger flexors  5-/5   Dorsal interossei  5-/5   Dorsal interossei  5-/5   Abductor pollicis  5-/5   Abductor pollicis  5-/5   Tone (Ashworth scale)  0  Tone (Ashworth scale)  0   Right Lower Extremity:    Left Lower Extremity:    Hip flexors  5/5   Hip flexors  5/5   Hip extensors  5/5   Hip extensors  5/5   Knee flexors  5/5   Knee flexors  4+/5   Knee extensors  5/5   Knee extensors  5/5   Dorsiflexors  5-/5   Dorsiflexors  5-/5   Plantarflexors  5-/5   Plantarflexors  5-/5   Toe extensors  5/5   Toe extensors  5-/5   Toe flexors  4+/5   Toe flexors  4/5   Tone (Ashworth scale)  0  Tone (Ashworth scale)  0   MSRs:  Right                                                                 Left brachioradialis 2+  brachioradialis 2+  biceps 2+  biceps 2+  triceps 2+  triceps 2+  patellar 0  Patellar 0  ankle jerk 0  ankle jerk 0  Hoffman no  Hoffman no  plantar response mute  plantar response mute   SENSORY:  Normal and symmetric perception of light touch, pinprick, and proprioception. Vibration is minimally reduced at the ankles bilaterally.  Romberg's sign shows mild sway.   COORDINATION/GAIT:  Normal finger-to- nose-finger.  Intact rapid alternating movements bilaterally.  Unable to rise from a chair without using arms.  Unassisted gait is wide-based, slow, cautious, and unsteady. She is able to stand on toes.  Unable to stand in tandem or on heels.   IMPRESSION: Ms. Palladino is a 30 year-old female referred for evaluation of subacute and progressive symptoms of distal painful paresthesias, gait imbalance, and weakness.  She had extensive testing under the care of Dr. Rexene Alberts including NCS/EMG, contrasted MRI lumbar spine, and serology testing which was notable for active motor axon loss involving bilateral gastrocnemius muscles and right flexor carpi radialis muscle. Nerve conduction studies were normal.  Imaging of the lumbar spine does not show nerve impingement.  Of the labs checked, her CRP is elevated and vitamin B1 and D are both low.  She also has very low-normal vitamin B12 levels.  She has not started supplementation for vitamin B1 as of yet due to financial constraints.  To alleviate the financial cost of the supplement, I strongly advised her to stop smoking, which will also benefit her long-term health.  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  Although vitamin deficiencies can certainly present in this manner, acute motor and sensory axonal neuropathy, a GBS variant, also needs to be considered.  If this were the case, I would expect there to be more amplitude loss on her NCS.    To further investigate symptoms, repeat electrodiagnostic testing will be performed with more extensive needle electrode examination. Laboratory testing for inflammatory markers, nutrition deficiency, and paraproteinemias will be checked.      PLAN/RECOMMENDATIONS:  1.  Check CBC, CMP, Mg, ESR, SSA/B, folate, SPEP/UPEP with IFE, copper, ceruloplasmin 2.  NCS/EMG of the left side 3.  Start Vitamin B12 1011mg IM injection daily x  7 days, weekly x 4 weeks, then monthly thereafter x 1 year. First injection administered today.  4.  Start vitamin B1 (thiamine) 1025m 5.  Encouraged stop smoking 6.  Consider CSF testing going forward 7.  Fall precautions discussed  Return to clinic in 4 weeks   The duration of this appointment visit was 50 minutes of face-to-face time with the patient.  Greater than 50% of this time was spent in counseling, explanation of diagnosis, planning of further management, and coordination of care.   Thank you for allowing me to participate in patient's care.  If I can answer any additional questions, I would be pleased to do so.    Sincerely,    Emnet Monk K. PaPosey ProntoDO

## 2015-06-25 NOTE — Patient Instructions (Addendum)
1.  Start Vitamin B12 IM injection daily x 7 days, weekly x 4 weeks, then monthly thereafter x 1 year. 2.  Start vitamin B1 (thiamine)  daily 3.  Check blood work 4.  NCS/EMG of the left side 5.  Encouraged to stop smoking  Return to clinic 4 weeks  [facility]  Preventing Falls in the Home   Falls are common, often dreaded events in the lives of older people. Aside from the obvious injuries and even death that may result, falls can cause wide-ranging consequences including loss of independence, mental decline, decreased activity, and mobility. Younger people are also at risk of falling, especially those with chronic illnesses and fatigue.  Ways to reduce the risk for falling:  * Examine diet and medications. Warm foods and alcohol dilate blood vessels, which can lead to dizziness when standing. Sleep aids, antidepressants, and pain medications can also increase the likelihood of a fall.  * Get a vison exam. Poor vision, cataracts, and glaucoma increase the chances of falling.  * Check foot gear. Shoes should fit snugly and have a sturdy, nonskid sole and broad, low heel.  * Participate in a physician-approved exercise program to build and maintain muscle strength and improve balance and coordination.  * Increase vitamin D intake. Vitamin D improves muscle strength and increases the amount of calcium the body is able to absorb and deposit in bones.  How to prevent falls from common hazards:  * Floors - Remove all loose wires, cords, and throw rugs. Minimize clutter. Make sure rugs are anchored and smooth. Keep furniture in its usual place.  * Chairs - Use chairs with straight backs, armrests, and firm seats. Add firm cushions to existing pieces to add height.  * Bathroom - Install grab bars and non-skid tape in the tub or shower. Use a bathtub transfer bench or a shower chair with a back support. Use an elevated toilet seat and/or safety rails to assist standing from a low  surface. Do not use towel racks or bathroom tissue holders to help you stand.  * Lighting - Make sure halls, stairways, and entrances are well-lit. Install a night light in your bathroom or hallway. Make sure there is a light switch at the top and bottom of the staircase. Turn lights on if you get up in the middle of the night. Make sure lamps or light switches are within reach of the bed if you have to get up during the night.  * Kitchen - Install non-skid rubber mats near the sink and stove. Clean spills immediately. Store frequently used utensils, pots, and pans between waist and eye level. This helps prevent reaching and bending. Sit when getting things out of the lower cupboards.  * Living room / Bedrooms - Place furniture with wide spaces in between, giving enough room to move around. Establish a route through the living room that gives you something to hold onto as you walk.  * Stairs - Make sure treads, rails, and rugs are secure. Install a rail on both sides of the stairs. If stairs are a threat, it might be helpful to arrange most of your activities on the lower level to reduce the number of times you must climb the stairs.  * Entrances and doorways - Install metal handles on the walls adjacent to the doorknobs of all doors to make it more secure as you travel through the doorway.  Tips for maintaining balance:  * Keep at least one hand free at  all times Try using a backpack or fanny pack to hold things rather than carrying them in your hands. Never carry objects in both hands when walking as this interferes with keeping your balance.  * Attempt to swing both arms from front to back while walking. This might require a conscious effort if Parkinson's disease has diminished your movement. It will, however, help you to maintain balance and posture, and reduce fatigue.  * Consciously lift your feet off the ground when walking. Shuffling and dragging of the feet is a common culprit in losing your  balance.  * When trying to navigate turns, use a "U" technique of facing forward and making a wide turn, rather than pivoting sharply.  * Try to stand with your feet shoulder-length apart. When your feet are close together for any length of time, you increase your risk of losing your balance and falling.  * Do one thing at a time. Do not try to walk and accomplish another task, such as reading or looking around. The decrease in your automatic reflexes complicates motor function, so the less distraction, the better.  * Do not wear rubber or gripping soled shoes, they might "catch" on the floor and cause tripping.  * Move slowly when changing positions. Use deliberate, concentrated movements and, if needed, use a grab bar or walking aid. Count fifteen (15) seconds after standing to begin walking.  * If balance is a continuous problem, you might want to consider a walking aid such as a cane, walking stick, or walker. Once you have mastered walking with help, you may be ready to try it again on your own.  This information is provided by [facility] and is not intended to replace the medical advice of your physician or other health care providers. Please consult your physician or other health care providers for advice regarding your specific medical condition.

## 2015-06-26 ENCOUNTER — Ambulatory Visit (INDEPENDENT_AMBULATORY_CARE_PROVIDER_SITE_OTHER): Payer: Medicaid Other | Admitting: *Deleted

## 2015-06-26 DIAGNOSIS — E538 Deficiency of other specified B group vitamins: Secondary | ICD-10-CM

## 2015-06-26 MED ORDER — CYANOCOBALAMIN 1000 MCG/ML IJ SOLN
1000.0000 ug | Freq: Once | INTRAMUSCULAR | Status: AC
  Administered 2015-06-26: 1000 ug via INTRAMUSCULAR

## 2015-06-26 NOTE — Progress Notes (Signed)
Patient in for B12 injection. 

## 2015-06-29 ENCOUNTER — Ambulatory Visit (INDEPENDENT_AMBULATORY_CARE_PROVIDER_SITE_OTHER): Payer: Medicaid Other | Admitting: *Deleted

## 2015-06-29 DIAGNOSIS — E538 Deficiency of other specified B group vitamins: Secondary | ICD-10-CM

## 2015-06-29 LAB — PROTEIN ELECTROPHORESIS, SERUM
ALBUMIN ELP: 3.7 g/dL — AB (ref 3.8–4.8)
ALPHA-1-GLOBULIN: 0.4 g/dL — AB (ref 0.2–0.3)
ALPHA-2-GLOBULIN: 0.8 g/dL (ref 0.5–0.9)
Beta 2: 0.4 g/dL (ref 0.2–0.5)
Beta Globulin: 0.4 g/dL (ref 0.4–0.6)
Gamma Globulin: 0.7 g/dL — ABNORMAL LOW (ref 0.8–1.7)
TOTAL PROTEIN, SERUM ELECTROPHOR: 6.4 g/dL (ref 6.1–8.1)

## 2015-06-29 LAB — PROTEIN ELECTROPHORESIS,RANDOM URN
CREATININE, URINE: 65 mg/dL (ref 20–320)
Protein Creatinine Ratio: 62 mg/g creat (ref 21–161)
TOTAL PROTEIN, URINE: 4 mg/dL — AB (ref 5–24)

## 2015-06-29 LAB — SJOGREN'S SYNDROME ANTIBODS(SSA + SSB)
SSA (Ro) (ENA) Antibody, IgG: <1
SSB (La) (ENA) Antibody, IgG: <1

## 2015-06-29 LAB — IMMUNOFIXATION INTE

## 2015-06-29 LAB — CERULOPLASMIN: Ceruloplasmin: 28 mg/dL (ref 18–53)

## 2015-06-29 MED ORDER — CYANOCOBALAMIN 1000 MCG/ML IJ SOLN
1000.0000 ug | Freq: Once | INTRAMUSCULAR | Status: AC
  Administered 2015-06-29: 1000 ug via INTRAMUSCULAR

## 2015-06-29 NOTE — Progress Notes (Signed)
Patient in for B12 injection. 

## 2015-06-30 ENCOUNTER — Ambulatory Visit (INDEPENDENT_AMBULATORY_CARE_PROVIDER_SITE_OTHER): Payer: Medicaid Other | Admitting: *Deleted

## 2015-06-30 DIAGNOSIS — E538 Deficiency of other specified B group vitamins: Secondary | ICD-10-CM

## 2015-06-30 MED ORDER — CYANOCOBALAMIN 1000 MCG/ML IJ SOLN
1000.0000 ug | Freq: Once | INTRAMUSCULAR | Status: AC
  Administered 2015-06-30: 1000 ug via INTRAMUSCULAR

## 2015-06-30 NOTE — Progress Notes (Signed)
Patient in for B12 injection. 

## 2015-07-01 ENCOUNTER — Ambulatory Visit (INDEPENDENT_AMBULATORY_CARE_PROVIDER_SITE_OTHER): Payer: Medicaid Other | Admitting: *Deleted

## 2015-07-01 DIAGNOSIS — E538 Deficiency of other specified B group vitamins: Secondary | ICD-10-CM | POA: Diagnosis not present

## 2015-07-01 MED ORDER — CYANOCOBALAMIN 1000 MCG/ML IJ SOLN
1000.0000 ug | Freq: Once | INTRAMUSCULAR | Status: AC
  Administered 2015-07-01: 1000 ug via INTRAMUSCULAR

## 2015-07-01 NOTE — Progress Notes (Signed)
Patient in for B12 injection. 

## 2015-07-02 ENCOUNTER — Ambulatory Visit (INDEPENDENT_AMBULATORY_CARE_PROVIDER_SITE_OTHER): Payer: Medicaid Other | Admitting: *Deleted

## 2015-07-02 DIAGNOSIS — E538 Deficiency of other specified B group vitamins: Secondary | ICD-10-CM

## 2015-07-02 MED ORDER — CYANOCOBALAMIN 1000 MCG/ML IJ SOLN
1000.0000 ug | Freq: Once | INTRAMUSCULAR | Status: AC
  Administered 2015-07-02: 1000 ug via INTRAMUSCULAR

## 2015-07-02 NOTE — Progress Notes (Signed)
Patient in for B12 injection. 

## 2015-07-03 ENCOUNTER — Ambulatory Visit (INDEPENDENT_AMBULATORY_CARE_PROVIDER_SITE_OTHER): Payer: Medicaid Other | Admitting: *Deleted

## 2015-07-03 DIAGNOSIS — E538 Deficiency of other specified B group vitamins: Secondary | ICD-10-CM

## 2015-07-03 MED ORDER — CYANOCOBALAMIN 1000 MCG/ML IJ SOLN
1000.0000 ug | Freq: Once | INTRAMUSCULAR | Status: AC
  Administered 2015-07-03: 1000 ug via INTRAMUSCULAR

## 2015-07-03 NOTE — Progress Notes (Signed)
Patient in for B12 injection. 

## 2015-07-07 ENCOUNTER — Ambulatory Visit: Payer: Medicaid Other

## 2015-07-07 ENCOUNTER — Ambulatory Visit (INDEPENDENT_AMBULATORY_CARE_PROVIDER_SITE_OTHER): Payer: Medicaid Other | Admitting: Neurology

## 2015-07-07 ENCOUNTER — Ambulatory Visit (INDEPENDENT_AMBULATORY_CARE_PROVIDER_SITE_OTHER): Payer: Medicaid Other | Admitting: *Deleted

## 2015-07-07 DIAGNOSIS — R292 Abnormal reflex: Secondary | ICD-10-CM | POA: Diagnosis not present

## 2015-07-07 DIAGNOSIS — E538 Deficiency of other specified B group vitamins: Secondary | ICD-10-CM

## 2015-07-07 DIAGNOSIS — G609 Hereditary and idiopathic neuropathy, unspecified: Secondary | ICD-10-CM

## 2015-07-07 DIAGNOSIS — E5111 Dry beriberi: Secondary | ICD-10-CM

## 2015-07-07 MED ORDER — CYANOCOBALAMIN 1000 MCG/ML IJ SOLN
1000.0000 ug | Freq: Once | INTRAMUSCULAR | Status: AC
  Administered 2015-07-07: 1000 ug via INTRAMUSCULAR

## 2015-07-07 NOTE — Procedures (Signed)
[facility]  [address] [place], [address]  [place], [place] [address] Tel: [phone] Fax:  [phone] Test Date:  07/07/2015  Patient: Laura Montoya DOB: 06/07/1985 Physician: Nita Sickle, DO  Sex: Female Height:  Ref Phys: Nita Sickle, DO  ID#: [id number] Temp: 34.9C Technician: Judie Petit. Dean   Patient Complaints: This is a 30 year old female referred for evaluation of generalized paresthesias, weakness, and gait instability.  NCV & EMG Findings: Extensive electrodiagnostic testing of the left upper and lower extremities shows: 1. All sensory responses show abnormalities with reduced amplitude in bilateral median, bilateral ulnar, left radial, and left sural nerves.  The left ulnar sensory nerve also shows prolonged latency. Right radial and left superficial peroneal sensory responses are absent. 2. All motor responses are within normal limits including the left median, ulnar, peroneal, and tibial nerves.  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 3. Left median and ulnar F wave studies are within normal limits. Left tibial H reflex studies within normal limits. 4. Motor unit configuration is within normal limits, however, there is a generalized pattern of variable recruitment pattern involving the muscles below the knees and distal hand and forearm muscles. Active denervation is seen in the medial gastrocnemius and pronator teres muscles. There is no denervation involving the cervical or lumbar sacral paraspinal muscles.  Impression: 1. 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 Overall, these findings are severe in degree electrically. 2. 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   ___________________________ Nita Sickle, DO    Nerve Conduction Studies Anti Sensory Summary Table   Site NR Peak (ms) Norm Peak (ms) P-T Amp (V) Norm P-T Amp  Left Median Anti Sensory (2nd Digit)  34.9C  Wrist    3.4 <3.4 3.3 >20  Right Median Anti Sensory (2nd Digit)  34.9C  Wrist    3.4 <3.4 2.9 >20  Left Radial Anti Sensory (Base 1st Digit)  34.9C  Wrist    2.2 <2.7 3.1 >18  Site 2    2.4  2.7   Site 3    2.4  3.1   Right Radial Anti Sensory (Base 1st Digit)  34.9C  Wrist NR  <2.7  >18  Left Sup Peroneal Anti Sensory (Ant Lat Mall)  34.9C  12 cm NR  <4.5  >5  Left Sural Anti Sensory (Lat Mall)  34.9C  Calf    3.7 <4.5 4.8 >5  Left Ulnar Anti Sensory (5th Digit)  34.9C  Wrist    3.3 <3.1 2.6 >12  Right Ulnar Anti Sensory (5th Digit)  34.9C  Wrist    3.0 <3.1 4.0 >12   Motor Summary Table   Site NR Onset (ms) Norm Onset (ms) O-P Amp (mV) Norm O-P Amp Site1 Site2 Delta-0 (ms) Dist (cm) Vel (m/s) Norm Vel (m/s)  Left Median Motor (Abd Poll Brev)  34.9C  Wrist    3.5 <3.9 6.0 >6 Elbow Wrist 4.7 25.0 53 >50  Elbow    8.2  6.5  Ulnar-wrist crossover Elbow 3.6 0.0    Ulnar-wrist crossover    4.6  7.2         Left Peroneal Motor (Ext Dig Brev)  34.9C  Ankle    3.6 <5.5 4.0 >3 B  Fib Ankle 6.9 31.0 45 >40  B Fib    10.5  4.1  Poplt B Fib 2.0 10.0 50 >40  Poplt    12.5  3.8         Left Tibial Motor (Abd Hall Brev)  34.9C  Ankle    3.3 <6.0 12.5 >8 Knee Ankle 8.4 38.0 45 >40  Knee    11.7  6.8         Left Ulnar Motor (Abd Dig Minimi)  34.9C  Wrist    2.7 <3.1 8.4 >7 B Elbow Wrist 2.9 18.0 62 >50  B Elbow    5.6  8.1  A Elbow B Elbow 1.7 10.0 59 >50  A Elbow    7.3  7.6          F Wave Studies   NR F-Lat (ms) Lat Norm (ms) L-R F-Lat (ms)  Left Median (Mrkrs) (Abd Poll Brev)  34.9C     25.89 <33   Left Tibial (Mrkrs) (Abd Hallucis)  34.9C     49.97 <55   Right Ulnar (Mrkrs) (Abd Dig Min)  34.9C     24.22 <33    H  Reflex Studies   NR H-Lat (ms) Lat Norm (ms) L-R H-Lat (ms)  Left Tibial (Gastroc)  34.9C     30.48 <35    EMG   Side Muscle Ins Act Fibs Psw Fasc Number Recrt Dur Dur. Amp Amp. Poly Poly. Comment  Left AntTibialis Nml Nml Nml Nml 1- Mod-R Nml Nml Nml Nml Nml Nml N/A  Left Gastroc Nml 1+ Nml Nml 2- Mod-R Nml Nml Nml Nml Nml Nml N/A  Left Flex Dig Long Nml Nml Nml Nml 3- Mod-R Nml Nml Nml Nml Nml Nml N/A  Left RectFemoris Nml Nml Nml Nml Nml Nml Nml Nml Nml Nml Nml Nml N/A  Left GluteusMed Nml Nml Nml Nml Nml Nml Nml Nml Nml Nml Nml Nml N/A  Left GluteusMax Nml Nml Nml Nml Nml Nml Nml Nml Nml Nml Nml Nml N/A  Left Lumbo Parasp Low Nml Nml Nml Nml Nml Nml Nml Nml Nml Nml Nml Nml N/A  Left BicepsFemS Nml Nml Nml Nml 2- Mod-R Nml Nml Nml Nml Nml Nml N/A  Left 1stDorInt Nml Nml Nml Nml 1- Mod-R Nml Nml Nml Nml Nml Nml N/A  Left Abd Poll Brev Nml Nml Nml Nml 2- Mod-R Nml Nml Nml Nml Nml Nml N/A  Left Ext Indicis Nml Nml Nml Nml 2- Mod-R Nml Nml Nml Nml Nml Nml N/A  Left PronatorTeres Nml 1+ Nml Nml 1- Mod-R Nml Nml Nml Nml Nml Nml N/A  Left Biceps Nml Nml Nml Nml Nml Nml Nml Nml Nml Nml Nml Nml N/A  Left Triceps Nml Nml Nml Nml Nml Nml Nml Nml Nml Nml Nml Nml N/A  Left Deltoid Nml Nml Nml Nml Nml Nml Nml Nml Nml Nml Nml Nml N/A  Left Cervical Parasp Low Nml Nml Nml Nml Nml Nml Nml Nml Nml Nml Nml Nml N/A      Waveforms:

## 2015-07-07 NOTE — Progress Notes (Signed)
Patient in for B12 injection. 

## 2015-07-13 ENCOUNTER — Encounter: Payer: Self-pay | Admitting: Neurology

## 2015-07-13 ENCOUNTER — Ambulatory Visit (INDEPENDENT_AMBULATORY_CARE_PROVIDER_SITE_OTHER): Payer: Medicaid Other | Admitting: Neurology

## 2015-07-13 VITALS — BP 104/70 | HR 115 | Ht 62.0 in | Wt 117.1 lb

## 2015-07-13 DIAGNOSIS — G63 Polyneuropathy in diseases classified elsewhere: Secondary | ICD-10-CM | POA: Diagnosis not present

## 2015-07-13 DIAGNOSIS — E5111 Dry beriberi: Secondary | ICD-10-CM | POA: Diagnosis not present

## 2015-07-13 DIAGNOSIS — G609 Hereditary and idiopathic neuropathy, unspecified: Secondary | ICD-10-CM | POA: Diagnosis not present

## 2015-07-13 DIAGNOSIS — E538 Deficiency of other specified B group vitamins: Secondary | ICD-10-CM

## 2015-07-13 MED ORDER — CYANOCOBALAMIN 1000 MCG/ML IJ SOLN
1000.0000 ug | Freq: Once | INTRAMUSCULAR | Status: AC
  Administered 2015-07-13: 1000 ug via INTRAMUSCULAR

## 2015-07-13 NOTE — Progress Notes (Addendum)
Follow-up Visit   Date: 07/13/2015    Laura Montoya MRN: [id number] DOB: 1985-05-09   Interim History: Laura Montoya is a 30 y.o. 30 y.o. right-handed Caucasian female with schizophrenia, chronic pain syndrome (followed by PM&R), current tobacco user, and asthma returning to the clinic for follow-up of neuropathy.  The patient was accompanied to the clinic by father and daughter who also provides collateral information.    History of present illness: Around the end of October 2016, she began experiencing gradual onset of electrical-like pain involving the lateral sole and ankles and especially worse left. Symptoms progressively worsened over the next few months and by December, she was having the painful tingling involving her hands and she started having numbness involving her lower legs. She endorses weakness of the hands and legs and has noticed difficulty opening jars and bottles. In November, she suffered a fall because of imbalance and has been using a walker since December. Gentle massage, hot baths, tizanidine, and tylenol 3 provide relief. Prolonged walking exacerbates pain. She was briefly on gabapentin which helped with pain, but it was stopped due to leg swelling. She does not recall any preceding events, except having a flu shot around the same time. No recent travel or insect bites.   In late December, she developed vomiting and abdominal pain and was hospitalized for viral gastroenteritis. She was managed symptomatically with hydration and electrolyte repletion. Since January, she has not noticed any progress worsening of symptoms and feels as if she's "hit a plateau".   She began seeing Dr. Rexene Alberts in early February for her ongoing neurological symptoms and had electrodiagnostic testing, serology testing, and imaging of her lumbar spine which was reviewed. NCS showed normal bilateral median and ulnar sensory and motor responses. Left sural as well as peroneal  and tibial motor responses was normal. Late responses were also normal. Needle electrode examination showed active denervation in bilateral medial gastrocnemius muscles and the right FCR. Paraspinal muscles appeared normal. Other muscles involving the S1 and C6/C7-myotomes were not studied. Labs showed low vitamin B12, vitmain B1, and vitamin D levels. She is not vegan or vegetarian, but only eats one meal a day. She was recommended to start OTC supplements, but due to cost, has not started this.   No history of heavy alcohol use. She occasionally has a wine cooler, about once per month.   No family history of neuromuscular disease, including neuropathy.   UPDATE 07/13/2015:  Patient is here to discuss her labs and electrodiagnostic testing.   Lab testing shows nutrient deficiency in vitamin B12, vitamin B1, and folate.  She endorses trying to eat 2 meals a day, but since the birth of her last child, has always eaten only one meal daily.  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  Demyelinating changes were not present, but she did have active denervation as previously noted. She feels that symptoms have plateud and there has been no improvement or worsening.  She walks with a walker and had one fall last week.  No new complaints.  Medications:  Current Outpatient Prescriptions on File Prior to Visit  Medication Sig Dispense Refill  . acetaminophen-codeine (TYLENOL #3) 300-30 MG tablet Take 1 tablet by mouth 2 (two) times daily as needed for moderate pain. 60 tablet 1  . ARIPiprazole (ABILIFY) 5 MG tablet Take 5 mg by mouth daily.  0  . Cholecalciferol (VITAMIN D PO) Take 1 tablet by mouth daily. 1000-2000units/day    .  clonazePAM (KLONOPIN) 0.5 MG tablet TAKE 1 TABLET TWICE A DAY AS NEEDED FOR ANXIETY  0  . diphenhydrAMINE (BENADRYL) 25 mg capsule Take 50 mg by mouth every 6 (six) hours as needed. For ithcing    . meclizine (ANTIVERT) 32 MG  tablet Take 1 tablet (32 mg total) by mouth 3 (three) times daily. For 3 days scheduled then as needed thereafter (Patient taking differently: Take 32 mg by mouth 3 (three) times daily as needed for dizziness. ) 60 tablet 1  . nortriptyline (PAMELOR) 50 MG capsule Take 1 capsule (50 mg total) by mouth at bedtime. 30 capsule 2  . omeprazole (PRILOSEC) 40 MG capsule Take 1 capsule (40 mg total) by mouth daily. 60 capsule 1  . ondansetron (ZOFRAN ODT) 4 MG disintegrating tablet 50m ODT q4 hours prn nausea/vomit 20 tablet 0  . tiZANidine (ZANAFLEX) 4 MG tablet Take 1 tablet (4 mg total) by mouth every 6 (six) hours as needed for muscle spasms. 75 tablet 3   No current facility-administered medications on file prior to visit.    Allergies:  Allergies  Allergen Reactions  . Lavender Oil Rash    Review of Systems:  CONSTITUTIONAL: No fevers, chills, night sweats, or weight loss.  EYES: No visual changes or eye pain ENT: No hearing changes.  No history of nose bleeds.   RESPIRATORY: No cough, wheezing and shortness of breath.   CARDIOVASCULAR: Negative for chest pain, and palpitations.   GI: Negative for abdominal discomfort, blood in stools or black stools.  No recent change in bowel habits.   GU:  No history of incontinence.   MUSCLOSKELETAL: No history of joint pain or swelling.  No myalgias.   SKIN: Negative for lesions, rash, and itching.   ENDOCRINE: Negative for cold or heat intolerance, polydipsia or goiter.   PSYCH:  + depression or anxiety symptoms.   NEURO: As Above.   Vital Signs:  BP 104/70 mmHg  Pulse 115  Ht _0  (1.575 m)  Wt 117 lb 2 oz (53.128 kg)  BMI 21.42 kg/m2  SpO2 97%  Neurological Exam: MENTAL STATUS including orientation to time, place, person, recent and remote memory, attention span and concentration, language, and fund of knowledge is normal.  Speech is not dysarthric.  CRANIAL NERVES: Pupils equal round and reactive to light.  Normal conjugate,  extra-ocular eye movements in all directions of gaze.  No ptosis. Normal facial sensation.  Face is symmetric. Palate elevates symmetrically.  Tongue is midline.  MOTOR:  No atrophy, fasciculations or abnormal movements.  No pronator drift.  Tone is normal.    Right Upper Extremity:    Left Upper Extremity:    Deltoid  5/5   Deltoid  5/5   Biceps  5/5   Biceps  5/5   Triceps  5/5   Triceps  5/5   Wrist extensors  5/5   Wrist extensors  5/5   Wrist flexors  5/5   Wrist flexors  5/5   Finger extensors  5-/5   Finger extensors  5-/5   Finger flexors  5-/5   Finger flexors  5-/5   Dorsal interossei  5-/5   Dorsal interossei  5-/5   Abductor pollicis  5-/5   Abductor pollicis  5-/5   Tone (Ashworth scale)  0  Tone (Ashworth scale)  0   Right Lower Extremity:    Left Lower Extremity:    Hip flexors  5/5   Hip flexors  5/5   Hip extensors  5/5   Hip extensors  5/5   Knee flexors  5/5   Knee flexors  4+/5   Knee extensors  5/5   Knee extensors  5/5   Dorsiflexors  5-/5   Dorsiflexors  5-/5   Plantarflexors  5-/5   Plantarflexors  5-/5   Toe extensors  5/5   Toe extensors  5-/5   Toe flexors  4+/5   Toe flexors  4/5   Tone (Ashworth scale)  0  Tone (Ashworth scale)  0    MSRs:  Reflexes are 2+/4 in the upper extremities and absent in the lower extremities.  SENSORY:  Vibration is absent distal to ankles bilaterally.  Temperature and pin prick is reduced distal to mid-calf bilaterally.  Sensation is preserved in the upper extremities.    COORDINATION/GAIT:  Wide-based, unsteady gait.  Rhomberg sign is positive.   Data: Labs 06/25/2015:  copper 131, IFE no M protein, ESR 25, folate 4.6*, SSA/B neg   NCS/EMG of the left upper and lower extremities 07/07/2015:  The electrophysiologic findings are most consistent with a subacute and generalized sensory polyneuropathy, axonal loss in type, affecting the left side. Overall, these findings are severe in degree electrically. Incidentally, there is a  left Martin-Gruber anastomosis, a normal variant.  Labs 05/27/2015:  CK 42, RF neg, CRP 5.0*, vitamin B6 6.8, ANA neg, vitamin D 27.4*, HbA1c 4.9, heavy metal screen, thiamine 40.6* Labs 03/03/2015:  Vitamin B12 223  NCS/EMG 05/29/2015: Abnormal study demonstrate: 1. 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   2. Normal nerve conduction studies. 3. Given the clinical context considerations would include cervical and lumbar polyradiculopathies vs improving/recovering polyneuropathy.  MRI lumbar spine wwo contrast 06/23/2015: This MRI of the lumbar spine with and without contrast shows the following: 1.   Spinal cord appears normal. 2.   Mild disc bulging at L4-L5 that does not lead to any nerve root impingement. 3.   There is a normal enhancement pattern.  IMPRESSION: Ms. Guilford is a 30 year-old female returning for evaluation of subacute and progressive symptoms of distal painful paresthesias, gait imbalance, and weakness.  Exam shows glove-stocking pattern of neuropathy and sensory ataxia.  Repeat electrodiagnostic testing showed subacute generalized sensory polyneuropathy, axon loss in type.  Serology testing has been notable for low vitamin B1, vitamin B12, and folate levels which are being supplemented.  There is concern for poor PO intake as she only eats one meal daily.  She denies any history of eating disorder.  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     PLAN/RECOMMENDATIONS:  1.  CSF testing to include:  CSF cell count and diff, protein, glucose, routine cultures, IgG index, oligoclonal bands, myelin basic protein, flow cytology, ACE. 2.  Check vitamin B12 1071mg monthly (administered today), vitamin B1 1161WRdaily, and folic acid 122m daily.  Check levels at next visit 3.  Recommended to add 2-3 cans of ensure to diet daily  Return to clinic in 1 month  The duration of this appointment visit was 30 minutes of face-to-face time with the patient.  Greater than 50% of this time was spent in counseling, explanation of diagnosis, planning of further management, and coordination of care.   Thank you for allowing me to participate in patient's care.  If I can answer any additional questions, I would be pleased to do so.    Sincerely,    Donika K. Posey Pronto, DO

## 2015-07-13 NOTE — Patient Instructions (Signed)
1. We will arrange for spinal tap and call you with the results 2. Continue vitamin B1, B12 and folic acid supplements 3. Start adding 2-3 cans of ensure to your diet  Return to clinic in 1 month

## 2015-07-14 ENCOUNTER — Ambulatory Visit: Payer: Medicaid Other

## 2015-07-14 ENCOUNTER — Other Ambulatory Visit: Payer: Self-pay | Admitting: *Deleted

## 2015-07-14 DIAGNOSIS — R292 Abnormal reflex: Secondary | ICD-10-CM

## 2015-07-14 DIAGNOSIS — R29898 Other symptoms and signs involving the musculoskeletal system: Secondary | ICD-10-CM

## 2015-07-14 DIAGNOSIS — G609 Hereditary and idiopathic neuropathy, unspecified: Secondary | ICD-10-CM

## 2015-07-14 DIAGNOSIS — G63 Polyneuropathy in diseases classified elsewhere: Secondary | ICD-10-CM

## 2015-07-14 NOTE — Progress Notes (Signed)
Order placed

## 2015-07-17 ENCOUNTER — Ambulatory Visit
Admission: RE | Admit: 2015-07-17 | Discharge: 2015-07-17 | Disposition: A | Discharge status: Home / Self Care | Payer: Medicaid Other | Source: Ambulatory Visit | Attending: Neurology | Admitting: Neurology

## 2015-07-17 ENCOUNTER — Other Ambulatory Visit: Payer: Self-pay | Admitting: Neurology

## 2015-07-17 ENCOUNTER — Other Ambulatory Visit (HOSPITAL_COMMUNITY)
Admission: RE | Admit: 2015-07-17 | Discharge: 2015-07-17 | Disposition: A | Discharge status: Home / Self Care | Payer: Medicaid Other | Source: Ambulatory Visit | Attending: Neurology | Admitting: Neurology

## 2015-07-17 DIAGNOSIS — R292 Abnormal reflex: Secondary | ICD-10-CM | POA: Diagnosis not present

## 2015-07-17 DIAGNOSIS — R29898 Other symptoms and signs involving the musculoskeletal system: Secondary | ICD-10-CM | POA: Diagnosis not present

## 2015-07-17 DIAGNOSIS — G609 Hereditary and idiopathic neuropathy, unspecified: Secondary | ICD-10-CM | POA: Diagnosis not present

## 2015-07-17 DIAGNOSIS — G63 Polyneuropathy in diseases classified elsewhere: Secondary | ICD-10-CM | POA: Insufficient documentation

## 2015-07-17 LAB — CSF CELL COUNT WITH DIFFERENTIAL
RBC COUNT CSF: 0 uL
TUBE #: 4
WBC, CSF: 0 cu mm (ref 0–5)

## 2015-07-17 NOTE — Discharge Instructions (Signed)

## 2015-07-17 NOTE — Progress Notes (Signed)
One SST tube of blood drawn from right AC space for LP labs; site unremarkable. 

## 2015-07-18 LAB — PROTEIN, CSF: Total Protein, CSF: 29 mg/dL (ref 15–45)

## 2015-07-18 LAB — GLUCOSE, CSF: Glucose, CSF: 56 mg/dL (ref 43–76)

## 2015-07-20 ENCOUNTER — Ambulatory Visit: Payer: Medicaid Other

## 2015-07-20 LAB — CSF CULTURE W GRAM STAIN: Organism ID, Bacteria: NO GROWTH

## 2015-07-20 LAB — CSF CULTURE: GRAM STAIN: NONE SEEN

## 2015-07-21 ENCOUNTER — Ambulatory Visit (INDEPENDENT_AMBULATORY_CARE_PROVIDER_SITE_OTHER): Payer: Medicaid Other | Admitting: *Deleted

## 2015-07-21 DIAGNOSIS — E538 Deficiency of other specified B group vitamins: Secondary | ICD-10-CM

## 2015-07-21 LAB — ANGIOTENSIN CONVERTING ENZYME, CSF: ACE, CSF: 14 U/L (ref <=15)

## 2015-07-21 MED ORDER — CYANOCOBALAMIN 1000 MCG/ML IJ SOLN
1000.0000 ug | Freq: Once | INTRAMUSCULAR | Status: AC
  Administered 2015-07-21: 1000 ug via INTRAMUSCULAR

## 2015-07-21 NOTE — Progress Notes (Signed)
Patient in for B12 injection. 

## 2015-07-22 LAB — CNS IGG SYNTHESIS RATE, CSF+BLOOD
Albumin, CSF: 18 mg/dL (ref 8.0–42.0)
Albumin, Serum(Neph): 4.1 g/dL (ref 3.7–5.1)
IGG INDEX, CSF: 0.47 (ref <0.66)
IgG, CSF: 1.4 mg/dL (ref 0.8–7.7)
IgG, Serum: 676 mg/dL — ABNORMAL LOW (ref 694–1618)
MS CNS IGG SYNTHESIS RATE: -2.2 mg/(24.h) (ref <=3.3)

## 2015-07-24 ENCOUNTER — Ambulatory Visit: Payer: Medicaid Other | Admitting: Neurology

## 2015-07-24 LAB — OLIGOCLONAL BANDS, CSF + SERM

## 2015-07-24 LAB — MYELIN BASIC PROTEIN, CSF

## 2015-07-28 ENCOUNTER — Ambulatory Visit (INDEPENDENT_AMBULATORY_CARE_PROVIDER_SITE_OTHER): Payer: Medicaid Other | Admitting: *Deleted

## 2015-07-28 DIAGNOSIS — E538 Deficiency of other specified B group vitamins: Secondary | ICD-10-CM

## 2015-07-28 MED ORDER — CYANOCOBALAMIN 1000 MCG/ML IJ SOLN
1000.0000 ug | Freq: Once | INTRAMUSCULAR | Status: AC
  Administered 2015-07-28: 1000 ug via INTRAMUSCULAR

## 2015-07-28 NOTE — Progress Notes (Signed)
Patient in for B12 injection. 

## 2015-08-03 ENCOUNTER — Encounter: Payer: Medicaid Other | Attending: Physical Medicine & Rehabilitation | Admitting: Physical Medicine & Rehabilitation

## 2015-08-03 ENCOUNTER — Encounter: Payer: Self-pay | Admitting: Physical Medicine & Rehabilitation

## 2015-08-03 VITALS — BP 103/77 | HR 107

## 2015-08-03 DIAGNOSIS — R42 Dizziness and giddiness: Secondary | ICD-10-CM | POA: Insufficient documentation

## 2015-08-03 DIAGNOSIS — H8113 Benign paroxysmal vertigo, bilateral: Secondary | ICD-10-CM | POA: Insufficient documentation

## 2015-08-03 DIAGNOSIS — Z79899 Other long term (current) drug therapy: Secondary | ICD-10-CM

## 2015-08-03 DIAGNOSIS — F419 Anxiety disorder, unspecified: Secondary | ICD-10-CM | POA: Insufficient documentation

## 2015-08-03 DIAGNOSIS — F17200 Nicotine dependence, unspecified, uncomplicated: Secondary | ICD-10-CM | POA: Insufficient documentation

## 2015-08-03 DIAGNOSIS — G8929 Other chronic pain: Secondary | ICD-10-CM | POA: Diagnosis not present

## 2015-08-03 DIAGNOSIS — M7918 Myalgia, other site: Secondary | ICD-10-CM

## 2015-08-03 DIAGNOSIS — M4120 Other idiopathic scoliosis, site unspecified: Secondary | ICD-10-CM

## 2015-08-03 DIAGNOSIS — M4716 Other spondylosis with myelopathy, lumbar region: Secondary | ICD-10-CM

## 2015-08-03 DIAGNOSIS — Z9889 Other specified postprocedural states: Secondary | ICD-10-CM | POA: Diagnosis not present

## 2015-08-03 DIAGNOSIS — J45909 Unspecified asthma, uncomplicated: Secondary | ICD-10-CM | POA: Diagnosis not present

## 2015-08-03 DIAGNOSIS — M129 Arthropathy, unspecified: Secondary | ICD-10-CM | POA: Insufficient documentation

## 2015-08-03 DIAGNOSIS — F200 Paranoid schizophrenia: Secondary | ICD-10-CM | POA: Insufficient documentation

## 2015-08-03 DIAGNOSIS — F329 Major depressive disorder, single episode, unspecified: Secondary | ICD-10-CM | POA: Insufficient documentation

## 2015-08-03 DIAGNOSIS — M545 Low back pain: Secondary | ICD-10-CM | POA: Insufficient documentation

## 2015-08-03 DIAGNOSIS — M797 Fibromyalgia: Secondary | ICD-10-CM

## 2015-08-03 DIAGNOSIS — G894 Chronic pain syndrome: Secondary | ICD-10-CM | POA: Diagnosis not present

## 2015-08-03 DIAGNOSIS — M419 Scoliosis, unspecified: Secondary | ICD-10-CM | POA: Insufficient documentation

## 2015-08-03 DIAGNOSIS — Z5181 Encounter for therapeutic drug level monitoring: Secondary | ICD-10-CM

## 2015-08-03 MED ORDER — ACETAMINOPHEN-CODEINE #3 300-30 MG PO TABS: 1.0000 | ORAL_TABLET | Freq: Two times a day (BID) | ORAL | Status: DC | PRN

## 2015-08-03 MED ORDER — TIZANIDINE HCL 4 MG PO TABS: 4.0000 mg | ORAL_TABLET | Freq: Four times a day (QID) | ORAL | Status: DC | PRN

## 2015-08-03 NOTE — Patient Instructions (Signed)
  PLEASE CALL ME WITH ANY PROBLEMS OR QUESTIONS (#[phone]).      

## 2015-08-03 NOTE — Progress Notes (Signed)
Subjective:    Patient ID: [name], female    DOB: 07/10/1985, 30 y.o.   MRN: [id number]  HPI   Laura KaufmannMelissa is here in follow up of her chornic pain. She was diagnosed with a vitamin (B) deficiency/nutritional  neuropathy based on labs and NCS. It appears that CSF was normal. She is supplementing her diet with vitamins and trying to eat a more balanced diet. Protein levels were normal upon the labs I reviewed.   The tylenol #3 seems to be helping with her generalized back pain. The nortriptyline helps with her feet and generalized sleep too. She reports that her mood is as good as it's ever been. The Abilify has helped her suicidal ideations.     Pain Inventory Average Pain 7 Pain Right Now 8 My pain is constant, burning, dull and tingling  In the last 24 hours, has pain interfered with the following? General activity 8 Relation with others 6 Enjoyment of life 7 What TIME of day is your pain at its worst? evening Sleep (in general) Fair  Pain is worse with: walking, bending, standing and some activites Pain improves with: rest, heat/ice, pacing activities, medication and injections Relief from Meds: 6  Mobility use a cane ability to climb steps?  yes do you drive?  no  Function not employed: date last employed 10/2013 I need assistance with the following:  dressing, bathing, meal prep, household duties and shopping  Neuro/Psych numbness tingling spasms dizziness anxiety  Prior Studies Any changes since last visit?  no  Physicians involved in your care Any changes since last visit?  no   Family History  Problem Relation Age of Onset  . Thyroid disease Mother   . Mental illness Father   . Asthma Mother   . Hyperlipidemia Father   . Healthy Brother   . Healthy Daughter    Social History   Social History  . Marital Status: Single    Spouse Name: N/A  . Number of Children: 1  . Years of Education: GED   Occupational History  . N/A    Social  History Main Topics  . Smoking status: Current Every Day Smoker -- 0.25 packs/day for 16 years    Types: Cigarettes  . Smokeless tobacco: Never Used  . Alcohol Use: 0.0 oz/week    0 Standard drinks or equivalent per week     Comment: One wine cooler per month  . Drug Use: No  . Sexual Activity: Not Asked   Other Topics Concern  . None   Social History Narrative   Has not had caffeine for 1 month 05/27/15.  Lives with parents and daughter in a one story home.  Does not work.  Has a disability hearing coming up on July 16, 2015.     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 Has not worked since 2015.     Education: GED   Past Surgical History  Procedure Laterality Date  . Breast surgery    . Mouth surgery     Past Medical History  Diagnosis Date  . Asthma   . Scoliosis   . Schizophrenia (HCC)   . Mood disorder (HCC)   . Anxiety   . Dysmenorrhea   . Headache   . Dizziness   . Thrush    BP 103/77 mmHg  Pulse 107  SpO2 98%  LMP 03/09/2015 (Approximate)  Opioid Risk Score:   Fall Risk Score:  `1  Depression screen PHQ 2/9  Depression screen PHQ  2/9 08/03/2015 11/12/2014  Decreased Interest 1 1  Down, Depressed, Hopeless 0 0  PHQ - 2 Score 1 1  Altered sleeping 1 -  Tired, decreased energy 1 -  Change in appetite 2 -  Feeling bad or failure about yourself  1 -  Trouble concentrating 2 -  Moving slowly or fidgety/restless 1 -  Suicidal thoughts 0 -  PHQ-9 Score 9 -     Review of Systems  Constitutional: Positive for appetite change and unexpected weight change.  Gastrointestinal: Positive for diarrhea.  All other systems reviewed and are negative.      Objective:   Physical Exam  General: Alert and oriented x 3, No apparent distress  HEENT: Head is normocephalic, atraumatic, PERRLA, EOMI, sclera anicteric, oral mucosa pink and moist, edentulous, ext ear canals clear,  Neck: Supple without JVD or lymphadenopathy  Heart: Reg rate and rhythm. No murmurs rubs or gallops    Chest: CTA bilaterally without wheezes, rales, or rhonchi; no distress  Abdomen: Soft, non-tender, non-distended, bowel sounds positive.  Extremities: No clubbing, cyanosis, or edema. Pulses are 2+  Skin: Clean and intact without signs of breakdown. Numerous tattoos  Neuro:   Pt is cognitively appropriate with normal insight, memory, and awareness. Cranial nerves 2-12 are intact. Sensory exam is diminshed to LT in both feet/ankles.  Reflexes are 2+ in all 4's. Fine motor coordination is intact. No tremors. Motor function is grossly 5/5. Wide based gait.  Musculoskeletal: improved posture, no palpable trigger points. Left rhomboids and paraspinals along the scapular border less taut and painful to touch. near normal cervical ROM with some pain in right cervical bending and rotation. She was able to touch the floor with minimal effort.  Psych: Pt's affect is appropriate. Pt is cooperative    Assessment & Plan:   1. Chronic low back pain, SIJ pain, mild facet arthropathy  2. Myofascial back pain particularly in the left shoulder girdle. ?FMS  3. Osteopenia?  4. Hx of paranoid schizophrenia which appears under control.  5. Benign paroxysmal positional vertigo likely triggered by recent sinus congestion  6. Distal sensory polyneuropathy   Plan:  1. Continue naproxen in the evening with food .  2. Ongoing stretching, stress mgt, heat therapy, maintain psych follow up  3. Discussed importance of nutrition as it pertains to her nerve health and overall health. Continue per neuro recs.  4. Zanaflex to  q8 prn----#75 ---RF today  5. Ongoing HEP. Use cane for balance.  Needs to take her time and no limits also 6. Needs refill of Tylenol #3 today, #60. May use tylenol prn on its own.  7. 20 minutes of face to face patient care time were spent during this visit. All questions were encouraged and answered. Follow up with NP in 2 months.

## 2015-08-07 ENCOUNTER — Telehealth: Payer: Self-pay | Admitting: *Deleted

## 2015-08-07 LAB — TOXASSURE SELECT,+ANTIDEPR,UR: PDF: 0

## 2015-08-07 NOTE — Progress Notes (Signed)
Urine drug screen for this encounter is inconsistent. High levels of THC (marijuana) is present.

## 2015-08-07 NOTE — Telephone Encounter (Signed)
Urine drug screen for encounter 08/03/2015 is inconsistent. High level of THC (marijuana) is present.

## 2015-08-12 ENCOUNTER — Encounter: Payer: Self-pay | Admitting: Neurology

## 2015-08-12 ENCOUNTER — Ambulatory Visit (INDEPENDENT_AMBULATORY_CARE_PROVIDER_SITE_OTHER): Payer: Medicaid Other | Admitting: Neurology

## 2015-08-12 ENCOUNTER — Other Ambulatory Visit (INDEPENDENT_AMBULATORY_CARE_PROVIDER_SITE_OTHER): Payer: Medicaid Other

## 2015-08-12 VITALS — BP 104/68 | HR 95 | Ht 62.0 in | Wt 119.4 lb

## 2015-08-12 DIAGNOSIS — E538 Deficiency of other specified B group vitamins: Secondary | ICD-10-CM

## 2015-08-12 DIAGNOSIS — E5111 Dry beriberi: Secondary | ICD-10-CM

## 2015-08-12 DIAGNOSIS — G63 Polyneuropathy in diseases classified elsewhere: Secondary | ICD-10-CM

## 2015-08-12 LAB — VITAMIN B12

## 2015-08-12 LAB — FOLATE: FOLATE: 8.8 ng/mL (ref >5.9)

## 2015-08-12 MED ORDER — CYANOCOBALAMIN 1000 MCG/ML IJ SOLN
1000.0000 ug | Freq: Once | INTRAMUSCULAR | Status: AC
  Administered 2015-08-12: 1000 ug via INTRAMUSCULAR

## 2015-08-12 NOTE — Progress Notes (Signed)
Follow-up Visit   Date: 08/12/2015    Jacayla Nordell MRN: [id number] DOB: 09-07-1985   Interim History: Eriana Suliman is a 30 y.o. 30 y.o. 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  The patient was accompanied to the clinic by self.  History of present illness: Around the end of October 2016, she began experiencing gradual onset of electrical-like pain involving the lateral sole and ankles and especially worse left. Symptoms progressively worsened over the next few months and by December, she was having the painful tingling involving her hands and she started having numbness involving her lower legs. She endorses weakness of the hands and legs and has noticed difficulty opening jars and bottles. In November, she suffered a fall because of imbalance and has been using a walker since December. Prolonged walking exacerbates pain. She was briefly on gabapentin which helped with pain, but it was stopped due to leg swelling. She does not recall any preceding events, except having a flu shot around the same time. No recent travel or insect bites. In late December, she developed vomiting and abdominal pain and was hospitalized for viral gastroenteritis. She was managed symptomatically with hydration and electrolyte repletion. Since January, she has not noticed any progress worsening of symptoms and feels as if she's "hit a plateau".   She began seeing Dr. Rexene Alberts in early February for her ongoing neurological symptoms and had electrodiagnostic testing, serology testing, and imaging of her lumbar spine which was reviewed. NCS showed normal bilateral median and ulnar sensory and motor responses. Left sural as well as peroneal and tibial motor responses was normal. Late responses were also normal. Needle electrode examination showed  active denervation in bilateral medial gastrocnemius muscles and the right FCR. Paraspinal muscles appeared normal. Other muscles involving the S1 and C6/C7-myotomes were not studied. Labs showed low vitamin B12, vitmain B1, and vitamin D levels. She is not vegan or vegetarian, but only eats one meal a day. She was recommended to start OTC supplements, but due to cost, did not start this.   No history of heavy alcohol use. She occasionally has a wine cooler, about once per month.   No family history of neuromuscular disease, including neuropathy.   UPDATE 07/13/2015:  Patient is here to discuss her labs and electrodiagnostic testing.   Lab testing shows nutrient deficiency in vitamin B12, vitamin B1, and folate.  She endorses trying to eat 2 meals a day, but since the birth of her last child, has always eaten only one meal daily.  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  Demyelinating changes were not present, and she did have active denervation as previously noted. She feels that symptoms have plateaued and there has been no improvement or worsening.  She walks with a walker and had one fall last week.   UPDATE 08/12/2015:  Since her last visit, she underwent CSF testing which returned normal.  She has made an active effort to increase PO intake and has been compliant with taking her supplements (folic acid, vitamin B1, and vitamin B12) and has noticed a marked improvement in her strength and overall energy.  She is eating 3-4 oz of protein and 2-3 cups of fruits and vegetables.  She is still only eating two meals per day and snacks throughout the day.  Her balance has improved and now walks  with cane.  She had not had any interval falls.  Although her hand strength has improved, she still has difficulty with opening bottles and jars.  Her numbness/tingling is always worse in the evening and she has started seeing Pain Management.  She takes tylenol #3  at night which helps her rest at night.  She also takes nortriptyline 854m and tizanidine 416m  No new complaints.    Medications:  Current Outpatient Prescriptions on File Prior to Visit  Medication Sig Dispense Refill  . acetaminophen-codeine (TYLENOL #3) 300-30 MG tablet Take 1 tablet by mouth 2 (two) times daily as needed for moderate pain. 60 tablet 1  . ARIPiprazole (ABILIFY) 5 MG tablet Take 5 mg by mouth daily.  0  . Cholecalciferol (VITAMIN D PO) Take 1 tablet by mouth daily. 1000-2000units/day    . diphenhydrAMINE (BENADRYL) 25 mg capsule Take 50 mg by mouth every 6 (six) hours as needed. For ithcing    . meclizine (ANTIVERT) 32 MG tablet Take 1 tablet (32 mg total) by mouth 3 (three) times daily. For 3 days scheduled then as needed thereafter (Patient taking differently: Take 32 mg by mouth 3 (three) times daily as needed for dizziness. ) 60 tablet 1  . nortriptyline (PAMELOR) 50 MG capsule Take 1 capsule (50 mg total) by mouth at bedtime. 30 capsule 2  . omeprazole (PRILOSEC) 40 MG capsule Take 1 capsule (40 mg total) by mouth daily. 60 capsule 1  . ondansetron (ZOFRAN ODT) 4 MG disintegrating tablet [id number] q4 hours prn nausea/vomit 20 tablet 0  . tiZANidine (ZANAFLEX) 4 MG tablet Take 1 tablet (4 mg total) by mouth every 6 (six) hours as needed for muscle spasms. 75 tablet 3   No current facility-administered medications on file prior to visit.    Allergies:  Allergies  Allergen Reactions  . Lavender Oil Rash    Review of Systems:  CONSTITUTIONAL: No fevers, chills, night sweats, or weight loss.  EYES: No visual changes or eye pain ENT: No hearing changes.  No history of nose bleeds.   RESPIRATORY: No cough, wheezing and shortness of breath.   CARDIOVASCULAR: Negative for chest pain, and palpitations.   GI: Negative for abdominal discomfort, blood in stools or black stools.  No recent change in bowel habits.   GU:  No history of incontinence.   MUSCLOSKELETAL: No  history of joint pain or swelling.  No myalgias.   SKIN: Negative for lesions, rash, and itching.   ENDOCRINE: Negative for cold or heat intolerance, polydipsia or goiter.   PSYCH:  + depression or anxiety symptoms.   NEURO: As Above.   Vital Signs:  BP 104/68 mmHg  Pulse 95  Ht 5' 2" (1.575 m)  Wt 119 lb 6 oz (54.148 kg)  BMI 21.83 kg/m2  SpO2 97%  LMP 03/09/2015 (Approximate)  Neurological Exam: MENTAL STATUS including orientation to time, place, person, recent and remote memory, attention span and concentration, language, and fund of knowledge is normal.  Speech is not dysarthric.  CRANIAL NERVES: Pupils equal round and reactive to light.  Normal conjugate, extra-ocular eye movements in all directions of gaze.  No ptosis. Normal facial sensation.  Face is symmetric. Palate elevates symmetrically.  Tongue is midline.  MOTOR:  No atrophy, fasciculations or abnormal movements.  No pronator drift.  Tone is normal.    Right Upper Extremity:    Left Upper Extremity:    Deltoid  5/5   Deltoid  5/5   Biceps  5/5  Biceps  5/5   Triceps  5/5   Triceps  5/5   Wrist extensors  5/5   Wrist extensors  5/5   Wrist flexors  5/5   Wrist flexors  5/5   Finger extensors *improved 5/5   Finger extensors *improved 5/5   Finger flexors *improved 5/5   Finger flexors *improved 5/5   Dorsal interossei  5-/5   Dorsal interossei  5-/5   Abductor pollicis  5-/5   Abductor pollicis  5-/5   Tone (Ashworth scale)  0  Tone (Ashworth scale)  0   Right Lower Extremity:    Left Lower Extremity:    Hip flexors  5/5   Hip flexors  5/5   Hip extensors  5/5   Hip extensors  5/5   Knee flexors  5/5   Knee flexors *improved 5/5   Knee extensors  5/5   Knee extensors  5/5   Dorsiflexors  5/5   Dorsiflexors  5/5   Plantarflexors  5/5   Plantarflexors  5/5   Toe extensors  5/5   Toe extensors  5/5   Toe flexors *improved 5-/5   Toe flexors *improved 5-/5   Tone (Ashworth scale)  0  Tone (Ashworth scale)  0    MSRs:  Reflexes are 2+/4 in the upper extremities and absent in the lower extremities.  SENSORY:  Vibration is 60% at ankles and great toe which is markedly improved from previously when it was absent.  Temperature and pin prick remains reduced distal to mid-calf bilaterally.  Sensation is preserved in the upper extremities.    COORDINATION/GAIT:  Wide-based, stable gait.  She is able to stand on heels and toes (improved).  Rhomberg sign is mildly positive.   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 Overall, these findings are severe in degree electrically. Incidentally, there is a left Martin-Gruber anastomosis, a normal variant.  Labs 05/27/2015:  CK 42, RF neg, CRP 5.0*, vitamin B6 6.8, ANA neg, vitamin D 27.4*, HbA1c 4.9, heavy metal screen, thiamine 40.6* Labs 03/03/2015:  Vitamin B12 223  NCS/EMG 05/29/2015: Abnormal study demonstrate: 1. 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   2. Normal nerve conduction studies. 3. Given the clinical context considerations would include cervical and lumbar polyradiculopathies vs improving/recovering polyneuropathy.  MRI lumbar spine wwo contrast 06/23/2015: This MRI of the lumbar spine with and without contrast shows the following: 1.   Spinal cord appears normal. 2.   Mild disc bulging at L4-L5 that does not lead to any nerve root impingement. 3.   There is a normal enhancement pattern.  IMPRESSION: Ms. Palmatier is a 30 year-old female returning for evaluation of subacute and progressive distal  painful paresthesias, gait imbalance, and weakness due to nutrient deficiency and malnutrition.  Electrodiagnostic testing showed subacute generalized sensory polyneuropathy, axon loss in type.  Serology testing has been notable for low vitamin B1, vitamin B12, and folate levels which are being supplemented.  CSF testing was normal.    Over the past month, there has been improvement in her motor  strength and now only has mild distal intrinsic hand and toe weakness.  On today's exam, she was able to perceive vibration distally which was previously absent and is able to stand on heels and toes which is new.  She remains arreflexic in the legs and tandem gait is unsteady.  She no longer uses a walker and is able to manage with a cane or independently at times.  I encouraged her to continue to maintain a healthy well-balanced diet and to supplement meals with ensure and she will continue to see improvement.    PLAN/RECOMMENDATIONS:  1.  Check folate, vitamin B1, vitamin B12 2.  Check vitamin B12 1012mg monthly (administered today), vitamin B1 1062BJdaily, and folic acid 167mdaily.   3.  Continue to add 2-3 cans of ensure to diet daily and encouraged her to eat three nutritious meals daily  Return to clinic in 3 month  The duration of this appointment visit was 30 minutes of face-to-face time with the patient.  Greater than 50% of this time was spent in counseling, explanation of diagnosis, planning of further management, and coordination of care.   Thank you for allowing me to participate in patient's care.  If I can answer any additional questions, I would be pleased to do so.    Sincerely,     K. PaPosey ProntoDO

## 2015-08-12 NOTE — Patient Instructions (Signed)
1.  Check blood work 2.  Continue your supplements as you are taking them 3.  Continue to add 2-3 cans of ensure daily 4.  Eat three nutritious meals daily  Return to clinic in 3 months

## 2015-08-16 LAB — VITAMIN B1: Vitamin B1 (Thiamine): <7 nmol/L — ABNORMAL LOW (ref 8–30)

## 2015-08-24 ENCOUNTER — Telehealth: Payer: Self-pay

## 2015-08-24 NOTE — Telephone Encounter (Signed)
I called patient to see if she still needs to come to appt tomorrow. She is now seeing Dr. Allena KatzPatel and just saw him a couple of days ago. Patient did not feel like she needed to come in tomorrow. But was advised to call us if she needs anything from us.

## 2015-08-25 ENCOUNTER — Ambulatory Visit: Payer: Medicaid Other | Admitting: Neurology

## 2015-09-14 ENCOUNTER — Encounter (HOSPITAL_COMMUNITY): Payer: Self-pay | Admitting: *Deleted

## 2015-09-14 ENCOUNTER — Observation Stay (HOSPITAL_COMMUNITY)
Admission: EM | Admit: 2015-09-14 | Discharge: 2015-09-15 | Disposition: A | Discharge status: Home / Self Care | Payer: Medicaid Other | Attending: Internal Medicine | Admitting: Internal Medicine

## 2015-09-14 ENCOUNTER — Inpatient Hospital Stay (HOSPITAL_COMMUNITY): Payer: Medicaid Other

## 2015-09-14 DIAGNOSIS — E518 Other manifestations of thiamine deficiency: Secondary | ICD-10-CM

## 2015-09-14 DIAGNOSIS — A084 Viral intestinal infection, unspecified: Principal | ICD-10-CM | POA: Diagnosis present

## 2015-09-14 DIAGNOSIS — Z6823 Body mass index (BMI) 23.0-23.9, adult: Secondary | ICD-10-CM | POA: Insufficient documentation

## 2015-09-14 DIAGNOSIS — R197 Diarrhea, unspecified: Secondary | ICD-10-CM | POA: Diagnosis not present

## 2015-09-14 DIAGNOSIS — G629 Polyneuropathy, unspecified: Secondary | ICD-10-CM

## 2015-09-14 DIAGNOSIS — R9431 Abnormal electrocardiogram [ECG] [EKG]: Secondary | ICD-10-CM

## 2015-09-14 DIAGNOSIS — F17210 Nicotine dependence, cigarettes, uncomplicated: Secondary | ICD-10-CM | POA: Insufficient documentation

## 2015-09-14 DIAGNOSIS — Z23 Encounter for immunization: Secondary | ICD-10-CM | POA: Insufficient documentation

## 2015-09-14 DIAGNOSIS — E876 Hypokalemia: Secondary | ICD-10-CM

## 2015-09-14 DIAGNOSIS — G8929 Other chronic pain: Secondary | ICD-10-CM | POA: Insufficient documentation

## 2015-09-14 DIAGNOSIS — R1110 Vomiting, unspecified: Secondary | ICD-10-CM

## 2015-09-14 DIAGNOSIS — R112 Nausea with vomiting, unspecified: Secondary | ICD-10-CM | POA: Diagnosis not present

## 2015-09-14 DIAGNOSIS — D72829 Elevated white blood cell count, unspecified: Secondary | ICD-10-CM | POA: Diagnosis not present

## 2015-09-14 DIAGNOSIS — E860 Dehydration: Secondary | ICD-10-CM

## 2015-09-14 DIAGNOSIS — E5111 Dry beriberi: Secondary | ICD-10-CM | POA: Diagnosis present

## 2015-09-14 DIAGNOSIS — E46 Unspecified protein-calorie malnutrition: Secondary | ICD-10-CM

## 2015-09-14 DIAGNOSIS — M419 Scoliosis, unspecified: Secondary | ICD-10-CM | POA: Diagnosis not present

## 2015-09-14 DIAGNOSIS — F209 Schizophrenia, unspecified: Secondary | ICD-10-CM | POA: Insufficient documentation

## 2015-09-14 DIAGNOSIS — E8889 Other specified metabolic disorders: Secondary | ICD-10-CM | POA: Insufficient documentation

## 2015-09-14 DIAGNOSIS — E519 Thiamine deficiency, unspecified: Secondary | ICD-10-CM | POA: Insufficient documentation

## 2015-09-14 DIAGNOSIS — F1290 Cannabis use, unspecified, uncomplicated: Secondary | ICD-10-CM | POA: Diagnosis not present

## 2015-09-14 DIAGNOSIS — E538 Deficiency of other specified B group vitamins: Secondary | ICD-10-CM | POA: Diagnosis not present

## 2015-09-14 DIAGNOSIS — K529 Noninfective gastroenteritis and colitis, unspecified: Secondary | ICD-10-CM | POA: Diagnosis present

## 2015-09-14 DIAGNOSIS — I4581 Long QT syndrome: Secondary | ICD-10-CM

## 2015-09-14 DIAGNOSIS — F17200 Nicotine dependence, unspecified, uncomplicated: Secondary | ICD-10-CM

## 2015-09-14 LAB — BASIC METABOLIC PANEL
Anion gap: 4 — ABNORMAL LOW (ref 5–15)
BUN: <5 mg/dL — ABNORMAL LOW (ref 6–20)
CO2: 23 mmol/L (ref 22–32)
CREATININE: 0.6 mg/dL (ref 0.44–1.00)
Calcium: 7.8 mg/dL — ABNORMAL LOW (ref 8.9–10.3)
Chloride: 110 mmol/L (ref 101–111)
GFR calc Af Amer: >60 mL/min (ref >60)
GFR calc non Af Amer: >60 mL/min (ref >60)
GLUCOSE: 93 mg/dL (ref 65–99)
POTASSIUM: 3.5 mmol/L (ref 3.5–5.1)
Sodium: 137 mmol/L (ref 135–145)

## 2015-09-14 LAB — TROPONIN I: Troponin I: <0.03 ng/mL (ref <0.031)

## 2015-09-14 LAB — URINALYSIS, ROUTINE W REFLEX MICROSCOPIC
Glucose, UA: NEGATIVE mg/dL
HGB URINE DIPSTICK: NEGATIVE
Leukocytes, UA: NEGATIVE
NITRITE: NEGATIVE
Protein, ur: NEGATIVE mg/dL
Specific Gravity, Urine: 1.026 (ref 1.005–1.030)
pH: 6 (ref 5.0–8.0)

## 2015-09-14 LAB — CBC
HCT: 39.5 % (ref 36.0–46.0)
Hemoglobin: 14 g/dL (ref 12.0–15.0)
MCH: 32.5 pg (ref 26.0–34.0)
MCHC: 35.4 g/dL (ref 30.0–36.0)
MCV: 91.6 fL (ref 78.0–100.0)
PLATELETS: 553 10*3/uL — AB (ref 150–400)
RBC: 4.31 MIL/uL (ref 3.87–5.11)
RDW: 13 % (ref 11.5–15.5)
WBC: 13.3 10*3/uL — ABNORMAL HIGH (ref 4.0–10.5)

## 2015-09-14 LAB — LIPASE, BLOOD: Lipase: 16 U/L (ref 11–51)

## 2015-09-14 LAB — RAPID URINE DRUG SCREEN, HOSP PERFORMED
AMPHETAMINES: NOT DETECTED
BENZODIAZEPINES: NOT DETECTED
Barbiturates: NOT DETECTED
COCAINE: NOT DETECTED
OPIATES: POSITIVE — AB
Tetrahydrocannabinol: POSITIVE — AB

## 2015-09-14 LAB — COMPREHENSIVE METABOLIC PANEL
ALBUMIN: 3.9 g/dL (ref 3.5–5.0)
ALK PHOS: 92 U/L (ref 38–126)
ALT: 12 U/L — AB (ref 14–54)
ANION GAP: 15 (ref 5–15)
AST: 22 U/L (ref 15–41)
BILIRUBIN TOTAL: 0.7 mg/dL (ref 0.3–1.2)
CALCIUM: 9.6 mg/dL (ref 8.9–10.3)
CO2: 24 mmol/L (ref 22–32)
CREATININE: 0.78 mg/dL (ref 0.44–1.00)
Chloride: 95 mmol/L — ABNORMAL LOW (ref 101–111)
GFR calc Af Amer: >60 mL/min (ref >60)
GFR calc non Af Amer: >60 mL/min (ref >60)
GLUCOSE: 143 mg/dL — AB (ref 65–99)
Potassium: 2.6 mmol/L — CL (ref 3.5–5.1)
Sodium: 134 mmol/L — ABNORMAL LOW (ref 135–145)
TOTAL PROTEIN: 7.1 g/dL (ref 6.5–8.1)

## 2015-09-14 LAB — I-STAT BETA HCG BLOOD, ED (MC, WL, AP ONLY)

## 2015-09-14 LAB — MAGNESIUM
Magnesium: 1.4 mg/dL — ABNORMAL LOW (ref 1.7–2.4)
Magnesium: 2.4 mg/dL (ref 1.7–2.4)

## 2015-09-14 LAB — MRSA PCR SCREENING: MRSA BY PCR: NEGATIVE

## 2015-09-14 LAB — ETHANOL: Alcohol, Ethyl (B): <5 mg/dL (ref <5)

## 2015-09-14 MED ORDER — SODIUM CHLORIDE 0.9% FLUSH: 3.0000 mL | INTRAVENOUS | Status: DC | PRN

## 2015-09-14 MED ORDER — SODIUM CHLORIDE 0.9 % IV SOLN
INTRAVENOUS | Status: DC
Start: 2015-09-14 — End: 2015-09-15
  Administered 2015-09-14: 20:00:00 via INTRAVENOUS
  Administered 2015-09-14: 125 mL/h via INTRAVENOUS
  Administered 2015-09-15 (×2): via INTRAVENOUS

## 2015-09-14 MED ORDER — POTASSIUM CHLORIDE 10 MEQ/100ML IV SOLN
10.0000 meq | INTRAVENOUS | Status: AC
  Administered 2015-09-14 (×4): 10 meq via INTRAVENOUS
  Filled 2015-09-14 (×4): qty 100

## 2015-09-14 MED ORDER — SODIUM CHLORIDE 0.9% FLUSH
3.0000 mL | Freq: Two times a day (BID) | INTRAVENOUS | Status: DC
  Administered 2015-09-14 – 2015-09-15 (×2): 3 mL via INTRAVENOUS

## 2015-09-14 MED ORDER — PNEUMOCOCCAL VAC POLYVALENT 25 MCG/0.5ML IJ INJ
0.5000 mL | INJECTION | INTRAMUSCULAR | Status: AC
Start: 2015-09-15 — End: 2015-09-15
  Administered 2015-09-15: 0.5 mL via INTRAMUSCULAR
  Filled 2015-09-14: qty 0.5

## 2015-09-14 MED ORDER — POTASSIUM CHLORIDE CRYS ER 20 MEQ PO TBCR
40.0000 meq | EXTENDED_RELEASE_TABLET | Freq: Once | ORAL | Status: AC
  Administered 2015-09-14: 40 meq via ORAL
  Filled 2015-09-14: qty 2

## 2015-09-14 MED ORDER — MAGNESIUM SULFATE 2 GM/50ML IV SOLN
2.0000 g | Freq: Once | INTRAVENOUS | Status: AC
  Administered 2015-09-14: 2 g via INTRAVENOUS
  Filled 2015-09-14: qty 50

## 2015-09-14 MED ORDER — PANTOPRAZOLE SODIUM 40 MG IV SOLR
40.0000 mg | Freq: Once | INTRAVENOUS | Status: AC
  Administered 2015-09-14: 40 mg via INTRAVENOUS
  Filled 2015-09-14: qty 40

## 2015-09-14 MED ORDER — MELATONIN 3 MG PO TABS
3.0000 mg | ORAL_TABLET | Freq: Once | ORAL | Status: AC
  Administered 2015-09-14: 3 mg via ORAL
  Filled 2015-09-14: qty 1

## 2015-09-14 MED ORDER — SODIUM CHLORIDE 0.9 % IV BOLUS (SEPSIS)
1000.0000 mL | Freq: Once | INTRAVENOUS | Status: AC
  Administered 2015-09-14: 1000 mL via INTRAVENOUS

## 2015-09-14 MED ORDER — SODIUM CHLORIDE 0.9 % IV SOLN
INTRAVENOUS | Status: AC
  Administered 2015-09-14: 10:00:00 via INTRAVENOUS

## 2015-09-14 MED ORDER — ENOXAPARIN SODIUM 40 MG/0.4ML ~~LOC~~ SOLN
40.0000 mg | Freq: Every day | SUBCUTANEOUS | Status: DC
  Administered 2015-09-14 – 2015-09-15 (×2): 40 mg via SUBCUTANEOUS
  Filled 2015-09-14 (×2): qty 0.4

## 2015-09-14 MED ORDER — GI COCKTAIL ~~LOC~~
30.0000 mL | Freq: Once | ORAL | Status: AC
  Administered 2015-09-14: 30 mL via ORAL
  Filled 2015-09-14: qty 30

## 2015-09-14 MED ORDER — ONDANSETRON 4 MG PO TBDP
ORAL_TABLET | ORAL | Status: AC
  Administered 2015-09-14: 4 mg via ORAL
  Filled 2015-09-14: qty 1

## 2015-09-14 MED ORDER — LORAZEPAM 2 MG/ML IJ SOLN
1.0000 mg | Freq: Once | INTRAMUSCULAR | Status: AC
  Administered 2015-09-14: 1 mg via INTRAVENOUS
  Filled 2015-09-14: qty 1

## 2015-09-14 MED ORDER — VITAMIN B-1 100 MG PO TABS
100.0000 mg | ORAL_TABLET | Freq: Every day | ORAL | Status: DC
  Administered 2015-09-14 – 2015-09-15 (×2): 100 mg via ORAL
  Filled 2015-09-14 (×2): qty 1

## 2015-09-14 MED ORDER — FENTANYL CITRATE (PF) 100 MCG/2ML IJ SOLN
50.0000 ug | Freq: Once | INTRAMUSCULAR | Status: AC
  Administered 2015-09-14: 50 ug via INTRAVENOUS
  Filled 2015-09-14: qty 2

## 2015-09-14 MED ORDER — ONDANSETRON 4 MG PO TBDP
4.0000 mg | ORAL_TABLET | Freq: Once | ORAL | Status: AC | PRN
  Administered 2015-09-14: 4 mg via ORAL

## 2015-09-14 MED ORDER — FOLIC ACID 1 MG PO TABS
1.0000 mg | ORAL_TABLET | Freq: Every day | ORAL | Status: DC
  Administered 2015-09-14 – 2015-09-15 (×2): 1 mg via ORAL
  Filled 2015-09-14 (×2): qty 1

## 2015-09-14 NOTE — H&P (Signed)
Date: 09/14/2015               Patient Name:  Laura Montoya MRN: [id number]  DOB: 18-Sep-1985 Age / Sex: 30 y.o., female   PCP: Lonie Peak, PA-C         Medical Service: Internal Medicine Teaching Service         Attending Physician: Dr. Burns Spain, MD    First Contact: Dr. Deneise Lever Pager: [phone]  Second Contact: Dr. 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  She reports loose, non-bloody stools starting last Thursday which improved by Saturday and have now completely resolved.  She also had epigastric pain starting around the same time.  The pain and diarrhea were not severe enough to interfere with her normal routine.  On Saturday, she went to her daughter's soccer game and felt she became overheated.  She did hydrate with sports drinks and went home to take a nap.  When she woke Friday afternoon she felt poorly and began to have N/V.  She reports non-bloody vomiting > 10 times per day in the past day.  She was hospitalized in January with similar symptoms as above and was found to have mild gastritis on EGD w/ normal biopsy.  She has experienced intermittent abdominal pain since that admission but feels it lasted longer this time.  She was prescribed PPI which she takes intermittently.  She took 1 Aleve on Saturday but has not used NSAIDs regularly.  She denies dysphagia or odynophagia.  She has not had recent hospitalization or antibiotic use.  She denies recent EtOH use or EtOH abuse.  She tried her PPI on Saturday night when she was feeling ill.  She continued to feel poorly so she came to ED  today.  While waiting in ED this AM she began to feel her heart racing and chest pain that she says she attributes to anxiety.  She admits to being anxious about the prolonged wait time.  On my exam, both the chest and abdominal pain have resolved.  She has vomited once.  She feels the GI cocktail she received in ED has helped her symptoms.    Prior to recent onset of symptoms, she felt her usual self.  She does not work and stays home to care for her 22 year old daughter and her father.  She ambulates with the help of a cane due to peripheral neuropathy.  She denies recent sick contacts, eating at new restaurants or travel outside of the state or country.  She reports compliance with medications and there have been no new medications or recent dose changes.  She smokes about 1/2 PPD x 19 years. She occasionally smokes marijuana but says last use was two weeks ago.  She denies other illicit drug use or EtOH abuse.  Menses have been irregular since Nov 2016.  Current bHcg is negative.     Meds: Current Facility-Administered Medications  Medication Dose Route Frequency Provider Last Rate Last Dose  . 0.9 %  sodium chloride infusion   Intravenous Continuous Kristen N Ward, DO      .  fentaNYL (SUBLIMAZE) injection 50 mcg  50 mcg Intravenous Once [employer], DO      . potassium chloride 10 mEq in 100 mL IVPB  10 mEq Intravenous Q1 Hr x 4 Kristen N Ward, DO      . sodium chloride 0.9 % bolus 1,000 mL  1,000 mL Intravenous Once Kristen N Ward, DO 1,000 mL/hr at 09/14/15 0647 1,000 mL at 09/14/15 0647  . sodium chloride 0.9 % bolus 1,000 mL  1,000 mL Intravenous Once Layla Maw Ward, DO       Current Outpatient Prescriptions  Medication Sig Dispense Refill  . acetaminophen-codeine (TYLENOL #3) 300-30 MG tablet Take 1 tablet by mouth 2 (two) times daily as needed for moderate pain. 60 tablet 1  . ondansetron (ZOFRAN ODT) 4 MG disintegrating tablet 4mg  ODT q4 hours prn nausea/vomit (Patient taking  differently: Take 4 mg by mouth every 4 (four) hours as needed for nausea. ) 20 tablet 0  . tiZANidine (ZANAFLEX) 4 MG tablet Take 1 tablet (4 mg total) by mouth every 6 (six) hours as needed for muscle spasms. 75 tablet 3  . meclizine (ANTIVERT) 32 MG tablet Take 1 tablet (32 mg total) by mouth 3 (three) times daily. For 3 days scheduled then as needed thereafter (Patient not taking: Reported on 09/14/2015) 60 tablet 1  . nortriptyline (PAMELOR) 50 MG capsule Take 1 capsule (50 mg total) by mouth at bedtime. (Patient not taking: Reported on 09/14/2015) 30 capsule 2  . omeprazole (PRILOSEC) 40 MG capsule Take 1 capsule (40 mg total) by mouth daily. (Patient not taking: Reported on 09/14/2015) 60 capsule 1   Abilify 5mg  daily Thiamine 100mg  daily Folic acid Vit D - calcium  Allergies: Allergies as of 09/14/2015 - Review Complete 09/14/2015  Allergen Reaction Noted  . Lavender oil Rash 03/01/2015   Past Medical History  Diagnosis Date  . Asthma   . Scoliosis   . Schizophrenia (HCC)   . Mood disorder (HCC)   . Anxiety   . Dysmenorrhea   . Headache   . Dizziness   . Thrush    Past Surgical History  Procedure Laterality Date  . Breast surgery    . Mouth surgery     Family History  Problem Relation Age of Onset  . Thyroid disease Mother   . Mental illness Father   . Asthma Mother   . Hyperlipidemia Father   . Healthy Brother   . Healthy Daughter   . Heart disease Paternal Grandfather   . Heart disease Paternal Grandmother    Social History   Social History  . Marital Status: Single    Spouse Name: N/A  . Number of Children: 1  . Years of Education: GED   Occupational History  . N/A    Social History Main Topics  . Smoking status: Current Every Day Smoker -- 0.25 packs/day for 16 years    Types: Cigarettes  . Smokeless tobacco: Never Used  . Alcohol Use: 0.0 oz/week    0 Standard drinks or equivalent per week     Comment: One wine cooler per month  . Drug Use: No    . Sexual Activity: Not on file   Other Topics Concern  . Not on file   Social History Narrative   Has not had caffeine for 1 month 05/27/15.  Lives with parents and daughter in a one story home.  Does not work.  Has a disability hearing coming up on July 16, 2015.  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 Has not worked since 2015.     Education: GED    Review of Systems: Heart:  Chest pain and palpitations in ED but otherwise does not usually experience CP; + PND; no DOE or increased LE edema Lungs:  Denies cough or dyspnea GI:  Per HPI GU:  Denies difficulty urinating, hematuria, dysuria or vaginal discharge; + irregular menses x 6 months Neuro:  + chronic numbness/tingling of hands/ankles/feet; + occasional vertigo (none recently), denies unilateral weakness, denies LOC, denies change in vision or hearing, denies headache  Physical Exam: Blood pressure 123/88, pulse 69, temperature 98.2 F (36.8 C), temperature source Oral, resp. rate 20, SpO2 98 %. General: sitting up in bed in NAD, non-toxic appearing, pleasant and cooperative HEENT: PERRL, EOMI, no scleral icterus, OP clear and moist, neck supple Cardiac: RRR, no rubs, murmurs or gallops Pulm: clear to auscultation bilaterally, moving normal volumes of air Abd: soft, nontender (palpated deeply in all quadrants), nondistended, BS present in all 4 quadrants, there is no rebound tenderness or guarding; there is no CVAT Ext: warm and well perfused, no pedal edema Neuro: alert and oriented X3, cranial nerves II-XII grossly intact, MAE x 4, 5/5 MMS upper and lower extremities, sensation grossly intact   Lab results: Basic Metabolic Panel:  Recent Labs  16/01/9604/22/17 0300  NA 134*  K 2.6*  CL 95*  CO2 24  GLUCOSE 143*  BUN <5*  CREATININE 0.78  CALCIUM 9.6   Liver Function Tests:  Recent Labs  09/14/15 0300  AST 22  ALT 12*  ALKPHOS 92  BILITOT 0.7  PROT 7.1  ALBUMIN 3.9    Recent Labs  09/14/15 0300  LIPASE 16    CBC:  Recent Labs  09/14/15 0300  WBC 13.3*  HGB 14.0  HCT 39.5  MCV 91.6  PLT 553*   Cardiac Enzymes:  Recent Labs  09/14/15 0831  TROPONINI <0.03   Urine Drug Screen: Drugs of Abuse  No results found for: LABOPIA, COCAINSCRNUR, LABBENZ, AMPHETMU, THCU, LABBARB  Alcohol Level:  Recent Labs  09/14/15 0831  ETH <5   Urinalysis:  Recent Labs  09/14/15 0307  COLORURINE AMBER*  LABSPEC 1.026  PHURINE 6.0  GLUCOSEU NEGATIVE  HGBUR NEGATIVE  BILIRUBINUR SMALL*  KETONESUR >80*  PROTEINUR NEGATIVE  NITRITE NEGATIVE  LEUKOCYTESUR NEGATIVE    Imaging results:  Dg Abd Acute W/chest  09/14/2015  CLINICAL DATA:  CP, upper abd pain, V/D X 3 days. EXAM: DG ABDOMEN ACUTE W/ 1V CHEST COMPARISON:  04/29/2015 FINDINGS: The heart size and vascular pattern are normal. The lungs are clear. There are no pleural effusions. There is no evidence of pneumoperitoneum. There are no abnormally dilated loops of bowel and no abnormal air-fluid levels. No abnormal opacities over the abdomen. In the right inferior pelvis there is a 3 cm oval faintly hyper attenuating structure. This may represent bowel contents. CT scan performed 04/28/2015 demonstrated no evidence of calcified or noncalcified fibroid. Mild levoscoliosis lumbar spine. IMPRESSION: No acute cardiopulmonary process.  Nonobstructive bowel gas pattern. Electronically Signed   By: Esperanza Heiraymond  Rubner M.D.   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  Vomiting and diarrhea:  Differential includes gastritis vs viral gastroenteritis given short duration of improving symptoms  and response to GI cocktail and fluids.  Lipase normal and hx not c/w pancreatitis.  Slight leukocytosis but afebrile, stable vitals and  pain has resolved completely in ED (subjectively and objectively) so I doubt acute appendicitis or cholecystitis.  There is no pelvic pain or vaginal symptoms to suggest Gyn infection.  Pregnancy test is negative.  A1c was normal, 4.9, three months ago.  Symptoms seem to be improving and she would like to eat. - continue IV fluids - replete electrolytes - clear liquid diet for now  - follow-up UDS results - has already received IV PPI in ED, will hold on further PPI given prolonged QTc - no anti-motility (diarrhea has resolved) and avoid anti-emetics given prolonged QTc - H2 blocker may be a better alternative PPI given prolonged QT  Fasting ketosis:  Likely due to decreased po and vomiting in recent days.   EPIC chart review reveals a hx of malnutrition, she is taking several vitamin supplements.  She reports being picky about what she eats and trying to eat Organic. - supplementing K and Mg - encourage po as tolerated - follow-up repeat lytes  Hypokalemia and hypomagnesemia:  Likely 2/2 vomiting and diarrhea.   - supplementing K and Mg - telemetry monitoring - repeat BMP  Prolonged QT:  Likely multifactorial due to electrolyte abnormalities and use of antipsychotic (Abilify) and TCA (nortriptyline).  She is prescribed Zofran and PPI but rarely takes these medications.  She took 4 zofran in past few days and 1 PPI in past few days.   - telemetry monitoring - avoid QT prolonging medications (hold Abilify, Zofran, PPI for now) - supplementing K and Mg   Vitamin deficiencies and thiamine deficiency neuropathy::  No hx of EtOH abuse reported by patient or noted in chart.  Neuro notes report deficiency due to malnutrition.  Receiving B12 injections at neuro office.  Takes thiamine, folate and vit D/calcium daily.  - continue po vitamins - encourage po as tolerated - consider nutrition consult  - hold nortriptyline given prolonged QT    - can try voltaren gel or capsaicin cream if  needed  Tobacco abuse:  1/2 PPD since age 74.  She says she is not ready to quit. - advise cessation  Diet:  Clear liquid, ADAT VTE ppx:  [place] Lovenox, SCDs Code Status:  Full  Dispo: Disposition is deferred at this time, awaiting improvement of current medical problems. Anticipated discharge in approximately 1-2 day(s).   The patient does have a current PCP Lonie Peak, PA-C) and does not need an [facility] hospital follow-up appointment after discharge.  The patient does not have transportation limitations that hinder transportation to clinic appointments.  Signed: Yolanda Manges, DO 09/14/2015, 7:06 AM

## 2015-09-14 NOTE — Progress Notes (Signed)
Pt received from ED per stretcher - alert & oriented to 2C05  CHG bath done and MRSA swab done  sent to lab

## 2015-09-14 NOTE — ED Notes (Signed)
Pt states n/v x 24 hours. Did have some diarrhea prior to onset of vomiting. Took zofran 2 hours ago

## 2015-09-14 NOTE — ED Provider Notes (Addendum)
TIME SEEN: 6:00 AM  CHIEF COMPLAINT: Vomiting and diarrhea  HPI: Patient is a 30 y.o. female who presents emergency department with 2 days of vomiting and diarrhea. States her last episode of diarrhea was 2 days ago but has had over 15 episodes of nonbloody, nonbilious vomiting in the past 24 hours. States she's had similar symptoms in the past with gastritis. She is having upper abdominal pain that she describes as a "fullness". No radiation of this pain. No fevers but is having chills. No history of abdominal surgery. No dysuria, hematuria, vaginal bleeding or discharge. Last menstrual period was November. She states it is normal for her to have this irregular menstrual cycles. Denies any recent travel, sick contacts or antibiotic use. Has been admitted to the hospital for dehydration before. States she has had an endoscopy that confirmed gastritis and states this feels the same. No history of NSAID use, alcohol use. No bloody stools or melena. States on the waiting room she developed central chest tightness without radiation and shortness of breath. States she feels that this is anxiety. No history of CAD for herself or family. No history of hypertension, diabetes or hyperlipidemia. No history of PE or DVT. No lower extremity swelling or pain.  ROS: See HPI Constitutional: no fever  Eyes: no drainage  ENT: no runny nose   Cardiovascular:   chest pain  Resp:  SOB  GI: vomiting GU: no dysuria Integumentary: no rash  Allergy: no hives  Musculoskeletal: no leg swelling  Neurological: no slurred speech ROS otherwise negative  PAST MEDICAL HISTORY/PAST SURGICAL HISTORY:  Past Medical History  Diagnosis Date  . Asthma   . Scoliosis   . Schizophrenia (HCC)   . Mood disorder (HCC)   . Anxiety   . Dysmenorrhea   . Headache   . Dizziness   . Thrush     MEDICATIONS:  Prior to Admission medications   Medication Sig Start Date End Date Taking? Authorizing Provider  acetaminophen-codeine  (TYLENOL #3) 300-30 MG tablet Take 1 tablet by mouth 2 (two) times daily as needed for moderate pain. 08/03/15  Yes Ranelle Oyster, MD  ondansetron (ZOFRAN ODT) 4 MG disintegrating tablet  ODT q4 hours prn nausea/vomit Patient taking differently: Take 4 mg by mouth every 4 (four) hours as needed for nausea.  03/02/15  Yes Benjamin Cartner, PA-C  tiZANidine (ZANAFLEX) 4 MG tablet Take 1 tablet (4 mg total) by mouth every 6 (six) hours as needed for muscle spasms. 08/03/15  Yes Ranelle Oyster, MD  meclizine (ANTIVERT) 32 MG tablet Take 1 tablet (32 mg total) by mouth 3 (three) times daily. For 3 days scheduled then as needed thereafter Patient not taking: Reported on 09/14/2015 03/04/15   Ranelle Oyster, MD  nortriptyline (PAMELOR) 50 MG capsule Take 1 capsule (50 mg total) by mouth at bedtime. Patient not taking: Reported on 09/14/2015 06/08/15   Jones Bales, NP  omeprazole (PRILOSEC) 40 MG capsule Take 1 capsule (40 mg total) by mouth daily. Patient not taking: Reported on 09/14/2015 05/08/15   Ruffin Frederick, MD    ALLERGIES:  Allergies  Allergen Reactions  . Lavender Oil Rash    SOCIAL HISTORY:  Social History  Substance Use Topics  . Smoking status: Current Every Day Smoker -- 0.25 packs/day for 16 years    Types: Cigarettes  . Smokeless tobacco: Never Used  . Alcohol Use: 0.0 oz/week    0 Standard drinks or equivalent per week  Comment: One wine cooler per month    FAMILY HISTORY: Family History  Problem Relation Age of Onset  . Thyroid disease Mother   . Mental illness Father   . Asthma Mother   . Hyperlipidemia Father   . Healthy Brother   . Healthy Daughter     EXAM: BP 123/88 mmHg  Pulse 69  Temp(Src) 98.2 F (36.8 C) (Oral)  Resp 20  SpO2 98% CONSTITUTIONAL: Alert and oriented and responds appropriately to questions. Appears uncomfortable HEAD: Normocephalic EYES: Conjunctivae clear, PERRL ENT: normal nose; no rhinorrhea; dry mucous  membranes NECK: Supple, no meningismus, no LAD  CARD: RRR; S1 and S2 appreciated; no murmurs, no clicks, no rubs, no gallops RESP: Normal chest excursion without splinting or tachypnea; breath sounds clear and equal bilaterally; no wheezes, no rhonchi, no rales, no hypoxia or respiratory distress, speaking full sentences ABD/GI: Normal bowel sounds; non-distended; soft, tender to palpation in the epigastric region, negative Murphy sign, no tenderness at McBurney's point, no rebound, no guarding, no peritoneal signs BACK:  The back appears normal and is non-tender to palpation, there is no CVA tenderness EXT: Normal ROM in all joints; non-tender to palpation; no edema; normal capillary refill; no cyanosis, no calf tenderness or swelling    SKIN: Normal color for age and race; warm; no rash NEURO: Moves all extremities equally, sensation to light touch intact diffusely, cranial nerves II through XII intact PSYCH: The patient's mood and manner are appropriate. Grooming and personal hygiene are appropriate.  MEDICAL DECISION MAKING: Patient here with vomiting and diarrhea. Has had similar symptoms several times in the past and has been seen by gastroenterologist. States that this feels like her prior gastritis. Denies any fevers.  States her abdominal pain started after her vomiting. States that Dr. has helped her significant edema past. She states she is having central chest tightness and shortness of breath but feels like this is because of being anxious from feeling so poorly. This started while in the emergency department. No history of PE, DVT, CAD. EKG shows prolonged QTC but no other ischemic abnormality. She does have a potassium of 2.6, large ketones in her urine. Will give aggressive IV hydration and IV potassium. We'll give her Ativan to help with her nausea as I do not want to give her Zofran and Phenergan at this time as they may prolong her QTC further. She is currently on a cardiac monitor as  well as the sole. We'll also obtain a magnesium level. Feel she will need admission to step down for hydration and repletion of her electrolytes.  ED PROGRESS: 6:20 AM  Discussed patient's case with internal medicine, Dr. Blair Promiseivett.  Recommend admission to step down, inpatient bed.  I will place holding orders per their request. Patient and family (if present) updated with plan. Care transferred to internal medicine service.  Attending is Dr. Rogelia BogaButcher.  I reviewed all nursing notes, vitals, pertinent old records, EKGs, labs, imaging (as available).      EKG Interpretation  Date/Time:  Monday Sep 14 2015 05:30:21 EDT Ventricular Rate:  76 PR Interval:    QRS Duration: 112 QT Interval:  498 QTC Calculation: 560 R Axis:   96 Text Interpretation:  Critical Test Result: Long QTc Atrial fibrillation Rightward axis Incomplete right bundle branch block Prolonged QT Abnormal ECG Prolonged QT compared to prior Confirmed by Jadene PieriniWARD,  DO, KRISTEN [phone]([id number]) on 09/14/2015 6:17:50 AM        EKG Interpretation  Date/Time:  Monday Sep 14 2015 05:45:15 EDT Ventricular Rate:  59 PR Interval:  93 QRS Duration: 124 QT Interval:  601 QTC Calculation: 595 R Axis:   102 Text Interpretation:  Sinus rhythm Atrial premature complex Short PR interval RBBB and LPFB Nonspecific T abnormalities, lateral leads Prolonged QT Confirmed by WARD,  DO, KRISTEN ([id number]) on 09/14/2015 6:30:22 AM          CRITICAL CARE Performed by: Raelyn Number   Total critical care time: 40 minutes  Critical care time was exclusive of separately billable procedures and treating other patients.  Critical care was necessary to treat or prevent imminent or life-threatening deterioration.  Critical care was time spent personally by me on the following activities: development of treatment plan with patient and/or surrogate as well as nursing, discussions with consultants, evaluation of patient's response to treatment, examination of  patient, obtaining history from patient or surrogate, ordering and performing treatments and interventions, ordering and review of laboratory studies, ordering and review of radiographic studies, pulse oximetry and re-evaluation of patient's condition.       Layla Maw Ward, DO 09/14/15 0630    7:10 AM  Magnesium is 1.4 - will replace this as well.  Layla Maw Ward, DO 09/14/15 [phone]

## 2015-09-14 NOTE — Progress Notes (Signed)
1843 recent lab work results relayed to MD  And pt feeling hungr No Nausea and vomiting . MD responded back no further orders given

## 2015-09-14 NOTE — Progress Notes (Signed)
1445 transferred in from 2 south via wheelchair . Ambulated to bed by  Supervision . Gait steady .wih occasional tremors to r hand .Kept comfortaBLE IN BED WITH scd IN PLACE.ivf in progress.chem 7 = mag drawn by lab

## 2015-09-14 NOTE — Progress Notes (Signed)
Pt transferred to 3 east room 30 per w/c with belongings per Nurse tech Pt has her cell phone and charger with her in her lap

## 2015-09-14 NOTE — Progress Notes (Signed)
Called report to [address] for pt to transfer to room [facility]      . Pt should of gone to telemetry from ED. Order was in signed and held orders.

## 2015-09-14 NOTE — Progress Notes (Signed)
1745 resting comfortably . Visited by mother and her daughter. No apparent distress noted

## 2015-09-15 ENCOUNTER — Other Ambulatory Visit: Payer: Self-pay

## 2015-09-15 DIAGNOSIS — G8929 Other chronic pain: Secondary | ICD-10-CM | POA: Diagnosis not present

## 2015-09-15 DIAGNOSIS — A084 Viral intestinal infection, unspecified: Principal | ICD-10-CM

## 2015-09-15 DIAGNOSIS — F209 Schizophrenia, unspecified: Secondary | ICD-10-CM | POA: Diagnosis not present

## 2015-09-15 DIAGNOSIS — G629 Polyneuropathy, unspecified: Secondary | ICD-10-CM | POA: Diagnosis not present

## 2015-09-15 LAB — MAGNESIUM: MAGNESIUM: 2.1 mg/dL (ref 1.7–2.4)

## 2015-09-15 LAB — CBC WITH DIFFERENTIAL/PLATELET
Basophils Absolute: 0 10*3/uL (ref 0.0–0.1)
Basophils Relative: 0 %
EOS PCT: 0 %
Eosinophils Absolute: 0 10*3/uL (ref 0.0–0.7)
HCT: 32.1 % — ABNORMAL LOW (ref 36.0–46.0)
Hemoglobin: 10.9 g/dL — ABNORMAL LOW (ref 12.0–15.0)
LYMPHS ABS: 3.6 10*3/uL (ref 0.7–4.0)
Lymphocytes Relative: 52 %
MCH: 32.5 pg (ref 26.0–34.0)
MCHC: 34 g/dL (ref 30.0–36.0)
MCV: 95.8 fL (ref 78.0–100.0)
MONO ABS: 0.4 10*3/uL (ref 0.1–1.0)
Monocytes Relative: 5 %
Neutro Abs: 2.9 10*3/uL (ref 1.7–7.7)
Neutrophils Relative %: 42 %
PLATELETS: 229 10*3/uL (ref 150–400)
RBC: 3.35 MIL/uL — ABNORMAL LOW (ref 3.87–5.11)
RDW: 13.9 % (ref 11.5–15.5)
WBC: 6.9 10*3/uL (ref 4.0–10.5)

## 2015-09-15 LAB — BASIC METABOLIC PANEL
Anion gap: 3 — ABNORMAL LOW (ref 5–15)
CHLORIDE: 111 mmol/L (ref 101–111)
CO2: 25 mmol/L (ref 22–32)
CREATININE: 0.65 mg/dL (ref 0.44–1.00)
Calcium: 8.2 mg/dL — ABNORMAL LOW (ref 8.9–10.3)
GFR calc Af Amer: >60 mL/min (ref >60)
GFR calc non Af Amer: >60 mL/min (ref >60)
Glucose, Bld: 87 mg/dL (ref 65–99)
Potassium: 3.9 mmol/L (ref 3.5–5.1)
SODIUM: 139 mmol/L (ref 135–145)

## 2015-09-15 MED ORDER — GI COCKTAIL ~~LOC~~
30.0000 mL | Freq: Once | ORAL | Status: AC
  Administered 2015-09-15: 30 mL via ORAL
  Filled 2015-09-15: qty 30

## 2015-09-15 MED ORDER — PROCHLORPERAZINE EDISYLATE 5 MG/ML IJ SOLN
5.0000 mg | Freq: Once | INTRAMUSCULAR | Status: AC
  Administered 2015-09-15: 5 mg via INTRAVENOUS
  Filled 2015-09-15: qty 1

## 2015-09-15 MED ORDER — ONDANSETRON HCL 4 MG/2ML IJ SOLN: 4.0000 mg | Freq: Once | INTRAMUSCULAR | Status: DC

## 2015-09-15 MED ORDER — LORAZEPAM 2 MG/ML IJ SOLN
0.5000 mg | Freq: Once | INTRAMUSCULAR | Status: AC
  Administered 2015-09-15: 0.5 mg via INTRAVENOUS
  Filled 2015-09-15: qty 1

## 2015-09-15 MED ORDER — ONDANSETRON 4 MG PO TBDP: 4.0000 mg | ORAL_TABLET | Freq: Three times a day (TID) | ORAL | Status: DC | PRN

## 2015-09-15 MED ORDER — LORAZEPAM 0.5 MG PO TABS
0.5000 mg | ORAL_TABLET | Freq: Once | ORAL | Status: AC
  Administered 2015-09-15: 0.5 mg via ORAL
  Filled 2015-09-15: qty 1

## 2015-09-15 MED ORDER — ACETAMINOPHEN 500 MG PO TABS
1000.0000 mg | ORAL_TABLET | Freq: Once | ORAL | Status: AC
  Administered 2015-09-15: 1000 mg via ORAL
  Filled 2015-09-15: qty 2

## 2015-09-15 NOTE — Progress Notes (Signed)
Per AM EKG pt in "sinus bradycardia with marked sinus arrhythmia with short PR" PR , QTc . HR 50-60s. EKG not loading in epic currently and placed in paper chart. MD on call notified.

## 2015-09-15 NOTE — Progress Notes (Signed)
1554 discharge teaching and instruction given . Seen by nutritionist before disccharge

## 2015-09-15 NOTE — Progress Notes (Signed)
Pt c/o sharp pain to back on the right side that goes to her right breast, pt states she is anxious and shaky. Pt states pressing on the area helps the pain as well as a heat pack, but is requesting medicine for anxiety and pain. Dr. Allena KatzPatel notified.  New order for tylenol, pt also requesting nausea medicine at this time. MD paged.

## 2015-09-15 NOTE — Progress Notes (Signed)
  Date: 09/15/2015  Patient name: Laura Montoya  Medical record number: [id number]  Date of birth: [birth year]   I have seen and evaluated Laura Montoya and discussed their care with the Residency Team. Ms Laura Montoya is a 30 yo female admitted with 2 days of D followed by 2 days of N/V/abd pain. All her sxs had resolved at the time of admission and she progressed to a regular diet. This AM though she had N/V/abd pain. She was treated with compazine and changed to clears.  She smokes THC daily. She is edentulous - pt states from family condition where jaw bone too weak to support teeth and she finally had her remaining teeth pulled at age 30. She can eat anything she wants except nuts. She has false teeth but they do not fit well and she does not use.   Filed Vitals:   09/15/15 0741 09/15/15 1149  BP: 137/55 98/61  Pulse: 71 59  Temp:  98 F (36.7 C)  Resp: 18 17  Afebrile NAD Gross termor of R UE, mostly hand that varies in intensity HRRR LCTAB ABD + BS, grimaces even before Dr Johnny BridgeSaraiya started to palpate  K 3.5 - 3.9 HgB 10.9 UDS + opiates and THC ETOH neg  I personally viewed his CXR images and confirmed by reading with the official read. NAD  I personally viewed his EKG and confirmed by reading with the official read. Sinus arrthymia, no ischemic changes  Assessment and Plan: I have seen and evaluated the patient as outlined above. I agree with the formulated Assessment and Plan as detailed in the residents' note, with the following changes: The pt presents with N/V/D and abd pain for 4 days. This is very similar to her sxs at her Jan admission. She was sxs free from Jan D/C to now. She does use THC daily. Her diff dx would inc a viral gastroenteritis, cannabinoid hyperemesis, cyclical vomiting syndrome. Her D does not fit with these latter 2 dx. IBS is also possible although V is not classic.  1. Viral gastroenteritis - cont symptomatic tx. She is medically stable for D/C  with anti-emetics and a bland , clear liquid diet. We would encourage complete THC cessation in case she does have an element of cannabinoid hyperemesis .   Burns SpainElizabeth A Butcher, MD 5/23/20171:50 PM

## 2015-09-15 NOTE — Progress Notes (Signed)
Subjective:  Pt had nausea and 4-5 episodes of vomiting that started around 5 AM. This morning she was not able to keep in ginger ale. She also exhibited some tremors. Had one episode of diarrhea GI cocktail was given, and also ativan and compazine.  She says she smokes marijuana daily, and we counseled her that this can sometimes cause recurrent episodes of vomiting. She also says she does not take PPI on a regular basis- had an EGD earlier which showed mild gastritis.  She says she had some issue with her teeth and they fell due to teeth laxity? Has not been able to go back due to dentist due to medicaid issues.    Objective: Vital signs in last 24 hours: Filed Vitals:   09/15/15 0300 09/15/15 0431 09/15/15 0648 09/15/15 0741  BP: 105/69 143/80 136/73 137/55  Pulse: 64  58 71  Temp: 98 F (36.7 C)  97.8 F (36.6 C)   TempSrc: Oral  Oral   Resp: 17   18  Height:      Weight:   125 lb 14.4 oz (57.108 kg)   SpO2: 99% 100% 100% 100%   Weight change:   Intake/Output Summary (Last 24 hours) at 09/15/15 1021 Last data filed at 09/15/15 0900  Gross per 24 hour  Intake 4977.5 ml  Output    800 ml  Net 4177.5 ml   General: Vital signs reviewed. Patient  Mildly uncomfortable \ HEENT: edentulous in upper teeth Cardiovascular: regular rate, rhythm, no murmur appreciated  Pulmonary/Chest: Clear to auscultation bilaterally, no wheezes, rales, or rhonchi. Abdominal: some tenderness to palpation  Extremities: trace pitting edema    Lab Results: Results for orders placed or performed during the hospital encounter of 09/14/15 (from the past 24 hour(s))  Basic metabolic panel     Status: Abnormal   Collection Time: 09/14/15  2:35 PM  Result Value Ref Range   Sodium 137 135 - 145 mmol/L   Potassium 3.5 3.5 - 5.1 mmol/L   Chloride 110 101 - 111 mmol/L   CO2 23 22 - 32 mmol/L   Glucose, Bld 93 65 - 99 mg/dL   BUN <5 (L) 6 - 20 mg/dL   Creatinine, Ser 1.610.60 0.44 - 1.00 mg/dL   Calcium 7.8 (L) 8.9 - 10.3 mg/dL   GFR calc non Af Amer >60 >60 mL/min   GFR calc Af Amer >60 >60 mL/min   Anion gap 4 (L) 5 - 15  Magnesium     Status: None   Collection Time: 09/14/15  2:35 PM  Result Value Ref Range   Magnesium 2.4 1.7 - 2.4 mg/dL  Basic metabolic panel     Status: Abnormal   Collection Time: 09/15/15  2:18 AM  Result Value Ref Range   Sodium 139 135 - 145 mmol/L   Potassium 3.9 3.5 - 5.1 mmol/L   Chloride 111 101 - 111 mmol/L   CO2 25 22 - 32 mmol/L   Glucose, Bld 87 65 - 99 mg/dL   BUN <5 (L) 6 - 20 mg/dL   Creatinine, Ser 0.960.65 0.44 - 1.00 mg/dL   Calcium 8.2 (L) 8.9 - 10.3 mg/dL   GFR calc non Af Amer >60 >60 mL/min   GFR calc Af Amer >60 >60 mL/min   Anion gap 3 (L) 5 - 15  Magnesium     Status: None   Collection Time: 09/15/15  2:18 AM  Result Value Ref Range   Magnesium 2.1 1.7 - 2.4 mg/dL  CBC with Differential/Platelet     Status: Abnormal   Collection Time: 09/15/15  2:18 AM  Result Value Ref Range   WBC 6.9 4.0 - 10.5 K/uL   RBC 3.35 (L) 3.87 - 5.11 MIL/uL   Hemoglobin 10.9 (L) 12.0 - 15.0 g/dL   HCT 96.0 (L) 45.4 - 09.8 %   MCV 95.8 78.0 - 100.0 fL   MCH 32.5 26.0 - 34.0 pg   MCHC 34.0 30.0 - 36.0 g/dL   RDW 11.9 14.7 - 82.9 %   Platelets 229 150 - 400 K/uL   Neutrophils Relative % 42 %   Neutro Abs 2.9 1.7 - 7.7 K/uL   Lymphocytes Relative 52 %   Lymphs Abs 3.6 0.7 - 4.0 K/uL   Monocytes Relative 5 %   Monocytes Absolute 0.4 0.1 - 1.0 K/uL   Eosinophils Relative 0 %   Eosinophils Absolute 0.0 0.0 - 0.7 K/uL   Basophils Relative 0 %   Basophils Absolute 0.0 0.0 - 0.1 K/uL     Micro Results: Recent Results (from the past 240 hour(s))  MRSA PCR Screening     Status: None   Collection Time: 09/14/15 10:24 AM  Result Value Ref Range Status   MRSA by PCR NEGATIVE NEGATIVE Final    Comment:        The GeneXpert MRSA Assay (FDA approved for NASAL specimens only), is one component of a comprehensive MRSA colonization surveillance  program. It is not intended to diagnose MRSA infection nor to guide or monitor treatment for MRSA infections.    Studies/Results: Dg Abd Acute W/chest  09/14/2015  CLINICAL DATA:  CP, upper abd pain, V/D X 3 days. EXAM: DG ABDOMEN ACUTE W/ 1V CHEST COMPARISON:  04/29/2015 FINDINGS: The heart size and vascular pattern are normal. The lungs are clear. There are no pleural effusions. There is no evidence of pneumoperitoneum. There are no abnormally dilated loops of bowel and no abnormal air-fluid levels. No abnormal opacities over the abdomen. In the right inferior pelvis there is a 3 cm oval faintly hyper attenuating structure. This may represent bowel contents. CT scan performed 04/28/2015 demonstrated no evidence of calcified or noncalcified fibroid. Mild levoscoliosis lumbar spine. IMPRESSION: No acute cardiopulmonary process.  Nonobstructive bowel gas pattern. Electronically Signed   By: Esperanza Heir M.D.   On: 09/14/2015 07:09   Medications: I have reviewed the patient's current medications. Scheduled Meds: . enoxaparin (LOVENOX) injection  40 mg Subcutaneous Daily  . folic acid  1 mg Oral Daily  . LORazepam  0.5 mg Intravenous Once  . pneumococcal 23 valent vaccine  0.5 mL Intramuscular Tomorrow-1000  . prochlorperazine  5 mg Intravenous Once  . sodium chloride flush  3 mL Intravenous Q12H  . thiamine  100 mg Oral Daily   Continuous Infusions: . sodium chloride 125 mL/hr at 09/15/15 0353   PRN Meds:.sodium chloride flush Assessment/Plan: Principal Problem:   Vomiting and diarrhea Active Problems:   Thiamine deficiency neuropathy   Vitamin B12 deficiency   Folate deficiency   Prolonged QT interval   Hypomagnesemia   Hypokalemia   Tobacco use disorder  Nausea, vomiting, diarrhea: likely due to viral gastroenteritis, which resolved in the ER itself. However, this morning pt had some more episodes of vomiting. Pregnancy negative. Lipase normal. Has been afebrile. Unlikely to  be other aetiologies like p-titis or cholecystitis. Consider IBS in the differential given psychiatric history and frequent episodes of GI upset.   -clear liquid diet, advance as tolerated  -NS  125 cc/hr -compazine -ativan once -counseled on marijuana use leading to cannabinoid hyperemesis   Prolonged QT: QTc 595 on admission, which normalized on morning EKG. Likely due to electrolyte abnormalities -resume home meds on discharge including abilify -follow up with psychiatry   Vitamin deficiency and thiamine deficiency neuropathy:  -consulted nutrition  Marijuana use:  -counseled on marijuana use and its effects, particularly cannabinoid hyperemesis     Dispo: Disposition is deferred at this time, awaiting improvement of current medical problems.  Anticipated discharge in approximately 0-1 day(s).   The patient does have a current PCP Lonie Peak, PA-C) and does need an [facility] hospital follow-up appointment after discharge.  The patient does not have transportation limitations that hinder transportation to clinic appointments.  .Services Needed at time of discharge: Y = Yes, Blank = No PT:   OT:   RN:   Equipment:   Other:     LOS: 1 day   Deneise Lever, MD 09/15/2015, 10:21 AM

## 2015-09-15 NOTE — Progress Notes (Signed)
Spoken with MD . Brock Badk'd not to wait for nutritionist will not able to see the pt before discharge . Had soup juice and jello for lunch and tolerated well . To progress diet as tolerated at home.

## 2015-09-15 NOTE — Discharge Summary (Signed)
Name: Laura Montoya MRN: [id number] DOB: 08-Sep-1985 30 y.o. PCP: Lonie Peak, PA-C  Date of Admission: 09/14/2015  5:11 AM Date of Discharge: 09/15/2015 Attending Physician: Burns Spain, MD  Discharge Diagnosis: 1. Viral gastroenteritis  Principal Problem:   Vomiting and diarrhea Active Problems:   Thiamine deficiency neuropathy   Vitamin B12 deficiency   Folate deficiency   Prolonged QT interval   Hypomagnesemia   Hypokalemia   Tobacco use disorder  Discharge Medications:   Medication List    TAKE these medications        acetaminophen-codeine 300-30 MG tablet  Commonly known as:  TYLENOL #3  Take 1 tablet by mouth 2 (two) times daily as needed for moderate pain.     meclizine 32 MG tablet  Commonly known as:  ANTIVERT  Take 1 tablet (32 mg total) by mouth 3 (three) times daily. For 3 days scheduled then as needed thereafter     nortriptyline 50 MG capsule  Commonly known as:  PAMELOR  Take 1 capsule (50 mg total) by mouth at bedtime.     omeprazole 40 MG capsule  Commonly known as:  PRILOSEC  Take 1 capsule (40 mg total) by mouth daily.     ondansetron 4 MG disintegrating tablet  Commonly known as:  ZOFRAN ODT   ODT q4 hours prn nausea/vomit     ondansetron 4 MG disintegrating tablet  Commonly known as:  ZOFRAN ODT  Take 1 tablet (4 mg total) by mouth every 8 (eight) hours as needed for nausea or vomiting.     tiZANidine 4 MG tablet  Commonly known as:  ZANAFLEX  Take 1 tablet (4 mg total) by mouth every 6 (six) hours as needed for muscle spasms.        Disposition and follow-up:   [name] was discharged from [facility] in Good condition.  At the hospital follow up visit please address:  1.  N/v/d: resolved? Marijuana use: we initiated counseling on quitting marijuana and its effects on vomiting- particularly cannabinoid hyperemesis  Malnutrition and Vitamin deficiency and thiamine deficiency  neuropathy: No h/o alcohol use. Pt has malnutrition. Receiving B12 injections. Takes thiamine, folate and Vit D/calcium daily. We consulted nutrition who recommended outpatient follow up. Prolonged QT: normalized on second day and we resumed home psychiatric meds Schizophrenia and other psychiatric meds: all psychiatric meds were resumed. Follow up outpatient psychiatrist  Smoking cessation  Pt also needs follow up with dentist if any accept medicaid to improve dentures  2.  Labs / imaging needed at time of follow-up:   3.  Pending labs/ test needing follow-up:   Follow-up Appointments:     Follow-up Information    Follow up with CONROY,NATHAN, PA-C. Go on 09/22/2015.   Specialty:  Physician Assistant   Why:  2 PM for hospital follow up    Contact information:   [facility] [address] [place] [address] [phone]       Discharge Instructions: Discharge Instructions    Diet - low sodium heart healthy    Complete by:  As directed      Discharge instructions    Complete by:  As directed   Please eat as tolerated. Please use zofran for nausea   Please stop using marijuana- stopping it will help with your nausea Please follow up with your PCP- we made you an appointment     Increase activity slowly    Complete by:  As directed  Consultations:    Procedures Performed:  Dg Abd Acute W/chest  09/14/2015  CLINICAL DATA:  CP, upper abd pain, V/D X 3 days. EXAM: DG ABDOMEN ACUTE W/ 1V CHEST COMPARISON:  04/29/2015 FINDINGS: The heart size and vascular pattern are normal. The lungs are clear. There are no pleural effusions. There is no evidence of pneumoperitoneum. There are no abnormally dilated loops of bowel and no abnormal air-fluid levels. No abnormal opacities over the abdomen. In the right inferior pelvis there is a 3 cm oval faintly hyper attenuating structure. This may represent bowel contents. CT scan performed 04/28/2015 demonstrated no  evidence of calcified or noncalcified fibroid. Mild levoscoliosis lumbar spine. IMPRESSION: No acute cardiopulmonary process.  Nonobstructive bowel gas pattern. Electronically Signed   By: Esperanza Heir M.D.   On: 09/14/2015 07:09    2D Echo:   Cardiac Cath:   Admission HPI:  [name] is a 30 year old woman with PMH of schizophrenia, chronic pain, BBPV and neuropathy 2/2 malnutrition/vitamin deficiency here with c/o N/V and abdominal pain x 2 days which was preceded by 2 days of diarrhea. She reports loose, non-bloody stools starting last Thursday which improved by Saturday and have now completely resolved. She also had epigastric pain starting around the same time. The pain and diarrhea were not severe enough to interfere with her normal routine. On Saturday, she went to her daughter's soccer game and felt she became overheated. She did hydrate with sports drinks and went home to take a nap. When she woke Friday afternoon she felt poorly and began to have N/V. She reports non-bloody vomiting > 10 times per day in the past day. She was hospitalized in January with similar symptoms as above and was found to have mild gastritis on EGD w/ normal biopsy. She has experienced intermittent abdominal pain since that admission but feels it lasted longer this time. She was prescribed PPI which she takes intermittently. She took 1 Aleve on Saturday but has not used NSAIDs regularly. She denies dysphagia or odynophagia. She has not had recent hospitalization or antibiotic use. She denies recent EtOH use or EtOH abuse. She tried her PPI on Saturday night when she was feeling ill. She continued to feel poorly so she came to ED today. While waiting in ED this AM she began to feel her heart racing and chest pain that she says she attributes to anxiety. She admits to being anxious about the prolonged wait time. On my exam, both the chest and abdominal pain have resolved. She has vomited once. She  feels the GI cocktail she received in ED has helped her symptoms.   Prior to recent onset of symptoms, she felt her usual self. She does not work and stays home to care for her 74 year old daughter and her father. She ambulates with the help of a cane due to peripheral neuropathy. She denies recent sick contacts, eating at new restaurants or travel outside of the state or country. She reports compliance with medications and there have been no new medications or recent dose changes. She smokes about 1/2 PPD x 19 years. She occasionally smokes marijuana but says last use was two weeks ago. She denies other illicit drug use or EtOH abuse. Menses have been irregular since Nov 2016. Current bHcg is negative.   Hospital Course by problem list: Principal Problem:   Vomiting and diarrhea Active Problems:   Thiamine deficiency neuropathy   Vitamin B12 deficiency   Folate deficiency   Prolonged QT interval  Hypomagnesemia   Hypokalemia   Tobacco use disorder   1. Nausea, vomiting, diarrhea: likely due to viral gastroenteritis, which resolved in the ER itself. However, the following morning pt had some more episodes of vomiting. Pregnancy negative. Lipase normal. Has been afebrile. Unlikely to be other aetiologies like p-titis or cholecystitis. Consider IBS in the differential given psychiatric history and frequent episodes of GI upset.   Prolonged QT: QTc 595 on admission, which normalized on morning EKG. Likely due to electrolyte abnormalities. We resumed home psychiatric meds on discharge including abilify.  Vitamin deficiency and thiamine deficiency neuropathy: No h/o alcohol use. Pt has malnutrition. Receiving B12 injections. Takes thiamine, folate and Vit D/calcium daily. We consulted nutrition who recommended outpatient follow up.  Marijuana use:  -counseled on marijuana use and its effects, particularly cannabinoid hyperemesis    Smoking: counseled smoking cessation   Hypokalemia  and HypoMag: Likely due to n/v. Repleted accordingly and was normalized.   Discharge Vitals:   BP 98/61 mmHg  Pulse 59  Temp(Src) 98 F (36.7 C) (Oral)  Resp 17  Ht 5\' 2"  (1.575 m)  Wt 125 lb 14.4 oz (57.108 kg)  BMI 23.02 kg/m2  SpO2 100%  Discharge Labs:  Results for orders placed or performed during the hospital encounter of 09/14/15 (from the past 24 hour(s))  Basic metabolic panel     Status: Abnormal   Collection Time: 09/14/15  2:35 PM  Result Value Ref Range   Sodium 137 135 - 145 mmol/L   Potassium 3.5 3.5 - 5.1 mmol/L   Chloride 110 101 - 111 mmol/L   CO2 23 22 - 32 mmol/L   Glucose, Bld 93 65 - 99 mg/dL   BUN <5 (L) 6 - 20 mg/dL   Creatinine, Ser 3.240.60 0.44 - 1.00 mg/dL   Calcium 7.8 (L) 8.9 - 10.3 mg/dL   GFR calc non Af Amer >60 >60 mL/min   GFR calc Af Amer >60 >60 mL/min   Anion gap 4 (L) 5 - 15  Magnesium     Status: None   Collection Time: 09/14/15  2:35 PM  Result Value Ref Range   Magnesium 2.4 1.7 - 2.4 mg/dL  Basic metabolic panel     Status: Abnormal   Collection Time: 09/15/15  2:18 AM  Result Value Ref Range   Sodium 139 135 - 145 mmol/L   Potassium 3.9 3.5 - 5.1 mmol/L   Chloride 111 101 - 111 mmol/L   CO2 25 22 - 32 mmol/L   Glucose, Bld 87 65 - 99 mg/dL   BUN <5 (L) 6 - 20 mg/dL   Creatinine, Ser 4.010.65 0.44 - 1.00 mg/dL   Calcium 8.2 (L) 8.9 - 10.3 mg/dL   GFR calc non Af Amer >60 >60 mL/min   GFR calc Af Amer >60 >60 mL/min   Anion gap 3 (L) 5 - 15  Magnesium     Status: None   Collection Time: 09/15/15  2:18 AM  Result Value Ref Range   Magnesium 2.1 1.7 - 2.4 mg/dL  CBC with Differential/Platelet     Status: Abnormal   Collection Time: 09/15/15  2:18 AM  Result Value Ref Range   WBC 6.9 4.0 - 10.5 K/uL   RBC 3.35 (L) 3.87 - 5.11 MIL/uL   Hemoglobin 10.9 (L) 12.0 - 15.0 g/dL   HCT 02.732.1 (L) 25.336.0 - 66.446.0 %   MCV 95.8 78.0 - 100.0 fL   MCH 32.5 26.0 - 34.0 pg   MCHC 34.0  30.0 - 36.0 g/dL   RDW 16.1 09.6 - 04.5 %   Platelets 229 150  - 400 K/uL   Neutrophils Relative % 42 %   Neutro Abs 2.9 1.7 - 7.7 K/uL   Lymphocytes Relative 52 %   Lymphs Abs 3.6 0.7 - 4.0 K/uL   Monocytes Relative 5 %   Monocytes Absolute 0.4 0.1 - 1.0 K/uL   Eosinophils Relative 0 %   Eosinophils Absolute 0.0 0.0 - 0.7 K/uL   Basophils Relative 0 %   Basophils Absolute 0.0 0.0 - 0.1 K/uL    Signed: Deneise Lever, MD 09/15/2015, 1:03 PM    Services Ordered on Discharge:  Equipment Ordered on Discharge:

## 2015-09-15 NOTE — Progress Notes (Signed)
1630 discharge instructions given Verbalized understanding

## 2015-09-15 NOTE — Progress Notes (Signed)
0810 Pt vomitous with previously taken food . Requesting for GI cocktail ,. Referred to Dr . Johnny BridgeSaraiya . With order

## 2015-09-15 NOTE — Progress Notes (Signed)
I spoke with the patient- she denies any nausea or vomiting since this morning. Says compazine helped her. She is eager to try a clear liquid diet.  Plan for discharge later today if she tolerates this well.  Signed Deneise LeverParth Koltin Wehmeyer PGY1 IMTS

## 2015-09-15 NOTE — Progress Notes (Signed)
Nutrition Education Note  RD consulted for poor PO intake; consult for nutrient deficiencies per RN.  30 year old woman with PMH of schizophrenia, chronic pain, BBPV and neuropathy 2/2 malnutrition/vitamin deficiency here with c/o N/V and abdominal pain x 2 days which was preceded by 2 days of diarrhea.   Reviewed patient's dietary recall. She reports taking Thiamine, Calcium/vitamin D and Vitamin B12 supplements daily. Pt reports eating a variety of foods daily, meats, dairy, vegetables, and some fruit. Discussed diet tips for nausea and diarrhea. Encouraged pt to continue taking her vitamin supplements.  Encouraged fresh fruits and vegetables, lean protein, and low fat dairy as well as whole grain sources of carbohydrates to maximize fiber intake. She states that she drinks Gatorade and juice all day; drank so much Gatorade one day that she peed and pooped blue. RD encouraged pt to drink water.  Labs and medications reviewed. No further nutrition interventions warranted at this time. Patient ready for discharge at time of visit.   Dorothea Ogleeanne Benancio Osmundson RD, LDN Inpatient Clinical Dietitian Pager: [phone] After Hours Pager: [phone]

## 2015-09-15 NOTE — Discharge Instructions (Signed)
Please eat as tolerated. Please use zofran for nausea   Please stop using marijuana- stopping it will help with your nausea Please follow up with your PCP- we made you an appointment  Please ask your PCP to refer you to a nutritionist to help increase calories and gain healthy weight .  Please follow up with a dentist to have your dentures adjusted.

## 2015-09-15 NOTE — Progress Notes (Signed)
Pt refused bed alarm tonight, pt A/Ox4 and uses cane at home

## 2015-09-18 ENCOUNTER — Ambulatory Visit (INDEPENDENT_AMBULATORY_CARE_PROVIDER_SITE_OTHER): Payer: Medicaid Other | Admitting: *Deleted

## 2015-09-18 DIAGNOSIS — E538 Deficiency of other specified B group vitamins: Secondary | ICD-10-CM

## 2015-09-18 MED ORDER — CYANOCOBALAMIN 1000 MCG/ML IJ SOLN
1000.0000 ug | Freq: Once | INTRAMUSCULAR | Status: AC
  Administered 2015-09-18: 1000 ug via INTRAMUSCULAR

## 2015-09-18 NOTE — Progress Notes (Signed)
Patient in for B12 injection. 

## 2015-09-22 ENCOUNTER — Ambulatory Visit: Payer: Medicaid Other

## 2015-10-08 ENCOUNTER — Ambulatory Visit: Payer: Medicaid Other | Admitting: Registered Nurse

## 2015-10-20 ENCOUNTER — Ambulatory Visit (INDEPENDENT_AMBULATORY_CARE_PROVIDER_SITE_OTHER): Payer: Medicaid Other | Admitting: *Deleted

## 2015-10-20 DIAGNOSIS — E538 Deficiency of other specified B group vitamins: Secondary | ICD-10-CM

## 2015-10-20 MED ORDER — CYANOCOBALAMIN 1000 MCG/ML IJ SOLN
1000.0000 ug | Freq: Once | INTRAMUSCULAR | Status: AC
  Administered 2015-10-20: 1000 ug via INTRAMUSCULAR

## 2015-10-20 NOTE — Progress Notes (Signed)
Patient in for B12 injection. 

## 2015-10-23 ENCOUNTER — Ambulatory Visit: Payer: Medicaid Other

## 2015-11-13 ENCOUNTER — Ambulatory Visit (INDEPENDENT_AMBULATORY_CARE_PROVIDER_SITE_OTHER): Payer: Medicaid Other | Admitting: *Deleted

## 2015-11-13 DIAGNOSIS — E538 Deficiency of other specified B group vitamins: Secondary | ICD-10-CM | POA: Diagnosis not present

## 2015-11-13 MED ORDER — CYANOCOBALAMIN 1000 MCG/ML IJ SOLN
1000.0000 ug | Freq: Once | INTRAMUSCULAR | Status: AC
  Administered 2015-11-13: 1000 ug via INTRAMUSCULAR

## 2015-12-01 ENCOUNTER — Encounter: Payer: Self-pay | Admitting: Neurology

## 2015-12-01 ENCOUNTER — Ambulatory Visit (INDEPENDENT_AMBULATORY_CARE_PROVIDER_SITE_OTHER): Payer: Medicaid Other | Admitting: Neurology

## 2015-12-01 ENCOUNTER — Other Ambulatory Visit (INDEPENDENT_AMBULATORY_CARE_PROVIDER_SITE_OTHER): Payer: Medicaid Other

## 2015-12-01 ENCOUNTER — Ambulatory Visit: Payer: Medicaid Other

## 2015-12-01 VITALS — BP 120/90 | HR 127 | Ht 62.0 in | Wt 136.4 lb

## 2015-12-01 DIAGNOSIS — E5111 Dry beriberi: Secondary | ICD-10-CM | POA: Diagnosis not present

## 2015-12-01 DIAGNOSIS — G63 Polyneuropathy in diseases classified elsewhere: Secondary | ICD-10-CM

## 2015-12-01 DIAGNOSIS — E538 Deficiency of other specified B group vitamins: Secondary | ICD-10-CM

## 2015-12-01 LAB — FOLATE: FOLATE: 8.1 ng/mL (ref >5.9)

## 2015-12-01 MED ORDER — CYANOCOBALAMIN 1000 MCG/ML IJ SOLN
1000.0000 ug | Freq: Once | INTRAMUSCULAR | Status: AC
  Administered 2015-12-01: 1000 ug via INTRAMUSCULAR

## 2015-12-01 NOTE — Patient Instructions (Addendum)
1.  Check thiamine and folate 2.  Vitamin B12 injection today, continue monthly injections  Return to clinic in 6 months

## 2015-12-01 NOTE — Progress Notes (Signed)
Follow-up Visit   Date: 12/01/15    Noella Kipnis MRN: [id number] DOB: 1985-07-09   Interim History: Shaddai Shapley is a 30 y.o. 30 y.o. 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  The patient was accompanied to the clinic by self.  History of present illness: Around the end of October 2016, she began experiencing gradual onset of electrical-like pain involving the lateral sole and ankles and especially worse left. Symptoms progressively worsened over the next few months and by December, she was having the painful tingling involving her hands and she started having numbness involving her lower legs. She endorses weakness of the hands and legs and has noticed difficulty opening jars and bottles. In November, she suffered a fall because of imbalance and has been using a walker since December. Prolonged walking exacerbates pain. She was briefly on gabapentin which helped with pain, but it was stopped due to leg swelling. She does not recall any preceding events, except having a flu shot around the same time. No recent travel or insect bites. In late December, she developed vomiting and abdominal pain and was hospitalized for viral gastroenteritis. She was managed symptomatically with hydration and electrolyte repletion. Since January, she has not noticed any progress worsening of symptoms and feels as if she's "hit a plateau".   She began seeing Dr. Rexene Alberts in early February for her ongoing neurological symptoms and had electrodiagnostic testing, serology testing, and imaging of her lumbar spine which was reviewed. NCS showed normal bilateral median and ulnar sensory and motor responses. Left sural as well as peroneal and tibial motor responses was normal. Late responses were also normal. Needle electrode examination showed  active denervation in bilateral medial gastrocnemius muscles and the right FCR. Paraspinal muscles appeared normal. Other muscles involving the S1 and C6/C7-myotomes were not studied. Labs showed low vitamin B12, vitmain B1, and vitamin D levels. She is not vegan or vegetarian, but only eats one meal a day. She was recommended to start OTC supplements, but due to cost, did not start this.   No history of heavy alcohol use. She occasionally has a wine cooler, about once per month.   No family history of neuromuscular disease, including neuropathy.   UPDATE 07/13/2015:  Patient is here to discuss her labs and electrodiagnostic testing.   Lab testing shows nutrient deficiency in vitamin B12, vitamin B1, and folate.  She endorses trying to eat 2 meals a day, but since the birth of her last child, has always eaten only one meal daily.  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  Demyelinating changes were not present, and she did have active denervation as previously noted. She feels that symptoms have plateaued and there has been no improvement or worsening.  She walks with a walker and had one fall last week.   UPDATE 08/12/2015:  Since her last visit, she underwent CSF testing which returned normal.  She has made an active effort to increase PO intake and has been compliant with taking her supplements (folic acid, vitamin B1, and vitamin B12) and has noticed a marked improvement in her strength and overall energy.  She is eating 3-4 oz of protein and 2-3 cups of fruits and vegetables.  She is still only eating two meals per day and snacks throughout the day.  Her balance has improved and now walks  with cane.  She had not had any interval falls.  Although her hand strength has improved, she still has difficulty with opening bottles and jars.  Her numbness/tingling is always worse in the evening and she has started seeing Pain Management.  She takes tylenol #3  at night which helps her rest at night.  She also takes nortriptyline 69m and tizanidine 455m  No new complaints.   UPDATE 12/01/2015:  She has been eating much better and has gained weight since her last visit. Her strength continues to improve and her pain is not as severe as before, now mostly restricted to the heels and over the Achilles tendons.  At her last visit, thiamine level was rechecked and remained low so she was instructed to take 20058maily.  No new complaints.    Medications:  Current Outpatient Prescriptions on File Prior to Visit  Medication Sig Dispense Refill  . acetaminophen-codeine (TYLENOL #3) 300-30 MG tablet Take 1 tablet by mouth 2 (two) times daily as needed for moderate pain. 60 tablet 1  . meclizine (ANTIVERT) 32 MG tablet Take 1 tablet (32 mg total) by mouth 3 (three) times daily. For 3 days scheduled then as needed thereafter (Patient not taking: Reported on 09/14/2015) 60 tablet 1  . nortriptyline (PAMELOR) 50 MG capsule Take 1 capsule (50 mg total) by mouth at bedtime. (Patient not taking: Reported on 09/14/2015) 30 capsule 2  . omeprazole (PRILOSEC) 40 MG capsule Take 1 capsule (40 mg total) by mouth daily. (Patient not taking: Reported on 09/14/2015) 60 capsule 1  . ondansetron (ZOFRAN ODT) 4 MG disintegrating tablet 4mg24mT q4 hours prn nausea/vomit (Patient taking differently: Take 4 mg by mouth every 4 (four) hours as needed for nausea. ) 20 tablet 0  . tiZANidine (ZANAFLEX) 4 MG tablet Take 1 tablet (4 mg total) by mouth every 6 (six) hours as needed for muscle spasms. 75 tablet 3   No current facility-administered medications on file prior to visit.     Allergies:  Allergies  Allergen Reactions  . Lavender Oil Rash    Review of Systems:  CONSTITUTIONAL: No fevers, chills, night sweats, or weight loss.  EYES: No visual changes or eye pain ENT: No hearing changes.  No history of nose bleeds.   RESPIRATORY: No cough, wheezing and shortness of breath.     CARDIOVASCULAR: Negative for chest pain, and palpitations.   GI: Negative for abdominal discomfort, blood in stools or black stools.  No recent change in bowel habits.   GU:  No history of incontinence.   MUSCLOSKELETAL: No history of joint pain or swelling.  No myalgias.   SKIN: Negative for lesions, rash, and itching.   ENDOCRINE: Negative for cold or heat intolerance, polydipsia or goiter.   PSYCH:  + depression or anxiety symptoms.   NEURO: As Above.   Vital Signs:  BP 120/90   Pulse (!) 127   Ht '5\' 2"'  (1.575 m)   Wt 136 lb 6 oz (61.9 kg)   SpO2 98%   BMI 24.94 kg/m   Neurological Exam: MENTAL STATUS including orientation to time, place, person, recent and remote memory, attention span and concentration, language, and fund of knowledge is normal.  Speech is not dysarthric.  CRANIAL NERVES: Pupils equal round and reactive to light.  Normal conjugate, extra-ocular eye movements in all directions of gaze.  No ptosis. Normal facial sensation.  Face is symmetric. Palate elevates symmetrically.  Tongue is midline.  MOTOR:  No atrophy, fasciculations or  abnormal movements.  No pronator drift.  Tone is normal.    Right Upper Extremity:    Left Upper Extremity:    Deltoid  5/5   Deltoid  5/5   Biceps  5/5   Biceps  5/5   Triceps  5/5   Triceps  5/5   Wrist extensors  5/5   Wrist extensors  5/5   Wrist flexors  5/5   Wrist flexors  5/5   Finger extensors *improved 5/5   Finger extensors *improved 5/5   Finger flexors *improved 5/5   Finger flexors *improved 5/5   Dorsal interossei  5-/5   Dorsal interossei  5-/5   Abductor pollicis  5/5   Abductor pollicis  5/5  Tone (Ashworth scale)  0  Tone (Ashworth scale)  0   Right Lower Extremity:    Left Lower Extremity:    Hip flexors  5/5   Hip flexors  5/5   Hip extensors  5/5   Hip extensors  5/5   Knee flexors  5/5   Knee flexors *improved 5/5   Knee extensors  5/5   Knee extensors  5/5   Dorsiflexors  5/5   Dorsiflexors  5/5    Plantarflexors  5/5   Plantarflexors  5/5   Toe extensors  5/5   Toe extensors  5/5   Toe flexors *improved 5/5   Toe flexors *improved 5/5   Tone (Ashworth scale)  0  Tone (Ashworth scale)  0   MSRs:  Reflexes are 2+/4 in the upper extremities and absent in the lower extremities.  SENSORY:  Vibration is 80% at ankles and present at the great toe.  Temperature reduced distal to mid-calf bilaterally.  Sensation is preserved in the upper extremities.    COORDINATION/GAIT:  Normal appearing and stable gait.  She is able to walk on heels and toes (improved). Tandem gait is unsteady.  Rhomberg sign is mildly positive.   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 Overall, these findings are severe in degree electrically. Incidentally, there is a left Martin-Gruber anastomosis, a normal variant.  Labs 05/27/2015:  CK 42, RF neg, CRP 5.0*, vitamin B6 6.8, ANA neg, vitamin D 27.4*, HbA1c 4.9, heavy metal screen, thiamine 40.6* Labs 03/03/2015:  Vitamin B12 223 Labs 12/01/2015:  Folate 8.8, vitamin B1 <7, vitamin B12 >1500  NCS/EMG 05/29/2015: Abnormal study demonstrate: 1. 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   2. Normal nerve conduction studies. 3. Given the clinical context considerations would include cervical and lumbar polyradiculopathies vs improving/recovering polyneuropathy.  MRI lumbar spine wwo contrast 06/23/2015: This MRI of the lumbar spine with and without contrast shows the following: 1.   Spinal cord appears normal. 2.   Mild disc  bulging at L4-L5 that does not lead to any nerve root impingement. 3.   There is a normal enhancement pattern.  IMPRESSION: Ms. Whalin is a 30 year-old female returning for evaluation of subacute and progressive distal painful paresthesias, gait imbalance, and weakness due to nutrient deficiency and malnutrition.  Electrodiagnostic testing showed subacute  generalized sensory polyneuropathy, axon loss in type.  Serology testing has been notable for low vitamin B1, vitamin B12, and folate levels which are being supplemented.  CSF testing was normal.    Over the past 5 months, she has continued to show improvement in her motor strength and now only has subtle hand weakness. She is able to walk on heels and toes which is new.  She remains arreflexic in the legs. Gait appears much more stable and she is walking independently.   I encouraged her to continue to maintain a healthy well-balanced diet. She is also starting an exercise program.   PLAN/RECOMMENDATIONS:  1.  Continue thiamine 543KG daily and folic 28m daily 2.  Continue vitamin B12 10018m monthly (administered today)    3.  Check thiamine and folate  Return to clinic in 3 month  The duration of this appointment visit was 30 minutes of face-to-face time with the patient.  Greater than 50% of this time was spent in counseling, explanation of diagnosis, planning of further management, and coordination of care.   Thank you for allowing me to participate in patient's care.  If I can answer any additional questions, I would be pleased to do so.    Sincerely,    Elyon Zoll K. PaPosey ProntoDO

## 2015-12-05 LAB — VITAMIN B1

## 2016-01-15 ENCOUNTER — Ambulatory Visit (INDEPENDENT_AMBULATORY_CARE_PROVIDER_SITE_OTHER): Payer: Medicaid Other | Admitting: *Deleted

## 2016-01-15 DIAGNOSIS — E538 Deficiency of other specified B group vitamins: Secondary | ICD-10-CM

## 2016-01-15 MED ORDER — CYANOCOBALAMIN 1000 MCG/ML IJ SOLN
1000.0000 ug | Freq: Once | INTRAMUSCULAR | Status: AC
  Administered 2016-01-15: 1000 ug via INTRAMUSCULAR

## 2016-02-19 ENCOUNTER — Ambulatory Visit (INDEPENDENT_AMBULATORY_CARE_PROVIDER_SITE_OTHER): Payer: Medicaid Other | Admitting: *Deleted

## 2016-02-19 DIAGNOSIS — E538 Deficiency of other specified B group vitamins: Secondary | ICD-10-CM | POA: Diagnosis not present

## 2016-02-19 MED ORDER — CYANOCOBALAMIN 1000 MCG/ML IJ SOLN
1000.0000 ug | Freq: Once | INTRAMUSCULAR | Status: AC
  Administered 2016-02-19: 1000 ug via INTRAMUSCULAR

## 2016-04-08 ENCOUNTER — Ambulatory Visit (INDEPENDENT_AMBULATORY_CARE_PROVIDER_SITE_OTHER): Payer: Medicaid Other

## 2016-04-08 DIAGNOSIS — E538 Deficiency of other specified B group vitamins: Secondary | ICD-10-CM | POA: Diagnosis not present

## 2016-04-08 MED ORDER — CYANOCOBALAMIN 1000 MCG/ML IJ SOLN
1000.0000 ug | Freq: Once | INTRAMUSCULAR | Status: AC
  Administered 2016-04-08: 1000 ug via INTRAMUSCULAR

## 2016-06-03 ENCOUNTER — Encounter: Payer: Self-pay | Admitting: Neurology

## 2016-06-03 ENCOUNTER — Ambulatory Visit (INDEPENDENT_AMBULATORY_CARE_PROVIDER_SITE_OTHER): Payer: Medicaid Other | Admitting: Neurology

## 2016-06-03 VITALS — BP 100/70 | HR 122 | Ht 62.0 in | Wt 177.2 lb

## 2016-06-03 DIAGNOSIS — E538 Deficiency of other specified B group vitamins: Secondary | ICD-10-CM

## 2016-06-03 DIAGNOSIS — E5111 Dry beriberi: Secondary | ICD-10-CM | POA: Diagnosis not present

## 2016-06-03 DIAGNOSIS — G63 Polyneuropathy in diseases classified elsewhere: Secondary | ICD-10-CM

## 2016-06-03 NOTE — Patient Instructions (Addendum)
1.  Stop folic acid 2.  Continue thiamine 100mg  daily 3.  Continue B12 1000mcg daily 4.  Start multivitamin   Return to clinic as needed

## 2016-06-03 NOTE — Progress Notes (Signed)
Follow-up Visit   Date: 06/03/16    Laura Montoya MRN: [id number] DOB: [birth year]   Interim History: Laura Montoya is a 31 y.o. 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    History of present illness: Around the end of October 2016, she began experiencing gradual onset of electrical-like pain involving the lateral sole and ankles and especially worse left. Symptoms progressively worsened over the next few months and by December, she was having the painful tingling involving her hands and she started having numbness involving her lower legs. She endorses weakness of the hands and legs and has noticed difficulty opening jars and bottles. In November, she suffered a fall because of imbalance and has been using a walker since December. Prolonged walking exacerbates pain. She was briefly on gabapentin which helped with pain, but it was stopped due to leg swelling. She does not recall any preceding events, except having a flu shot around the same time. No recent travel or insect bites. In late December, she developed vomiting and abdominal pain and was hospitalized for viral gastroenteritis. She was managed symptomatically with hydration and electrolyte repletion. Since January, she has not noticed any progress worsening of symptoms and feels as if she's "hit a plateau".   She began seeing Dr. Rexene Alberts in early February for her ongoing neurological symptoms and had electrodiagnostic testing, serology testing, and imaging of her lumbar spine which was reviewed. NCS showed normal bilateral median and ulnar sensory and motor responses. Left sural as well as peroneal and tibial motor responses was normal. Late responses were also normal. Needle electrode examination showed active denervation in bilateral medial gastrocnemius muscles  and the right FCR. Paraspinal muscles appeared normal. Other muscles involving the S1 and C6/C7-myotomes were not studied. Labs showed low vitamin B12, vitmain B1, and vitamin D levels. She is not vegan or vegetarian, but only eats one meal a day. She was recommended to start OTC supplements, but due to cost, did not start this.   No history of heavy alcohol use. She occasionally has a wine cooler, about once per month.   No family history of neuromuscular disease, including neuropathy.   UPDATE 07/13/2015:  Patient is here to discuss her labs and electrodiagnostic testing.   Lab testing shows nutrient deficiency in vitamin B12, vitamin B1, and folate.  She endorses trying to eat 2 meals a day, but since the birth of her last child, has always eaten only one meal daily.  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  Demyelinating changes were not present, and she did have active denervation as previously noted. She feels that symptoms have plateaued and there has been no improvement or worsening.  She walks with a walker and had one fall last week.   UPDATE 08/12/2015:  Since her last visit, she underwent CSF testing which returned normal.  She has made an active effort to increase PO intake and has been compliant with taking her supplements (folic acid, vitamin B1, and vitamin B12) and has noticed a marked improvement in her strength and overall energy.  She is eating 3-4 oz of protein and 2-3 cups of fruits and vegetables.  She is still only eating two meals per day and snacks throughout the day.  Her balance has improved and now walks with cane.  She had not had any interval falls.  Although her hand strength has improved, she still has difficulty with opening bottles and jars.  Her numbness/tingling is always worse in the evening and she has started seeing Pain Management.  She takes tylenol #3 at night which helps her rest at night.  She also takes  nortriptyline 83m and tizanidine 416m  No new complaints.   UPDATE 12/01/2015:  She has been eating much better and has gained weight since her last visit. Her strength continues to improve and her pain is not as severe as before, now mostly restricted to the heels and over the Achilles tendons.  At her last visit, thiamine level was rechecked and remained low so she was instructed to take 20079maily.  No new complaints.    UPDATE 06/03/2016:  She is here with her mother and daughter.  From a neuropathy standpoint, she is doing well.  She has no longer has paresthesias of the hands and only has tingling of the feet.  Her legs still feel a little weak, but she staying active and trying to exercise.  She is now taking folate, thiamine, and vitamin B12 supplements my mouth.    Unfortunately, over the past several months, she has gained 40lb and attributes this to a new medication for schizophrenia. This medication was stopped in January and she has been trying very hard to exercise and reduced her food intake.  She is eating at least two meals daily and mother states she is choosing healthy options.    Medications:  Current Outpatient Prescriptions on File Prior to Visit  Medication Sig Dispense Refill  . acetaminophen-codeine (TYLENOL #3) 300-30 MG tablet Take 1 tablet by mouth 2 (two) times daily as needed for moderate pain. 60 tablet 1  . ARIPiprazole (ABILIFY) 5 MG tablet Take 5 mg by mouth daily.  2  . meclizine (ANTIVERT) 32 MG tablet Take 1 tablet (32 mg total) by mouth 3 (three) times daily. For 3 days scheduled then as needed thereafter (Patient not taking: Reported on 09/14/2015) 60 tablet 1  . nortriptyline (PAMELOR) 50 MG capsule Take 1 capsule (50 mg total) by mouth at bedtime. (Patient not taking: Reported on 09/14/2015) 30 capsule 2  . OLANZapine (ZYPREXA) 5 MG tablet Take 5 mg by mouth at bedtime.  0  . omeprazole (PRILOSEC) 40 MG capsule Take 1 capsule (40 mg total) by mouth daily. (Patient  not taking: Reported on 09/14/2015) 60 capsule 1  . ondansetron (ZOFRAN ODT) 4 MG disintegrating tablet 4mg80mT q4 hours prn nausea/vomit (Patient taking differently: Take 4 mg by mouth every 4 (four) hours as needed for nausea. ) 20 tablet 0  . tiZANidine (ZANAFLEX) 4 MG tablet Take 1 tablet (4 mg total) by mouth every 6 (six) hours as needed for muscle spasms. 75 tablet 3   No current facility-administered medications on file prior to visit.     Allergies:  Allergies  Allergen Reactions  . Lavender Oil Rash    Review of Systems:  CONSTITUTIONAL: No fevers, chills, night sweats, or weight loss.  EYES: No visual changes or eye pain ENT: No hearing changes.  No history of nose bleeds.   RESPIRATORY: No cough, wheezing and shortness of breath.   CARDIOVASCULAR: Negative for chest pain, and palpitations.   GI: Negative for abdominal discomfort, blood in stools or black stools.  No recent change in bowel habits.   GU:  No history of incontinence.   MUSCLOSKELETAL: No history of joint pain or swelling.  No myalgias.  SKIN: Negative for lesions, rash, and itching.   ENDOCRINE: Negative for cold or heat intolerance, polydipsia or goiter.   PSYCH:  + depression or anxiety symptoms.   NEURO: As Above.   Vital Signs:  BP 100/70   Pulse (!) 122   Ht '5\' 2"'  (1.575 m)   Wt 177 lb 3 oz (80.4 kg)   SpO2 97%   BMI 32.41 kg/m   Neurological Exam: MENTAL STATUS including orientation to time, place, person, recent and remote memory, attention span and concentration, language, and fund of knowledge is normal.  Speech is not dysarthric.  CRANIAL NERVES: Pupils equal round and reactive to light.  Normal conjugate, extra-ocular eye movements in all directions of gaze.  No ptosis. Normal facial sensation.  Face is symmetric. Palate elevates symmetrically.  Tongue is midline.  MOTOR:  No atrophy, fasciculations or abnormal movements.  No pronator drift.  Tone is normal.    Right Upper Extremity:     Left Upper Extremity:    Deltoid  5/5   Deltoid  5/5   Biceps  5/5   Biceps  5/5   Triceps  5/5   Triceps  5/5   Wrist extensors  5/5   Wrist extensors  5/5   Wrist flexors  5/5   Wrist flexors  5/5   Finger extensors  5/5   Finger extensors 5/5   Finger flexors  5/5   Finger flexor 5/5   Dorsal interossei *improved 5/5   Dorsal interossei *improved 5/5   Abductor pollicis  5/5   Abductor pollicis  5/5  Tone (Ashworth scale)  0  Tone (Ashworth scale)  0   Right Lower Extremity:    Left Lower Extremity:    Hip flexors  5/5   Hip flexors  5/5   Hip extensors  5/5   Hip extensors  5/5   Knee flexors  5/5   Knee flexors *improved 5/5   Knee extensors  5/5   Knee extensors  5/5   Dorsiflexors  5/5   Dorsiflexors  5/5   Plantarflexors  5/5   Plantarflexors  5/5   Toe extensors  5/5   Toe extensors  5/5   Toe flexors *improved 5/5   Toe flexors *improved 5/5   Tone (Ashworth scale)  0  Tone (Ashworth scale)  0   MSRs:  Reflexes are 2+/4 in the upper extremities and absent in the lower extremities.  SENSORY:  Vibration is 80% at ankles and absent at the great toe.  Temperature reduced distal to mid-calf bilaterally.  Sensation is preserved in the upper extremities.    COORDINATION/GAIT:  Normal appearing and stable gait.  She is able to walk on heels and toes (improved). Tandem gait is unsteady.  Rhomberg sign is mildly positive.   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 Overall, these findings are severe in degree electrically. Incidentally, there is a left Martin-Gruber anastomosis, a normal variant.  Labs 05/27/2015:  CK  42, RF neg, CRP 5.0*, vitamin B6 6.8, ANA neg, vitamin D 27.4*, HbA1c 4.9, heavy metal screen, thiamine 40.6* Labs 03/03/2015:  Vitamin B12  223 Labs 12/01/2015:  Folate 8.8, vitamin B1 <7, vitamin B12 >1500  NCS/EMG 05/29/2015: Abnormal study demonstrate: 1. 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   2. Normal nerve conduction studies. 3. Given the clinical context considerations would include cervical and lumbar polyradiculopathies vs improving/recovering polyneuropathy.  MRI lumbar spine wwo contrast 06/23/2015: This MRI of the lumbar spine with and without contrast shows the following: 1.   Spinal cord appears normal. 2.   Mild disc bulging at L4-L5 that does not lead to any nerve root impingement. 3.   There is a normal enhancement pattern.  IMPRESSION: Ms. Rogoff is a 31 year-old female returning for evaluation of subacute and progressive distal painful paresthesias, gait imbalance, and weakness due to nutrient deficiency and malnutrition (2016).  Electrodiagnostic testing showed subacute generalized sensory polyneuropathy, axon loss in type.  Serology testing has been notable for low vitamin B1, vitamin B12, and folate levels which are being supplemented.  CSF testing was normal.    Over the past year, she has continued to show improvement in her motor strength and now only has distal feet paresthesias.  There is no weakness. She is able to walk on heels and toes which is new.  She remains arreflexic in the legs. Gait appears much more stable.  She has unsteadiness with tandem gait, but even the severity of this had improved.  With her recent weight gain, she is trying to be mindful of her caloric intake.  I emphasized the importance of eating healthy meals and avoiding skipping meals.  She will continue thiamine 132m and vitamin B12 10014m daily.  OK to stop folate and recommend starting a daily  multivitamin.  Return to clinic as needed  The duration of this appointment visit was 20 minutes of face-to-face time with the patient.  Greater than 50% of this time was spent in counseling, explanation of diagnosis, planning of further management, and coordination of care.   Thank you for allowing me to participate in patient's care.  If I can answer any additional questions, I would be pleased to do so.    Sincerely,    Lathyn Griggs K. PaPosey ProntoDO

## 2016-07-21 ENCOUNTER — Encounter (HOSPITAL_COMMUNITY): Payer: Self-pay

## 2016-07-21 ENCOUNTER — Emergency Department (HOSPITAL_COMMUNITY): Payer: Medicaid Other

## 2016-07-21 ENCOUNTER — Emergency Department (HOSPITAL_COMMUNITY)
Admission: EM | Admit: 2016-07-21 | Discharge: 2016-07-21 | Disposition: A | Discharge status: Home / Self Care | Payer: Medicaid Other | Attending: Emergency Medicine | Admitting: Emergency Medicine

## 2016-07-21 DIAGNOSIS — J45909 Unspecified asthma, uncomplicated: Secondary | ICD-10-CM | POA: Diagnosis not present

## 2016-07-21 DIAGNOSIS — R1013 Epigastric pain: Secondary | ICD-10-CM | POA: Diagnosis present

## 2016-07-21 DIAGNOSIS — N3000 Acute cystitis without hematuria: Secondary | ICD-10-CM | POA: Insufficient documentation

## 2016-07-21 DIAGNOSIS — Z79899 Other long term (current) drug therapy: Secondary | ICD-10-CM | POA: Insufficient documentation

## 2016-07-21 DIAGNOSIS — F17210 Nicotine dependence, cigarettes, uncomplicated: Secondary | ICD-10-CM | POA: Diagnosis not present

## 2016-07-21 DIAGNOSIS — E876 Hypokalemia: Secondary | ICD-10-CM

## 2016-07-21 DIAGNOSIS — R109 Unspecified abdominal pain: Secondary | ICD-10-CM

## 2016-07-21 LAB — URINALYSIS, ROUTINE W REFLEX MICROSCOPIC
BILIRUBIN URINE: NEGATIVE
Glucose, UA: NEGATIVE mg/dL
Hgb urine dipstick: NEGATIVE
KETONES UR: NEGATIVE mg/dL
Nitrite: NEGATIVE
PH: 5 (ref 5.0–8.0)
Protein, ur: NEGATIVE mg/dL
Specific Gravity, Urine: 1.016 (ref 1.005–1.030)

## 2016-07-21 LAB — CBC
HCT: 39.6 % (ref 36.0–46.0)
Hemoglobin: 13.9 g/dL (ref 12.0–15.0)
MCH: 32.3 pg (ref 26.0–34.0)
MCHC: 35.1 g/dL (ref 30.0–36.0)
MCV: 91.9 fL (ref 78.0–100.0)
Platelets: 426 10*3/uL — ABNORMAL HIGH (ref 150–400)
RBC: 4.31 MIL/uL (ref 3.87–5.11)
RDW: 12.5 % (ref 11.5–15.5)
WBC: 18.8 10*3/uL — ABNORMAL HIGH (ref 4.0–10.5)

## 2016-07-21 LAB — COMPREHENSIVE METABOLIC PANEL
ALBUMIN: 3.5 g/dL (ref 3.5–5.0)
ALK PHOS: 119 U/L (ref 38–126)
ALT: 13 U/L — AB (ref 14–54)
AST: 20 U/L (ref 15–41)
Anion gap: 14 (ref 5–15)
BUN: <5 mg/dL — ABNORMAL LOW (ref 6–20)
CALCIUM: 9.2 mg/dL (ref 8.9–10.3)
CHLORIDE: 101 mmol/L (ref 101–111)
CO2: 23 mmol/L (ref 22–32)
CREATININE: 0.71 mg/dL (ref 0.44–1.00)
GFR calc Af Amer: >60 mL/min (ref >60)
GFR calc non Af Amer: >60 mL/min (ref >60)
GLUCOSE: 135 mg/dL — AB (ref 65–99)
Potassium: 2.9 mmol/L — ABNORMAL LOW (ref 3.5–5.1)
SODIUM: 138 mmol/L (ref 135–145)
Total Bilirubin: 0.5 mg/dL (ref 0.3–1.2)
Total Protein: 6.6 g/dL (ref 6.5–8.1)

## 2016-07-21 LAB — I-STAT BETA HCG BLOOD, ED (MC, WL, AP ONLY)

## 2016-07-21 LAB — LIPASE, BLOOD

## 2016-07-21 MED ORDER — POTASSIUM CHLORIDE ER 10 MEQ PO TBCR
40.0000 meq | EXTENDED_RELEASE_TABLET | Freq: Two times a day (BID) | ORAL | 0 refills | Status: DC
Start: 2016-07-21 — End: 2016-07-24

## 2016-07-21 MED ORDER — LORAZEPAM 1 MG PO TABS
1.0000 mg | ORAL_TABLET | Freq: Once | ORAL | Status: AC
Start: 2016-07-21 — End: 2016-07-21
  Administered 2016-07-21: 1 mg via ORAL
  Filled 2016-07-21: qty 1

## 2016-07-21 MED ORDER — GI COCKTAIL ~~LOC~~
30.0000 mL | Freq: Once | ORAL | Status: AC
  Administered 2016-07-21: 30 mL via ORAL
  Filled 2016-07-21: qty 30

## 2016-07-21 MED ORDER — LORAZEPAM 2 MG/ML IJ SOLN
1.0000 mg | Freq: Once | INTRAMUSCULAR | Status: AC
  Administered 2016-07-21: 1 mg via INTRAVENOUS
  Filled 2016-07-21: qty 1

## 2016-07-21 MED ORDER — PANTOPRAZOLE SODIUM 20 MG PO TBEC: 20.0000 mg | DELAYED_RELEASE_TABLET | Freq: Every day | ORAL | 0 refills | Status: DC

## 2016-07-21 MED ORDER — ONDANSETRON HCL 4 MG/2ML IJ SOLN
4.0000 mg | Freq: Once | INTRAMUSCULAR | Status: AC
  Administered 2016-07-21: 4 mg via INTRAVENOUS
  Filled 2016-07-21: qty 2

## 2016-07-21 MED ORDER — CEPHALEXIN 500 MG PO CAPS: 500.0000 mg | ORAL_CAPSULE | Freq: Two times a day (BID) | ORAL | 0 refills | Status: AC

## 2016-07-21 NOTE — ED Notes (Signed)
Patient sipped water with meds and immediately threw it up

## 2016-07-21 NOTE — ED Notes (Signed)
Papers and prescriptions reviewed with patient and she verbalizes understanding. IV removed, leaving with family today

## 2016-07-21 NOTE — ED Provider Notes (Signed)
[facility]-EMERGENCY DEPT Provider Note   CSN: [id number] Arrival date & time: 07/21/16  1050     History   Chief Complaint Chief Complaint  Patient presents with  . Abdominal Pain    HPI Laura Montoya is a 31 y.o. female with PMHx of anxiety, asthma, mood disorder, schizophrenia, GERD, gastritis presents today with complaints of midepigastric pain since yesterday morning. She reports her pain as worsening, constant, crampy, achy 8/10 pain. She reports associated nausea, vomiting, diarrhea, chest pain, shortness of breath, and productive cough. She has tried zofran, dramamine, and tylenol with no relief. She states eating makes her symptoms worse. Nothing makes her symptoms better. She denies fever, chills. She reports recent sinus and ear infection and placed on antibiotics one week ago. She denies hx of abdominal surgeries. She denies alcohol use. She states she has had GERD and gastritis in the past and has had an endoscopy done in the past. She reports her symptoms are similar to her previous GERD/gastritis. She reports having prilosec at home which she takes as needed. She reports feeling very anxious at this time due to her symptoms and not being able to tolerate her buspar this morning that she normally takes.   The history is provided by the patient. No language interpreter was used.  Abdominal Pain   Associated symptoms include diarrhea, nausea and vomiting. Pertinent negatives include fever, dysuria and hematuria.    Past Medical History:  Diagnosis Date  . Anxiety   . Asthma   . Dizziness   . Dysmenorrhea   . Headache   . Mood disorder (HCC)   . Schizophrenia (HCC)   . Scoliosis   . Thrush     Patient Active Problem List   Diagnosis Date Noted  . Prolonged QT interval 09/14/2015  . Hypomagnesemia 09/14/2015  . Hypokalemia 09/14/2015  . Vomiting and diarrhea 09/14/2015  . Tobacco use disorder 09/14/2015  . 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 Vitamin B12  deficiency 08/12/2015  . 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 Folate deficiency 08/12/2015  . Schizophrenia (HCC) 04/30/2015  . Benign paroxysmal positional vertigo 03/04/2015  . Cervicalgia 05/06/2013  . Myofascial muscle pain 02/06/2013  . Sacroiliitis (HCC) 07/09/2012  . Idiopathic scoliosis 06/12/2012  . Lumbar spondylosis with myelopathy 06/12/2012  . Osteopenia 06/12/2012    Past Surgical History:  Procedure Laterality Date  . BREAST SURGERY    . MOUTH SURGERY      OB History    No data available       Home Medications    Prior to Admission medications   Medication Sig Start Date End Date Taking? Authorizing Provider  acetaminophen-codeine (TYLENOL #3) 300-30 MG tablet Take 1 tablet by mouth 2 (two) times daily as needed for moderate pain. 08/03/15   Ranelle OysterZachary T Swartz, MD  ARIPiprazole (ABILIFY) 5 MG tablet Take 5 mg by mouth daily. 09/13/15   Historical Provider, MD  cephALEXin (KEFLEX) 500 MG capsule Take 1 capsule (500 mg total) by mouth 2 (two) times daily. 07/21/16 07/26/16  Mychal Durio Manuel Britt[place]sp[place], [place]  meclizine (ANTIVERT) 32 MG tablet Take 1 tablet (32 mg total) by mouth 3 (three) times daily. For 3 days scheduled then as needed thereafter 03/04/15   Ranelle OysterZachary T Swartz, MD  nortriptyline (PAMELOR) 50 MG capsule Take 1 capsule (50 mg total) by mouth at bedtime. 06/08/15   Jones BalesEunice L Thomas, NP  omeprazole (PRILOSEC) 40 MG capsule Take 1 capsule (40 mg total) by mouth daily. 05/08/15  Ruffin Frederick, MD  ondansetron (ZOFRAN ODT) 4 MG disintegrating tablet 4mg  ODT q4 hours prn nausea/vomit Patient taking differently: Take 4 mg by mouth every 4 (four) hours as needed for nausea.  03/02/15   Joycie Peek, PA-C  pantoprazole (PROTONIX) 20 MG tablet Take 1 tablet (20 mg total) by mouth daily. 07/21/16   Loyce Klasen Manuel Lavelle, [place]  potassium chloride (K-DUR) 10 MEQ tablet Take 4 tablets (40 mEq total) by mouth 2 (two) times daily. 07/21/16 07/22/16   Osborn Pullin Manuel Aloys Hupfer, PA  tiZANidine (ZANAFLEX) 4 MG tablet Take 1 tablet (4 mg total) by mouth every 6 (six) hours as needed for muscle spasms. 08/03/15   Ranelle Oyster, MD  VRAYLAR 3 MG CAPS Take 1 capsule by mouth daily. 05/02/16   Historical Provider, MD    Family History Family History  Problem Relation Age of Onset  . Thyroid disease Mother   . Asthma Mother   . Mental illness Father   . Hyperlipidemia Father   . Healthy Brother   . Healthy Daughter   . Heart disease Paternal Grandfather   . Heart disease Paternal Grandmother     Social History Social History  Substance Use Topics  . Smoking status: Current Every Day Smoker    Packs/day: 0.25    Years: 16.00    Types: Cigarettes  . Smokeless tobacco: Never Used  . Alcohol use 0.0 oz/week     Comment: One wine cooler per month     Allergies   Lavender oil   Review of Systems Review of Systems  Constitutional: Negative for fever.  Respiratory: Positive for cough and shortness of breath.   Cardiovascular: Positive for chest pain.  Gastrointestinal: Positive for abdominal pain, diarrhea, nausea and vomiting. Negative for blood in stool.  Genitourinary: Negative for difficulty urinating, dysuria and hematuria.  Musculoskeletal: Negative for neck pain and neck stiffness.  Skin: Negative for rash and wound.  All other systems reviewed and are negative.    Physical Exam Updated Vital Signs BP 116/61   Pulse 78   Temp 97.6 F (36.4 C) (Oral)   Resp 16   Ht 5\' 2"  (1.575 m)   Wt 77.1 kg   LMP 06/01/2016   SpO2 98%   BMI 31.09 kg/m   Physical Exam  Constitutional: She appears well-developed and well-nourished.  Well appearing  HENT:  Head: Normocephalic and atraumatic.  Nose: Nose normal.  Mouth/Throat: Oropharynx is clear and moist.  Eyes: Conjunctivae and EOM are normal.  Neck: Normal range of motion.  Cardiovascular: Normal rate, normal heart sounds and intact distal pulses.   No murmur  heard. Pulmonary/Chest: Effort normal and breath sounds normal. No respiratory distress. She has no wheezes. She has no rales.  Normal work of breathing. No respiratory distress noted.   Abdominal: Soft. Bowel sounds are normal. There is tenderness. There is guarding. There is no rebound.  Soft. Maximally tender to midepigastric region with mild guarding. No rebound tenderness. No CVA tenderness. Negative murphy's sign. No focal tenderness to Mcburney's point.   Musculoskeletal: Normal range of motion.  Neurological: She is alert.  Skin: Skin is warm.  Psychiatric: She has a normal mood and affect. Her behavior is normal.  Agitated and anxious during exam.   Nursing note and vitals reviewed.    ED Treatments / Results  Labs (all labs ordered are listed, but only abnormal results are displayed) Labs Reviewed  LIPASE, BLOOD - Abnormal; Notable for the following:  Result Value   Lipase <10 (*)    All other components within normal limits  COMPREHENSIVE METABOLIC PANEL - Abnormal; Notable for the following:    Potassium 2.9 (*)    Glucose, Bld 135 (*)    BUN <5 (*)    ALT 13 (*)    All other components within normal limits  CBC - Abnormal; Notable for the following:    WBC 18.8 (*)    Platelets 426 (*)    All other components within normal limits  URINALYSIS, ROUTINE W REFLEX MICROSCOPIC - Abnormal; Notable for the following:    APPearance CLOUDY (*)    Leukocytes, UA TRACE (*)    Bacteria, UA RARE (*)    Squamous Epithelial / LPF 6-30 (*)    All other components within normal limits  URINE CULTURE  I-STAT BETA HCG BLOOD, ED ([facility], [facility], [facility] ONLY)    EKG  EKG Interpretation None       Radiology Dg Chest 2 View  Result Date: 07/21/2016 CLINICAL DATA:  Cough, abdominal pain for 2-3 days. Intermittent vomiting for 2 days. EXAM: CHEST  2 VIEW COMPARISON:  Chest radiograph Sep 14, 2015 FINDINGS: Cardiomediastinal silhouette is normal. No pleural effusions or focal  consolidations. Trachea projects midline and there is no pneumothorax. Soft tissue planes and included osseous structures are non-suspicious. IMPRESSION: Normal chest. Electronically Signed   By: Awilda Metro M.D.   On: 07/21/2016 18:09   US Abdomen Limited Ruq  Result Date: 07/21/2016 CLINICAL DATA:  Right upper quadrant pain. EXAM: US ABDOMEN LIMITED - RIGHT UPPER QUADRANT COMPARISON:  09/14/2015.  CT 04/28/2015. FINDINGS: Gallbladder: No gallstones or wall thickening visualized. No sonographic Murphy sign noted by sonographer. Common bile duct: Diameter: 6.0 mm Liver: No focal lesion identified. Within normal limits in parenchymal echogenicity. IMPRESSION: Negative exam. Electronically Signed   By: Maisie Fus  Register   On: 07/21/2016 16:52    Procedures Procedures (including critical care time)  Medications Ordered in ED Medications  LORazepam (ATIVAN) tablet 1 mg (1 mg Oral Given 07/21/16 1541)  gi cocktail (Maalox,Lidocaine,Donnatal) (30 mLs Oral Given 07/21/16 1541)  ondansetron (ZOFRAN) injection 4 mg (4 mg Intravenous Given 07/21/16 1725)  LORazepam (ATIVAN) injection 1 mg (1 mg Intravenous Given 07/21/16 1728)     Initial Impression / Assessment and Plan / ED Course  I have reviewed the triage vital signs and the nursing notes.  Pertinent labs & imaging results that were available during my care of the patient were reviewed by me and considered in my medical decision making (see chart for details).    Patient with symptoms consistent with gastritis.  Vitals are stable, no fever or tachycardia.  Patient is nontoxic, nonseptic appearing, in no apparent distress.  Patient does not meet the SIRS or Sepsis criteria.  Pt's symptoms have been managed in the department. Tolerating PO fluids > 6 oz.  Lungs are clear.  No focal abdominal pain, no peritoneal signs, no concern for appendicitis, cholecystitis, pancreatitis, ruptured viscus, kidney stone, PID, ectopic pregnancy. Right upper  quadrant ultrasound negative for any biliary pathology. Patient had trace leukocytes, UA and possible UTI. We'll give antibiotics to go home with. Urine culture sent. Patient also has nonspecific leukocytosis. This could be from possible UTI or dehydration. Patient had a potassium level of 2.9. Patient given potassium to take tomorrow and instructions to follow up with primary care provider to have a repeat lab work done.  Patient seemed anxious during exam. She states she has history  of similar and a history of schizophrenia. She states she normally takes BuSpar and did not take her medication today. Patient given Ativan with relief.  Supportive therapy indicated. On reassessment patient was found to be asleep with no vomiting since Zofran and Ativan administration. As well as inability to maintain fluids. Patient counseled, expresses understanding and agrees with plan. I discussed that since she has history of prolonged QT syndrome that I am not comfortable with giving her Zofran to go home with, and to check with her primary care provider regarding that. She verbally understood.  Patient also seen and evaluated by Dr. Rosalia Hammers who agrees with assessment and plan.    Final Clinical Impressions(s) / ED Diagnoses   Final diagnoses:  Abdominal pain  Hypokalemia  Acute cystitis without hematuria    New Prescriptions Discharge Medication List as of 07/21/2016  6:25 PM    START taking these medications   Details  cephALEXin (KEFLEX) 500 MG capsule Take 1 capsule (500 mg total) by mouth 2 (two) times daily., Starting Thu 07/21/2016, Until Tue 07/26/2016, Print    pantoprazole (PROTONIX) 20 MG tablet Take 1 tablet (20 mg total) by mouth daily., Starting Thu 07/21/2016, Print    potassium chloride (K-DUR) 10 MEQ tablet Take 4 tablets (40 mEq total) by mouth 2 (two) times daily., Starting Thu 07/21/2016, Until Fri 07/22/2016, Print         [address] [place], [place] 07/22/16 1610    Margarita Grizzle,  MD 07/22/16 [phone]

## 2016-07-21 NOTE — ED Triage Notes (Signed)
Per Pt, Pt is coming from home with complaints of LUQ pain and N/V/D that started yesterday morning. Reports having a sinus and ear infection that she was placed on antibiotics one week ago for.

## 2016-07-21 NOTE — Discharge Instructions (Addendum)
Please take potassium chloride 40 mg twice a day tomorrow and have your blood work rechecked with your primary care provider. Please also take keflex Please also take Protonix as needed and see your primary care provider.   Get help right away if: You have pain in your arms, neck, jaw, teeth, or back. You feel sweaty, dizzy, or light-headed. You have chest pain or shortness of breath. You vomit and your vomit looks like blood or coffee grounds. You faint. Your stool is bloody or black. You cannot swallow, drink, or eat. Get help right away if: You have severe back pain or lower abdominal pain. You are vomiting and cannot keep down any medicines or water.

## 2016-07-21 NOTE — ED Notes (Signed)
Patient has been asleep with no vomiting since zofran and ativan administration.

## 2016-07-22 ENCOUNTER — Emergency Department (HOSPITAL_COMMUNITY)
Admission: EM | Admit: 2016-07-22 | Discharge: 2016-07-23 | Disposition: A | Discharge status: Home / Self Care | Payer: Medicaid Other | Attending: Emergency Medicine | Admitting: Emergency Medicine

## 2016-07-22 ENCOUNTER — Emergency Department (HOSPITAL_COMMUNITY): Payer: Medicaid Other

## 2016-07-22 DIAGNOSIS — J45909 Unspecified asthma, uncomplicated: Secondary | ICD-10-CM | POA: Insufficient documentation

## 2016-07-22 DIAGNOSIS — Z79899 Other long term (current) drug therapy: Secondary | ICD-10-CM | POA: Insufficient documentation

## 2016-07-22 DIAGNOSIS — F17210 Nicotine dependence, cigarettes, uncomplicated: Secondary | ICD-10-CM | POA: Diagnosis not present

## 2016-07-22 DIAGNOSIS — R1013 Epigastric pain: Secondary | ICD-10-CM | POA: Diagnosis present

## 2016-07-22 DIAGNOSIS — R112 Nausea with vomiting, unspecified: Secondary | ICD-10-CM | POA: Diagnosis not present

## 2016-07-22 LAB — CBC
HCT: 39.3 % (ref 36.0–46.0)
Hemoglobin: 13.8 g/dL (ref 12.0–15.0)
MCH: 32.5 pg (ref 26.0–34.0)
MCHC: 35.1 g/dL (ref 30.0–36.0)
MCV: 92.5 fL (ref 78.0–100.0)
PLATELETS: 447 10*3/uL — AB (ref 150–400)
RBC: 4.25 MIL/uL (ref 3.87–5.11)
RDW: 12.9 % (ref 11.5–15.5)
WBC: 14.6 10*3/uL — AB (ref 4.0–10.5)

## 2016-07-22 LAB — BASIC METABOLIC PANEL
Anion gap: 14 (ref 5–15)
BUN: 6 mg/dL (ref 6–20)
CALCIUM: 9.1 mg/dL (ref 8.9–10.3)
CO2: 27 mmol/L (ref 22–32)
CREATININE: 0.78 mg/dL (ref 0.44–1.00)
Chloride: 94 mmol/L — ABNORMAL LOW (ref 101–111)
GFR calc Af Amer: >60 mL/min (ref >60)
Glucose, Bld: 97 mg/dL (ref 65–99)
Potassium: 3.2 mmol/L — ABNORMAL LOW (ref 3.5–5.1)
SODIUM: 135 mmol/L (ref 135–145)

## 2016-07-22 LAB — I-STAT TROPONIN, ED: TROPONIN I, POC: 0 ng/mL (ref 0.00–0.08)

## 2016-07-22 MED ORDER — SODIUM CHLORIDE 0.9 % IV BOLUS (SEPSIS)
1000.0000 mL | Freq: Once | INTRAVENOUS | Status: AC
  Administered 2016-07-23: 1000 mL via INTRAVENOUS

## 2016-07-22 MED ORDER — LORAZEPAM 2 MG/ML IJ SOLN
1.0000 mg | Freq: Once | INTRAMUSCULAR | Status: AC
  Administered 2016-07-23: 1 mg via INTRAVENOUS
  Filled 2016-07-22: qty 1

## 2016-07-22 MED ORDER — PROCHLORPERAZINE EDISYLATE 5 MG/ML IJ SOLN
10.0000 mg | Freq: Once | INTRAMUSCULAR | Status: AC
  Administered 2016-07-23: 10 mg via INTRAVENOUS
  Filled 2016-07-22: qty 2

## 2016-07-22 NOTE — ED Triage Notes (Signed)
Pt states seen here yesterday, states intermittent chest pain and N/V. Pt denies any diarrhea or urinary symptoms. Pt states 10 episodes of emesis today.

## 2016-07-22 NOTE — ED Provider Notes (Signed)
[facility]-EMERGENCY DEPT Provider Note   CSN: [id number] Arrival date & time: 07/22/16  2130   By signing my name below, I, Clovis Pu, attest that this documentation has been prepared under the direction and in the presence of Gilda Crease, MD  Electronically Signed: Clovis Pu, ED Scribe. 07/22/16. 11:32 PM.    History   Chief Complaint Chief Complaint  Patient presents with  . Chest Pain  . Nausea    HPI Comments:  Laura Montoya is a 31 y.o. female who presents to the Emergency Department complaining of acute onset, moderate epigastric pain x yesterday. Pt states her symptoms resolved after being discharged from the ED in the early morning today and notes her symptoms later returned. Pt also reports nausea, vomiting, right sided back pain, a headache and mild chest pain. Her pain is worse with palpation. No alleviating factors noted. Pt denies any other associated symptoms. No other complaints noted.   The history is provided by the patient. No language interpreter was used.    Past Medical History:  Diagnosis Date  . Anxiety   . Asthma   . Dizziness   . Dysmenorrhea   . Headache   . Mood disorder (HCC)   . Schizophrenia (HCC)   . Scoliosis   . Thrush     Patient Active Problem List   Diagnosis Date Noted  . Prolonged QT interval 09/14/2015  . Hypomagnesemia 09/14/2015  . Hypokalemia 09/14/2015  . Vomiting and diarrhea 09/14/2015  . Tobacco use disorder 09/14/2015  . 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 Vitamin B12 deficiency 08/12/2015  . 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 Folate deficiency 08/12/2015  . Schizophrenia (HCC) 04/30/2015  . Benign paroxysmal positional vertigo 03/04/2015  . Cervicalgia 05/06/2013  . Myofascial muscle pain 02/06/2013  . Sacroiliitis (HCC) 07/09/2012  . Idiopathic scoliosis 06/12/2012  . Lumbar spondylosis with myelopathy 06/12/2012  . Osteopenia 06/12/2012    Past Surgical History:    Procedure Laterality Date  . BREAST SURGERY    . MOUTH SURGERY      OB History    No data available       Home Medications    Prior to Admission medications   Medication Sig Start Date End Date Taking? Authorizing Provider  acetaminophen-codeine (TYLENOL #3) 300-30 MG tablet Take 1 tablet by mouth 2 (two) times daily as needed for moderate pain. 08/03/15  Yes Ranelle Oyster, MD  cephALEXin (KEFLEX) 500 MG capsule Take 1 capsule (500 mg total) by mouth 2 (two) times daily. 07/21/16 07/26/16 Yes Francisco [address] [place], [place]  meclizine (ANTIVERT) 32 MG tablet Take 1 tablet (32 mg total) by mouth 3 (three) times daily. For 3 days scheduled then as needed thereafter Patient taking differently: Take 32 mg by mouth 3 (three) times daily as needed for dizziness or nausea. For 3 days scheduled then as needed thereafter 03/04/15  Yes Ranelle Oyster, MD  nortriptyline (PAMELOR) 50 MG capsule Take 1 capsule (50 mg total) by mouth at bedtime. Patient taking differently: Take 50 mg by mouth at bedtime as needed (sleep).  06/08/15  Yes Jones Bales, NP  ondansetron (ZOFRAN ODT) 4 MG disintegrating tablet  ODT q4 hours prn nausea/vomit Patient taking differently: Take 4 mg by mouth every 4 (four) hours as needed for nausea.  03/02/15  Yes Benjamin Cartner, PA-C  pantoprazole (PROTONIX) 20 MG tablet Take 1 tablet (20 mg total) by mouth daily. 07/21/16  Yes [address] [place], [place]  potassium chloride (K-DUR) 10 MEQ tablet Take 4 tablets (40 mEq total) by mouth 2 (two) times daily. 07/21/16 07/23/16 Yes Francisco [place], [place]  tiZANidine (ZANAFLEX) 4 MG tablet Take 1 tablet (4 mg total) by mouth every 6 (six) hours as needed for muscle spasms. 08/03/15  Yes Ranelle Oyster, MD  VRAYLAR 3 MG CAPS Take 1 capsule by mouth daily. 05/02/16  Yes Historical Provider, MD  prochlorperazine (COMPAZINE) 25 MG suppository Place 1 suppository (25 mg total) rectally every 12 (twelve) hours as needed for nausea or  vomiting. 07/23/16   Gilda Crease, MD    Family History Family History  Problem Relation Age of Onset  . Thyroid disease Mother   . Asthma Mother   . Mental illness Father   . Hyperlipidemia Father   . Healthy Brother   . Healthy Daughter   . Heart disease Paternal Grandfather   . Heart disease Paternal Grandmother     Social History Social History  Substance Use Topics  . Smoking status: Current Every Day Smoker    Packs/day: 0.25    Years: 16.00    Types: Cigarettes  . Smokeless tobacco: Never Used  . Alcohol use 0.0 oz/week     Comment: One wine cooler per month     Allergies   Lavender oil   Review of Systems Review of Systems  Cardiovascular: Positive for chest pain.  Gastrointestinal: Positive for abdominal pain, nausea and vomiting.  Musculoskeletal: Positive for myalgias.  Neurological: Positive for headaches.  All other systems reviewed and are negative.    Physical Exam Updated Vital Signs BP 106/64   Pulse 74   Temp 98.3 F (36.8 C) (Oral)   Resp (!) 22   LMP 06/01/2016 (Approximate) Comment: Pt states that she is not pregnant  SpO2 95%   Physical Exam  Constitutional: She is oriented to person, place, and time. She appears well-developed and well-nourished. No distress.  HENT:  Head: Normocephalic and atraumatic.  Right Ear: Hearing normal.  Left Ear: Hearing normal.  Nose: Nose normal.  Mouth/Throat: Oropharynx is clear and moist and mucous membranes are normal.  Eyes: Conjunctivae and EOM are normal. Pupils are equal, round, and reactive to light.  Neck: Normal range of motion. Neck supple.  Cardiovascular: Regular rhythm, S1 normal and S2 normal.  Exam reveals no gallop and no friction rub.   No murmur heard. Pulmonary/Chest: Effort normal and breath sounds normal. No respiratory distress. She exhibits no tenderness.  Abdominal: Soft. Normal appearance and bowel sounds are normal. There is no hepatosplenomegaly. There is  tenderness in the epigastric area. There is no rebound, no guarding, no tenderness at McBurney's point and negative Murphy's sign. No hernia.  Musculoskeletal: Normal range of motion.  Neurological: She is alert and oriented to person, place, and time. She has normal strength. No cranial nerve deficit or sensory deficit. Coordination normal. GCS eye subscore is 4. GCS verbal subscore is 5. GCS motor subscore is 6.  Skin: Skin is warm, dry and intact. No rash noted. No cyanosis.  Psychiatric: She has a normal mood and affect. Her speech is normal and behavior is normal. Thought content normal.  Nursing note and vitals reviewed.    ED Treatments / Results  DIAGNOSTIC STUDIES:  Oxygen Saturation is 97% on RA, normal by my interpretation.    COORDINATION OF CARE:  11:28 PM Discussed treatment plan with pt at bedside and pt agreed to plan.  Labs (all labs ordered are listed, but only  abnormal results are displayed) Labs Reviewed  BASIC METABOLIC PANEL - Abnormal; Notable for the following:       Result Value   Potassium 3.2 (*)    Chloride 94 (*)    All other components within normal limits  CBC - Abnormal; Notable for the following:    WBC 14.6 (*)    Platelets 447 (*)    All other components within normal limits  HEPATIC FUNCTION PANEL - Abnormal; Notable for the following:    Albumin 3.4 (*)    ALT 13 (*)    All other components within normal limits  LIPASE, BLOOD - Abnormal; Notable for the following:    Lipase <10 (*)    All other components within normal limits  I-STAT TROPOININ, ED    EKG  EKG Interpretation  Date/Time:  Friday July 22 2016 21:35:18 EDT Ventricular Rate:  92 PR Interval:  126 QRS Duration: 100 QT Interval:  364 QTC Calculation: 450 R Axis:   100 Text Interpretation:  Normal sinus rhythm Possible Left atrial enlargement Rightward axis Incomplete right bundle branch block Borderline ECG No significant change since last tracing Confirmed by Aamari West   MD, Zunaira Lamy [phone]) on 07/22/2016 11:25:19 PM       Radiology Dg Chest 2 View  Result Date: 07/22/2016 CLINICAL DATA:  Chest pain. EXAM: CHEST  2 VIEW COMPARISON:  Radiographs yesterday. FINDINGS: The cardiomediastinal contours are normal. The lungs are clear. Pulmonary vasculature is normal. No consolidation, pleural effusion, or pneumothorax. No acute osseous abnormalities are seen. Right breast implant. Artifact from patient's hair projects over the upper hemithoraces. IMPRESSION: No acute abnormality or change from prior exam. Electronically Signed   By: Rubye Oaks M.D.   On: 07/22/2016 22:08   Dg Chest 2 View  Result Date: 07/21/2016 CLINICAL DATA:  Cough, abdominal pain for 2-3 days. Intermittent vomiting for 2 days. EXAM: CHEST  2 VIEW COMPARISON:  Chest radiograph Sep 14, 2015 FINDINGS: Cardiomediastinal silhouette is normal. No pleural effusions or focal consolidations. Trachea projects midline and there is no pneumothorax. Soft tissue planes and included osseous structures are non-suspicious. IMPRESSION: Normal chest. Electronically Signed   By: Awilda Metro M.D.   On: 07/21/2016 18:09   US Abdomen Limited Ruq  Result Date: 07/21/2016 CLINICAL DATA:  Right upper quadrant pain. EXAM: US ABDOMEN LIMITED - RIGHT UPPER QUADRANT COMPARISON:  09/14/2015.  CT 04/28/2015. FINDINGS: Gallbladder: No gallstones or wall thickening visualized. No sonographic Murphy sign noted by sonographer. Common bile duct: Diameter: 6.0 mm Liver: No focal lesion identified. Within normal limits in parenchymal echogenicity. IMPRESSION: Negative exam. Electronically Signed   By: Maisie Fus  Register   On: 07/21/2016 16:52    Procedures Procedures (including critical care time)  Medications Ordered in ED Medications  sodium chloride 0.9 % bolus 1,000 mL (1,000 mLs Intravenous New Bag/Given 07/23/16 0037)    Followed by  sodium chloride 0.9 % bolus 1,000 mL (1,000 mLs Intravenous New Bag/Given 07/23/16  0037)  prochlorperazine (COMPAZINE) injection 10 mg (10 mg Intravenous Given 07/23/16 0042)  LORazepam (ATIVAN) injection 1 mg (1 mg Intravenous Given 07/23/16 0042)     Initial Impression / Assessment and Plan / ED Course  I have reviewed the triage vital signs and the nursing notes.  Pertinent labs & imaging results that were available during my care of the patient were reviewed by me and considered in my medical decision making (see chart for details).     Patient presents to the ER for evaluation of  nausea and vomiting with abdominal discomfort. Patient has a history of gastritis and recurrent vomiting with similar symptoms. She was seen one day ago but continues to have nausea and vomiting. Examination reveals mild epigastric tenderness without signs of acute surgical process in the abdomen. Blood work is normal other than leukocytosis, this is improved from previous lab work yesterday. She is currently being treated for urinary tract infection. She does not appear septic. Patient was administered Compazine, Benadryl, IV fluids with significant improvement. Patient will be discharged to continue symptomatically treatment.  Final Clinical Impressions(s) / ED Diagnoses   Final diagnoses:  Non-intractable vomiting with nausea, unspecified vomiting type    New Prescriptions New Prescriptions   PROCHLORPERAZINE (COMPAZINE) 25 MG SUPPOSITORY    Place 1 suppository (25 mg total) rectally every 12 (twelve) hours as needed for nausea or vomiting.  I personally performed the services described in this documentation, which was scribed in my presence. The recorded information has been reviewed and is accurate.     Gilda Crease, MD 07/23/16 0230

## 2016-07-22 NOTE — ED Notes (Signed)
Pt given a blanket

## 2016-07-22 NOTE — ED Notes (Signed)
Pt updated that her room is being cleaned.

## 2016-07-23 LAB — URINE CULTURE: Culture: <10000 — AB

## 2016-07-23 LAB — HEPATIC FUNCTION PANEL
ALBUMIN: 3.4 g/dL — AB (ref 3.5–5.0)
ALK PHOS: 110 U/L (ref 38–126)
ALT: 13 U/L — ABNORMAL LOW (ref 14–54)
AST: 23 U/L (ref 15–41)
BILIRUBIN INDIRECT: 0.4 mg/dL (ref 0.3–0.9)
BILIRUBIN TOTAL: 0.6 mg/dL (ref 0.3–1.2)
Bilirubin, Direct: 0.2 mg/dL (ref 0.1–0.5)
TOTAL PROTEIN: 6.7 g/dL (ref 6.5–8.1)

## 2016-07-23 LAB — LIPASE, BLOOD: Lipase: <10 U/L — ABNORMAL LOW (ref 11–51)

## 2016-07-23 MED ORDER — PROCHLORPERAZINE 25 MG RE SUPP: 25.0000 mg | Freq: Two times a day (BID) | RECTAL | 0 refills | Status: DC | PRN

## 2016-07-24 ENCOUNTER — Encounter (HOSPITAL_COMMUNITY): Payer: Self-pay

## 2016-07-24 ENCOUNTER — Emergency Department (HOSPITAL_COMMUNITY)
Admission: EM | Admit: 2016-07-24 | Discharge: 2016-07-24 | Disposition: A | Discharge status: Home / Self Care | Payer: Medicaid Other | Attending: Emergency Medicine | Admitting: Emergency Medicine

## 2016-07-24 ENCOUNTER — Emergency Department (HOSPITAL_COMMUNITY): Payer: Medicaid Other

## 2016-07-24 DIAGNOSIS — F17210 Nicotine dependence, cigarettes, uncomplicated: Secondary | ICD-10-CM | POA: Diagnosis not present

## 2016-07-24 DIAGNOSIS — R112 Nausea with vomiting, unspecified: Secondary | ICD-10-CM | POA: Diagnosis not present

## 2016-07-24 DIAGNOSIS — J45909 Unspecified asthma, uncomplicated: Secondary | ICD-10-CM | POA: Diagnosis not present

## 2016-07-24 DIAGNOSIS — Z79899 Other long term (current) drug therapy: Secondary | ICD-10-CM | POA: Diagnosis not present

## 2016-07-24 DIAGNOSIS — K2900 Acute gastritis without bleeding: Secondary | ICD-10-CM | POA: Diagnosis not present

## 2016-07-24 DIAGNOSIS — R1013 Epigastric pain: Secondary | ICD-10-CM

## 2016-07-24 LAB — CBC WITH DIFFERENTIAL/PLATELET
BASOS PCT: 0 %
Basophils Absolute: 0 10*3/uL (ref 0.0–0.1)
Eosinophils Absolute: 0 10*3/uL (ref 0.0–0.7)
Eosinophils Relative: 0 %
HEMATOCRIT: 38.2 % (ref 36.0–46.0)
Hemoglobin: 13.2 g/dL (ref 12.0–15.0)
LYMPHS PCT: 12 %
Lymphs Abs: 1.9 10*3/uL (ref 0.7–4.0)
MCH: 32 pg (ref 26.0–34.0)
MCHC: 34.6 g/dL (ref 30.0–36.0)
MCV: 92.7 fL (ref 78.0–100.0)
MONO ABS: 0.4 10*3/uL (ref 0.1–1.0)
MONOS PCT: 2 %
NEUTROS ABS: 14.2 10*3/uL — AB (ref 1.7–7.7)
Neutrophils Relative %: 86 %
PLATELETS: 408 10*3/uL — AB (ref 150–400)
RBC: 4.12 MIL/uL (ref 3.87–5.11)
RDW: 12.9 % (ref 11.5–15.5)
WBC: 16.5 10*3/uL — ABNORMAL HIGH (ref 4.0–10.5)

## 2016-07-24 LAB — URINALYSIS, ROUTINE W REFLEX MICROSCOPIC
BILIRUBIN URINE: NEGATIVE
GLUCOSE, UA: NEGATIVE mg/dL
Hgb urine dipstick: NEGATIVE
KETONES UR: 5 mg/dL — AB
Leukocytes, UA: NEGATIVE
NITRITE: NEGATIVE
PH: 6 (ref 5.0–8.0)
PROTEIN: NEGATIVE mg/dL
Specific Gravity, Urine: 1.009 (ref 1.005–1.030)

## 2016-07-24 LAB — I-STAT BETA HCG BLOOD, ED (MC, WL, AP ONLY): HCG, QUANTITATIVE: 7 m[IU]/mL — AB (ref <5)

## 2016-07-24 LAB — COMPREHENSIVE METABOLIC PANEL
ALBUMIN: 3.5 g/dL (ref 3.5–5.0)
ALT: 12 U/L — ABNORMAL LOW (ref 14–54)
ANION GAP: 12 (ref 5–15)
AST: 21 U/L (ref 15–41)
Alkaline Phosphatase: 96 U/L (ref 38–126)
BILIRUBIN TOTAL: 0.4 mg/dL (ref 0.3–1.2)
CO2: 25 mmol/L (ref 22–32)
Calcium: 8.8 mg/dL — ABNORMAL LOW (ref 8.9–10.3)
Chloride: 97 mmol/L — ABNORMAL LOW (ref 101–111)
Creatinine, Ser: 0.76 mg/dL (ref 0.44–1.00)
GFR calc Af Amer: >60 mL/min (ref >60)
GFR calc non Af Amer: >60 mL/min (ref >60)
GLUCOSE: 114 mg/dL — AB (ref 65–99)
POTASSIUM: 3.1 mmol/L — AB (ref 3.5–5.1)
SODIUM: 134 mmol/L — AB (ref 135–145)
TOTAL PROTEIN: 6.1 g/dL — AB (ref 6.5–8.1)

## 2016-07-24 LAB — RAPID URINE DRUG SCREEN, HOSP PERFORMED
Amphetamines: NOT DETECTED
BARBITURATES: NOT DETECTED
Benzodiazepines: NOT DETECTED
COCAINE: NOT DETECTED
Opiates: NOT DETECTED
Tetrahydrocannabinol: POSITIVE — AB

## 2016-07-24 LAB — POC URINE PREG, ED: PREG TEST UR: NEGATIVE

## 2016-07-24 LAB — LIPASE, BLOOD: Lipase: <10 U/L — ABNORMAL LOW (ref 11–51)

## 2016-07-24 MED ORDER — DICYCLOMINE HCL 10 MG PO CAPS
20.0000 mg | ORAL_CAPSULE | Freq: Once | ORAL | Status: AC
  Administered 2016-07-24: 20 mg via ORAL
  Filled 2016-07-24: qty 2

## 2016-07-24 MED ORDER — RANITIDINE HCL 150 MG PO CAPS: 150.0000 mg | ORAL_CAPSULE | Freq: Every day | ORAL | 0 refills | Status: DC

## 2016-07-24 MED ORDER — LORAZEPAM 2 MG/ML IJ SOLN
1.0000 mg | Freq: Once | INTRAMUSCULAR | Status: AC
  Administered 2016-07-24: 1 mg via INTRAVENOUS
  Filled 2016-07-24: qty 1

## 2016-07-24 MED ORDER — SODIUM CHLORIDE 0.9 % IV BOLUS (SEPSIS)
1000.0000 mL | Freq: Once | INTRAVENOUS | Status: AC
  Administered 2016-07-24: 1000 mL via INTRAVENOUS

## 2016-07-24 MED ORDER — IOPAMIDOL (ISOVUE-300) INJECTION 61%
INTRAVENOUS | Status: AC
  Administered 2016-07-24: 100 mL
  Filled 2016-07-24: qty 100

## 2016-07-24 MED ORDER — CAPSAICIN 0.025 % EX CREA
TOPICAL_CREAM | Freq: Once | CUTANEOUS | Status: AC
  Administered 2016-07-24: 15:00:00 via TOPICAL
  Filled 2016-07-24: qty 60

## 2016-07-24 MED ORDER — PROMETHAZINE HCL 25 MG RE SUPP: 25.0000 mg | Freq: Four times a day (QID) | RECTAL | 0 refills | Status: DC | PRN

## 2016-07-24 MED ORDER — SODIUM CHLORIDE 0.9 % IV SOLN
30.0000 meq | Freq: Once | INTRAVENOUS | Status: AC
  Administered 2016-07-24: 30 meq via INTRAVENOUS
  Filled 2016-07-24: qty 15

## 2016-07-24 MED ORDER — HALOPERIDOL LACTATE 5 MG/ML IJ SOLN
5.0000 mg | Freq: Once | INTRAMUSCULAR | Status: AC
  Administered 2016-07-24: 5 mg via INTRAVENOUS
  Filled 2016-07-24: qty 1

## 2016-07-24 NOTE — ED Provider Notes (Signed)
Received care of patient at 4:00 from shunt Joy PA-C. Please see prior note for history, physical and prior care. Briefly, this is a 31 year old female with a history of anxiety, schizophrenia, episodes of nausea and vomiting who presents with concern for nausea and vomiting as well as associated burning chest pain.  Pregnancy test negative. Urinalysis shows no sign of urinary tract infection. Labs show mild hypokalemia. CT abdomen and pelvis shows no acute abnormalities. Left lower lobe nodules were discussed with patient with recommendation of CT in 3 months.  Patient with prior history of prolonged QTc, however today and EKG QTC is within appropriate range. Patient was given 5 mg of IV Haldol, with improvement in her nausea and vomiting. She was given a second liter of normal saline, and 30 mEq of potassium. She was able to tolerate by mouth. Feel she is appropriate for continued outpatient management given she is hemodynamically dynamic stable, tolerating by mouth at this time. Patient reports best relief with Phenergan suppositories, was given a prescription for these. Recommend follow up with gastroenterology, return for worsening symptoms.     Alvira Monday, MD 07/25/16 [id number]

## 2016-07-24 NOTE — ED Notes (Signed)
CT notified that pt is ready for transport 

## 2016-07-24 NOTE — ED Notes (Addendum)
Pt reports she is feeling very anxious stating she needs to relax. She states that she has not been able to come down her daily medications for her bipolar disorder. Pt also c/o a squeezing stomach pain.

## 2016-07-24 NOTE — ED Notes (Signed)
ED Provider at bedside. 

## 2016-07-24 NOTE — ED Notes (Signed)
Gave pt saltines, applesauce, and water.

## 2016-07-24 NOTE — ED Notes (Signed)
Ambulated to bathroom -- transport here to transfer to CT.

## 2016-07-24 NOTE — ED Triage Notes (Signed)
Patient complains of ongoing chest pain since 03/28. This is her 3rd visit this past week for same. All labs have been negative with normal EKG. No labs collected at triage. Hyperventilating on arrival

## 2016-07-24 NOTE — ED Notes (Signed)
Pt requesting more cream for her chest and pt vomiting again in room. PA made aware.

## 2016-07-24 NOTE — ED Provider Notes (Signed)
[facility] Provider Note   CSN: [id number] Arrival date & time: 07/24/16  1055     History   Chief Complaint Chief Complaint  Patient presents with  . Chest Pain    HPI Laura Montoya is a 31 y.o. female.  HPI   Laura Montoya is a 31 y.o. female, with a history of Schizophrenia and anxiety, presenting to the ED with chest and abdominal pain beginning four days ago. Patient states that the pain is nearly constant, 8/10, "like someone is squeezing my insides," epigastric radiating into the chest. Endorses 6-7 episodes of vomiting over the past 24 hours. Occasional diarrhea.  She was seen on March 29 and March 30 for the same complaint. Patient states the only thing that has helped is the Compazine suppository.   She denies fever, shortness of breath, constipation, urinary complaints, abnormal vaginal discharge, or any other complaints.     Past Medical History:  Diagnosis Date  . Anxiety   . Asthma   . Dizziness   . Dysmenorrhea   . Headache   . Mood disorder (HCC)   . Schizophrenia (HCC)   . Scoliosis   . Thrush     Patient Active Problem List   Diagnosis Date Noted  . Prolonged QT interval 09/14/2015  . Hypomagnesemia 09/14/2015  . Hypokalemia 09/14/2015  . Vomiting and diarrhea 09/14/2015  . Tobacco use disorder 09/14/2015  . 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 Vitamin B12 deficiency 08/12/2015  . 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 Folate deficiency 08/12/2015  . Schizophrenia (HCC) 04/30/2015  . Benign paroxysmal positional vertigo 03/04/2015  . Cervicalgia 05/06/2013  . Myofascial muscle pain 02/06/2013  . Sacroiliitis (HCC) 07/09/2012  . Idiopathic scoliosis 06/12/2012  . Lumbar spondylosis with myelopathy 06/12/2012  . Osteopenia 06/12/2012    Past Surgical History:  Procedure Laterality Date  . BREAST SURGERY    . MOUTH SURGERY      OB History    No data available       Home Medications     Prior to Admission medications   Medication Sig Start Date End Date Taking? Authorizing Provider  busPIRone (BUSPAR) 5 MG tablet Take 5 mg by mouth 3 (three) times daily.   Yes Historical Provider, MD  cephALEXin (KEFLEX) 500 MG capsule Take 1 capsule (500 mg total) by mouth 2 (two) times daily. 07/21/16 07/26/16 Yes Francisco Manuel [place], [place]  pantoprazole (PROTONIX) 20 MG tablet Take 1 tablet (20 mg total) by mouth daily. 07/21/16  Yes Francisco [address] [place], [place]  Probiotic Product (PROBIOTIC-10) CHEW Chew 1 tablet by mouth daily.   Yes Historical Provider, MD  prochlorperazine (COMPAZINE) 25 MG suppository Place 1 suppository (25 mg total) rectally every 12 (twelve) hours as needed for nausea or vomiting. 07/23/16  Yes Gilda Crease, MD  VRAYLAR 3 MG CAPS Take 3 mg by mouth daily.  05/02/16  Yes Historical Provider, MD  acetaminophen-codeine (TYLENOL #3) 300-30 MG tablet Take 1 tablet by mouth 2 (two) times daily as needed for moderate pain. Patient not taking: Reported on 07/24/2016 08/03/15   Ranelle Oyster, MD  meclizine (ANTIVERT) 32 MG tablet Take 1 tablet (32 mg total) by mouth 3 (three) times daily. For 3 days scheduled then as needed thereafter Patient not taking: Reported on 07/24/2016 03/04/15   Ranelle Oyster, MD  nortriptyline (PAMELOR) 50 MG capsule Take 1 capsule (50 mg total) by mouth at bedtime. Patient not taking: Reported on 07/24/2016 06/08/15  Jones Bales, NP  ondansetron (ZOFRAN ODT) 4 MG disintegrating tablet  ODT q4 hours prn nausea/vomit Patient not taking: Reported on 07/24/2016 03/02/15   Joycie Peek, PA-C  potassium chloride (K-DUR) 10 MEQ tablet Take 4 tablets (40 mEq total) by mouth 2 (two) times daily. Patient not taking: Reported on 07/24/2016 07/21/16 07/23/16  [facility] [place], [place]  tiZANidine (ZANAFLEX) 4 MG tablet Take 1 tablet (4 mg total) by mouth every 6 (six) hours as needed for muscle spasms. Patient not taking: Reported on 07/24/2016 08/03/15    Ranelle Oyster, MD    Family History Family History  Problem Relation Age of Onset  . Thyroid disease Mother   . Asthma Mother   . Mental illness Father   . Hyperlipidemia Father   . Healthy Brother   . Healthy Daughter   . Heart disease Paternal Grandfather   . Heart disease Paternal Grandmother     Social History Social History  Substance Use Topics  . Smoking status: Current Every Day Smoker    Packs/day: 0.25    Years: 16.00    Types: Cigarettes  . Smokeless tobacco: Never Used  . Alcohol use 0.0 oz/week     Comment: One wine cooler per month     Allergies   Lavender oil   Review of Systems Review of Systems  Constitutional: Negative for chills, diaphoresis and fever.  Respiratory: Negative for cough and shortness of breath.   Cardiovascular: Positive for chest pain.  Gastrointestinal: Positive for abdominal pain, diarrhea, nausea and vomiting.  Genitourinary: Negative for dysuria, hematuria and vaginal discharge.  All other systems reviewed and are negative.    Physical Exam Updated Vital Signs BP (!) 145/81 (BP Location: Right Arm)   Pulse 71   Temp 97.8 F (36.6 C) (Oral)   Resp 20   SpO2 100%   Physical Exam  Constitutional: She appears well-developed and well-nourished. No distress.  HENT:  Head: Normocephalic and atraumatic.  Eyes: Conjunctivae are normal.  Neck: Neck supple.  Cardiovascular: Normal rate, regular rhythm, normal heart sounds and intact distal pulses.   Pulmonary/Chest: Effort normal and breath sounds normal. No respiratory distress.  Abdominal: Soft. There is tenderness in the epigastric area. There is no guarding.  Musculoskeletal: She exhibits no edema.  Lymphadenopathy:    She has no cervical adenopathy.  Neurological: She is alert.  Skin: Skin is warm and dry. She is not diaphoretic.  Psychiatric: She has a normal mood and affect. Her behavior is normal.  Nursing note and vitals reviewed.    ED Treatments /  Results  Labs (all labs ordered are listed, but only abnormal results are displayed) Labs Reviewed  COMPREHENSIVE METABOLIC PANEL - Abnormal; Notable for the following:       Result Value   Sodium 134 (*)    Potassium 3.1 (*)    Chloride 97 (*)    Glucose, Bld 114 (*)    BUN <5 (*)    Calcium 8.8 (*)    Total Protein 6.1 (*)    ALT 12 (*)    All other components within normal limits  LIPASE, BLOOD - Abnormal; Notable for the following:    Lipase <10 (*)    All other components within normal limits  CBC WITH DIFFERENTIAL/PLATELET - Abnormal; Notable for the following:    WBC 16.5 (*)    Platelets 408 (*)    Neutro Abs 14.2 (*)    All other components within normal limits  URINALYSIS, ROUTINE  W REFLEX MICROSCOPIC - Abnormal; Notable for the following:    Ketones, ur 5 (*)    All other components within normal limits  RAPID URINE DRUG SCREEN, HOSP PERFORMED - Abnormal; Notable for the following:    Tetrahydrocannabinol POSITIVE (*)    All other components within normal limits  I-STAT BETA HCG BLOOD, ED ([facility], [facility], [facility] ONLY) - Abnormal; Notable for the following:    I-stat hCG, quantitative 7.0 (*)    All other components within normal limits  POC URINE PREG, ED    EKG  EKG Interpretation None       Radiology Dg Chest 2 View  Result Date: 07/22/2016 CLINICAL DATA:  Chest pain. EXAM: CHEST  2 VIEW COMPARISON:  Radiographs yesterday. FINDINGS: The cardiomediastinal contours are normal. The lungs are clear. Pulmonary vasculature is normal. No consolidation, pleural effusion, or pneumothorax. No acute osseous abnormalities are seen. Right breast implant. Artifact from patient's hair projects over the upper hemithoraces. IMPRESSION: No acute abnormality or change from prior exam. Electronically Signed   By: Rubye Oaks M.D.   On: 07/22/2016 22:08   Ct Abdomen Pelvis W Contrast  Result Date: 07/24/2016 CLINICAL DATA:  Epigastric pain for 4 days. EXAM: CT ABDOMEN AND PELVIS WITH  CONTRAST TECHNIQUE: Multidetector CT imaging of the abdomen and pelvis was performed using the standard protocol following bolus administration of intravenous contrast. CONTRAST:  ISOVUE-300 IOPAMIDOL (ISOVUE-300) INJECTION 61% COMPARISON:  None. FINDINGS: Lower chest: Small patchy areas of ground-glass opacity at the left lung base with the largest area measuring 8 mm. Hepatobiliary: No focal liver abnormality is seen. No gallstones, gallbladder wall thickening, or biliary dilatation. Pancreas: Unremarkable. No pancreatic ductal dilatation or surrounding inflammatory changes. Spleen: Normal in size without focal abnormality. Adrenals/Urinary Tract: Adrenal glands are unremarkable. Kidneys are normal, without renal calculi, focal lesion, or hydronephrosis. Bladder is unremarkable. Stomach/Bowel: Stomach is within normal limits. Appendix appears normal. No evidence of bowel wall thickening, distention, or inflammatory changes. Vascular/Lymphatic: No significant vascular findings are present. No enlarged abdominal or pelvic lymph nodes. Reproductive: Uterus and bilateral adnexa are unremarkable. Other: No abdominal wall hernia or abnormality. No abdominopelvic ascites. Musculoskeletal: No acute osseous abnormality. No lytic or sclerotic osseous lesion. IMPRESSION: 1. No acute abdominal or pelvic pathology. 2. Patchy areas of mild ground-glass opacity at the left lung base which may be secondary to an infectious or inflammatory etiology. Neoplastic etiology is considered less likely. Non-contrast chest CT at 3-6 months is recommended. If the nodules are stable at time of repeat CT, then future CT at 18-24 months (from today's scan) is considered optional for low-risk patients, but is recommended for high-risk patients. This recommendation follows the consensus statement: Guidelines for Management of Incidental Pulmonary Nodules Detected on CT Images: From the Fleischner Society 2017; Radiology 2017; 284:228-243.  Electronically Signed   By: Elige Ko   On: 07/24/2016 16:39    Procedures Procedures (including critical care time)  Medications Ordered in ED Medications  sodium chloride 0.9 % bolus 1,000 mL (0 mLs Intravenous Stopped 07/24/16 1356)  LORazepam (ATIVAN) injection 1 mg (1 mg Intravenous Given 07/24/16 1222)  dicyclomine (BENTYL) capsule 20 mg (20 mg Oral Given 07/24/16 1221)  iopamidol (ISOVUE-300) 61 % injection (100 mLs  Contrast Given 07/24/16 1543)  capsaicin (ZOSTRIX) 0.025 % cream ( Topical Given 07/24/16 1517)     Initial Impression / Assessment and Plan / ED Course  I have reviewed the triage vital signs and the nursing notes.  Pertinent labs &  imaging results that were available during my care of the patient were reviewed by me and considered in my medical decision making (see chart for details).     Patient presents with abdominal pain, chest pain, and nausea and vomiting. Suspect gastritis with reflux. Low suspicion for ACS. HEART score is 0, indicating low risk for a cardiac event. Wells criteria score is 0, indicating low risk for PE. PERC negative.  Ativan improved the patient's vomiting and pain for a short while. Capsaicin also seemed to improve her symptoms for a short time. Abdominal CT shows no acute abdominal abnormalities. Patient continues to have vomiting.  End of shift patient care handoff report given to Alvira Monday, MD.  Plan: Attempt to control patient's N/V. Admit if unsuccessful.  Findings and plan of care discussed with Loren Racer, MD and then Alvira Monday, MD after EDP shift change. Drs. [place] and [place] both personally evaluated and examined this patient.   Vitals:   07/24/16 1445 07/24/16 1530 07/24/16 1615 07/24/16 1647  BP: 120/80 123/90 134/85 (!) 151/87  Pulse: 93 66 65 (!) 103  Resp: 20 (!) 21 (!) 32 16  Temp:      TempSrc:      SpO2: 99% 100% 98% 97%  Weight:      Height:         Final Clinical Impressions(s) / ED  Diagnoses   Final diagnoses:  Epigastric pain  Other acute gastritis without hemorrhage    New Prescriptions New Prescriptions   No medications on file     Concepcion Living 07/24/16 1707    Loren Racer, MD 07/27/16 Serena Croissant

## 2016-08-16 ENCOUNTER — Emergency Department (HOSPITAL_COMMUNITY)
Admission: EM | Admit: 2016-08-16 | Discharge: 2016-08-16 | Disposition: A | Discharge status: Home / Self Care | Payer: Medicaid Other | Attending: Emergency Medicine | Admitting: Emergency Medicine

## 2016-08-16 ENCOUNTER — Encounter (HOSPITAL_COMMUNITY): Payer: Self-pay | Admitting: Emergency Medicine

## 2016-08-16 ENCOUNTER — Other Ambulatory Visit: Payer: Self-pay

## 2016-08-16 DIAGNOSIS — R079 Chest pain, unspecified: Secondary | ICD-10-CM | POA: Insufficient documentation

## 2016-08-16 DIAGNOSIS — Z5321 Procedure and treatment not carried out due to patient leaving prior to being seen by health care provider: Secondary | ICD-10-CM | POA: Diagnosis not present

## 2016-08-16 LAB — CBC
HCT: 41.5 % (ref 36.0–46.0)
Hemoglobin: 14.2 g/dL (ref 12.0–15.0)
MCH: 32.2 pg (ref 26.0–34.0)
MCHC: 34.2 g/dL (ref 30.0–36.0)
MCV: 94.1 fL (ref 78.0–100.0)
PLATELETS: 458 10*3/uL — AB (ref 150–400)
RBC: 4.41 MIL/uL (ref 3.87–5.11)
RDW: 13.1 % (ref 11.5–15.5)
WBC: 19.3 10*3/uL — ABNORMAL HIGH (ref 4.0–10.5)

## 2016-08-16 LAB — BASIC METABOLIC PANEL
Anion gap: 11 (ref 5–15)
CALCIUM: 9.4 mg/dL (ref 8.9–10.3)
CO2: 21 mmol/L — AB (ref 22–32)
CREATININE: 0.64 mg/dL (ref 0.44–1.00)
Chloride: 107 mmol/L (ref 101–111)
GFR calc Af Amer: >60 mL/min (ref >60)
GFR calc non Af Amer: >60 mL/min (ref >60)
GLUCOSE: 107 mg/dL — AB (ref 65–99)
Potassium: 3.5 mmol/L (ref 3.5–5.1)
Sodium: 139 mmol/L (ref 135–145)

## 2016-08-16 LAB — I-STAT TROPONIN, ED: TROPONIN I, POC: 0 ng/mL (ref 0.00–0.08)

## 2016-08-16 NOTE — ED Triage Notes (Signed)
Pt arrives to ED with left sided chest pain and n/v. Pt is hyperventilating and rocking back and forth and seems very anxious.

## 2016-08-16 NOTE — ED Notes (Addendum)
Pt came to nurses station and stated she did not want to wait for a room, informed pt of how many were ahead of her. Pt decided to leave.

## 2016-08-18 ENCOUNTER — Emergency Department (HOSPITAL_COMMUNITY)
Admission: EM | Admit: 2016-08-18 | Discharge: 2016-08-18 | Disposition: A | Discharge status: Home / Self Care | Payer: Medicaid Other | Attending: Emergency Medicine | Admitting: Emergency Medicine

## 2016-08-18 ENCOUNTER — Encounter (HOSPITAL_COMMUNITY): Payer: Self-pay

## 2016-08-18 DIAGNOSIS — J45909 Unspecified asthma, uncomplicated: Secondary | ICD-10-CM | POA: Diagnosis not present

## 2016-08-18 DIAGNOSIS — R112 Nausea with vomiting, unspecified: Secondary | ICD-10-CM | POA: Insufficient documentation

## 2016-08-18 DIAGNOSIS — F17210 Nicotine dependence, cigarettes, uncomplicated: Secondary | ICD-10-CM | POA: Diagnosis not present

## 2016-08-18 DIAGNOSIS — R1110 Vomiting, unspecified: Secondary | ICD-10-CM | POA: Diagnosis present

## 2016-08-18 DIAGNOSIS — Z79899 Other long term (current) drug therapy: Secondary | ICD-10-CM | POA: Insufficient documentation

## 2016-08-18 LAB — CBC
HCT: 41.8 % (ref 36.0–46.0)
Hemoglobin: 14.4 g/dL (ref 12.0–15.0)
MCH: 32.3 pg (ref 26.0–34.0)
MCHC: 34.4 g/dL (ref 30.0–36.0)
MCV: 93.7 fL (ref 78.0–100.0)
Platelets: 458 10*3/uL — ABNORMAL HIGH (ref 150–400)
RBC: 4.46 MIL/uL (ref 3.87–5.11)
RDW: 13.1 % (ref 11.5–15.5)
WBC: 16.6 10*3/uL — ABNORMAL HIGH (ref 4.0–10.5)

## 2016-08-18 LAB — I-STAT BETA HCG BLOOD, ED (MC, WL, AP ONLY)

## 2016-08-18 LAB — LIPASE, BLOOD: Lipase: 23 U/L (ref 11–51)

## 2016-08-18 LAB — URINALYSIS, ROUTINE W REFLEX MICROSCOPIC
BILIRUBIN URINE: NEGATIVE
Glucose, UA: NEGATIVE mg/dL
KETONES UR: 80 mg/dL — AB
LEUKOCYTES UA: NEGATIVE
NITRITE: NEGATIVE
PROTEIN: NEGATIVE mg/dL
Specific Gravity, Urine: 1.015 (ref 1.005–1.030)
pH: 6 (ref 5.0–8.0)

## 2016-08-18 LAB — COMPREHENSIVE METABOLIC PANEL
ALK PHOS: 92 U/L (ref 38–126)
ALT: 12 U/L — AB (ref 14–54)
AST: 21 U/L (ref 15–41)
Albumin: 3.9 g/dL (ref 3.5–5.0)
Anion gap: 10 (ref 5–15)
BUN: 6 mg/dL (ref 6–20)
CALCIUM: 9.3 mg/dL (ref 8.9–10.3)
CO2: 25 mmol/L (ref 22–32)
CREATININE: 0.89 mg/dL (ref 0.44–1.00)
Chloride: 100 mmol/L — ABNORMAL LOW (ref 101–111)
GFR calc non Af Amer: >60 mL/min (ref >60)
Glucose, Bld: 99 mg/dL (ref 65–99)
Potassium: 3.2 mmol/L — ABNORMAL LOW (ref 3.5–5.1)
SODIUM: 135 mmol/L (ref 135–145)
Total Bilirubin: 0.4 mg/dL (ref 0.3–1.2)
Total Protein: 6.8 g/dL (ref 6.5–8.1)

## 2016-08-18 MED ORDER — DICYCLOMINE HCL 10 MG/ML IM SOLN
20.0000 mg | Freq: Once | INTRAMUSCULAR | Status: AC
  Administered 2016-08-18: 20 mg via INTRAMUSCULAR
  Filled 2016-08-18: qty 2

## 2016-08-18 MED ORDER — ACETAMINOPHEN 500 MG PO TABS
500.0000 mg | ORAL_TABLET | Freq: Once | ORAL | Status: AC
  Administered 2016-08-18: 500 mg via ORAL
  Filled 2016-08-18: qty 1

## 2016-08-18 MED ORDER — GI COCKTAIL ~~LOC~~
30.0000 mL | Freq: Once | ORAL | Status: AC
  Administered 2016-08-18: 30 mL via ORAL
  Filled 2016-08-18: qty 30

## 2016-08-18 MED ORDER — PROCHLORPERAZINE EDISYLATE 5 MG/ML IJ SOLN
10.0000 mg | Freq: Once | INTRAMUSCULAR | Status: AC
  Administered 2016-08-18: 10 mg via INTRAVENOUS
  Filled 2016-08-18: qty 2

## 2016-08-18 MED ORDER — SODIUM CHLORIDE 0.9 % IV BOLUS (SEPSIS)
1000.0000 mL | Freq: Once | INTRAVENOUS | Status: AC
  Administered 2016-08-18: 1000 mL via INTRAVENOUS

## 2016-08-18 NOTE — ED Notes (Signed)
Gave pt 2 warm blankets per pt request

## 2016-08-18 NOTE — ED Triage Notes (Signed)
Patient complains of recurrent abdominal pain with nausea and some vomiting that she relates to her GERD. Hyperventilating on arrival. Alert and oriernted

## 2016-08-18 NOTE — ED Notes (Signed)
Pt is drinking her own drinks from home even though was asked not to.

## 2016-08-18 NOTE — ED Notes (Signed)
Pt tolerating liquids with no vomiting

## 2016-08-18 NOTE — Discharge Instructions (Signed)
Please read instructions below. You can continue taking your previously prescribed Phenergan suppositories as needed for nausea. Started off by drinking water slowly, and then you can begin to eat bland foods as tolerated.  Follow up with your primary care provider for further workup of your recurrent symptoms.

## 2016-08-18 NOTE — ED Notes (Signed)
Pt is a/o x4.  Pt aware of follow up appointments.  Pt escorted from ED via wheelchair.

## 2016-08-18 NOTE — ED Notes (Signed)
Pt tolerating PO tylenol with no vomiting. PA informed

## 2016-08-18 NOTE — ED Provider Notes (Signed)
[facility] Provider Note   CSN: [id number] Arrival date & time: 08/18/16  1130     History   Chief Complaint Chief Complaint  Patient presents with  . Abdominal Pain  . Emesis    HPI Laura Montoya is a 31 y.o. female.  Patient with past medical history of GERD, schizophrenia, anxiety, presents with LUQ /epigastric abdominal pain and nausea/vomiting for 2 days. Patient with multiple ED visits this month for similar symptoms. No new changes in symptoms from previous. Patient states she has been vomiting about 10 times per day for the past 2 days, with some improvement with Phenergan suppositories. Reports intermittent chills. Home treatment regimen for GERD includes Zantac, hydroxyzine, sucralfate. Patient reports her PCP is currently working her up for H. Pylori as cause of her recurrent GERD symptoms. Denies fever, urinary symptoms, vaginal bleeding or discharge. Denies history of diabetes.      Past Medical History:  Diagnosis Date  . Anxiety   . Asthma   . Dizziness   . Dysmenorrhea   . Headache   . Mood disorder (HCC)   . Schizophrenia (HCC)   . Scoliosis   . Thrush     Patient Active Problem List   Diagnosis Date Noted  . Prolonged QT interval 09/14/2015  . Hypomagnesemia 09/14/2015  . Hypokalemia 09/14/2015  . Vomiting and diarrhea 09/14/2015  . Tobacco use disorder 09/14/2015  . 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 Vitamin B12 deficiency 08/12/2015  . 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 Folate deficiency 08/12/2015  . Schizophrenia (HCC) 04/30/2015  . Benign paroxysmal positional vertigo 03/04/2015  . Cervicalgia 05/06/2013  . Myofascial muscle pain 02/06/2013  . Sacroiliitis (HCC) 07/09/2012  . Idiopathic scoliosis 06/12/2012  . Lumbar spondylosis with myelopathy 06/12/2012  . Osteopenia 06/12/2012    Past Surgical History:  Procedure Laterality Date  . BREAST SURGERY    . MOUTH SURGERY      OB History      No data available       Home Medications    Prior to Admission medications   Medication Sig Start Date End Date Taking? Authorizing Provider  busPIRone (BUSPAR) 5 MG tablet Take 5 mg by mouth 3 (three) times daily.   Yes Historical Provider, MD  hydrOXYzine (ATARAX/VISTARIL) 50 MG tablet Take 50 mg by mouth 3 (three) times daily as needed for nausea.   Yes Historical Provider, MD  pantoprazole (PROTONIX) 20 MG tablet Take 1 tablet (20 mg total) by mouth daily. 07/21/16  Yes Francisco [address] [place], [place]  Probiotic Product (PROBIOTIC-10) CHEW Chew 1 tablet by mouth daily.   Yes Historical Provider, MD  promethazine (PHENERGAN) 25 MG suppository Place 1 suppository (25 mg total) rectally every 6 (six) hours as needed for nausea or vomiting. 07/24/16  Yes Alvira Monday, MD  ranitidine (ZANTAC) 150 MG capsule Take 1 capsule (150 mg total) by mouth daily. 07/24/16  Yes Alvira Monday, MD  sucralfate (CARAFATE) 1 g tablet Take 1 g by mouth 4 (four) times daily -  with meals and at bedtime.   Yes Historical Provider, MD  VRAYLAR 3 MG CAPS Take 3 mg by mouth daily.  05/02/16  Yes Historical Provider, MD  prochlorperazine (COMPAZINE) 25 MG suppository Place 1 suppository (25 mg total) rectally every 12 (twelve) hours as needed for nausea or vomiting. Patient not taking: Reported on 08/18/2016 07/23/16   Gilda Crease, MD    Family History Family History  Problem Relation Age of Onset  .  Thyroid disease Mother   . Asthma Mother   . Mental illness Father   . Hyperlipidemia Father   . Healthy Brother   . Healthy Daughter   . Heart disease Paternal Grandfather   . Heart disease Paternal Grandmother     Social History Social History  Substance Use Topics  . Smoking status: Current Every Day Smoker    Packs/day: 0.25    Years: 16.00    Types: Cigarettes  . Smokeless tobacco: Never Used  . Alcohol use 0.0 oz/week     Comment: One wine cooler per month     Allergies   Lavender  oil   Review of Systems Review of Systems  Constitutional: Positive for chills. Negative for fever.  Gastrointestinal: Positive for abdominal pain (Left upper quadrant, epigastric), nausea and vomiting.  Genitourinary: Negative for dysuria, frequency, vaginal bleeding and vaginal discharge.     Physical Exam Updated Vital Signs BP 99/64   Pulse 70   Temp 98.2 F (36.8 C) (Oral)   Resp 18   SpO2 96%   Physical Exam  Constitutional: She is oriented to person, place, and time. She appears well-developed and well-nourished.  Patient appears uncomfortable  HENT:  Head: Normocephalic and atraumatic.  Eyes: Conjunctivae are normal.  Neck: Normal range of motion. Neck supple.  Cardiovascular: Normal rate, regular rhythm, normal heart sounds and intact distal pulses.  Exam reveals no friction rub.   No murmur heard. Pulmonary/Chest: Effort normal and breath sounds normal. No respiratory distress. She has no wheezes. She has no rales.  Abdominal: Soft. Bowel sounds are normal. She exhibits no distension. There is tenderness (Left upper quadrant, epigastric). There is no rebound and no guarding.  Neurological: She is alert and oriented to person, place, and time.  Skin: She is not diaphoretic.  Psychiatric: She has a normal mood and affect. Her behavior is normal.  Nursing note and vitals reviewed.    ED Treatments / Results  Labs (all labs ordered are listed, but only abnormal results are displayed) Labs Reviewed  COMPREHENSIVE METABOLIC PANEL - Abnormal; Notable for the following:       Result Value   Potassium 3.2 (*)    Chloride 100 (*)    ALT 12 (*)    All other components within normal limits  CBC - Abnormal; Notable for the following:    WBC 16.6 (*)    Platelets 458 (*)    All other components within normal limits  URINALYSIS, ROUTINE W REFLEX MICROSCOPIC - Abnormal; Notable for the following:    APPearance HAZY (*)    Hgb urine dipstick MODERATE (*)    Ketones, ur  80 (*)    Bacteria, UA RARE (*)    Squamous Epithelial / LPF 6-30 (*)    All other components within normal limits  LIPASE, BLOOD  I-STAT BETA HCG BLOOD, ED ([facility], [facility], [facility] ONLY)    EKG  EKG Interpretation None       Radiology No results found.  Procedures Procedures (including critical care time)  Medications Ordered in ED Medications  sodium chloride 0.9 % bolus 1,000 mL (1,000 mLs Intravenous New Bag/Given 08/18/16 1301)  dicyclomine (BENTYL) injection 20 mg (20 mg Intramuscular Given 08/18/16 1255)  prochlorperazine (COMPAZINE) injection 10 mg (10 mg Intravenous Given 08/18/16 1256)  acetaminophen (TYLENOL) tablet 500 mg (500 mg Oral Given 08/18/16 1337)  gi cocktail (Maalox,Lidocaine,Donnatal) (30 mLs Oral Given 08/18/16 1411)     Initial Impression / Assessment and Plan / ED Course  I have reviewed the triage vital signs and the nursing notes.  Pertinent labs & imaging results that were available during my care of the patient were reviewed by me and considered in my medical decision making (see chart for details).     Patient with left upper quadrant epigastric abdominal pain and nausea/vomiting. Suspect gastritis with GERD. Labs reassuring, patient afebrile, not tachycardic. Slightly elevated WBC, likely 2/t multiple episodes of vomiting for 2 days. Nausea improved with Compazine. Pain improved with Bentyl and Tylenol. 1 L of fluids given. Patient requested GI cocktail, with further improvement in symptoms. On reassessment, patient resting comfortably with improvement in nausea. PO challenge done, patient tolerating liquids. Patient afebrile, hemodynamically stable, safe for discharge home with PCP follow-up.  Patient discussed with and seen by Dr. Jeraldine Loots.  Discussed results, findings, treatment and follow up. Patient advised of return precautions. Patient verbalized understanding and agreed with plan.   Final Clinical Impressions(s) / ED Diagnoses   Final diagnoses:   Nausea and vomiting in adult    New Prescriptions New Prescriptions   No medications on file     [place] N Russo, PA-C 08/18/16 1530    Gerhard Munch, MD 08/25/16 [phone]

## 2016-08-18 NOTE — ED Notes (Signed)
Pt sticking finger down throat to induce vomiting.  Pt was asked not to do that by this RN

## 2016-09-27 ENCOUNTER — Encounter (INDEPENDENT_AMBULATORY_CARE_PROVIDER_SITE_OTHER): Payer: Self-pay

## 2016-09-27 ENCOUNTER — Ambulatory Visit (INDEPENDENT_AMBULATORY_CARE_PROVIDER_SITE_OTHER): Payer: Medicaid Other | Admitting: Gastroenterology

## 2016-09-27 ENCOUNTER — Encounter: Payer: Self-pay | Admitting: Gastroenterology

## 2016-09-27 VITALS — BP 106/70 | HR 84 | Ht 62.5 in | Wt 173.2 lb

## 2016-09-27 DIAGNOSIS — K219 Gastro-esophageal reflux disease without esophagitis: Secondary | ICD-10-CM | POA: Diagnosis not present

## 2016-09-27 DIAGNOSIS — R194 Change in bowel habit: Secondary | ICD-10-CM | POA: Diagnosis not present

## 2016-09-27 DIAGNOSIS — F1290 Cannabis use, unspecified, uncomplicated: Secondary | ICD-10-CM

## 2016-09-27 DIAGNOSIS — D72829 Elevated white blood cell count, unspecified: Secondary | ICD-10-CM

## 2016-09-27 NOTE — Progress Notes (Signed)
HPI :  31 y/o female here for a follow up visit. She was admitted in January of last year with severe metabolic derangement thought to be related to viral gastroenteritis. She had postprandial symptoms and reflux have persisted after having this occurred which led to an upper endoscopy which was remarkable for hiatal hernia, otherwise unremarkable. She was placed on omeprazole 40 mg once a day at that time have not seen her since then.  Patient here for a follow up visit. She reports reflux which is bothering her, usually in the early morning. She endorses pressure in her epigastric area, with acid taste in her mouth.  Usually worse with eating, trying to eat small meals to prevent or minimize symptoms. She can full easily at times. She denies dysphagia. She eating okay. No vomiting. She reports symptoms bothering her about for the past 6 months, usually once per month it bothers her severely. She states she vomited once last week in association with severe reflux symptoms and had a small amount of blood in it.   She was on protonix 20mg  as needed, only once per week, and then zantac to take daily which helped for a period of time. On this regimen she was having breakthrough. She just started nexium 40mg  x 2 days, too early to say if it has helped her.   She is having some loose stools since April. She is having roughly one BM early in the morning which she wakes up with and then no other stools other than that. She denies any blood in the stools. No other lower abdominal pains.   She does endorse smoking marijauana about 3-4 times per day which she has been doing long standing.   Recent labs done per primary care as below, showing leukocytosis which has been present since April.  Labs: 09/22/16 - BUN 2, Cr 0.73 Normal electrolytes LFTs normal HCG negative CBC - 17.3 > 14.1 / 42.7 < 386 TSH 0.934 [name] negative  CT abdomen 07/24/16 - patchy ground glass opacity in the left lung base - f/u CT in  3-6 months recommended US abdomen 07/21/16 - normal GB, no pathology CT abdomen 04/28/15 - normal  EGD 05/15/2015 - 2cm hiatal hernia, mild gastritis - normal biopsies, negative for HP, negative biopsies for EoE   Past Medical History:  Diagnosis Date  . Anxiety   . Asthma   . Dizziness   . Dysmenorrhea   . Headache   . Mood disorder (HCC)   . Schizophrenia (HCC)   . Scoliosis   . Thrush      Past Surgical History:  Procedure Laterality Date  . BREAST SURGERY    . MOUTH SURGERY     Family History  Problem Relation Age of Onset  . Thyroid disease Mother   . Asthma Mother   . Mental illness Father   . Hyperlipidemia Father   . Healthy Brother   . Healthy Daughter   . Heart disease Paternal Grandfather   . Heart disease Paternal Grandmother    Social History  Substance Use Topics  . Smoking status: Current Every Day Smoker    Packs/day: 0.25    Years: 16.00    Types: Cigarettes  . Smokeless tobacco: Never Used  . Alcohol use 0.0 oz/week     Comment: One wine cooler per month   Current Outpatient Prescriptions  Medication Sig Dispense Refill  . hydrOXYzine (ATARAX/VISTARIL) 50 MG tablet Take 50 mg by mouth 3 (three) times daily as needed  for nausea.    . Probiotic Product (PROBIOTIC-10) CHEW Chew 1 tablet by mouth daily.    . promethazine (PHENERGAN) 25 MG suppository Place 1 suppository (25 mg total) rectally every 6 (six) hours as needed for nausea or vomiting. 12 each 0  . ranitidine (ZANTAC) 150 MG capsule Take 1 capsule (150 mg total) by mouth daily. 30 capsule 0  . tiZANidine (ZANAFLEX) 4 MG tablet Take 4 mg by mouth at bedtime.    Marland Kitchen VRAYLAR 3 MG CAPS Take 3 mg by mouth daily.   1   No current facility-administered medications for this visit.    Allergies  Allergen Reactions  . Lavender Oil Rash     Review of Systems: All systems reviewed and negative except where noted in HPI.   CBC Latest Ref Rng & Units 08/18/2016 08/16/2016 07/24/2016  WBC 4.0 - 10.5  K/uL 16.6(H) 19.3(H) 16.5(H)  Hemoglobin 12.0 - 15.0 g/dL 95.2 84.1 32.4  Hematocrit 36.0 - 46.0 % 41.8 41.5 38.2  Platelets 150 - 400 K/uL 458(H) 458(H) 408(H)    Lab Results  Component Value Date   CREATININE 0.89 08/18/2016   BUN 6 08/18/2016   NA 135 08/18/2016   K 3.2 (L) 08/18/2016   CL 100 (L) 08/18/2016   CO2 25 08/18/2016    Lab Results  Component Value Date   ALT 12 (L) 08/18/2016   AST 21 08/18/2016   ALKPHOS 92 08/18/2016   BILITOT 0.4 08/18/2016     Physical Exam: BP 106/70 (BP Location: Left Arm, Patient Position: Sitting, Cuff Size: Normal)   Pulse 84   Ht 5' 2.5" (1.588 m) Comment: height measured without shoes  Wt 173 lb 4 oz (78.6 kg)   BMI 31.18 kg/m  Constitutional: Pleasant,well-developed, female in no acute distress. HEENT: Normocephalic and atraumatic. Conjunctivae are normal. No scleral icterus. Neck supple.  Cardiovascular: Normal rate, regular rhythm.  Pulmonary/chest: Effort normal and breath sounds normal. No wheezing, rales or rhonchi. Abdominal: Soft, nondistended, nontender. There are no masses palpable. No hepatomegaly. Extremities: no edema Lymphadenopathy: No cervical adenopathy noted. Neurological: Alert and oriented to person place and time. Skin: Skin is warm and dry. No rashes noted. Psychiatric: Normal mood and affect. Behavior is normal.   ASSESSMENT AND PLAN: 31 year old female here for reassessment of the following issues:  GERD - she was having relatively frequent breakthrough on low-dose Protonix, just started Nexium 40 mg once a day a few days ago. Recommend she continue with this, and can increase to twice daily as needed. Also recommend she use Maalox as needed for breakthrough see if this helps. I reassured her recent upper endoscopy did not show any significant pathology, perhaps she had a Mallory-Weiss tear or gastritis causing her one episode of bleeding, but don't think she needs an endoscopy given results for last  exam. Of note she smokes marijuana frequently. I recommend she completely abstain from this as it can cause cannabinoid hyperemesis syndrome, makes reflux worse and cause nausea and vomiting. If her symptoms persist despite these measures she should contact me for reassessment.  Change in bowel habits - loose stools ongoing for a few months, one bowel movement per day on average. Leukocytosis noted which has persisted since April. I think she needs stool studies to ensure no C. difficile and fecal leukocytes, she states she already had this done a primary care office, will obtain records to ensure negative. If negative she can use Imodium as needed.   Leukocytosis - present for over  2 months at this point, I don't think related to her reflux symptoms. Prior CT scan showed pulmonary abnormality, unclear if this is related. She needs stool studies to rule out C. difficile which could cause this. If stool testing negative for lactoferrin would seem unlikely related to her bowel changes. Recommend she follow up with her primary care to have this monitored, defer to decision regarding hematology consultation to primary care if this persists without clear etiology. She has plans for repeat CT to evaluate pulmonary abnormality when last imaged.   Marijauna use - as above, recommend complete cessation  Ileene PatrickSteven Armbruster, MD [facility] Pager [phone]  CC: Lonie Peakonroy, Nathan, PA-C

## 2016-09-27 NOTE — Patient Instructions (Signed)
If you are age 33 or older, your body mass index should be between 23-30. Your Body mass index is 31.18 kg/m. If this is out of the aforementioned range listed, please consider follow up with your Primary Care Provider.  If you are age 33 or younger, your body mass index should be between 19-25. Your Body mass index is 31.18 kg/m. If this is out of the aformentioned range listed, please consider follow up with your Primary Care Provider.   We will contact Laura Montoya in regards to your stool studies.  You may use Immodium as needed if stool studies are negative.   Please take your Nexium once to twice daily as needed.  You may use Maalox as needed.  Please follow up with your PCP about your abnormal blood counts.  Please abstain for Marijuana use.  Thank you.

## 2016-09-29 ENCOUNTER — Telehealth: Payer: Self-pay | Admitting: Gastroenterology

## 2016-09-29 NOTE — Telephone Encounter (Signed)
Results of prior stool studies obtained.   She tested negative for C diff and stool culture on 08/19/16  Laura FanningJulie can you please let her know she can use immodium as needed for the diarrhea and see if this helps.  If her symptoms persist despite this, she should contact me in a week or so for reassessment. Thanks

## 2016-09-30 NOTE — Telephone Encounter (Signed)
Left message for patient, I have not heard back from her. Sent letter with results and recommendations. If not better asked that she contact office.

## 2016-10-17 ENCOUNTER — Emergency Department (HOSPITAL_COMMUNITY)
Admission: EM | Admit: 2016-10-17 | Discharge: 2016-10-17 | Disposition: A | Discharge status: Home / Self Care | Payer: Medicaid Other | Attending: Emergency Medicine | Admitting: Emergency Medicine

## 2016-10-17 ENCOUNTER — Encounter (HOSPITAL_COMMUNITY): Payer: Self-pay | Admitting: *Deleted

## 2016-10-17 DIAGNOSIS — R1115 Cyclical vomiting syndrome unrelated to migraine: Secondary | ICD-10-CM

## 2016-10-17 DIAGNOSIS — F1210 Cannabis abuse, uncomplicated: Secondary | ICD-10-CM | POA: Diagnosis not present

## 2016-10-17 DIAGNOSIS — F17210 Nicotine dependence, cigarettes, uncomplicated: Secondary | ICD-10-CM | POA: Insufficient documentation

## 2016-10-17 DIAGNOSIS — G43A1 Cyclical vomiting, intractable: Secondary | ICD-10-CM | POA: Diagnosis not present

## 2016-10-17 DIAGNOSIS — J45909 Unspecified asthma, uncomplicated: Secondary | ICD-10-CM | POA: Insufficient documentation

## 2016-10-17 LAB — CBC
HCT: 37.7 % (ref 36.0–46.0)
Hemoglobin: 13.1 g/dL (ref 12.0–15.0)
MCH: 32.5 pg (ref 26.0–34.0)
MCHC: 34.7 g/dL (ref 30.0–36.0)
MCV: 93.5 fL (ref 78.0–100.0)
Platelets: 400 10*3/uL (ref 150–400)
RBC: 4.03 MIL/uL (ref 3.87–5.11)
RDW: 13.1 % (ref 11.5–15.5)
WBC: 14.5 10*3/uL — ABNORMAL HIGH (ref 4.0–10.5)

## 2016-10-17 LAB — COMPREHENSIVE METABOLIC PANEL
ALK PHOS: 92 U/L (ref 38–126)
ALT: 10 U/L — ABNORMAL LOW (ref 14–54)
ANION GAP: 10 (ref 5–15)
AST: 22 U/L (ref 15–41)
Albumin: 3.7 g/dL (ref 3.5–5.0)
BUN: <5 mg/dL — ABNORMAL LOW (ref 6–20)
CALCIUM: 9.1 mg/dL (ref 8.9–10.3)
CHLORIDE: 106 mmol/L (ref 101–111)
CO2: 19 mmol/L — AB (ref 22–32)
Creatinine, Ser: 0.82 mg/dL (ref 0.44–1.00)
GFR calc non Af Amer: >60 mL/min (ref >60)
Glucose, Bld: 147 mg/dL — ABNORMAL HIGH (ref 65–99)
Potassium: 3.4 mmol/L — ABNORMAL LOW (ref 3.5–5.1)
SODIUM: 135 mmol/L (ref 135–145)
Total Bilirubin: 0.6 mg/dL (ref 0.3–1.2)
Total Protein: 6.3 g/dL — ABNORMAL LOW (ref 6.5–8.1)

## 2016-10-17 LAB — LIPASE, BLOOD: LIPASE: 25 U/L (ref 11–51)

## 2016-10-17 MED ORDER — CAPSICUM OLEORESIN 0.025 % EX CREA
TOPICAL_CREAM | Freq: Three times a day (TID) | CUTANEOUS | Status: DC
  Filled 2016-10-17: qty 60

## 2016-10-17 MED ORDER — POTASSIUM CHLORIDE 10 MEQ/100ML IV SOLN
10.0000 meq | Freq: Once | INTRAVENOUS | Status: AC
  Administered 2016-10-17: 10 meq via INTRAVENOUS
  Filled 2016-10-17: qty 100

## 2016-10-17 MED ORDER — ONDANSETRON HCL 4 MG/2ML IJ SOLN
4.0000 mg | Freq: Once | INTRAMUSCULAR | Status: AC
  Administered 2016-10-17: 4 mg via INTRAVENOUS
  Filled 2016-10-17: qty 2

## 2016-10-17 MED ORDER — SODIUM CHLORIDE 0.9 % IV BOLUS (SEPSIS)
1000.0000 mL | Freq: Once | INTRAVENOUS | Status: AC
  Administered 2016-10-17: 1000 mL via INTRAVENOUS

## 2016-10-17 MED ORDER — SODIUM CHLORIDE 0.9 % IV BOLUS (SEPSIS)
1000.0000 mL | Freq: Once | INTRAVENOUS | Status: AC
Start: 2016-10-17 — End: 2016-10-17
  Administered 2016-10-17: 1000 mL via INTRAVENOUS

## 2016-10-17 MED ORDER — HALOPERIDOL LACTATE 5 MG/ML IJ SOLN
5.0000 mg | Freq: Once | INTRAMUSCULAR | Status: AC
  Administered 2016-10-17: 5 mg via INTRAVENOUS
  Filled 2016-10-17: qty 1

## 2016-10-17 MED ORDER — CAPSAICIN 0.025 % EX CREA
TOPICAL_CREAM | Freq: Two times a day (BID) | CUTANEOUS | Status: DC
  Administered 2016-10-17: 11:00:00 via TOPICAL
  Filled 2016-10-17: qty 60

## 2016-10-17 MED ORDER — CAPSAICIN 0.075 % EX CREA: TOPICAL_CREAM | Freq: Once | CUTANEOUS | Status: DC

## 2016-10-17 MED ORDER — ONDANSETRON 4 MG PO TBDP: 4.0000 mg | ORAL_TABLET | ORAL | 0 refills | Status: DC | PRN

## 2016-10-17 MED ORDER — PROMETHAZINE HCL 25 MG RE SUPP: 25.0000 mg | Freq: Four times a day (QID) | RECTAL | 0 refills | Status: DC | PRN

## 2016-10-17 MED ORDER — PANTOPRAZOLE SODIUM 40 MG IV SOLR
40.0000 mg | Freq: Once | INTRAVENOUS | Status: AC
  Administered 2016-10-17: 40 mg via INTRAVENOUS
  Filled 2016-10-17: qty 40

## 2016-10-17 NOTE — ED Notes (Signed)
Capsaicin applied to abdominal area.

## 2016-10-17 NOTE — ED Provider Notes (Signed)
[facility]-EMERGENCY DEPT Provider Note   CSN: [id number] Arrival date & time: 10/17/16  [id number]     History   Chief Complaint Chief Complaint  Patient presents with  . Emesis    HPI Laura Montoya is a 31 y.o. female.  HPI Patient reports she has had recurrence of similar symptoms. She states she started vomiting at 4 in the morning. She reports several episodes of vomiting and several episodes of diarrhea. She reports she feels dehydrated. She has generalized cramping and burning abdominal pain. No fever. No urinary symptoms. This has been a recurrent problem for the patient. She has been seen by gastroenterology. Patient has been diagnosed with significant GERD as well as marijuana usage. She reports she is cutting back on marijuana usage. Denies any other drug use. Had not tried capsaicin cream this morning. Reports she has run out. Past Medical History:  Diagnosis Date  . Anxiety   . Asthma   . Dizziness   . Dysmenorrhea   . Headache   . Mood disorder (HCC)   . Schizophrenia (HCC)   . Scoliosis   . Thrush     Patient Active Problem List   Diagnosis Date Noted  . Prolonged QT interval 09/14/2015  . Hypomagnesemia 09/14/2015  . Hypokalemia 09/14/2015  . Vomiting and diarrhea 09/14/2015  . Tobacco use disorder 09/14/2015  . 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 Vitamin B12 deficiency 08/12/2015  . 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 Folate deficiency 08/12/2015  . Schizophrenia (HCC) 04/30/2015  . Benign paroxysmal positional vertigo 03/04/2015  . Cervicalgia 05/06/2013  . Myofascial muscle pain 02/06/2013  . Sacroiliitis (HCC) 07/09/2012  . Idiopathic scoliosis 06/12/2012  . Lumbar spondylosis with myelopathy 06/12/2012  . Osteopenia 06/12/2012    Past Surgical History:  Procedure Laterality Date  . BREAST SURGERY    . MOUTH SURGERY      OB History    No data available       Home Medications    Prior to Admission medications    Medication Sig Start Date End Date Taking? Authorizing Provider  hydrOXYzine (ATARAX/VISTARIL) 50 MG tablet Take 50 mg by mouth 3 (three) times daily as needed for nausea.    [provider]  ondansetron (ZOFRAN ODT) 4 MG disintegrating tablet Take 1 tablet (4 mg total) by mouth every 4 (four) hours as needed for nausea or vomiting. 10/17/16   Arby BarrettePfeiffer, Cai Anfinson, MD  Probiotic Product (PROBIOTIC-10) CHEW Chew 1 tablet by mouth daily.    [provider]  promethazine (PHENERGAN) 25 MG suppository Place 1 suppository (25 mg total) rectally every 6 (six) hours as needed for nausea or vomiting. 07/24/16   Alvira MondaySchlossman, Erin, MD  promethazine (PHENERGAN) 25 MG suppository Place 1 suppository (25 mg total) rectally every 6 (six) hours as needed for nausea or vomiting. 10/17/16   Arby BarrettePfeiffer, Maysun Meditz, MD  ranitidine (ZANTAC) 150 MG capsule Take 1 capsule (150 mg total) by mouth daily. 07/24/16   Alvira MondaySchlossman, Erin, MD  tiZANidine (ZANAFLEX) 4 MG tablet Take 4 mg by mouth at bedtime.    [provider]  VRAYLAR 3 MG CAPS Take 3 mg by mouth daily.  05/02/16   [provider]    Family History Family History  Problem Relation Age of Onset  . Thyroid disease Mother   . Asthma Mother   . Mental illness Father   . Hyperlipidemia Father   . Healthy Brother   . Healthy Daughter   . Heart disease Paternal  Grandfather   . Heart disease Paternal Grandmother     Social History Social History  Substance Use Topics  . Smoking status: Current Every Day Smoker    Packs/day: 0.25    Years: 16.00    Types: Cigarettes  . Smokeless tobacco: Never Used  . Alcohol use 0.0 oz/week     Comment: One wine cooler per month     Allergies   Lavender oil   Review of Systems Review of Systems 10 Systems reviewed and are negative for acute change except as noted in the HPI.   Physical Exam Updated Vital Signs BP 128/76   Pulse (!) 54   Temp 98 F (36.7 C) (Oral)   Resp 20   SpO2 98%     Physical Exam  Constitutional: She is oriented to person, place, and time. She appears well-developed and well-nourished. No distress.  Patient is moderately uncomfortable. She is holding an emesis basin. Mental status is clear. No respiratory distress.  HENT:  Head: Normocephalic and atraumatic.  Mucous memory slightly dry.  Eyes: Conjunctivae and EOM are normal.  Neck: Neck supple.  Cardiovascular: Normal rate and regular rhythm.   No murmur heard. Pulmonary/Chest: Effort normal and breath sounds normal. No respiratory distress.  Abdominal: Soft. There is tenderness.  Diffuse mild tenderness to palpation without guarding  Musculoskeletal: Normal range of motion. She exhibits no edema or tenderness.  Neurological: She is alert and oriented to person, place, and time. No cranial nerve deficit. She exhibits normal muscle tone. Coordination normal.  Skin: Skin is warm and dry.  Psychiatric: She has a normal mood and affect.  Nursing note and vitals reviewed.    ED Treatments / Results  Labs (all labs ordered are listed, but only abnormal results are displayed) Labs Reviewed  COMPREHENSIVE METABOLIC PANEL - Abnormal; Notable for the following:       Result Value   Potassium 3.4 (*)    CO2 19 (*)    Glucose, Bld 147 (*)    BUN <5 (*)    Total Protein 6.3 (*)    ALT 10 (*)    All other components within normal limits  CBC - Abnormal; Notable for the following:    WBC 14.5 (*)    All other components within normal limits  LIPASE, BLOOD  URINALYSIS, ROUTINE W REFLEX MICROSCOPIC    EKG  EKG Interpretation None       Radiology No results found.  Procedures Procedures (including critical care time)  Medications Ordered in ED Medications  capsaicin (ZOSTRIX) 0.025 % cream ( Topical Given 10/17/16 1040)  sodium chloride 0.9 % bolus 1,000 mL (1,000 mLs Intravenous New Bag/Given 10/17/16 1201)  sodium chloride 0.9 % bolus 1,000 mL (1,000 mLs Intravenous New Bag/Given  10/17/16 1019)  pantoprazole (PROTONIX) injection 40 mg (40 mg Intravenous Given 10/17/16 1017)  ondansetron (ZOFRAN) injection 4 mg (4 mg Intravenous Given 10/17/16 1015)  potassium chloride 10 mEq in 100 mL IVPB (10 mEq Intravenous New Bag/Given 10/17/16 1058)  haloperidol lactate (HALDOL) injection 5 mg (5 mg Intravenous Given 10/17/16 1058)     Initial Impression / Assessment and Plan / ED Course  I have reviewed the triage vital signs and the nursing notes.  Pertinent labs & imaging results that were available during my care of the patient were reviewed by me and considered in my medical decision making (see chart for details).     Final Clinical Impressions(s) / ED Diagnoses   Final diagnoses:  Intractable cyclical  vomiting with nausea  Marijuana abuse   Patient has history of recurrent episodes of intractable vomiting. She has seen gastroenterology. At this time, patient's vital signs are stable without hypotension or tachycardia. She has been rehydrated with fluids. Patient does also have history of ongoing marijuana use although she reports she is cutting back. This may very well be precipitating episodes of cyclic vomiting. Patient has been treated with antiemetics, capsaicin and Haldol. Patient is counseled to follow-up with her primary care physician and her gastroenterologist. New Prescriptions New Prescriptions   ONDANSETRON (ZOFRAN ODT) 4 MG DISINTEGRATING TABLET    Take 1 tablet (4 mg total) by mouth every 4 (four) hours as needed for nausea or vomiting.   PROMETHAZINE (PHENERGAN) 25 MG SUPPOSITORY    Place 1 suppository (25 mg total) rectally every 6 (six) hours as needed for nausea or vomiting.     Arby Barrette, MD 10/17/16 1218

## 2016-10-17 NOTE — ED Notes (Signed)
MD informed she is not feeling better.

## 2016-10-17 NOTE — ED Triage Notes (Signed)
To ED for eval of vomiting and abd pain since this am at 0300. Pt states has hx of same.

## 2016-10-17 NOTE — ED Notes (Signed)
Pt states she is sick of wearing all the wires and she wants to go home.  MD notified.

## 2016-10-17 NOTE — ED Notes (Signed)
Instructed pt to remain NPO; mouth swab and water given.

## 2016-12-14 ENCOUNTER — Encounter (HOSPITAL_COMMUNITY): Payer: Self-pay | Admitting: Emergency Medicine

## 2016-12-14 DIAGNOSIS — F17210 Nicotine dependence, cigarettes, uncomplicated: Secondary | ICD-10-CM | POA: Diagnosis not present

## 2016-12-14 DIAGNOSIS — G43A0 Cyclical vomiting, not intractable: Secondary | ICD-10-CM | POA: Diagnosis not present

## 2016-12-14 DIAGNOSIS — Z79899 Other long term (current) drug therapy: Secondary | ICD-10-CM | POA: Diagnosis not present

## 2016-12-14 DIAGNOSIS — R0789 Other chest pain: Secondary | ICD-10-CM | POA: Insufficient documentation

## 2016-12-14 DIAGNOSIS — F1210 Cannabis abuse, uncomplicated: Secondary | ICD-10-CM | POA: Insufficient documentation

## 2016-12-14 DIAGNOSIS — R1013 Epigastric pain: Secondary | ICD-10-CM | POA: Diagnosis present

## 2016-12-14 DIAGNOSIS — R197 Diarrhea, unspecified: Secondary | ICD-10-CM | POA: Insufficient documentation

## 2016-12-14 DIAGNOSIS — J45909 Unspecified asthma, uncomplicated: Secondary | ICD-10-CM | POA: Diagnosis not present

## 2016-12-14 LAB — URINALYSIS, ROUTINE W REFLEX MICROSCOPIC
BILIRUBIN URINE: NEGATIVE
Glucose, UA: NEGATIVE mg/dL
Ketones, ur: 5 mg/dL — AB
Leukocytes, UA: NEGATIVE
Nitrite: NEGATIVE
Protein, ur: 30 mg/dL — AB
Specific Gravity, Urine: 1.018 (ref 1.005–1.030)
pH: 5 (ref 5.0–8.0)

## 2016-12-14 LAB — CBC
HEMATOCRIT: 39.5 % (ref 36.0–46.0)
Hemoglobin: 13.5 g/dL (ref 12.0–15.0)
MCH: 31.8 pg (ref 26.0–34.0)
MCHC: 34.2 g/dL (ref 30.0–36.0)
MCV: 92.9 fL (ref 78.0–100.0)
Platelets: 454 10*3/uL — ABNORMAL HIGH (ref 150–400)
RBC: 4.25 MIL/uL (ref 3.87–5.11)
RDW: 12.9 % (ref 11.5–15.5)
WBC: 16.4 10*3/uL — ABNORMAL HIGH (ref 4.0–10.5)

## 2016-12-14 LAB — I-STAT BETA HCG BLOOD, ED (MC, WL, AP ONLY)

## 2016-12-14 NOTE — ED Triage Notes (Signed)
Pt c/o vomiting and abdominal pain that began at 1030 this am. Pt has tried zofran, and a phenegran suppository without relief.

## 2016-12-15 ENCOUNTER — Emergency Department (HOSPITAL_COMMUNITY)
Admission: EM | Admit: 2016-12-15 | Discharge: 2016-12-15 | Disposition: A | Discharge status: Home / Self Care | Payer: Medicaid Other | Attending: Emergency Medicine | Admitting: Emergency Medicine

## 2016-12-15 DIAGNOSIS — F1210 Cannabis abuse, uncomplicated: Secondary | ICD-10-CM

## 2016-12-15 DIAGNOSIS — R1115 Cyclical vomiting syndrome unrelated to migraine: Secondary | ICD-10-CM

## 2016-12-15 LAB — COMPREHENSIVE METABOLIC PANEL
ALT: 15 U/L (ref 14–54)
AST: 26 U/L (ref 15–41)
Albumin: 4 g/dL (ref 3.5–5.0)
Alkaline Phosphatase: 93 U/L (ref 38–126)
Anion gap: 11 (ref 5–15)
BILIRUBIN TOTAL: 0.6 mg/dL (ref 0.3–1.2)
BUN: 5 mg/dL — AB (ref 6–20)
CO2: 23 mmol/L (ref 22–32)
CREATININE: 0.89 mg/dL (ref 0.44–1.00)
Calcium: 9.6 mg/dL (ref 8.9–10.3)
Chloride: 103 mmol/L (ref 101–111)
Glucose, Bld: 142 mg/dL — ABNORMAL HIGH (ref 65–99)
POTASSIUM: 3.9 mmol/L (ref 3.5–5.1)
Sodium: 137 mmol/L (ref 135–145)
Total Protein: 7.3 g/dL (ref 6.5–8.1)

## 2016-12-15 LAB — LIPASE, BLOOD: LIPASE: 22 U/L (ref 11–51)

## 2016-12-15 MED ORDER — LORAZEPAM 2 MG/ML IJ SOLN
1.0000 mg | Freq: Once | INTRAMUSCULAR | Status: AC
  Administered 2016-12-15: 1 mg via INTRAVENOUS
  Filled 2016-12-15: qty 1

## 2016-12-15 MED ORDER — SODIUM CHLORIDE 0.9 % IV BOLUS (SEPSIS)
1000.0000 mL | Freq: Once | INTRAVENOUS | Status: AC
  Administered 2016-12-15: 1000 mL via INTRAVENOUS

## 2016-12-15 MED ORDER — HALOPERIDOL LACTATE 5 MG/ML IJ SOLN
5.0000 mg | Freq: Once | INTRAMUSCULAR | Status: AC
  Administered 2016-12-15: 5 mg via INTRAVENOUS
  Filled 2016-12-15: qty 1

## 2016-12-15 NOTE — ED Notes (Signed)
EDP notified on pt.'s anxiety attack .

## 2016-12-15 NOTE — ED Provider Notes (Signed)
[facility]-EMERGENCY DEPT Provider Note   CSN: [id number] Arrival date & time: 12/14/16  2304     History   Chief Complaint Chief Complaint  Patient presents with  . Abdominal Pain    HPI Laura Montoya is a 31 y.o. female.  HPI  31 year old female presents with recurrent epigastric abdominal pain and vomiting. She states this started less than 24 hours ago. She started off having some diarrhea but has not had a bowel movement in over 8 hours. No hematemesis. Abdominal pain feels like a pressure on her upper abdomen and somewhat into her chest. She states it feels like reflux. She's had this multiple times before. She states she does have a history of marijuana use and currently smokes 2-3 bowls per day. She states all of this is very similar to her multiple prior episodes. She's tried Zofran and Phenergan at home with no relief. Still nauseated and with epigastric pain. Haldol has worked in the past. No urinary symptoms.  Past Medical History:  Diagnosis Date  . Anxiety   . Asthma   . Dizziness   . Dysmenorrhea   . Headache   . Mood disorder (HCC)   . Schizophrenia (HCC)   . Scoliosis   . Thrush     Patient Active Problem List   Diagnosis Date Noted  . Prolonged QT interval 09/14/2015  . Hypomagnesemia 09/14/2015  . Hypokalemia 09/14/2015  . Vomiting and diarrhea 09/14/2015  . Tobacco use disorder 09/14/2015  . 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 Vitamin B12 deficiency 08/12/2015  . 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 Folate deficiency 08/12/2015  . Schizophrenia (HCC) 04/30/2015  . Benign paroxysmal positional vertigo 03/04/2015  . Cervicalgia 05/06/2013  . Myofascial muscle pain 02/06/2013  . Sacroiliitis (HCC) 07/09/2012  . Idiopathic scoliosis 06/12/2012  . Lumbar spondylosis with myelopathy 06/12/2012  . Osteopenia 06/12/2012    Past Surgical History:  Procedure Laterality Date  . BREAST SURGERY    . MOUTH SURGERY      OB  History    No data available       Home Medications    Prior to Admission medications   Medication Sig Start Date End Date Taking? Authorizing Provider  acetaminophen (TYLENOL) 325 MG tablet Take 650 mg by mouth every 6 (six) hours as needed for mild pain or fever.   Yes [provider]  hydrOXYzine (ATARAX/VISTARIL) 50 MG tablet Take 50 mg by mouth 3 (three) times daily as needed for nausea.   Yes [provider]  tiZANidine (ZANAFLEX) 4 MG tablet Take 4 mg by mouth at bedtime.   Yes [provider]  VRAYLAR 3 MG CAPS Take 3 mg by mouth daily.  05/02/16  Yes [provider]  ondansetron (ZOFRAN ODT) 4 MG disintegrating tablet Take 1 tablet (4 mg total) by mouth every 4 (four) hours as needed for nausea or vomiting. Patient not taking: Reported on 12/15/2016 10/17/16   Arby Barrette, MD  promethazine (PHENERGAN) 25 MG suppository Place 1 suppository (25 mg total) rectally every 6 (six) hours as needed for nausea or vomiting. Patient not taking: Reported on 12/15/2016 07/24/16   Alvira Monday, MD  promethazine (PHENERGAN) 25 MG suppository Place 1 suppository (25 mg total) rectally every 6 (six) hours as needed for nausea or vomiting. Patient not taking: Reported on 12/15/2016 10/17/16   Arby Barrette, MD  ranitidine (ZANTAC) 150 MG capsule Take 1 capsule (150 mg total) by mouth daily. Patient not taking: Reported on  12/15/2016 07/24/16   Alvira Monday, MD    Family History Family History  Problem Relation Age of Onset  . Thyroid disease Mother   . Asthma Mother   . Mental illness Father   . Hyperlipidemia Father   . Healthy Brother   . Healthy Daughter   . Heart disease Paternal Grandfather   . Heart disease Paternal Grandmother     Social History Social History  Substance Use Topics  . Smoking status: Current Every Day Smoker    Packs/day: 0.25    Years: 16.00    Types: Cigarettes  . Smokeless tobacco: Never Used  . Alcohol use 0.0  oz/week     Comment: One wine cooler per month     Allergies   Lavender oil   Review of Systems Review of Systems  Constitutional: Negative for fever.  Respiratory: Negative for shortness of breath.   Cardiovascular: Positive for chest pain.  Gastrointestinal: Positive for abdominal pain, diarrhea, nausea and vomiting.  Genitourinary: Negative for dysuria.  All other systems reviewed and are negative.    Physical Exam Updated Vital Signs BP (!) 107/58   Pulse (!) 117   Temp 97.6 F (36.4 C) (Oral)   Resp (!) 23   Ht 5\' 2"  (1.575 m)   Wt 80.3 kg (177 lb)   LMP 12/11/2016   SpO2 97%   BMI 32.37 kg/m   Physical Exam  Constitutional: She is oriented to person, place, and time. She appears well-developed and well-nourished.  HENT:  Head: Normocephalic and atraumatic.  Right Ear: External ear normal.  Left Ear: External ear normal.  Nose: Nose normal.  Eyes: Right eye exhibits no discharge. Left eye exhibits no discharge.  Cardiovascular: Normal rate, regular rhythm and normal heart sounds.   Pulmonary/Chest: Effort normal and breath sounds normal.  Abdominal: Soft. She exhibits no distension. There is tenderness in the epigastric area and left upper quadrant.  Neurological: She is alert and oriented to person, place, and time.  Skin: Skin is warm and dry. She is not diaphoretic.  Nursing note and vitals reviewed.    ED Treatments / Results  Labs (all labs ordered are listed, but only abnormal results are displayed) Labs Reviewed  COMPREHENSIVE METABOLIC PANEL - Abnormal; Notable for the following:       Result Value   Glucose, Bld 142 (*)    BUN 5 (*)    All other components within normal limits  CBC - Abnormal; Notable for the following:    WBC 16.4 (*)    Platelets 454 (*)    All other components within normal limits  URINALYSIS, ROUTINE W REFLEX MICROSCOPIC - Abnormal; Notable for the following:    APPearance TURBID (*)    Hgb urine dipstick SMALL (*)      Ketones, ur 5 (*)    Protein, ur 30 (*)    Bacteria, UA RARE (*)    Squamous Epithelial / LPF 0-5 (*)    All other components within normal limits  LIPASE, BLOOD  I-STAT BETA HCG BLOOD, ED ([facility], [facility], [facility] ONLY)    EKG  EKG Interpretation  Date/Time:  Thursday December 15 2016 01:52:19 EDT Ventricular Rate:  74 PR Interval:    QRS Duration: 115 QT Interval:  428 QTC Calculation: 475 R Axis:   91 Text Interpretation:  sinus rhythm Incomplete right bundle branch block no significant change compared to April 2018 Confirmed by Pricilla Loveless [phone]) on 12/15/2016 2:03:35 AM Also confirmed by Pricilla Loveless [phone]),  editor Elita Quick ([id number])  on 12/15/2016 6:47:06 AM       Radiology No results found.  Procedures Procedures (including critical care time)  Medications Ordered in ED Medications  sodium chloride 0.9 % bolus 1,000 mL (0 mLs Intravenous Stopped 12/15/16 0218)  haloperidol lactate (HALDOL) injection 5 mg (5 mg Intravenous Given 12/15/16 0158)  LORazepam (ATIVAN) injection 1 mg (1 mg Intravenous Given 12/15/16 0315)     Initial Impression / Assessment and Plan / ED Course  I have reviewed the triage vital signs and the nursing notes.  Pertinent labs & imaging results that were available during my care of the patient were reviewed by me and considered in my medical decision making (see chart for details).     Patient is no longer vomiting. Appears to be recurrent cyclic vomiting, likely from heavy marijuana use. Doubt other pathology or significant intra-abd pathology. No indication for imaging. She is now anxious after haldol with some tremors, given IV ativan with good relief. Now taking PO. D/c home with return precautions. Counseled on stopping marijuana.   Final Clinical Impressions(s) / ED Diagnoses   Final diagnoses:  Non-intractable cyclical vomiting with nausea  Marijuana abuse, continuous    New Prescriptions Discharge Medication List as of  12/15/2016  4:33 AM       Pricilla Loveless, MD 12/15/16 [phone]

## 2017-02-11 ENCOUNTER — Encounter (HOSPITAL_COMMUNITY): Payer: Self-pay

## 2017-02-11 ENCOUNTER — Emergency Department (HOSPITAL_COMMUNITY)
Admission: EM | Admit: 2017-02-11 | Discharge: 2017-02-12 | Disposition: A | Discharge status: Home / Self Care | Payer: Medicaid Other | Attending: Emergency Medicine | Admitting: Emergency Medicine

## 2017-02-11 ENCOUNTER — Emergency Department (HOSPITAL_COMMUNITY)
Admission: EM | Admit: 2017-02-11 | Discharge: 2017-02-11 | Disposition: A | Discharge status: Home / Self Care | Payer: Medicaid Other | Attending: Emergency Medicine | Admitting: Emergency Medicine

## 2017-02-11 ENCOUNTER — Encounter (HOSPITAL_COMMUNITY): Payer: Self-pay | Admitting: Emergency Medicine

## 2017-02-11 ENCOUNTER — Emergency Department (HOSPITAL_COMMUNITY): Payer: Medicaid Other

## 2017-02-11 DIAGNOSIS — F419 Anxiety disorder, unspecified: Secondary | ICD-10-CM | POA: Insufficient documentation

## 2017-02-11 DIAGNOSIS — Z5321 Procedure and treatment not carried out due to patient leaving prior to being seen by health care provider: Secondary | ICD-10-CM | POA: Insufficient documentation

## 2017-02-11 DIAGNOSIS — R1011 Right upper quadrant pain: Secondary | ICD-10-CM

## 2017-02-11 DIAGNOSIS — F17210 Nicotine dependence, cigarettes, uncomplicated: Secondary | ICD-10-CM | POA: Insufficient documentation

## 2017-02-11 DIAGNOSIS — R112 Nausea with vomiting, unspecified: Secondary | ICD-10-CM | POA: Insufficient documentation

## 2017-02-11 DIAGNOSIS — Z79899 Other long term (current) drug therapy: Secondary | ICD-10-CM | POA: Diagnosis not present

## 2017-02-11 DIAGNOSIS — R197 Diarrhea, unspecified: Secondary | ICD-10-CM | POA: Insufficient documentation

## 2017-02-11 DIAGNOSIS — R1110 Vomiting, unspecified: Secondary | ICD-10-CM | POA: Diagnosis present

## 2017-02-11 HISTORY — DX: Gastro-esophageal reflux disease without esophagitis: K21.9

## 2017-02-11 LAB — COMPREHENSIVE METABOLIC PANEL
ALBUMIN: 3.8 g/dL (ref 3.5–5.0)
ALBUMIN: 3.7 g/dL (ref 3.5–5.0)
ALT: 11 U/L — ABNORMAL LOW (ref 14–54)
ALT: 12 U/L — ABNORMAL LOW (ref 14–54)
ANION GAP: 13 (ref 5–15)
AST: 20 U/L (ref 15–41)
AST: 18 U/L (ref 15–41)
Alkaline Phosphatase: 95 U/L (ref 38–126)
Alkaline Phosphatase: 102 U/L (ref 38–126)
Anion gap: 14 (ref 5–15)
BILIRUBIN TOTAL: 0.5 mg/dL (ref 0.3–1.2)
BUN: <5 mg/dL — ABNORMAL LOW (ref 6–20)
BUN: 5 mg/dL — AB (ref 6–20)
CALCIUM: 9.1 mg/dL (ref 8.9–10.3)
CHLORIDE: 98 mmol/L — AB (ref 101–111)
CO2: 22 mmol/L (ref 22–32)
CO2: 24 mmol/L (ref 22–32)
Calcium: 9.2 mg/dL (ref 8.9–10.3)
Chloride: 100 mmol/L — ABNORMAL LOW (ref 101–111)
Creatinine, Ser: 0.72 mg/dL (ref 0.44–1.00)
Creatinine, Ser: 0.72 mg/dL (ref 0.44–1.00)
GFR calc Af Amer: >60 mL/min (ref >60)
GFR calc non Af Amer: >60 mL/min (ref >60)
GLUCOSE: 124 mg/dL — AB (ref 65–99)
GLUCOSE: 138 mg/dL — AB (ref 65–99)
POTASSIUM: 3.6 mmol/L (ref 3.5–5.1)
POTASSIUM: 3.5 mmol/L (ref 3.5–5.1)
Sodium: 135 mmol/L (ref 135–145)
Sodium: 136 mmol/L (ref 135–145)
TOTAL PROTEIN: 6.7 g/dL (ref 6.5–8.1)
Total Bilirubin: 0.3 mg/dL (ref 0.3–1.2)
Total Protein: 6.9 g/dL (ref 6.5–8.1)

## 2017-02-11 LAB — URINALYSIS, ROUTINE W REFLEX MICROSCOPIC
Bilirubin Urine: NEGATIVE
Bilirubin Urine: NEGATIVE
GLUCOSE, UA: NEGATIVE mg/dL
Glucose, UA: NEGATIVE mg/dL
HGB URINE DIPSTICK: NEGATIVE
Hgb urine dipstick: NEGATIVE
KETONES UR: NEGATIVE mg/dL
Ketones, ur: 20 mg/dL — AB
LEUKOCYTES UA: NEGATIVE
LEUKOCYTES UA: NEGATIVE
NITRITE: NEGATIVE
Nitrite: NEGATIVE
PH: 5 (ref 5.0–8.0)
PROTEIN: 30 mg/dL — AB
Protein, ur: 30 mg/dL — AB
SPECIFIC GRAVITY, URINE: 1.018 (ref 1.005–1.030)
Specific Gravity, Urine: 1.01 (ref 1.005–1.030)
pH: 6 (ref 5.0–8.0)

## 2017-02-11 LAB — CBC
HCT: 37.4 % (ref 36.0–46.0)
HEMATOCRIT: 39.6 % (ref 36.0–46.0)
HEMOGLOBIN: 13.8 g/dL (ref 12.0–15.0)
Hemoglobin: 13.1 g/dL (ref 12.0–15.0)
MCH: 32.2 pg (ref 26.0–34.0)
MCH: 32.3 pg (ref 26.0–34.0)
MCHC: 34.8 g/dL (ref 30.0–36.0)
MCHC: 35 g/dL (ref 30.0–36.0)
MCV: 92.5 fL (ref 78.0–100.0)
MCV: 92.1 fL (ref 78.0–100.0)
PLATELETS: 485 10*3/uL — AB (ref 150–400)
Platelets: 466 10*3/uL — ABNORMAL HIGH (ref 150–400)
RBC: 4.28 MIL/uL (ref 3.87–5.11)
RBC: 4.06 MIL/uL (ref 3.87–5.11)
RDW: 12.8 % (ref 11.5–15.5)
RDW: 12.8 % (ref 11.5–15.5)
WBC: 18.8 10*3/uL — AB (ref 4.0–10.5)
WBC: 18.9 10*3/uL — AB (ref 4.0–10.5)

## 2017-02-11 LAB — LIPASE, BLOOD
Lipase: 17 U/L (ref 11–51)
Lipase: 16 U/L (ref 11–51)

## 2017-02-11 LAB — HCG, QUANTITATIVE, PREGNANCY

## 2017-02-11 MED ORDER — SODIUM CHLORIDE 0.9 % IV BOLUS (SEPSIS)
1000.0000 mL | Freq: Once | INTRAVENOUS | Status: AC
  Administered 2017-02-11: 1000 mL via INTRAVENOUS

## 2017-02-11 MED ORDER — HALOPERIDOL LACTATE 5 MG/ML IJ SOLN
4.0000 mg | Freq: Once | INTRAMUSCULAR | Status: AC
  Administered 2017-02-11: 4 mg via INTRAVENOUS
  Filled 2017-02-11: qty 1

## 2017-02-11 MED ORDER — LORAZEPAM 2 MG/ML IJ SOLN
2.0000 mg | Freq: Once | INTRAMUSCULAR | Status: AC
  Administered 2017-02-11: 2 mg via INTRAVENOUS
  Filled 2017-02-11: qty 1

## 2017-02-11 MED ORDER — FAMOTIDINE IN NACL 20-0.9 MG/50ML-% IV SOLN
20.0000 mg | Freq: Once | INTRAVENOUS | Status: AC
  Administered 2017-02-11: 20 mg via INTRAVENOUS
  Filled 2017-02-11: qty 50

## 2017-02-11 NOTE — ED Notes (Signed)
Patient aware urine is needed states she tried several times earlier and is very dehydrated. Pt will try after receiving some iv fluids.

## 2017-02-11 NOTE — ED Notes (Signed)
Pt unable to provide urine specimen at this time. She stated, "I tried to urinate 4 or 5 times in the bathroom."

## 2017-02-11 NOTE — ED Provider Notes (Signed)
[facility]-EMERGENCY DEPT Provider Note   CSN: [id number] Arrival date & time: 02/11/17  1534     History   Chief Complaint Chief Complaint  Patient presents with  . Abdominal Pain  . Emesis    HPI Juanisha Romey Cohea is a 31 y.o. female who presents with acute onset of nausea and vomiting. PMH significant for cyclical vomiting, gastritis, anxiety, schizophrenia, prolonged QT. She states that her symptoms acutely started this morning at 9:30AM. She took Zofran which she has at home with no relief. She reports she has vomiting >10 times and now has some blood streaks in the vomit. Also had some non-bloody diarrhea this morning which has resolved. No fever, chills, chest pain, SOB, lower abdominal pain, dysuria, frequency, vaginal discharge. No prior abdominal surgeries. She has had an EGD which has showed severe gastritis in the past and cyclical vomiting was thought to be due to marijuana use. She states that she is still smoking marijuana but has cut back. CT in April 2018 was negative.   HPI  Past Medical History:  Diagnosis Date  . Anxiety   . Asthma   . Dizziness   . Dysmenorrhea   . GERD (gastroesophageal reflux disease)   . Headache   . Mood disorder (HCC)   . Schizophrenia (HCC)   . Scoliosis   . Thrush     Patient Active Problem List   Diagnosis Date Noted  . Prolonged QT interval 09/14/2015  . Hypomagnesemia 09/14/2015  . Hypokalemia 09/14/2015  . Tobacco use disorder 09/14/2015  . 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 Vitamin B12 deficiency 08/12/2015  . 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 Folate deficiency 08/12/2015  . Schizophrenia (HCC) 04/30/2015  . Benign paroxysmal positional vertigo 03/04/2015  . Cervicalgia 05/06/2013  . Myofascial muscle pain 02/06/2013  . Sacroiliitis (HCC) 07/09/2012  . Idiopathic scoliosis 06/12/2012  . Lumbar spondylosis with myelopathy 06/12/2012  . Osteopenia 06/12/2012     Past Surgical History:  Procedure Laterality Date  . BREAST SURGERY    . MOUTH SURGERY      OB History    No data available       Home Medications    Prior to Admission medications   Medication Sig Start Date End Date Taking? Authorizing Provider  acetaminophen (TYLENOL) 325 MG tablet Take 650 mg by mouth every 6 (six) hours as needed for mild pain or fever.    [provider]  hydrOXYzine (ATARAX/VISTARIL) 50 MG tablet Take 50 mg by mouth 3 (three) times daily as needed for nausea.    [provider]  ondansetron (ZOFRAN ODT) 4 MG disintegrating tablet Take 1 tablet (4 mg total) by mouth every 4 (four) hours as needed for nausea or vomiting. Patient not taking: Reported on 12/15/2016 10/17/16   Arby Barrette, MD  promethazine (PHENERGAN) 25 MG suppository Place 1 suppository (25 mg total) rectally every 6 (six) hours as needed for nausea or vomiting. Patient not taking: Reported on 12/15/2016 07/24/16   Alvira Monday, MD  promethazine (PHENERGAN) 25 MG suppository Place 1 suppository (25 mg total) rectally every 6 (six) hours as needed for nausea or vomiting. Patient not taking: Reported on 12/15/2016 10/17/16   Arby Barrette, MD  ranitidine (ZANTAC) 150 MG capsule Take 1 capsule (150 mg total) by mouth daily. Patient not taking: Reported on 12/15/2016 07/24/16   Alvira Monday, MD  tiZANidine (ZANAFLEX) 4 MG tablet Take 4 mg by mouth at bedtime.    [provider]  VRAYLAR 3 MG CAPS Take 3 mg by mouth daily.  05/02/16   [provider]    Family History Family History  Problem Relation Age of Onset  . Thyroid disease Mother   . Asthma Mother   . Mental illness Father   . Hyperlipidemia Father   . Healthy Brother   . Healthy Daughter   . Heart disease Paternal Grandfather   . Heart disease Paternal Grandmother     Social History Social History  Substance Use Topics  . Smoking status: Current Every Day Smoker    Packs/day: 0.25     Years: 16.00    Types: Cigarettes  . Smokeless tobacco: Never Used  . Alcohol use 0.0 oz/week     Comment: One wine cooler per month     Allergies   Lavender oil   Review of Systems Review of Systems  Constitutional: Negative for chills and fever.  Respiratory: Negative for shortness of breath.   Cardiovascular: Negative for chest pain.  Gastrointestinal: Positive for abdominal pain, diarrhea, nausea and vomiting.  Genitourinary: Negative for dysuria, frequency, pelvic pain and vaginal discharge.  All other systems reviewed and are negative.    Physical Exam Updated Vital Signs BP (!) 142/72 (BP Location: Left Arm)   Pulse 66   Temp 97.7 F (36.5 C) (Oral)   Resp 15   LMP 01/13/2017 (Approximate)   SpO2 98%   Physical Exam  Constitutional: She is oriented to person, place, and time. She appears well-developed and well-nourished. She appears distressed (anxious and tremulous).  HENT:  Head: Normocephalic and atraumatic.  Eyes: Pupils are equal, round, and reactive to light. Conjunctivae are normal. Right eye exhibits no discharge. Left eye exhibits no discharge. No scleral icterus.  Neck: Normal range of motion.  Cardiovascular: Normal rate and regular rhythm.  Exam reveals no gallop and no friction rub.   No murmur heard. Pulmonary/Chest: Effort normal and breath sounds normal. No respiratory distress. She has no wheezes. She has no rales. She exhibits no tenderness.  Abdominal: Soft. Bowel sounds are normal. She exhibits no distension and no mass. There is tenderness (RUQ and epigastric tenderness). There is no rebound and no guarding. No hernia.  Neurological: She is alert and oriented to person, place, and time.  Skin: Skin is warm and dry.  Psychiatric: She has a normal mood and affect. Her behavior is normal.  Nursing note and vitals reviewed.    ED Treatments / Results  Labs (all labs ordered are listed, but only abnormal results are displayed) Labs Reviewed   COMPREHENSIVE METABOLIC PANEL - Abnormal; Notable for the following:       Result Value   Chloride 98 (*)    Glucose, Bld 138 (*)    BUN 5 (*)    ALT 12 (*)    All other components within normal limits  CBC - Abnormal; Notable for the following:    WBC 18.9 (*)    Platelets 485 (*)    All other components within normal limits  LIPASE, BLOOD  HCG, QUANTITATIVE, PREGNANCY  URINALYSIS, ROUTINE W REFLEX MICROSCOPIC    EKG  EKG Interpretation None       Radiology No results found.  Procedures Procedures (including critical care time)  Medications Ordered in ED Medications  sodium chloride 0.9 % bolus 1,000 mL (1,000 mLs Intravenous New Bag/Given 02/11/17 2010)  haloperidol lactate (HALDOL) injection 4 mg (4 mg Intravenous Given 02/11/17 2007)  famotidine (PEPCID) IVPB 20 mg premix (  0 mg Intravenous Stopped 02/11/17 2109)  LORazepam (ATIVAN) injection 2 mg (2 mg Intravenous Given 02/11/17 2135)     Initial Impression / Assessment and Plan / ED Course  I have reviewed the triage vital signs and the nursing notes.  Pertinent labs & imaging results that were available during my care of the patient were reviewed by me and considered in my medical decision making (see chart for details).  31 year old female with abdominal pain, N/V/D. Vitals are normal although she is tachypenic at times due to anxiety. CBC remarkable for leukocytosis of 18.9. She commonly has leukocytosis. CMp is overall unremarkable. UA is pending. UA from yesterday had many bacteria and 6-30 WBC but appeared contaminated. She does have RUQ tenderness. Will order RUQ US, fluids, Haldol, Pepcid and reassess.  9:30PM: Nursing staff has notified me patient is feeling anxious. Will order Ativan.  10:39 PM RUQ US is negative. She is sleeping after Ativan. PO challenge tolerated. She has Zofran and Phenergan suppositories at home. Will d/c.   Final Clinical Impressions(s) / ED Diagnoses   Final diagnoses:  RUQ  pain  Nausea and vomiting, intractability of vomiting not specified, unspecified vomiting type    New Prescriptions New Prescriptions   No medications on file     Bethel BornGekas, Aubreana Cornacchia Marie, PA-C 02/12/17 1051    Long, Arlyss RepressJoshua G, MD 02/12/17 1352

## 2017-02-11 NOTE — ED Notes (Signed)
Patient given ice chips per provider request.

## 2017-02-11 NOTE — ED Notes (Signed)
Patient decided to leave, she did not want to wait any longer. I asked her if she would still wait and she said no, she was leaving.

## 2017-02-11 NOTE — ED Triage Notes (Signed)
Pt presents with onset of epigastric pain with nausea, vomiting and diarrhea that began today.

## 2017-02-11 NOTE — ED Notes (Signed)
Patient offered zofran in triage, reports she took some this am with no relief.

## 2017-02-11 NOTE — ED Triage Notes (Signed)
Patient presents with uncontrollable vomiting onset of this morning. Pt also had episodes of diarrhea this am. Patient crying in triage of epigastric pain. Pt states she usually requires haldol, ativan and fluids.Pt dry heaving in triage.

## 2017-02-18 ENCOUNTER — Emergency Department (HOSPITAL_COMMUNITY)
Admission: EM | Admit: 2017-02-18 | Discharge: 2017-02-18 | Disposition: A | Discharge status: Home / Self Care | Payer: Medicaid Other | Attending: Emergency Medicine | Admitting: Emergency Medicine

## 2017-02-18 ENCOUNTER — Encounter (HOSPITAL_COMMUNITY): Payer: Self-pay | Admitting: Emergency Medicine

## 2017-02-18 DIAGNOSIS — F419 Anxiety disorder, unspecified: Secondary | ICD-10-CM | POA: Diagnosis not present

## 2017-02-18 DIAGNOSIS — F1290 Cannabis use, unspecified, uncomplicated: Secondary | ICD-10-CM | POA: Insufficient documentation

## 2017-02-18 DIAGNOSIS — F209 Schizophrenia, unspecified: Secondary | ICD-10-CM | POA: Diagnosis not present

## 2017-02-18 DIAGNOSIS — R1013 Epigastric pain: Secondary | ICD-10-CM | POA: Diagnosis not present

## 2017-02-18 DIAGNOSIS — R112 Nausea with vomiting, unspecified: Secondary | ICD-10-CM | POA: Diagnosis not present

## 2017-02-18 DIAGNOSIS — R197 Diarrhea, unspecified: Secondary | ICD-10-CM | POA: Insufficient documentation

## 2017-02-18 DIAGNOSIS — F17210 Nicotine dependence, cigarettes, uncomplicated: Secondary | ICD-10-CM | POA: Diagnosis not present

## 2017-02-18 DIAGNOSIS — E876 Hypokalemia: Secondary | ICD-10-CM

## 2017-02-18 DIAGNOSIS — J45909 Unspecified asthma, uncomplicated: Secondary | ICD-10-CM | POA: Insufficient documentation

## 2017-02-18 DIAGNOSIS — Z79899 Other long term (current) drug therapy: Secondary | ICD-10-CM | POA: Insufficient documentation

## 2017-02-18 LAB — CBC
HCT: 38.5 % (ref 36.0–46.0)
Hemoglobin: 13.5 g/dL (ref 12.0–15.0)
MCH: 32.4 pg (ref 26.0–34.0)
MCHC: 35.1 g/dL (ref 30.0–36.0)
MCV: 92.3 fL (ref 78.0–100.0)
PLATELETS: 431 10*3/uL — AB (ref 150–400)
RBC: 4.17 MIL/uL (ref 3.87–5.11)
RDW: 13.1 % (ref 11.5–15.5)
WBC: 16.1 10*3/uL — AB (ref 4.0–10.5)

## 2017-02-18 LAB — COMPREHENSIVE METABOLIC PANEL
ALT: 14 U/L (ref 14–54)
AST: 19 U/L (ref 15–41)
Albumin: 3.8 g/dL (ref 3.5–5.0)
Alkaline Phosphatase: 85 U/L (ref 38–126)
Anion gap: 11 (ref 5–15)
CHLORIDE: 101 mmol/L (ref 101–111)
CO2: 26 mmol/L (ref 22–32)
CREATININE: 0.82 mg/dL (ref 0.44–1.00)
Calcium: 9.2 mg/dL (ref 8.9–10.3)
GFR calc Af Amer: >60 mL/min (ref >60)
Glucose, Bld: 118 mg/dL — ABNORMAL HIGH (ref 65–99)
Potassium: 2.9 mmol/L — ABNORMAL LOW (ref 3.5–5.1)
Sodium: 138 mmol/L (ref 135–145)
Total Bilirubin: 0.5 mg/dL (ref 0.3–1.2)
Total Protein: 6.6 g/dL (ref 6.5–8.1)

## 2017-02-18 LAB — URINALYSIS, ROUTINE W REFLEX MICROSCOPIC
BILIRUBIN URINE: NEGATIVE
GLUCOSE, UA: NEGATIVE mg/dL
HGB URINE DIPSTICK: NEGATIVE
KETONES UR: NEGATIVE mg/dL
Leukocytes, UA: NEGATIVE
Nitrite: NEGATIVE
PROTEIN: NEGATIVE mg/dL
Specific Gravity, Urine: 1.002 — ABNORMAL LOW (ref 1.005–1.030)
pH: 6 (ref 5.0–8.0)

## 2017-02-18 LAB — LIPASE, BLOOD: LIPASE: 18 U/L (ref 11–51)

## 2017-02-18 MED ORDER — LORAZEPAM 1 MG PO TABS
2.0000 mg | ORAL_TABLET | Freq: Once | ORAL | Status: AC
  Administered 2017-02-18: 2 mg via ORAL
  Filled 2017-02-18: qty 2

## 2017-02-18 MED ORDER — FAMOTIDINE 20 MG PO TABS
40.0000 mg | ORAL_TABLET | Freq: Once | ORAL | Status: AC
  Administered 2017-02-18: 40 mg via ORAL
  Filled 2017-02-18: qty 2

## 2017-02-18 MED ORDER — HALOPERIDOL LACTATE 5 MG/ML IJ SOLN
5.0000 mg | Freq: Once | INTRAMUSCULAR | Status: AC
  Administered 2017-02-18: 5 mg via INTRAVENOUS
  Filled 2017-02-18: qty 1

## 2017-02-18 MED ORDER — ONDANSETRON 4 MG PO TBDP: 4.0000 mg | ORAL_TABLET | Freq: Once | ORAL | Status: DC | PRN

## 2017-02-18 MED ORDER — RANITIDINE HCL 150 MG PO TABS: 150.0000 mg | ORAL_TABLET | Freq: Two times a day (BID) | ORAL | 0 refills | Status: DC

## 2017-02-18 MED ORDER — POTASSIUM CHLORIDE CRYS ER 20 MEQ PO TBCR
40.0000 meq | EXTENDED_RELEASE_TABLET | Freq: Once | ORAL | Status: AC
  Administered 2017-02-18: 40 meq via ORAL
  Filled 2017-02-18: qty 2

## 2017-02-18 MED ORDER — SODIUM CHLORIDE 0.9 % IV BOLUS (SEPSIS)
1000.0000 mL | Freq: Once | INTRAVENOUS | Status: AC
  Administered 2017-02-18: 1000 mL via INTRAVENOUS

## 2017-02-18 MED ORDER — PROMETHAZINE HCL 25 MG RE SUPP: 25.0000 mg | Freq: Four times a day (QID) | RECTAL | 0 refills | Status: DC | PRN

## 2017-02-18 NOTE — ED Notes (Signed)
On this nurse's arrival to room pt asked "do you have my drugs?" first this nurse introduced myself, explained medication, pt asked that all ekg wires be removed, explained that they needed to stay on to monitor heart.

## 2017-02-18 NOTE — ED Notes (Signed)
Pt reports hx of cyclic vomiting, states she started vomiting this am despite taking gi meds. Pt calm, nad, not currently vomiting or gagging, but states that she normally gets "haldol and ativan" when she has these episodes.

## 2017-02-18 NOTE — ED Triage Notes (Addendum)
Pt c/o GERD flare up with abdominal pain Nausea and diarrhea onset today. Pt took zofran at home at 0900.

## 2017-02-18 NOTE — ED Notes (Signed)
Patient asking when she will be discharged, this RN advised patient that she needs to receive the rest of her IV fluids and be reevaluated for the nausea she reported to us earlier. Pt states "I feel better and will drink plenty of fluids at home, I am just ready to go." this RN advised I would let provider know.

## 2017-02-18 NOTE — Discharge Instructions (Signed)
Please follow up with your doctor regarding anxiety Please stop all marijuana use since this may cause your to start vomiting more Take Zantac for reflux Use suppositories for vomiting as needed Return for worsening symptoms

## 2017-02-18 NOTE — ED Provider Notes (Signed)
[facility] EMERGENCY DEPARTMENT Provider Note   CSN: [id number] Arrival date & time: 02/18/17  1051     History   Chief Complaint Chief Complaint  Patient presents with  . Emesis  . Abdominal Pain  . Diarrhea    HPI Laura Montoya is a 31 y.o. female who presents with N/V/D. PMH significant for cyclical vomiting, gastritis, anxiety, schizophrenia, prolonged QT, marijuana use. She states her vomiting started acutely this morning. She was last seen by me on 10/20 for the same. She reports epigastric abdominal pain, N/V/D. She also has worsening anxiety. She continues to use marijuana. She takes Hydroxyzine for anxiety at home and was started on Prozac two weeks ago. She is seen at [facility] for anxiety. No fever, chills, chest pain, SOB, urinary symptoms, vaginal discharge.  HPI  Past Medical History:  Diagnosis Date  . Anxiety   . Asthma   . Dizziness   . Dysmenorrhea   . GERD (gastroesophageal reflux disease)   . Headache   . Mood disorder (HCC)   . Schizophrenia (HCC)   . Scoliosis   . Thrush     Patient Active Problem List   Diagnosis Date Noted  . Prolonged QT interval 09/14/2015  . Hypomagnesemia 09/14/2015  . Hypokalemia 09/14/2015  . Tobacco use disorder 09/14/2015  . 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 Vitamin B12 deficiency 08/12/2015  . 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 Folate deficiency 08/12/2015  . Schizophrenia (HCC) 04/30/2015  . Benign paroxysmal positional vertigo 03/04/2015  . Cervicalgia 05/06/2013  . Myofascial muscle pain 02/06/2013  . Sacroiliitis (HCC) 07/09/2012  . Idiopathic scoliosis 06/12/2012  . Lumbar spondylosis with myelopathy 06/12/2012  . Osteopenia 06/12/2012    Past Surgical History:  Procedure Laterality Date  . BREAST SURGERY    . MOUTH SURGERY      OB History    No data available       Home Medications    Prior to Admission medications   Medication Sig  Start Date End Date Taking? Authorizing Provider  acetaminophen (TYLENOL) 325 MG tablet Take 650 mg by mouth every 6 (six) hours as needed for mild pain or fever.    [provider]  hydrOXYzine (ATARAX/VISTARIL) 50 MG tablet Take 50 mg by mouth 3 (three) times daily as needed for nausea.    [provider]  ondansetron (ZOFRAN ODT) 4 MG disintegrating tablet Take 1 tablet (4 mg total) by mouth every 4 (four) hours as needed for nausea or vomiting. Patient not taking: Reported on 12/15/2016 10/17/16   Arby Barrette, MD  promethazine (PHENERGAN) 25 MG suppository Place 1 suppository (25 mg total) rectally every 6 (six) hours as needed for nausea or vomiting. Patient not taking: Reported on 12/15/2016 07/24/16   Alvira Monday, MD  promethazine (PHENERGAN) 25 MG suppository Place 1 suppository (25 mg total) rectally every 6 (six) hours as needed for nausea or vomiting. Patient not taking: Reported on 12/15/2016 10/17/16   Arby Barrette, MD  ranitidine (ZANTAC) 150 MG capsule Take 1 capsule (150 mg total) by mouth daily. Patient not taking: Reported on 12/15/2016 07/24/16   Alvira Monday, MD  tiZANidine (ZANAFLEX) 4 MG tablet Take 4 mg by mouth at bedtime.    [provider]  VRAYLAR 3 MG CAPS Take 3 mg by mouth daily.  05/02/16   [provider]    Family History Family History  Problem Relation Age of Onset  . Thyroid disease Mother   .  Asthma Mother   . Mental illness Father   . Hyperlipidemia Father   . Healthy Brother   . Healthy Daughter   . Heart disease Paternal Grandfather   . Heart disease Paternal Grandmother     Social History Social History  Substance Use Topics  . Smoking status: Current Every Day Smoker    Packs/day: 0.25    Years: 16.00    Types: Cigarettes  . Smokeless tobacco: Never Used  . Alcohol use 0.0 oz/week     Comment: One wine cooler per month     Allergies   Lavender oil   Review of Systems Review of Systems    Constitutional: Negative for chills and fever.  Respiratory: Negative for shortness of breath.   Cardiovascular: Negative for chest pain.  Gastrointestinal: Positive for abdominal pain, diarrhea, nausea and vomiting.  Genitourinary: Negative for dysuria and vaginal discharge.  Psychiatric/Behavioral: The patient is nervous/anxious.   All other systems reviewed and are negative.    Physical Exam Updated Vital Signs BP 126/70 (BP Location: Left Arm)   Pulse 84   Temp 97.9 F (36.6 C) (Oral)   Resp 18   Ht 5\' 2"  (1.575 m)   Wt 79.4 kg (175 lb)   LMP 02/17/2017   SpO2 98%   BMI 32.01 kg/m   Physical Exam  Constitutional: She is oriented to person, place, and time. She appears well-developed and well-nourished. She appears distressed (mildly tremulous).  HENT:  Head: Normocephalic and atraumatic.  Eyes: Pupils are equal, round, and reactive to light. Conjunctivae are normal. Right eye exhibits no discharge. Left eye exhibits no discharge. No scleral icterus.  Neck: Normal range of motion.  Cardiovascular: Normal rate and regular rhythm.  Exam reveals no gallop and no friction rub.   No murmur heard. Pulmonary/Chest: Effort normal and breath sounds normal. No respiratory distress. She has no wheezes. She has no rales. She exhibits no tenderness.  Abdominal: Soft. Bowel sounds are normal. She exhibits no distension and no mass. There is tenderness (generalized but mostly in epigastric). There is no rebound and no guarding. No hernia.  Neurological: She is alert and oriented to person, place, and time.  Skin: Skin is warm and dry.  Psychiatric: Her behavior is normal. Her mood appears anxious.  Nursing note and vitals reviewed.    ED Treatments / Results  Labs (all labs ordered are listed, but only abnormal results are displayed) Labs Reviewed  COMPREHENSIVE METABOLIC PANEL - Abnormal; Notable for the following:       Result Value   Potassium 2.9 (*)    Glucose, Bld 118 (*)     BUN <5 (*)    All other components within normal limits  CBC - Abnormal; Notable for the following:    WBC 16.1 (*)    Platelets 431 (*)    All other components within normal limits  URINALYSIS, ROUTINE W REFLEX MICROSCOPIC - Abnormal; Notable for the following:    Color, Urine STRAW (*)    Specific Gravity, Urine 1.002 (*)    All other components within normal limits  LIPASE, BLOOD    EKG  EKG Interpretation None       Radiology No results found.  Procedures Procedures (including critical care time)  Medications Ordered in ED Medications  sodium chloride 0.9 % bolus 1,000 mL (0 mLs Intravenous Stopped 02/18/17 1342)  haloperidol lactate (HALDOL) injection 5 mg (5 mg Intravenous Given 02/18/17 1249)  LORazepam (ATIVAN) tablet 2 mg (2 mg Oral Given  02/18/17 1248)  famotidine (PEPCID) tablet 40 mg (40 mg Oral Given 02/18/17 1248)  potassium chloride SA (K-DUR,KLOR-CON) CR tablet 40 mEq (40 mEq Oral Given 02/18/17 1319)     Initial Impression / Assessment and Plan / ED Course  I have reviewed the triage vital signs and the nursing notes.  Pertinent labs & imaging results that were available during my care of the patient were reviewed by me and considered in my medical decision making (see chart for details).  31 year old with N/V/D and uncontrolled anxiety. Vitals are normal. I suspect her symptoms are multifactorial. She appears to have severe anxiety which is exacerbating her symptoms. Her N/V is relatively mild and she is tolerating PO on exam, drinking water. CBC is remarkable for leukocytosis of 16.1 which is around her baseline. CMP is remarkable for hypokalemia of 2.9. IVF, Potassium, Pepcid, Haldol, Ativan given. Advised cessation of marijuana and to f/u with her psychiatrist.   Final Clinical Impressions(s) / ED Diagnoses   Final diagnoses:  Nausea vomiting and diarrhea  Anxiety    New Prescriptions New Prescriptions   No medications on file     Bethel BornGekas,  Cristela Stalder Marie, PA-C 02/18/17 1456    Benjiman CorePickering, Nathan, MD 02/18/17 1606

## 2017-03-02 ENCOUNTER — Ambulatory Visit: Payer: Medicaid Other | Admitting: Internal Medicine

## 2017-03-02 ENCOUNTER — Encounter: Payer: Self-pay | Admitting: Internal Medicine

## 2017-03-02 VITALS — BP 102/64 | HR 87 | Ht 62.0 in | Wt 170.4 lb

## 2017-03-02 DIAGNOSIS — F17210 Nicotine dependence, cigarettes, uncomplicated: Secondary | ICD-10-CM

## 2017-03-02 DIAGNOSIS — R9389 Abnormal findings on diagnostic imaging of other specified body structures: Secondary | ICD-10-CM | POA: Diagnosis not present

## 2017-03-02 DIAGNOSIS — J449 Chronic obstructive pulmonary disease, unspecified: Secondary | ICD-10-CM | POA: Insufficient documentation

## 2017-03-02 NOTE — Progress Notes (Signed)
Subjective:     Patient ID: Laura Montoya, female   DOB: 1985-07-19, 31 y.o.   MRN: [id number]  HPI    [age] yowf actively smoking since age 31 with tendency to wheezing x 2015 x albterol rarely needs  onset of cough around 2015-16 each am black stuff in am up to sev tsp worse in winter then acutely ill  07/22/16  Vomiting > ER  > w/u dx gastritis/gerd f/u [facility] but incidental dx of spn  referred to pulmonary clinic 03/02/2017 by  Lonie PeakNathan Conroy.     03/02/2017 [facility]st [facility] office visit/ Laura Montoya   Chief Complaint  Patient presents with  . Pulmonary Consult    Referred by Dr. Lonie PeakNathan Conroy for abnormal CT Chest. She states went to ED at with GERD April 2018 and CT Abdomen was done. She was told would need f/u on lung nodule in 6 months. She c/o wheezing for the past 6 months and has SOB when she lies down.   doe = Not limited by breathing from desired activities on good days  Has tendency to acute flares of cough /sob in winters controlled with prn saba but avg use otherwise just once or twice a month as now.  No obvious day to day or daytime variability or assoc excess/ purulent sputum or mucus plugs or hemoptysis or cp or chest tightness,  or overt sinus   symptoms. No unusual exp hx or h/o childhood pna/ asthma or knowledge of premature birth.    Also denies any other obvious fluctuation of symptoms with weather or environmental changes or other aggravating or alleviating factors except as outlined above   Current Allergies, Complete Past Medical History, Past Surgical History, Family History, and Social History were reviewed in [facility] electronic medical record.  ROS  The following are not active complaints unless bolded Hoarseness, sore throat, dysphagia, dental problems, itching, sneezing,  nasal congestion or discharge of excess mucus or purulent secretions, ear ache,   fever, chills, sweats, unintended wt loss or wt gain, classically pleuritic or exertional cp,   orthopnea pnd or leg swelling, presyncope, palpitations, abdominal pain, anorexia, nausea, vomiting, diarrhea  or change in bowel habits or change in bladder habits, change in stools or change in urine, dysuria, hematuria,  rash, arthralgias, visual complaints, headache, numbness, weakness or ataxia or problems with walking or coordination,  change in mood/affect or memory.        Current Meds  Medication Sig  . acetaminophen (TYLENOL) 325 MG tablet Take 650 mg by mouth every 6 (six) hours as needed for mild pain or fever.  . Cyanocobalamin (B-12) 2500 MCG TABS Take 1 tablet every 7 (seven) days by mouth.  . esomeprazole (NEXIUM) 40 MG capsule Take 40 mg daily as needed by mouth.  Marland Kitchen. FLUoxetine (PROZAC) 20 MG tablet Take 20 mg daily by mouth.  . folic acid (FOLVITE) 400 MCG tablet Take 400 mcg every 7 (seven) days by mouth.  . hydrOXYzine (ATARAX/VISTARIL) 50 MG tablet Take 50 mg by mouth 3 (three) times daily as needed for nausea.  . nortriptyline (PAMELOR) 50 MG capsule Take 50 mg 2 (two) times a week by mouth.  . promethazine (PHENERGAN) 25 MG suppository Place 1 suppository (25 mg total) rectally every 6 (six) hours as needed for nausea or vomiting.  . ranitidine (ZANTAC) 150 MG tablet Take 1 tablet (150 mg total) by mouth 2 (two) times daily. (Patient taking differently: Take 150 mg every other day by mouth. )  .  Thiamine HCl (B-1 PO) Take 1 capsule every 7 (seven) days by mouth.  Marland Kitchen. tiZANidine (ZANAFLEX) 4 MG tablet Take 4 mg by mouth at bedtime.  Marland Kitchen. VRAYLAR 3 MG CAPS Take 3 mg by mouth daily.          Review of Systems     Objective:   Physical Exam  amb anx wf nad  Wt Readings from Last 3 Encounters:  03/02/17 170 lb 6.4 oz (77.3 kg)  02/18/17 175 lb (79.4 kg)  12/14/16 177 lb (80.3 kg)    Vital signs reviewed - Note on arrival 02 sats  100% on RA     HEENT: nl dentition, turbinates bilaterally, and oropharynx. Nl external ear canals without cough reflex   NECK :   without JVD/Nodes/TM/ nl carotid upstrokes bilaterally   LUNGS: no acc muscle use,  Nl contour chest which is clear to A and P bilaterally without cough on insp or exp maneuvers   CV:  RRR  no s3 or murmur or increase in P2, and no edema   ABD:  soft and nontender with nl inspiratory excursion in the supine position. No bruits or organomegaly appreciated, bowel sounds nl  MS:  Nl gait/ ext warm without deformities, calf tenderness, cyanosis or clubbing No obvious joint restrictions   SKIN: warm and dry without lesions    NEURO:  alert, approp, nl sensorium with  no motor or cerebellar deficits apparent.      I personally reviewed images and agree with radiology impression as follows:   Chest  windows on abd ct  07/24/16 Small patchy areas of ground-glass opacity at the left lung base with the largest area measuring 8 mm. may be secondary to an infectious or inflammatory etiology. Neoplastic etiology is considered less likely. Non-contrast chest CT at 3-6 months is recommended     Assessment:

## 2017-03-02 NOTE — Patient Instructions (Signed)
Only use your albuterol as a rescue medication to be used if you can't catch your breath by resting or doing a relaxed purse lip breathing pattern.  - The less you use it, the better it will work when you need it. - Ok to use up to 2 puffs  every 4 hours if you must but call for immediate appointment if use goes up over your usual need - Don't leave home without it !!  (think of it like the spare tire for your car)    The key is to stop smoking completely before smoking completely stops you!    We will contact you for a follow up Ct chest after the first of the year

## 2017-03-03 ENCOUNTER — Encounter: Payer: Self-pay | Admitting: Internal Medicine

## 2017-03-03 NOTE — Assessment & Plan Note (Signed)
Spirometry 03/02/2017  Nl s curvature      I reviewed the Fletcher curve with the patient that basically indicates  if you quit smoking when your best day FEV1 is still well preserved (as is clearly  the case here)  it is highly unlikely you will progress to severe disease and informed the patient there was  no medication on the market that has proven to alter the curve/ its downward trajectory  or the likelihood of progression of their disease(unlike other chronic medical conditions such as atheroclerosis where we do think we can change the natural hx with risk reducing meds)    Therefore stopping smoking and maintaining abstinence is the most important aspect of care, not choice of inhalers or for that matter, doctors.  rx is directed at symptoms and help with smoking cessation when she is ready and neither apply now so pulmonary f/u is prn

## 2017-03-03 NOTE — Assessment & Plan Note (Addendum)
>   3 min Discussed the risks and costs (both direct and indirect)  of smoking relative to the benefits of quitting but patient unwilling to commit at this point to a specific quit date.       

## 2017-03-03 NOTE — Assessment & Plan Note (Signed)
CT results reviewed with pt >>> Too small for PET or bx, not suspicious enough for excisional bx > really only option for now is follow the Fleischner society guidelines as rec by radiology= f/u CT due now but she's will to wait until after the first of the year based on  Cost and radiation concerns.  Discussed in detail all the  indications, usual  risks and alternatives  relative to the benefits with patient who agrees to proceed with conservative f/u as outlined with risks of ca quite low given her age and neg fm hx.  I  Emphasized at age 31 her best chance to avoid ca is stop smoking,  Not get more CT scans.  Total time devoted to counseling  > 50 % of initial 60 min office visit:  review case with pt/ discussion of options/alternatives/ personally creating written customized instructions  in presence of pt  then going over those specific  Instructions directly with the pt including how to use all of the meds but in particular covering each new medication in detail and the difference between the maintenance= "automatic" meds and the prns using an action plan format for the latter (If this problem/symptom => do that organization reading Left to right).  Please see AVS from this visit for a full list of these instructions which I personally wrote for this pt and  are unique to this visit.

## 2017-04-15 ENCOUNTER — Encounter (HOSPITAL_COMMUNITY): Payer: Self-pay | Admitting: Emergency Medicine

## 2017-04-15 ENCOUNTER — Other Ambulatory Visit: Payer: Self-pay

## 2017-04-15 DIAGNOSIS — Z79899 Other long term (current) drug therapy: Secondary | ICD-10-CM | POA: Insufficient documentation

## 2017-04-15 DIAGNOSIS — F17210 Nicotine dependence, cigarettes, uncomplicated: Secondary | ICD-10-CM | POA: Insufficient documentation

## 2017-04-15 DIAGNOSIS — R1013 Epigastric pain: Secondary | ICD-10-CM | POA: Insufficient documentation

## 2017-04-15 DIAGNOSIS — R197 Diarrhea, unspecified: Secondary | ICD-10-CM | POA: Diagnosis not present

## 2017-04-15 DIAGNOSIS — J45909 Unspecified asthma, uncomplicated: Secondary | ICD-10-CM | POA: Insufficient documentation

## 2017-04-15 DIAGNOSIS — R112 Nausea with vomiting, unspecified: Secondary | ICD-10-CM | POA: Insufficient documentation

## 2017-04-15 LAB — COMPREHENSIVE METABOLIC PANEL
ALK PHOS: 98 U/L (ref 38–126)
ALT: 11 U/L — ABNORMAL LOW (ref 14–54)
ANION GAP: 11 (ref 5–15)
AST: 28 U/L (ref 15–41)
Albumin: 3.8 g/dL (ref 3.5–5.0)
BILIRUBIN TOTAL: 0.5 mg/dL (ref 0.3–1.2)
BUN: <5 mg/dL — ABNORMAL LOW (ref 6–20)
CALCIUM: 9.4 mg/dL (ref 8.9–10.3)
CO2: 25 mmol/L (ref 22–32)
Chloride: 97 mmol/L — ABNORMAL LOW (ref 101–111)
Creatinine, Ser: 0.8 mg/dL (ref 0.44–1.00)
GLUCOSE: 125 mg/dL — AB (ref 65–99)
POTASSIUM: 3.8 mmol/L (ref 3.5–5.1)
Sodium: 133 mmol/L — ABNORMAL LOW (ref 135–145)
TOTAL PROTEIN: 7 g/dL (ref 6.5–8.1)

## 2017-04-15 LAB — CBC
HEMATOCRIT: 38.6 % (ref 36.0–46.0)
HEMOGLOBIN: 13.6 g/dL (ref 12.0–15.0)
MCH: 32.6 pg (ref 26.0–34.0)
MCHC: 35.2 g/dL (ref 30.0–36.0)
MCV: 92.6 fL (ref 78.0–100.0)
Platelets: 429 10*3/uL — ABNORMAL HIGH (ref 150–400)
RBC: 4.17 MIL/uL (ref 3.87–5.11)
RDW: 13.2 % (ref 11.5–15.5)
WBC: 16.1 10*3/uL — AB (ref 4.0–10.5)

## 2017-04-15 LAB — URINALYSIS, ROUTINE W REFLEX MICROSCOPIC
Bacteria, UA: NONE SEEN
Bilirubin Urine: NEGATIVE
GLUCOSE, UA: NEGATIVE mg/dL
HGB URINE DIPSTICK: NEGATIVE
KETONES UR: 5 mg/dL — AB
LEUKOCYTES UA: NEGATIVE
NITRITE: NEGATIVE
PH: 6 (ref 5.0–8.0)
Protein, ur: 100 mg/dL — AB
RBC / HPF: NONE SEEN RBC/hpf (ref 0–5)
Specific Gravity, Urine: 1.024 (ref 1.005–1.030)

## 2017-04-15 LAB — I-STAT BETA HCG BLOOD, ED (MC, WL, AP ONLY): I-stat hCG, quantitative: <5 m[IU]/mL (ref <5)

## 2017-04-15 LAB — LIPASE, BLOOD: Lipase: 17 U/L (ref 11–51)

## 2017-04-15 MED ORDER — ONDANSETRON 4 MG PO TBDP
4.0000 mg | ORAL_TABLET | Freq: Once | ORAL | Status: AC | PRN
  Administered 2017-04-15: 4 mg via ORAL
  Filled 2017-04-15: qty 1

## 2017-04-15 NOTE — ED Triage Notes (Signed)
Reports n/v since this morning with RUQ abdominal pain.

## 2017-04-16 ENCOUNTER — Emergency Department (HOSPITAL_COMMUNITY)
Admission: EM | Admit: 2017-04-16 | Discharge: 2017-04-16 | Disposition: A | Discharge status: Home / Self Care | Payer: Medicaid Other | Attending: Emergency Medicine | Admitting: Emergency Medicine

## 2017-04-16 DIAGNOSIS — R197 Diarrhea, unspecified: Secondary | ICD-10-CM

## 2017-04-16 DIAGNOSIS — R112 Nausea with vomiting, unspecified: Secondary | ICD-10-CM

## 2017-04-16 DIAGNOSIS — R1013 Epigastric pain: Secondary | ICD-10-CM

## 2017-04-16 MED ORDER — LORAZEPAM 1 MG PO TABS
1.0000 mg | ORAL_TABLET | Freq: Once | ORAL | Status: AC
  Administered 2017-04-16: 1 mg via ORAL
  Filled 2017-04-16: qty 1

## 2017-04-16 MED ORDER — GI COCKTAIL ~~LOC~~
30.0000 mL | Freq: Once | ORAL | Status: AC
  Administered 2017-04-16: 30 mL via ORAL
  Filled 2017-04-16: qty 30

## 2017-04-16 MED ORDER — SUCRALFATE 1 G PO TABS
1.0000 g | ORAL_TABLET | Freq: Three times a day (TID) | ORAL | 0 refills | Status: DC
Start: 2017-04-16 — End: 2017-09-14

## 2017-04-16 MED ORDER — ONDANSETRON 4 MG PO TBDP
4.0000 mg | ORAL_TABLET | Freq: Once | ORAL | Status: AC
  Administered 2017-04-16: 4 mg via ORAL
  Filled 2017-04-16: qty 1

## 2017-04-16 NOTE — ED Provider Notes (Signed)
[facility] Provider [facility]   CSN: [id number] Arrival date & time: 04/15/17  2108     History   Chief Complaint Chief Complaint  Patient presents with  . Abdominal Pain  . Emesis    HPI Laura Montoya is a 31 y.o. female.  HPI  Is a 31 year old female with a history of anxiety, schizophrenia, recurrent abdominal pain, nausea, vomiting who presents with abdominal pain and vomiting.  Patient reports 1 day history of worsening epigastric pain.  She states that radiates into the chest.  She describes it as pressure.  Currently rates her pain at 8 out of 10.  She took multiple medications at home including Nexium with minimal relief.  No exacerbating or alleviating factors.  Reports that this feels like her GERD.  Previously states that she has had a CT scan and ultrasound that have been reassuring.  She does follow with a gastroenterologist.  Past Medical History:  Diagnosis Date  . Anxiety   . Asthma   . Dizziness   . Dysmenorrhea   . GERD (gastroesophageal reflux disease)   . Headache   . Mood disorder (HCC)   . Schizophrenia (HCC)   . Scoliosis   . Thrush     Patient Active Problem List   Diagnosis Date Noted  . Cigarette smoker 03/02/2017  . Abnormal CT of the chest 03/02/2017  . COPD  GOLD 0  03/02/2017  . Prolonged QT interval 09/14/2015  . Hypomagnesemia 09/14/2015  . Hypokalemia 09/14/2015  . Tobacco use disorder 09/14/2015  . 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 Vitamin B12 deficiency 08/12/2015  . 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 Folate deficiency 08/12/2015  . Schizophrenia (HCC) 04/30/2015  . Benign paroxysmal positional vertigo 03/04/2015  . Cervicalgia 05/06/2013  . Myofascial muscle pain 02/06/2013  . Sacroiliitis (HCC) 07/09/2012  . Idiopathic scoliosis 06/12/2012  . Lumbar spondylosis with myelopathy 06/12/2012  . Osteopenia 06/12/2012    Past Surgical History:    Procedure Laterality Date  . BREAST SURGERY    . MOUTH SURGERY      OB History    No data available       Home Medications    Prior to Admission medications   Medication Sig Start Date End Date Taking? Authorizing Provider  acetaminophen (TYLENOL) 325 MG tablet Take 650 mg by mouth every 6 (six) hours as needed for mild pain or fever.    [provider]  Cyanocobalamin (B-12) 2500 MCG TABS Take 1 tablet every 7 (seven) days by mouth.    [provider]  esomeprazole (NEXIUM) 40 MG capsule Take 40 mg daily as needed by mouth.    [provider]  FLUoxetine (PROZAC) 20 MG tablet Take 20 mg daily by mouth.    [provider]  folic acid (FOLVITE) 400 MCG tablet Take 400 mcg every 7 (seven) days by mouth.    [provider]  hydrOXYzine (ATARAX/VISTARIL) 50 MG tablet Take 50 mg by mouth 3 (three) times daily as needed for nausea.    [provider]  nortriptyline (PAMELOR) 50 MG capsule Take 50 mg 2 (two) times a week by mouth.    [provider]  promethazine (PHENERGAN) 25 MG suppository Place 1 suppository (25 mg total) rectally every 6 (six) hours as needed for nausea or vomiting. 02/18/17   Bethel BornGekas, Kelly Marie, PA-C  ranitidine (ZANTAC) 150 MG tablet Take 1 tablet (150 mg total) by mouth 2 (two) times  daily. Patient taking differently: Take 150 mg every other day by mouth.  02/18/17   Bethel BornGekas, Kelly Marie, PA-C  sucralfate (CARAFATE) 1 g tablet Take 1 tablet (1 g total) by mouth 4 (four) times daily -  with meals and at bedtime. 04/16/17   Horton, Mayer Maskerourtney F, MD  Thiamine HCl (B-1 PO) Take 1 capsule every 7 (seven) days by mouth.    [provider]  tiZANidine (ZANAFLEX) 4 MG tablet Take 4 mg by mouth at bedtime.    [provider]  VRAYLAR 3 MG CAPS Take 3 mg by mouth daily.  05/02/16   [provider]    Family History Family History  Problem Relation Age of Onset  . Thyroid disease Mother   .  Asthma Mother   . Mental illness Father   . Hyperlipidemia Father   . Healthy Brother   . Healthy Daughter   . Heart disease Paternal Grandfather   . Heart disease Paternal Grandmother     Social History Social History   Tobacco Use  . Smoking status: Current Every Day Smoker    Packs/day: 2.00    Years: 21.00    Pack years: 42.00    Types: Cigarettes  . Smokeless tobacco: Never Used  Substance Use Topics  . Alcohol use: Yes    Alcohol/week: 0.0 oz    Comment: One wine cooler per month  . Drug use: Yes    Types: Marijuana     Allergies   Lavender oil   Review of Systems Review of Systems  Constitutional: Negative for fever.  Respiratory: Negative for shortness of breath.   Cardiovascular: Negative for chest pain.  Gastrointestinal: Positive for abdominal pain, diarrhea, nausea and vomiting.  Genitourinary: Negative for dysuria.  All other systems reviewed and are negative.    Physical Exam Updated Vital Signs BP (!) 127/95 (BP Location: Right Arm)   Pulse 80   Temp 97.8 F (36.6 C) (Oral)   Resp 20   Ht 5\' 2"  (1.575 m)   Wt 77.1 kg (170 lb)   SpO2 100%   BMI 31.09 kg/m   Physical Exam  Constitutional: She is oriented to person, place, and time. She appears well-developed and well-nourished.  Anxious appearing, no acute distress  HENT:  Head: Normocephalic and atraumatic.  Neck: Neck supple.  Cardiovascular: Normal rate, regular rhythm and normal heart sounds.  Pulmonary/Chest: Effort normal. No respiratory distress. She has no wheezes.  Abdominal: Soft. Bowel sounds are normal. There is tenderness in the epigastric area. There is no rebound, no guarding and negative Murphy's sign.  Neurological: She is alert and oriented to person, place, and time.  Tremulous  Skin: Skin is warm and dry.  Psychiatric: She has a normal mood and affect.  Nursing note and vitals reviewed.    ED Treatments / Results  Labs (all labs ordered are listed, but only  abnormal results are displayed) Labs Reviewed  COMPREHENSIVE METABOLIC PANEL - Abnormal; Notable for the following components:      Result Value   Sodium 133 (*)    Chloride 97 (*)    Glucose, Bld 125 (*)    BUN <5 (*)    ALT 11 (*)    All other components within normal limits  CBC - Abnormal; Notable for the following components:   WBC 16.1 (*)    Platelets 429 (*)    All other components within normal limits  URINALYSIS, ROUTINE W REFLEX MICROSCOPIC - Abnormal; Notable for the following  components:   APPearance HAZY (*)    Ketones, ur 5 (*)    Protein, ur 100 (*)    Squamous Epithelial / LPF 0-5 (*)    All other components within normal limits  LIPASE, BLOOD  I-STAT BETA HCG BLOOD, ED ([facility], [facility], [facility] ONLY)    EKG  EKG Interpretation None       Radiology No results found.  Procedures Procedures (including critical care time)  Medications Ordered in ED Medications  ondansetron (ZOFRAN-ODT) disintegrating tablet 4 mg (4 mg Oral Given 04/15/17 2127)  gi cocktail (Maalox,Lidocaine,Donnatal) (30 mLs Oral Given 04/16/17 0313)  ondansetron (ZOFRAN-ODT) disintegrating tablet 4 mg (4 mg Oral Given 04/16/17 0303)  LORazepam (ATIVAN) tablet 1 mg (1 mg Oral Given 04/16/17 0320)     Initial Impression / Assessment and Plan / ED Course  I have reviewed the triage vital signs and the nursing notes.  Pertinent labs & imaging results that were available during my care of the patient were reviewed by me and considered in my medical decision making (see chart for details).     She presents with recurrent nausea, vomiting, epigastric pain.  She relates this to her GERD.  She is tremulous appearing but nontoxic.  She states she is very anxious.  No signs of peritonitis on exam.  Lab workup is reassuring.  She previously has had an ultrasound and a CT scan both of which have been reassuring.  Patient was given a GI cocktail and Zofran.  On recheck, she is resting comfortably.  No  recurrent emesis.  Recommend follow-up with GI.  After history, exam, and medical workup I feel the patient has been appropriately medically screened and is safe for discharge home. Pertinent diagnoses were discussed with the patient. Patient was given return precautions.   Final Clinical Impressions(s) / ED Diagnoses   Final diagnoses:  Nausea vomiting and diarrhea  Epigastric pain    ED Discharge Orders        Ordered    sucralfate (CARAFATE) 1 g tablet  3 times daily with meals & bedtime     04/16/17 0406       Shon Baton, MD 04/16/17 0425

## 2017-04-16 NOTE — Discharge Instructions (Signed)
You were seen today for nausea, vomiting, diarrhea, abdominal pain.  Your workup is reassuring.  This may be related to your known gastritis.  Follow-up with gastroenterology.

## 2017-07-12 ENCOUNTER — Other Ambulatory Visit: Payer: Self-pay | Admitting: Physician Assistant

## 2017-07-12 DIAGNOSIS — R102 Pelvic and perineal pain: Secondary | ICD-10-CM

## 2017-07-17 ENCOUNTER — Ambulatory Visit
Admission: RE | Admit: 2017-07-17 | Discharge: 2017-07-17 | Disposition: A | Discharge status: Home / Self Care | Payer: Medicaid Other | Source: Ambulatory Visit | Attending: Physician Assistant | Admitting: Physician Assistant

## 2017-07-17 DIAGNOSIS — R102 Pelvic and perineal pain: Secondary | ICD-10-CM

## 2017-09-13 ENCOUNTER — Encounter (HOSPITAL_COMMUNITY): Payer: Self-pay | Admitting: Emergency Medicine

## 2017-09-13 ENCOUNTER — Emergency Department (HOSPITAL_COMMUNITY)
Admission: EM | Admit: 2017-09-13 | Discharge: 2017-09-13 | Disposition: A | Discharge status: Home / Self Care | Payer: Medicaid Other | Attending: Emergency Medicine | Admitting: Emergency Medicine

## 2017-09-13 ENCOUNTER — Encounter (HOSPITAL_COMMUNITY): Payer: Self-pay | Admitting: *Deleted

## 2017-09-13 ENCOUNTER — Other Ambulatory Visit: Payer: Self-pay

## 2017-09-13 ENCOUNTER — Emergency Department (HOSPITAL_COMMUNITY)
Admission: EM | Admit: 2017-09-13 | Discharge: 2017-09-14 | Disposition: A | Discharge status: Home / Self Care | Payer: Medicaid Other | Source: Home / Self Care | Attending: Emergency Medicine | Admitting: Emergency Medicine

## 2017-09-13 DIAGNOSIS — Z5321 Procedure and treatment not carried out due to patient leaving prior to being seen by health care provider: Secondary | ICD-10-CM | POA: Insufficient documentation

## 2017-09-13 DIAGNOSIS — F1290 Cannabis use, unspecified, uncomplicated: Secondary | ICD-10-CM

## 2017-09-13 DIAGNOSIS — R1084 Generalized abdominal pain: Secondary | ICD-10-CM | POA: Insufficient documentation

## 2017-09-13 DIAGNOSIS — G43A0 Cyclical vomiting, not intractable: Secondary | ICD-10-CM

## 2017-09-13 LAB — URINALYSIS, ROUTINE W REFLEX MICROSCOPIC
Bacteria, UA: NONE SEEN
Bilirubin Urine: NEGATIVE
GLUCOSE, UA: NEGATIVE mg/dL
Glucose, UA: NEGATIVE mg/dL
Hgb urine dipstick: NEGATIVE
KETONES UR: 80 mg/dL — AB
Ketones, ur: 20 mg/dL — AB
LEUKOCYTES UA: NEGATIVE
Leukocytes, UA: NEGATIVE
NITRITE: NEGATIVE
Nitrite: NEGATIVE
PH: 6 (ref 5.0–8.0)
Protein, ur: 100 mg/dL — AB
Protein, ur: NEGATIVE mg/dL
SPECIFIC GRAVITY, URINE: 1.005 (ref 1.005–1.030)
Specific Gravity, Urine: 1.025 (ref 1.005–1.030)
pH: 7 (ref 5.0–8.0)

## 2017-09-13 LAB — CBC
HCT: 39.6 % (ref 36.0–46.0)
HEMATOCRIT: 41.6 % (ref 36.0–46.0)
HEMOGLOBIN: 14.5 g/dL (ref 12.0–15.0)
Hemoglobin: 14.1 g/dL (ref 12.0–15.0)
MCH: 32.6 pg (ref 26.0–34.0)
MCH: 31.8 pg (ref 26.0–34.0)
MCHC: 35.6 g/dL (ref 30.0–36.0)
MCHC: 34.9 g/dL (ref 30.0–36.0)
MCV: 91.5 fL (ref 78.0–100.0)
MCV: 91.2 fL (ref 78.0–100.0)
PLATELETS: 435 10*3/uL — AB (ref 150–400)
Platelets: 461 10*3/uL — ABNORMAL HIGH (ref 150–400)
RBC: 4.33 MIL/uL (ref 3.87–5.11)
RBC: 4.56 MIL/uL (ref 3.87–5.11)
RDW: 12.7 % (ref 11.5–15.5)
RDW: 12.8 % (ref 11.5–15.5)
WBC: 14.3 10*3/uL — AB (ref 4.0–10.5)
WBC: 13.7 10*3/uL — AB (ref 4.0–10.5)

## 2017-09-13 LAB — LIPASE, BLOOD
LIPASE: 22 U/L (ref 11–51)
Lipase: 20 U/L (ref 11–51)

## 2017-09-13 LAB — COMPREHENSIVE METABOLIC PANEL
ALBUMIN: 3.7 g/dL (ref 3.5–5.0)
ALK PHOS: 80 U/L (ref 38–126)
ALT: 9 U/L — ABNORMAL LOW (ref 14–54)
ALT: 10 U/L — AB (ref 14–54)
ANION GAP: 18 — AB (ref 5–15)
AST: 16 U/L (ref 15–41)
AST: 27 U/L (ref 15–41)
Albumin: 4 g/dL (ref 3.5–5.0)
Alkaline Phosphatase: 78 U/L (ref 38–126)
Anion gap: 14 (ref 5–15)
BILIRUBIN TOTAL: 1.4 mg/dL — AB (ref 0.3–1.2)
BUN: 10 mg/dL (ref 6–20)
BUN: 7 mg/dL (ref 6–20)
CALCIUM: 9.4 mg/dL (ref 8.9–10.3)
CALCIUM: 9.5 mg/dL (ref 8.9–10.3)
CO2: 31 mmol/L (ref 22–32)
CO2: 28 mmol/L (ref 22–32)
CREATININE: 0.83 mg/dL (ref 0.44–1.00)
Chloride: 91 mmol/L — ABNORMAL LOW (ref 101–111)
Chloride: 86 mmol/L — ABNORMAL LOW (ref 101–111)
Creatinine, Ser: 0.99 mg/dL (ref 0.44–1.00)
GFR calc non Af Amer: >60 mL/min (ref >60)
Glucose, Bld: 97 mg/dL (ref 65–99)
Glucose, Bld: 91 mg/dL (ref 65–99)
POTASSIUM: 2.9 mmol/L — AB (ref 3.5–5.1)
Potassium: 3 mmol/L — ABNORMAL LOW (ref 3.5–5.1)
SODIUM: 136 mmol/L (ref 135–145)
SODIUM: 132 mmol/L — AB (ref 135–145)
TOTAL PROTEIN: 6.5 g/dL (ref 6.5–8.1)
TOTAL PROTEIN: 6.9 g/dL (ref 6.5–8.1)
Total Bilirubin: 0.8 mg/dL (ref 0.3–1.2)

## 2017-09-13 LAB — I-STAT BETA HCG BLOOD, ED (MC, WL, AP ONLY): I-stat hCG, quantitative: <5 m[IU]/mL (ref <5)

## 2017-09-13 LAB — I-STAT TROPONIN, ED: TROPONIN I, POC: 0 ng/mL (ref 0.00–0.08)

## 2017-09-13 MED ORDER — GI COCKTAIL ~~LOC~~
30.0000 mL | Freq: Once | ORAL | Status: AC
  Administered 2017-09-13: 30 mL via ORAL
  Filled 2017-09-13: qty 30

## 2017-09-13 MED ORDER — PROMETHAZINE HCL 25 MG PO TABS
25.0000 mg | ORAL_TABLET | Freq: Once | ORAL | Status: AC
  Administered 2017-09-13: 25 mg via ORAL
  Filled 2017-09-13: qty 1

## 2017-09-13 MED ORDER — ONDANSETRON 4 MG PO TBDP
4.0000 mg | ORAL_TABLET | Freq: Once | ORAL | Status: AC | PRN
Start: 2017-09-13 — End: 2017-09-13
  Administered 2017-09-13: 4 mg via ORAL
  Filled 2017-09-13: qty 1

## 2017-09-13 NOTE — ED Triage Notes (Signed)
Pt started having NV, abd pain and acid reflux for the past 48 hours. Pt has tried zofran and tylenol without relief. Pain described like a heaviness in epigastric region

## 2017-09-13 NOTE — ED Triage Notes (Signed)
Onset two day ago developed nausea and emesis and general abdominal pain. States history of same symptoms with history of acid reflux. Pain currently 7.5/10 achy pain.

## 2017-09-13 NOTE — ED Notes (Signed)
Pt states she is feeling better and is going home. Encourage pt to stay she states that she just wants to go home.

## 2017-09-14 MED ORDER — CAPSAICIN 0.025 % EX CREA
TOPICAL_CREAM | Freq: Once | CUTANEOUS | Status: AC
  Administered 2017-09-14: 08:00:00 via TOPICAL
  Filled 2017-09-14: qty 60

## 2017-09-14 MED ORDER — LORAZEPAM 2 MG/ML IJ SOLN
1.0000 mg | Freq: Once | INTRAMUSCULAR | Status: AC
  Administered 2017-09-14: 1 mg via INTRAVENOUS
  Filled 2017-09-14: qty 1

## 2017-09-14 MED ORDER — ONDANSETRON 4 MG PO TBDP
4.0000 mg | ORAL_TABLET | Freq: Once | ORAL | Status: AC | PRN
  Administered 2017-09-14: 4 mg via ORAL
  Filled 2017-09-14: qty 1

## 2017-09-14 MED ORDER — POTASSIUM CHLORIDE CRYS ER 20 MEQ PO TBCR
40.0000 meq | EXTENDED_RELEASE_TABLET | Freq: Once | ORAL | Status: AC
  Administered 2017-09-14: 40 meq via ORAL
  Filled 2017-09-14: qty 2

## 2017-09-14 MED ORDER — POTASSIUM CHLORIDE ER 10 MEQ PO TBCR: 10.0000 meq | EXTENDED_RELEASE_TABLET | Freq: Every day | ORAL | 0 refills | Status: DC

## 2017-09-14 MED ORDER — SODIUM CHLORIDE 0.9 % IV BOLUS
1000.0000 mL | Freq: Once | INTRAVENOUS | Status: AC
  Administered 2017-09-14: 1000 mL via INTRAVENOUS

## 2017-09-14 MED ORDER — ONDANSETRON 4 MG PO TBDP: ORAL_TABLET | ORAL | 0 refills | Status: DC

## 2017-09-14 NOTE — ED Notes (Signed)
Pt continues vomiting, pt also drinking water in lobby, advised not to but continues.

## 2017-09-14 NOTE — ED Provider Notes (Signed)
[facility] EMERGENCY DEPARTMENT Provider Note   CSN: [id number] Arrival date & time: 09/13/17  2119     History   Chief Complaint Chief Complaint  Patient presents with  . Abdominal Pain    HPI Laura Montoya is a 32 y.o. female.  32 year old female with past medical history including anxiety, schizophrenia, GERD who presents with nausea and vomiting.  Patient states that she has been having 2 days of recurrent nausea and vomiting associated with crampy upper abdominal pain. These symptoms are similar to previous episodes.  She has tried Zofran and Tylenol at home without relief.  She does have a history of recurrent problems with nausea and vomiting, follows with gastroenterology and was last seen approximately 6 months ago.  She states she is compliant with a regimen of Zantac and Nexium.  She notes that eating fast food seems to worsen her symptoms.  Symptoms are improved when she takes a hot shower, she states she takes showers every 3 hours at home.  She admits to daily marijuana use.  She denies any fever, urinary symptoms, vaginal bleeding/discharge, or heavy alcohol use.  Of note, she checked into the ED earlier yesterday and was feeling better therefore LWBS.  The history is provided by the patient.  Abdominal Pain      Past Medical History:  Diagnosis Date  . Anxiety   . Asthma   . Dizziness   . Dysmenorrhea   . GERD (gastroesophageal reflux disease)   . Headache   . Mood disorder (HCC)   . Schizophrenia (HCC)   . Scoliosis   . Thrush     Patient Active Problem List   Diagnosis Date Noted  . Cigarette smoker 03/02/2017  . Abnormal CT of the chest 03/02/2017  . COPD  GOLD 0  03/02/2017  . Prolonged QT interval 09/14/2015  . Hypomagnesemia 09/14/2015  . Hypokalemia 09/14/2015  . Tobacco use disorder 09/14/2015  . 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 Vitamin B12 deficiency 08/12/2015  . 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 Folate deficiency 08/12/2015  . Schizophrenia (HCC) 04/30/2015  . Benign paroxysmal positional vertigo 03/04/2015  . Cervicalgia 05/06/2013  . Myofascial muscle pain 02/06/2013  . Sacroiliitis (HCC) 07/09/2012  . Idiopathic scoliosis 06/12/2012  . Lumbar spondylosis with myelopathy 06/12/2012  . Osteopenia 06/12/2012    Past Surgical History:  Procedure Laterality Date  . BREAST SURGERY    . MOUTH SURGERY       OB History   None      Home Medications    Prior to Admission medications   Medication Sig Start Date End Date Taking? Authorizing Provider  acetaminophen (TYLENOL) 325 MG tablet Take 650 mg by mouth every 6 (six) hours as needed for mild pain or fever.    [provider]  Cyanocobalamin (B-12) 2500 MCG TABS Take 1 tablet every 7 (seven) days by mouth.    [provider]  esomeprazole (NEXIUM) 40 MG capsule Take 40 mg daily as needed by mouth.    [provider]  FLUoxetine (PROZAC) 20 MG tablet Take 20 mg daily by mouth.    [provider]  folic acid (FOLVITE) 400 MCG tablet Take 400 mcg every 7 (seven) days by mouth.    [provider]  hydrOXYzine (ATARAX/VISTARIL) 50 MG tablet Take 50 mg by mouth 3 (three) times daily as needed for nausea.    [provider]  nortriptyline (PAMELOR) 50 MG capsule Take 50  mg 2 (two) times a week by mouth.    [provider]  promethazine (PHENERGAN) 25 MG suppository Place 1 suppository (25 mg total) rectally every 6 (six) hours as needed for nausea or vomiting. 02/18/17   Bethel Born, PA-C  ranitidine (ZANTAC) 150 MG tablet Take 1 tablet (150 mg total) by mouth 2 (two) times daily. Patient taking differently: Take 150 mg every other day by mouth.  02/18/17   Bethel Born, PA-C  sucralfate (CARAFATE) 1 g tablet Take 1 tablet (1 g total) by mouth 4 (four) times daily -  with meals and at bedtime. 04/16/17   Horton, Mayer Masker, MD  Thiamine  HCl (B-1 PO) Take 1 capsule every 7 (seven) days by mouth.    [provider]  tiZANidine (ZANAFLEX) 4 MG tablet Take 4 mg by mouth at bedtime.    [provider]  VRAYLAR 3 MG CAPS Take 3 mg by mouth daily.  05/02/16   [provider]    Family History Family History  Problem Relation Age of Onset  . Thyroid disease Mother   . Asthma Mother   . Mental illness Father   . Hyperlipidemia Father   . Healthy Brother   . Healthy Daughter   . Heart disease Paternal Grandfather   . Heart disease Paternal Grandmother     Social History Social History   Tobacco Use  . Smoking status: Current Every Day Smoker    Packs/day: 2.00    Years: 21.00    Pack years: 42.00    Types: Cigarettes  . Smokeless tobacco: Never Used  Substance Use Topics  . Alcohol use: Yes    Alcohol/week: 0.0 oz    Comment: One wine cooler per month  . Drug use: Yes    Types: Marijuana     Allergies   Lavender oil   Review of Systems Review of Systems  Gastrointestinal: Positive for abdominal pain.   All other systems reviewed and are negative except that which was mentioned in HPI   Physical Exam Updated Vital Signs BP 135/80 (BP Location: Right Arm)   Pulse 68   Temp 98.5 F (36.9 C) (Oral)   Resp 16   LMP 08/21/2017   SpO2 99%   Physical Exam  Constitutional: She is oriented to person, place, and time. She appears well-developed and well-nourished. No distress.  HENT:  Head: Normocephalic and atraumatic.  Moist mucous membranes  Eyes: Conjunctivae are normal.  Neck: Neck supple.  Cardiovascular: Normal rate, regular rhythm and normal heart sounds.  No murmur heard. Pulmonary/Chest: Effort normal and breath sounds normal.  Abdominal: Soft. Bowel sounds are normal. She exhibits no distension. There is no tenderness.  Musculoskeletal: She exhibits no edema.  Neurological: She is alert and oriented to person, place, and time.  Fluent speech  Skin: Skin is warm  and dry.  Psychiatric: Judgment normal.  Anxious, mildly tremulous, good eye contact  Nursing note and vitals reviewed.    ED Treatments / Results  Labs (all labs ordered are listed, but only abnormal results are displayed) Labs Reviewed  COMPREHENSIVE METABOLIC PANEL - Abnormal; Notable for the following components:      Result Value   Sodium 132 (*)    Potassium 2.9 (*)    Chloride 86 (*)    ALT 10 (*)    Total Bilirubin 1.4 (*)    Anion gap 18 (*)    All other components within normal limits  CBC - Abnormal; Notable  for the following components:   WBC 13.7 (*)    Platelets 461 (*)    All other components within normal limits  URINALYSIS, ROUTINE W REFLEX MICROSCOPIC - Abnormal; Notable for the following components:   Color, Urine STRAW (*)    Ketones, ur 20 (*)    All other components within normal limits  LIPASE, BLOOD  I-STAT BETA HCG BLOOD, ED ([facility], [facility], [facility] ONLY)  I-STAT TROPONIN, ED    EKG None  Radiology No results found.  Procedures Procedures (including critical care time)  Medications Ordered in ED Medications  LORazepam (ATIVAN) injection 1 mg (has no administration in time range)  capsaicin (ZOSTRIX) 0.025 % cream (has no administration in time range)  gi cocktail (Maalox,Lidocaine,Donnatal) (30 mLs Oral Given 09/13/17 2209)  promethazine (PHENERGAN) tablet 25 mg (25 mg Oral Given 09/13/17 2209)  ondansetron (ZOFRAN-ODT) disintegrating tablet 4 mg (4 mg Oral Given 09/14/17 0238)     Initial Impression / Assessment and Plan / ED Course  I have reviewed the triage vital signs and the nursing notes.  Pertinent labs & imaging results that were available during my care of the patient were reviewed by me and considered in my medical decision making (see chart for details).     Well appearing, anxious but NAD. VS reassuring. No focal abd tenderness. I reviewed her chart which shows many previous visits for similar sx, previous abdominal US negative for  gallbladder pathology.  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 Her labs show K 2.9 , Na 132, Cl 86, AG 18, normal glucose and creatinine. WBC 13.7. UA with no evidence of infection. Pt received his Zofran and later Phenergan and GI cocktail at triage.  I have ordered IV fluid bolus, oral potassium, Ativan, and capsaicin to abdomen. I am signing patient out to oncoming provider. If she is improved and tolerating PO on reassessment, I anticipate discharge. I have extensively counseled pt on the importance of marijuana cessation as I strongly suspect it is contributing to recurrent symptoms.  Final Clinical Impressions(s) / ED Diagnoses   Final diagnoses:  None    ED Discharge Orders    None       Little, Ambrose Finland, MD 09/14/17 [phone]

## 2017-09-14 NOTE — ED Notes (Signed)
Pt reports N/V x2 days. Pt reports more then 10 episodes of vomiting.

## 2017-09-14 NOTE — ED Provider Notes (Signed)
Patient symptoms have improved after medication.  No further vomiting in the emergency department.  Tolerating oral intake.  Initiated potassium replacement.  Counseled on need to stop smoking marijuana.  Given return precautions.    Loren Racer, MD 09/14/17 [phone]

## 2017-10-16 ENCOUNTER — Emergency Department (HOSPITAL_COMMUNITY)
Admission: EM | Admit: 2017-10-16 | Discharge: 2017-10-16 | Disposition: A | Discharge status: Home / Self Care | Payer: Medicaid Other | Attending: Emergency Medicine | Admitting: Emergency Medicine

## 2017-10-16 ENCOUNTER — Encounter (HOSPITAL_COMMUNITY): Payer: Self-pay | Admitting: Emergency Medicine

## 2017-10-16 ENCOUNTER — Other Ambulatory Visit: Payer: Self-pay

## 2017-10-16 DIAGNOSIS — Z5321 Procedure and treatment not carried out due to patient leaving prior to being seen by health care provider: Secondary | ICD-10-CM | POA: Diagnosis not present

## 2017-10-16 DIAGNOSIS — R1110 Vomiting, unspecified: Secondary | ICD-10-CM | POA: Diagnosis not present

## 2017-10-16 LAB — LIPASE, BLOOD: LIPASE: 35 U/L (ref 11–51)

## 2017-10-16 LAB — COMPREHENSIVE METABOLIC PANEL
ALK PHOS: 75 U/L (ref 38–126)
ALT: 8 U/L — AB (ref 14–54)
AST: 14 U/L — AB (ref 15–41)
Albumin: 3.5 g/dL (ref 3.5–5.0)
Anion gap: 13 (ref 5–15)
BUN: 8 mg/dL (ref 6–20)
CHLORIDE: 94 mmol/L — AB (ref 101–111)
CO2: 29 mmol/L (ref 22–32)
Calcium: 9.4 mg/dL (ref 8.9–10.3)
Creatinine, Ser: 0.77 mg/dL (ref 0.44–1.00)
GFR calc Af Amer: >60 mL/min (ref >60)
GFR calc non Af Amer: >60 mL/min (ref >60)
Glucose, Bld: 94 mg/dL (ref 65–99)
Potassium: 3.1 mmol/L — ABNORMAL LOW (ref 3.5–5.1)
Sodium: 136 mmol/L (ref 135–145)
Total Bilirubin: 0.8 mg/dL (ref 0.3–1.2)
Total Protein: 6.2 g/dL — ABNORMAL LOW (ref 6.5–8.1)

## 2017-10-16 LAB — URINALYSIS, ROUTINE W REFLEX MICROSCOPIC
BILIRUBIN URINE: NEGATIVE
GLUCOSE, UA: NEGATIVE mg/dL
KETONES UR: 20 mg/dL — AB
LEUKOCYTES UA: NEGATIVE
Nitrite: NEGATIVE
PROTEIN: NEGATIVE mg/dL
Specific Gravity, Urine: 1.008 (ref 1.005–1.030)
pH: 7 (ref 5.0–8.0)

## 2017-10-16 LAB — CBC
HEMATOCRIT: 39.7 % (ref 36.0–46.0)
Hemoglobin: 13.7 g/dL (ref 12.0–15.0)
MCH: 32.9 pg (ref 26.0–34.0)
MCHC: 34.5 g/dL (ref 30.0–36.0)
MCV: 95.4 fL (ref 78.0–100.0)
PLATELETS: 371 10*3/uL (ref 150–400)
RBC: 4.16 MIL/uL (ref 3.87–5.11)
RDW: 13.5 % (ref 11.5–15.5)
WBC: 11.1 10*3/uL — AB (ref 4.0–10.5)

## 2017-10-16 LAB — I-STAT BETA HCG BLOOD, ED (MC, WL, AP ONLY): I-stat hCG, quantitative: <5 m[IU]/mL (ref <5)

## 2017-10-16 NOTE — ED Provider Notes (Cosign Needed)
Patient placed in Quick Look pathway, seen and evaluated   Chief Complaint: abdominal pain n/v  HPI:   Avon GullyMelissa Ann Hover is a 32 y.o. female with hx of cyclic vomiting here today with n/vd and abdominal pain. Patient reports that the symptoms started 2 days ago and have gotten worse. She just finished her period yesterday. Patient has been taking Zofran without relief.   ROS: GI: n/v, abdominal pain  GI: decreased urine (patient states dehydrated) Physical Exam:  BP 113/70 (BP Location: Right Arm)   Pulse (!) 104   Temp 99.9 F (37.7 C) (Oral)   Resp (!) 22   Ht 5\' 2"  (1.575 m)   Wt 65.8 kg (145 lb)   LMP 10/15/2017   SpO2 97%   BMI 26.52 kg/m    Gen: No distress  Neuro: Awake and Alert  Skin: Warm and dry  Abdomen: soft tender with palpation LUQ, no rebound or guarding.       Initiation of care has begun. The patient has been counseled on the process, plan, and necessity for staying for the completion/evaluation, and the remainder of the medical screening examination    Janne Napoleoneese, Tangela Dolliver M, NP 10/16/17 [phone]

## 2017-10-16 NOTE — ED Triage Notes (Signed)
Pt states 2 days of nausea vomiting. About 7-10 episodes in the past 24 hours. Pt states she is tired of vomiting and anxious. Took a zofran @ 2pm. States some relief to nausea. Pain is to middle of abdomen, states it feels like a cramping. Pt also has diarrhea, that she has had for 2 days as well.

## 2017-10-16 NOTE — ED Notes (Signed)
Pts name called for a room no answer 

## 2017-10-16 NOTE — ED Notes (Signed)
Pt is aware that a urine sample is needed.  

## 2017-10-17 ENCOUNTER — Encounter (HOSPITAL_COMMUNITY): Payer: Self-pay

## 2017-10-17 ENCOUNTER — Emergency Department (HOSPITAL_COMMUNITY)
Admission: EM | Admit: 2017-10-17 | Discharge: 2017-10-17 | Disposition: A | Discharge status: Home / Self Care | Payer: Medicaid Other | Attending: Emergency Medicine | Admitting: Emergency Medicine

## 2017-10-17 DIAGNOSIS — E860 Dehydration: Secondary | ICD-10-CM | POA: Insufficient documentation

## 2017-10-17 DIAGNOSIS — Z79899 Other long term (current) drug therapy: Secondary | ICD-10-CM | POA: Insufficient documentation

## 2017-10-17 DIAGNOSIS — G43A0 Cyclical vomiting, not intractable: Secondary | ICD-10-CM | POA: Diagnosis not present

## 2017-10-17 DIAGNOSIS — J45909 Unspecified asthma, uncomplicated: Secondary | ICD-10-CM | POA: Diagnosis not present

## 2017-10-17 DIAGNOSIS — R079 Chest pain, unspecified: Secondary | ICD-10-CM | POA: Insufficient documentation

## 2017-10-17 DIAGNOSIS — R1115 Cyclical vomiting syndrome unrelated to migraine: Secondary | ICD-10-CM

## 2017-10-17 DIAGNOSIS — E876 Hypokalemia: Secondary | ICD-10-CM | POA: Diagnosis not present

## 2017-10-17 DIAGNOSIS — R1013 Epigastric pain: Secondary | ICD-10-CM | POA: Diagnosis present

## 2017-10-17 DIAGNOSIS — F17210 Nicotine dependence, cigarettes, uncomplicated: Secondary | ICD-10-CM | POA: Insufficient documentation

## 2017-10-17 LAB — URINALYSIS, ROUTINE W REFLEX MICROSCOPIC
Bilirubin Urine: NEGATIVE
GLUCOSE, UA: NEGATIVE mg/dL
Hgb urine dipstick: NEGATIVE
KETONES UR: 80 mg/dL — AB
LEUKOCYTES UA: NEGATIVE
Nitrite: NEGATIVE
PH: 8 (ref 5.0–8.0)
Protein, ur: NEGATIVE mg/dL
SPECIFIC GRAVITY, URINE: 1.015 (ref 1.005–1.030)

## 2017-10-17 LAB — I-STAT TROPONIN, ED: TROPONIN I, POC: 0 ng/mL (ref 0.00–0.08)

## 2017-10-17 LAB — COMPREHENSIVE METABOLIC PANEL
ALBUMIN: 3.6 g/dL (ref 3.5–5.0)
ALT: 9 U/L (ref 0–44)
ANION GAP: 15 (ref 5–15)
AST: 16 U/L (ref 15–41)
Alkaline Phosphatase: 75 U/L (ref 38–126)
BUN: 10 mg/dL (ref 6–20)
CHLORIDE: 88 mmol/L — AB (ref 98–111)
CO2: 31 mmol/L (ref 22–32)
Calcium: 9.3 mg/dL (ref 8.9–10.3)
Creatinine, Ser: 0.85 mg/dL (ref 0.44–1.00)
GFR calc Af Amer: >60 mL/min (ref >60)
GFR calc non Af Amer: >60 mL/min (ref >60)
GLUCOSE: 89 mg/dL (ref 70–99)
POTASSIUM: 3.1 mmol/L — AB (ref 3.5–5.1)
Sodium: 134 mmol/L — ABNORMAL LOW (ref 135–145)
Total Bilirubin: 1.4 mg/dL — ABNORMAL HIGH (ref 0.3–1.2)
Total Protein: 6.3 g/dL — ABNORMAL LOW (ref 6.5–8.1)

## 2017-10-17 LAB — CBC
HEMATOCRIT: 41.2 % (ref 36.0–46.0)
HEMOGLOBIN: 14.2 g/dL (ref 12.0–15.0)
MCH: 32.5 pg (ref 26.0–34.0)
MCHC: 34.5 g/dL (ref 30.0–36.0)
MCV: 94.3 fL (ref 78.0–100.0)
Platelets: 371 10*3/uL (ref 150–400)
RBC: 4.37 MIL/uL (ref 3.87–5.11)
RDW: 13.2 % (ref 11.5–15.5)
WBC: 10.5 10*3/uL (ref 4.0–10.5)

## 2017-10-17 LAB — I-STAT BETA HCG BLOOD, ED (MC, WL, AP ONLY): I-stat hCG, quantitative: <5 m[IU]/mL (ref <5)

## 2017-10-17 LAB — LIPASE, BLOOD: LIPASE: 28 U/L (ref 11–51)

## 2017-10-17 MED ORDER — SODIUM CHLORIDE 0.9 % IV BOLUS
1000.0000 mL | Freq: Once | INTRAVENOUS | Status: AC
  Administered 2017-10-17: 1000 mL via INTRAVENOUS

## 2017-10-17 MED ORDER — ONDANSETRON 4 MG PO TBDP
4.0000 mg | ORAL_TABLET | Freq: Once | ORAL | Status: AC | PRN
  Administered 2017-10-17: 4 mg via ORAL
  Filled 2017-10-17: qty 1

## 2017-10-17 MED ORDER — GI COCKTAIL ~~LOC~~
30.0000 mL | Freq: Once | ORAL | Status: AC
  Administered 2017-10-17: 30 mL via ORAL
  Filled 2017-10-17: qty 30

## 2017-10-17 MED ORDER — CAPSAICIN 0.025 % EX CREA
TOPICAL_CREAM | Freq: Once | CUTANEOUS | Status: AC
  Administered 2017-10-17: 21:00:00 via TOPICAL
  Filled 2017-10-17: qty 60

## 2017-10-17 MED ORDER — PROMETHAZINE HCL 25 MG PO TABS
25.0000 mg | ORAL_TABLET | Freq: Once | ORAL | Status: AC
  Administered 2017-10-17: 25 mg via ORAL
  Filled 2017-10-17: qty 1

## 2017-10-17 NOTE — ED Notes (Signed)
Attempted Iv stick x 2 unsuccessfully  

## 2017-10-17 NOTE — ED Triage Notes (Signed)
Pt presents with 3 day h/o vomiting and chest pain.  Pt reports h/o cyclic vomiting, reports mid-sternal chest pain that began today-describes as a heaviness.  Pt was triaged yesterday and left.

## 2017-10-17 NOTE — Discharge Instructions (Signed)
Please follow-up with your doctor for further evaluation and treatment of your cyclical vomiting.  Attempt to try to quit marijuana use as it is most likely triggering these episodes.  Please return to the emergency department if you develop any new or worsening symptoms.

## 2017-10-17 NOTE — ED Provider Notes (Signed)
[facility] EMERGENCY DEPARTMENT Provider Note   CSN: [id number] Arrival date & time: 10/17/17  1654     History   Chief Complaint Chief Complaint  Patient presents with  . Emesis    HPI Laura Montoya is a 32 y.o. female with history of schizophrenia, GERD, cyclic vomiting syndrome who presents with a 3-day history of emesis, abdominal pain, and intermittent heavy chest pain.  She reports the symptoms are typical of her normal episodes of cyclic vomiting.  She denies any chest pain at this time.  She denies shortness of breath, hematemesis, bloody stools, urinary symptoms.  Patient reports that the first day she had diarrhea, however this is resolved.  She has not been able to keep down even water.  She denies any recent long trips, surgeries, new leg pain or swelling, history of blood clots, exogenous estrogen use, known cancer.  She has taken a Phenergan suppository and Zofran at home with minimal relief. Last used marijuana 4 days ago.  HPI  Past Medical History:  Diagnosis Date  . Anxiety   . Asthma   . Dizziness   . Dysmenorrhea   . GERD (gastroesophageal reflux disease)   . Headache   . Mood disorder (HCC)   . Schizophrenia (HCC)   . Scoliosis   . Thrush     Patient Active Problem List   Diagnosis Date Noted  . Cigarette smoker 03/02/2017  . Abnormal CT of the chest 03/02/2017  . COPD  GOLD 0  03/02/2017  . Prolonged QT interval 09/14/2015  . Hypomagnesemia 09/14/2015  . Hypokalemia 09/14/2015  . Tobacco use disorder 09/14/2015  . 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 Vitamin B12 deficiency 08/12/2015  . 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 Folate deficiency 08/12/2015  . Schizophrenia (HCC) 04/30/2015  . Benign paroxysmal positional vertigo 03/04/2015  . Cervicalgia 05/06/2013  . Myofascial muscle pain 02/06/2013  . Sacroiliitis (HCC) 07/09/2012  . Idiopathic scoliosis 06/12/2012  . Lumbar spondylosis with  myelopathy 06/12/2012  . Osteopenia 06/12/2012    Past Surgical History:  Procedure Laterality Date  . BREAST SURGERY    . MOUTH SURGERY       OB History   None      Home Medications    Prior to Admission medications   Medication Sig Start Date End Date Taking? Authorizing Provider  acetaminophen (TYLENOL) 325 MG tablet Take 650 mg by mouth every 6 (six) hours as needed for mild pain or fever.   Yes [provider]  albuterol (PROVENTIL HFA;VENTOLIN HFA) 108 (90 Base) MCG/ACT inhaler Inhale 1-2 puffs into the lungs every 6 (six) hours as needed for wheezing or shortness of breath.   Yes [provider]  Cyanocobalamin (B-12) 2500 MCG TABS Take 1 tablet by mouth 2 (two) times a week.    Yes [provider]  esomeprazole (NEXIUM) 40 MG capsule Take 40 mg by mouth every other day.    Yes [provider]  FLUoxetine (PROZAC) 20 MG tablet Take 20 mg daily by mouth.   Yes [provider]  folic acid (FOLVITE) 400 MCG tablet Take 400 mcg every 7 (seven) days by mouth.   Yes [provider]  hydrOXYzine (ATARAX/VISTARIL) 50 MG tablet Take 50 mg by mouth 3 (three) times daily as needed for nausea.   Yes [provider]  ondansetron (ZOFRAN ODT) 4 MG disintegrating tablet 4mg  ODT q4 hours prn nausea/vomit 09/14/17  Yes Loren Racer, MD  potassium  chloride (K-DUR) 10 MEQ tablet Take 1 tablet (10 mEq total) by mouth daily. 09/14/17  Yes Loren RacerYelverton, David, MD  promethazine (PHENERGAN) 25 MG suppository Place 1 suppository (25 mg total) rectally every 6 (six) hours as needed for nausea or vomiting. 02/18/17  Yes Bethel BornGekas, Kelly Marie, PA-C  ranitidine (ZANTAC) 150 MG tablet Take 1 tablet (150 mg total) by mouth 2 (two) times daily. Patient taking differently: Take 150 mg every other day by mouth.  02/18/17  Yes Bethel BornGekas, Kelly Marie, PA-C  Thiamine HCl (B-1 PO) Take 1 capsule every 7 (seven) days by mouth.   Yes [provider]    tiZANidine (ZANAFLEX) 4 MG tablet Take 4 mg by mouth at bedtime.   Yes [provider]  VRAYLAR 3 MG CAPS Take 3 mg by mouth daily.  05/02/16  Yes [provider]    Family History Family History  Problem Relation Age of Onset  . Thyroid disease Mother   . Asthma Mother   . Mental illness Father   . Hyperlipidemia Father   . Healthy Brother   . Healthy Daughter   . Heart disease Paternal Grandfather   . Heart disease Paternal Grandmother     Social History Social History   Tobacco Use  . Smoking status: Current Every Day Smoker    Packs/day: 2.00    Years: 21.00    Pack years: 42.00    Types: Cigarettes  . Smokeless tobacco: Never Used  Substance Use Topics  . Alcohol use: Yes    Alcohol/week: 0.0 oz    Comment: One wine cooler per month  . Drug use: Yes    Types: Marijuana     Allergies   Lavender oil   Review of Systems Review of Systems  Constitutional: Negative for chills and fever.  HENT: Negative for facial swelling and sore throat.   Respiratory: Negative for shortness of breath.   Cardiovascular: Positive for chest pain (none at this time).  Gastrointestinal: Positive for abdominal pain, diarrhea (first day, none since), nausea and vomiting. Negative for blood in stool.  Genitourinary: Negative for dysuria.  Musculoskeletal: Positive for back pain.  Skin: Negative for rash and wound.  Neurological: Negative for headaches.  Psychiatric/Behavioral: The patient is not nervous/anxious.      Physical Exam Updated Vital Signs BP 112/77   Pulse 68   Temp 99.3 F (37.4 C) (Oral)   Resp 18   LMP 10/15/2017   SpO2 99%   Physical Exam  Constitutional: She appears well-developed and well-nourished. No distress.  HENT:  Head: Normocephalic and atraumatic.  Mouth/Throat: Oropharynx is clear and moist. No oropharyngeal exudate.  Eyes: Pupils are equal, round, and reactive to light. Conjunctivae are normal. Right eye exhibits no  discharge. Left eye exhibits no discharge. No scleral icterus.  Neck: Normal range of motion. Neck supple. No thyromegaly present.  Cardiovascular: Normal rate, regular rhythm, normal heart sounds and intact distal pulses. Exam reveals no gallop and no friction rub.  No murmur heard. Pulmonary/Chest: Effort normal and breath sounds normal. No stridor. No respiratory distress. She has no wheezes. She has no rales.  Abdominal: Soft. Bowel sounds are normal. She exhibits no distension. There is tenderness in the epigastric area and left upper quadrant. There is CVA tenderness (L, mild). There is no rebound and no guarding.  Musculoskeletal: She exhibits no edema.  Lymphadenopathy:    She has no cervical adenopathy.  Neurological: She is alert. Coordination normal.  Skin: Skin is warm and dry.  No rash noted. She is not diaphoretic. No pallor.  Psychiatric: She has a normal mood and affect.  Nursing note and vitals reviewed.    ED Treatments / Results  Labs (all labs ordered are listed, but only abnormal results are displayed) Labs Reviewed  COMPREHENSIVE METABOLIC PANEL - Abnormal; Notable for the following components:      Result Value   Sodium 134 (*)    Potassium 3.1 (*)    Chloride 88 (*)    Total Protein 6.3 (*)    Total Bilirubin 1.4 (*)    All other components within normal limits  URINALYSIS, ROUTINE W REFLEX MICROSCOPIC - Abnormal; Notable for the following components:   APPearance CLOUDY (*)    Ketones, ur 80 (*)    All other components within normal limits  LIPASE, BLOOD  CBC  I-STAT BETA HCG BLOOD, ED ([facility], [facility], [facility] ONLY)  I-STAT TROPONIN, ED    EKG None  EKG: normal EKG, normal sinus rhythm with sinus arrhythmia, rate 85, RBBB, borderline prolonged QT interval.   Radiology No results found.  Procedures Procedures (including critical care time)  Medications Ordered in ED Medications  ondansetron (ZOFRAN-ODT) disintegrating tablet 4 mg (4 mg Oral Given 10/17/17  1722)  sodium chloride 0.9 % bolus 1,000 mL (0 mLs Intravenous Stopped 10/17/17 2103)  gi cocktail (Maalox,Lidocaine,Donnatal) (30 mLs Oral Given 10/17/17 1918)  promethazine (PHENERGAN) tablet 25 mg (25 mg Oral Given 10/17/17 1918)  capsaicin (ZOSTRIX) 0.025 % cream ( Topical Given 10/17/17 2103)     Initial Impression / Assessment and Plan / ED Course  I have reviewed the triage vital signs and the nursing notes.  Pertinent labs & imaging results that were available during my care of the patient were reviewed by me and considered in my medical decision making (see chart for details).     Patient with cyclical vomiting and abdominal pain resolved in the ED.  Patient did not have chest pain in the ED and suspect this is related to her vomiting symptoms that she has had in the past.  Labs show mild dehydration and mild hypokalemia.  Patient given liter of fluids and symptoms controlled GI cocktail, Phenergan, capsaicin, Zofran ODT.  Patient is feeling much better and was able to sleep.  Her abdominal pain is resolved and my repeat abdominal exam shows no tenderness.  She is tolerating oral fluids prior to discharge.  Patient advised to follow-up with PCP.  She is also advised to try to abstain from marijuana.  She understands that this is a probable trigger of her symptoms.  Strict return precautions discussed.  Patient understands and agrees with plan.  Patient vitals stable throughout ED course and discharged in satisfactory condition.  Final Clinical Impressions(s) / ED Diagnoses   Final diagnoses:  Non-intractable cyclical vomiting with nausea    ED Discharge Orders    None       Verdis Prime 10/17/17 2120    Terrilee Files, MD 10/18/17 1130

## 2017-10-21 ENCOUNTER — Emergency Department (HOSPITAL_COMMUNITY)
Admission: EM | Admit: 2017-10-21 | Discharge: 2017-10-22 | Disposition: A | Discharge status: Home / Self Care | Payer: Medicaid Other | Attending: Emergency Medicine | Admitting: Emergency Medicine

## 2017-10-21 ENCOUNTER — Encounter (HOSPITAL_COMMUNITY): Payer: Self-pay | Admitting: Emergency Medicine

## 2017-10-21 DIAGNOSIS — F17210 Nicotine dependence, cigarettes, uncomplicated: Secondary | ICD-10-CM | POA: Diagnosis not present

## 2017-10-21 DIAGNOSIS — J449 Chronic obstructive pulmonary disease, unspecified: Secondary | ICD-10-CM | POA: Insufficient documentation

## 2017-10-21 DIAGNOSIS — Z79899 Other long term (current) drug therapy: Secondary | ICD-10-CM | POA: Insufficient documentation

## 2017-10-21 DIAGNOSIS — E876 Hypokalemia: Secondary | ICD-10-CM | POA: Diagnosis not present

## 2017-10-21 DIAGNOSIS — F12188 Cannabis abuse with other cannabis-induced disorder: Secondary | ICD-10-CM | POA: Diagnosis not present

## 2017-10-21 DIAGNOSIS — R1110 Vomiting, unspecified: Secondary | ICD-10-CM | POA: Diagnosis present

## 2017-10-21 LAB — URINALYSIS, ROUTINE W REFLEX MICROSCOPIC
BILIRUBIN URINE: NEGATIVE
GLUCOSE, UA: NEGATIVE mg/dL
HGB URINE DIPSTICK: NEGATIVE
KETONES UR: 20 mg/dL — AB
Leukocytes, UA: NEGATIVE
Nitrite: NEGATIVE
PROTEIN: NEGATIVE mg/dL
Specific Gravity, Urine: 1.004 — ABNORMAL LOW (ref 1.005–1.030)
pH: 6 (ref 5.0–8.0)

## 2017-10-21 LAB — COMPREHENSIVE METABOLIC PANEL
ALT: 9 U/L (ref 0–44)
AST: 21 U/L (ref 15–41)
Albumin: 4.1 g/dL (ref 3.5–5.0)
Alkaline Phosphatase: 72 U/L (ref 38–126)
Anion gap: 19 — ABNORMAL HIGH (ref 5–15)
CO2: 24 mmol/L (ref 22–32)
CREATININE: 1.07 mg/dL — AB (ref 0.44–1.00)
Calcium: 10 mg/dL (ref 8.9–10.3)
Chloride: 90 mmol/L — ABNORMAL LOW (ref 98–111)
Glucose, Bld: 117 mg/dL — ABNORMAL HIGH (ref 70–99)
Potassium: 2.9 mmol/L — ABNORMAL LOW (ref 3.5–5.1)
Sodium: 133 mmol/L — ABNORMAL LOW (ref 135–145)
Total Bilirubin: 1.1 mg/dL (ref 0.3–1.2)
Total Protein: 7.1 g/dL (ref 6.5–8.1)

## 2017-10-21 LAB — CBC
HCT: 43.8 % (ref 36.0–46.0)
Hemoglobin: 15.4 g/dL — ABNORMAL HIGH (ref 12.0–15.0)
MCH: 32.4 pg (ref 26.0–34.0)
MCHC: 35.2 g/dL (ref 30.0–36.0)
MCV: 92.2 fL (ref 78.0–100.0)
PLATELETS: 495 10*3/uL — AB (ref 150–400)
RBC: 4.75 MIL/uL (ref 3.87–5.11)
RDW: 12.9 % (ref 11.5–15.5)
WBC: 16 10*3/uL — AB (ref 4.0–10.5)

## 2017-10-21 LAB — LIPASE, BLOOD: Lipase: 22 U/L (ref 11–51)

## 2017-10-21 LAB — I-STAT BETA HCG BLOOD, ED (MC, WL, AP ONLY)

## 2017-10-21 MED ORDER — KETOROLAC TROMETHAMINE 30 MG/ML IJ SOLN
30.0000 mg | Freq: Once | INTRAMUSCULAR | Status: AC
  Administered 2017-10-22: 30 mg via INTRAVENOUS
  Filled 2017-10-21: qty 1

## 2017-10-21 MED ORDER — SODIUM CHLORIDE 0.9 % IV BOLUS
1000.0000 mL | Freq: Once | INTRAVENOUS | Status: AC
  Administered 2017-10-22: 1000 mL via INTRAVENOUS

## 2017-10-21 MED ORDER — POTASSIUM CHLORIDE 10 MEQ/100ML IV SOLN
10.0000 meq | INTRAVENOUS | Status: AC
  Administered 2017-10-22 (×3): 10 meq via INTRAVENOUS
  Filled 2017-10-21 (×3): qty 100

## 2017-10-21 MED ORDER — HALOPERIDOL LACTATE 5 MG/ML IJ SOLN
5.0000 mg | Freq: Once | INTRAMUSCULAR | Status: DC
  Filled 2017-10-21: qty 1

## 2017-10-21 NOTE — ED Triage Notes (Signed)
Pt presents to ED for assessment of vomiting after being diagnosed with cyclical vomiting due to marijuana use.  Patient states she smoked Thursday again and had an episode of vomiting.  Another one Friday, and once again today at 1730.  Patient anxious at triage, shaking, pounding her legs.  Pt c/o back pain and abdominal pain.

## 2017-10-22 ENCOUNTER — Encounter (HOSPITAL_COMMUNITY): Payer: Self-pay | Admitting: Emergency Medicine

## 2017-10-22 MED ORDER — CAPSAICIN 0.025 % EX CREA
TOPICAL_CREAM | Freq: Two times a day (BID) | CUTANEOUS | Status: DC
  Administered 2017-10-22: 02:00:00 via TOPICAL
  Filled 2017-10-22: qty 60

## 2017-10-22 MED ORDER — POTASSIUM CHLORIDE 10 MEQ/100ML IV SOLN
10.0000 meq | Freq: Once | INTRAVENOUS | Status: AC
  Administered 2017-10-22: 10 meq via INTRAVENOUS
  Filled 2017-10-22: qty 100

## 2017-10-22 MED ORDER — POTASSIUM CHLORIDE CRYS ER 20 MEQ PO TBCR
40.0000 meq | EXTENDED_RELEASE_TABLET | Freq: Once | ORAL | Status: AC
  Administered 2017-10-22: 40 meq via ORAL
  Filled 2017-10-22: qty 2

## 2017-10-22 MED ORDER — HALOPERIDOL LACTATE 5 MG/ML IJ SOLN
5.0000 mg | Freq: Once | INTRAMUSCULAR | Status: AC
  Administered 2017-10-22: 5 mg via INTRAVENOUS

## 2017-10-22 MED ORDER — SODIUM CHLORIDE 0.9 % IV BOLUS
1000.0000 mL | Freq: Once | INTRAVENOUS | Status: AC
  Administered 2017-10-22: 1000 mL via INTRAVENOUS

## 2017-10-22 NOTE — ED Notes (Signed)
Patient has had no episodes of vomiting.

## 2017-10-22 NOTE — ED Provider Notes (Signed)
[facility] EMERGENCY DEPARTMENT Provider Note   CSN: [id number] Arrival date & time: 10/21/17  2112     History   Chief Complaint Chief Complaint  Patient presents with  . Emesis    HPI Laura Montoya is a 32 y.o. female.  The history is provided by the patient.  Emesis   This is a recurrent problem. The current episode started more than 2 days ago. The problem occurs 2 to 4 times per day. The problem has not changed since onset.The emesis has an appearance of stomach contents. There has been no fever. Pertinent negatives include no abdominal pain, no arthralgias, no chills, no cough, no diarrhea, no fever, no headaches, no myalgias, no sweats and no URI. Risk factors: marijuana abuse     Past Medical History:  Diagnosis Date  . Anxiety   . Asthma   . Dizziness   . Dysmenorrhea   . GERD (gastroesophageal reflux disease)   . Headache   . Mood disorder (HCC)   . Schizophrenia (HCC)   . Scoliosis   . Thrush     Patient Active Problem List   Diagnosis Date Noted  . Cigarette smoker 03/02/2017  . Abnormal CT of the chest 03/02/2017  . COPD  GOLD 0  03/02/2017  . Prolonged QT interval 09/14/2015  . Hypomagnesemia 09/14/2015  . Hypokalemia 09/14/2015  . Tobacco use disorder 09/14/2015  . 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 Vitamin B12 deficiency 08/12/2015  . 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 Folate deficiency 08/12/2015  . Schizophrenia (HCC) 04/30/2015  . Benign paroxysmal positional vertigo 03/04/2015  . Cervicalgia 05/06/2013  . Myofascial muscle pain 02/06/2013  . Sacroiliitis (HCC) 07/09/2012  . Idiopathic scoliosis 06/12/2012  . Lumbar spondylosis with myelopathy 06/12/2012  . Osteopenia 06/12/2012    Past Surgical History:  Procedure Laterality Date  . BREAST SURGERY    . MOUTH SURGERY       OB History   None      Home Medications    Prior to Admission medications   Medication Sig Start  Date End Date Taking? Authorizing Provider  acetaminophen (TYLENOL) 325 MG tablet Take 650 mg by mouth every 6 (six) hours as needed for mild pain or fever.    [provider]  albuterol (PROVENTIL HFA;VENTOLIN HFA) 108 (90 Base) MCG/ACT inhaler Inhale 1-2 puffs into the lungs every 6 (six) hours as needed for wheezing or shortness of breath.    [provider]  Cyanocobalamin (B-12) 2500 MCG TABS Take 1 tablet by mouth 2 (two) times a week.     [provider]  esomeprazole (NEXIUM) 40 MG capsule Take 40 mg by mouth every other day.     [provider]  FLUoxetine (PROZAC) 20 MG tablet Take 20 mg daily by mouth.    [provider]  folic acid (FOLVITE) 400 MCG tablet Take 400 mcg every 7 (seven) days by mouth.    [provider]  hydrOXYzine (ATARAX/VISTARIL) 50 MG tablet Take 50 mg by mouth 3 (three) times daily as needed for nausea.    [provider]  ondansetron (ZOFRAN ODT) 4 MG disintegrating tablet 4mg  ODT q4 hours prn nausea/vomit 09/14/17   Loren Racer, MD  potassium chloride (K-DUR) 10 MEQ tablet Take 1 tablet (10 mEq total) by mouth daily. 09/14/17   Loren Racer, MD  promethazine (PHENERGAN) 25 MG suppository Place 1 suppository (25 mg total) rectally every 6 (six) hours as needed for  nausea or vomiting. 02/18/17   Bethel BornGekas, Kelly Marie, PA-C  ranitidine (ZANTAC) 150 MG tablet Take 1 tablet (150 mg total) by mouth 2 (two) times daily. Patient taking differently: Take 150 mg every other day by mouth.  02/18/17   Bethel BornGekas, Kelly Marie, PA-C  Thiamine HCl (B-1 PO) Take 1 capsule every 7 (seven) days by mouth.    [provider]  tiZANidine (ZANAFLEX) 4 MG tablet Take 4 mg by mouth at bedtime.    [provider]  VRAYLAR 3 MG CAPS Take 3 mg by mouth daily.  05/02/16   [provider]    Family History Family History  Problem Relation Age of Onset  . Thyroid disease Mother   . Asthma Mother   .  Mental illness Father   . Hyperlipidemia Father   . Healthy Brother   . Healthy Daughter   . Heart disease Paternal Grandfather   . Heart disease Paternal Grandmother     Social History Social History   Tobacco Use  . Smoking status: Current Every Day Smoker    Packs/day: 2.00    Years: 21.00    Pack years: 42.00    Types: Cigarettes  . Smokeless tobacco: Never Used  Substance Use Topics  . Alcohol use: Yes    Alcohol/week: 0.0 oz    Comment: One wine cooler per month  . Drug use: Yes    Types: Marijuana     Allergies   Lavender oil   Review of Systems Review of Systems  Constitutional: Negative for chills and fever.  HENT: Negative for congestion.   Eyes: Negative for photophobia.  Respiratory: Negative for cough and chest tightness.   Cardiovascular: Negative for chest pain, palpitations and leg swelling.  Gastrointestinal: Positive for vomiting. Negative for abdominal pain and diarrhea.  Musculoskeletal: Negative for arthralgias and myalgias.  Neurological: Negative for headaches.  All other systems reviewed and are negative.    Physical Exam Updated Vital Signs BP 127/70   Pulse 73   Temp 99.2 F (37.3 C) (Oral)   Resp 16   Ht 5\' 2"  (1.575 m)   Wt 63.5 kg (140 lb)   LMP 10/15/2017   SpO2 99%   BMI 25.61 kg/m   Physical Exam  Constitutional: She is oriented to person, place, and time. She appears well-developed and well-nourished. No distress.  HENT:  Head: Normocephalic and atraumatic.  Mouth/Throat: No oropharyngeal exudate.  Eyes: Pupils are equal, round, and reactive to light. Conjunctivae are normal.  Neck: Normal range of motion. Neck supple.  Cardiovascular: Normal rate, regular rhythm, normal heart sounds and intact distal pulses.  Pulmonary/Chest: Effort normal and breath sounds normal. No stridor. No respiratory distress. She has no wheezes. She has no rales.  Abdominal: Soft. Bowel sounds are normal. She exhibits no mass. There is no  tenderness. There is no rebound and no guarding.  Musculoskeletal: Normal range of motion.  Neurological: She is alert and oriented to person, place, and time. She displays normal reflexes. She exhibits normal muscle tone.  Skin: Skin is warm and dry. Capillary refill takes less than 2 seconds.  Psychiatric: She has a normal mood and affect.     ED Treatments / Results  Labs (all labs ordered are listed, but only abnormal results are displayed) Results for orders placed or performed during the hospital encounter of 10/21/17  Lipase, blood  Result Value Ref Range   Lipase 22 11 - 51 U/L  Comprehensive metabolic panel  Result Value Ref  Range   Sodium 133 (L) 135 - 145 mmol/L   Potassium 2.9 (L) 3.5 - 5.1 mmol/L   Chloride 90 (L) 98 - 111 mmol/L   CO2 24 22 - 32 mmol/L   Glucose, Bld 117 (H) 70 - 99 mg/dL   BUN <5 (L) 6 - 20 mg/dL   Creatinine, Ser 4.09 (H) 0.44 - 1.00 mg/dL   Calcium 81.1 8.9 - 91.4 mg/dL   Total Protein 7.1 6.5 - 8.1 g/dL   Albumin 4.1 3.5 - 5.0 g/dL   AST 21 15 - 41 U/L   ALT 9 0 - 44 U/L   Alkaline Phosphatase 72 38 - 126 U/L   Total Bilirubin 1.1 0.3 - 1.2 mg/dL   GFR calc non Af Amer >60 >60 mL/min   GFR calc Af Amer >60 >60 mL/min   Anion gap 19 (H) 5 - 15  CBC  Result Value Ref Range   WBC 16.0 (H) 4.0 - 10.5 K/uL   RBC 4.75 3.87 - 5.11 MIL/uL   Hemoglobin 15.4 (H) 12.0 - 15.0 g/dL   HCT 78.2 95.6 - 21.3 %   MCV 92.2 78.0 - 100.0 fL   MCH 32.4 26.0 - 34.0 pg   MCHC 35.2 30.0 - 36.0 g/dL   RDW 08.6 57.8 - 46.9 %   Platelets 495 (H) 150 - 400 K/uL  Urinalysis, Routine w reflex microscopic  Result Value Ref Range   Color, Urine YELLOW YELLOW   APPearance CLEAR CLEAR   Specific Gravity, Urine 1.004 (L) 1.005 - 1.030   pH 6.0 5.0 - 8.0   Glucose, UA NEGATIVE NEGATIVE mg/dL   Hgb urine dipstick NEGATIVE NEGATIVE   Bilirubin Urine NEGATIVE NEGATIVE   Ketones, ur 20 (A) NEGATIVE mg/dL   Protein, ur NEGATIVE NEGATIVE mg/dL   Nitrite NEGATIVE  NEGATIVE   Leukocytes, UA NEGATIVE NEGATIVE  I-Stat beta hCG blood, ED  Result Value Ref Range   I-stat hCG, quantitative <5.0 <5 mIU/mL   Comment 3           No results found.  EKG EKG Interpretation  Date/Time:  Sunday October 22 2017 02:02:57 EDT Ventricular Rate:  79 PR Interval:  118 QRS Duration: 109 QT Interval:  396 QTC Calculation: 454 R Axis:   76 Text Interpretation:  Sinus rhythm RSR' in V1 or V2, right VCD or RVH Confirmed by Nicanor Alcon, Rexine Gowens ([id number]) on 10/22/2017 3:30:40 AM   Radiology No results found.  Procedures Procedures (including critical care time)  Medications Ordered in ED Medications  potassium chloride 10 mEq in 100 mL IVPB (0 mEq Intravenous Stopped 10/22/17 0351)  capsaicin (ZOSTRIX) 0.025 % cream ( Topical Given 10/22/17 0207)  sodium chloride 0.9 % bolus 1,000 mL (0 mLs Intravenous Stopped 10/22/17 0118)  ketorolac (TORADOL) 30 MG/ML injection 30 mg (30 mg Intravenous Given 10/22/17 0006)  sodium chloride 0.9 % bolus 1,000 mL (1,000 mLs Intravenous New Bag/Given 10/22/17 0036)  haloperidol lactate (HALDOL) injection 5 mg (5 mg Intravenous Given 10/22/17 0245)  potassium chloride SA (K-DUR,KLOR-CON) CR tablet 40 mEq (40 mEq Oral Given 10/22/17 0245)  potassium chloride 10 mEq in 100 mL IVPB (0 mEq Intravenous Stopped 10/22/17 0518)   MDM Reviewed: previous chart, nursing note and vitals Reviewed previous: labs Interpretation: labs and ECG (hypokalemia) Total time providing critical care: 30-74 minutes. This excludes time spent performing separately reportable procedures and services.   CRITICAL CARE Performed by: Jasmine Awe Total critical care time: 60 minutes Critical care time was exclusive  of separately billable procedures and treating other patients. Critical care was necessary to treat or prevent imminent or life-threatening deterioration. Critical care was time spent personally by me on the following activities: development of  treatment plan with patient and/or surrogate as well as nursing, discussions with consultants, evaluation of patient's response to treatment, examination of patient, obtaining history from patient or surrogate, ordering and performing treatments and interventions, ordering and review of laboratory studies, ordering and review of radiographic studies, pulse oximetry and re-evaluation of patient's condition.  PO challenged successfully in the ED Final Clinical Impressions(s) / ED Diagnoses   Final diagnoses:  Hypokalemia  Cannabis hyperemesis syndrome concurrent with and due to cannabis abuse (HCC)   Return for pain, numbness, changes in vision or speech, fevers >100.4 unrelieved by medication, shortness of breath, intractable vomiting, or diarrhea, abdominal pain, Inability to tolerate liquids or food, cough, altered mental status or any concerns. No signs of systemic illness or infection. The patient is nontoxic-appearing on exam and vital signs are within normal limits. Will refer to urology for microscopy hematuria as patient is asymptomatic.  I have reviewed the triage vital signs and the nursing notes. Pertinent labs &imaging results that were available during my care of the patient were reviewed by me and considered in my medical decision making (see chart for details).  After history, exam, and medical workup I feel the patient has been appropriately medically screened and is safe for discharge home. Pertinent diagnoses were discussed with the patient. Patient was given return precautions.      Jaxsyn Catalfamo, MD 10/22/17 [phone]

## 2017-11-29 IMAGING — MR MR HEAD W/O CM
11 of 12 series · 42 of 48 positions shown · non-contrast
Comparison: Cervical spine radiographs 05/06/2013.

CLINICAL DATA: 29-year-old female with headache, blurred vision,
face numbness. Initial encounter.

EXAM:
MRI HEAD WITHOUT CONTRAST
TECHNIQUE: Multiplanar, multiecho pulse sequences of the brain and surrounding
structures were obtained without intravenous contrast.

[Series 5: T1 · sagittal · 4.0mm · 0.75mm/px · 1 of 29 slices shown (1 of 2)]
[im 1/29]
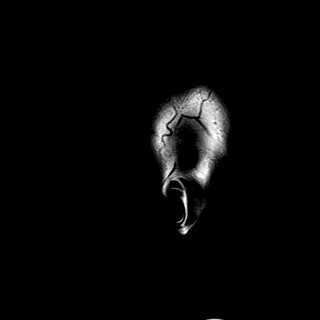

[Series 6: DWI · axial · 3.0mm · 1.44mm/px · z∈[-14,+133]mm · 5 of 92 slices shown (1 of 4)]
[im 1/92]
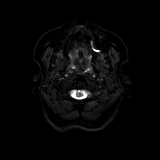
[im 23/92]
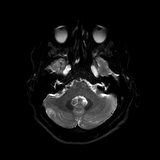
[im 46/92]
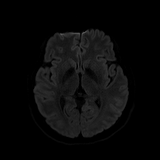
[im 69/92]
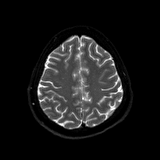
[im 92/92]
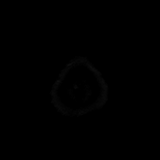

[Series 7: DWI · axial · 3.0mm · 1.44mm/px · z∈[-14,+133]mm · 3 of 46 slices shown (2 of 4)]
[im 1/46]
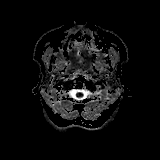
[im 23/46]
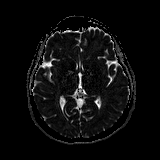
[im 46/46]
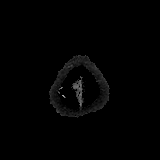

[Series 8: DWI · coronal · 5.0mm · 1.44mm/px · 4 of 64 slices shown (3 of 4)]
[im 1/64]
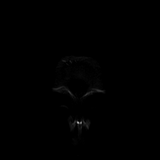
[im 22/64]
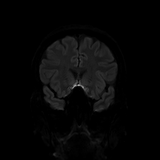
[im 43/64]
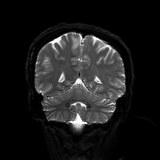
[im 64/64]
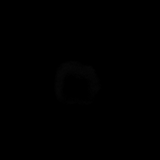

[Series 9: DWI · coronal · 5.0mm · 1.44mm/px · 2 of 32 slices shown (4 of 4)]
[im 1/32]
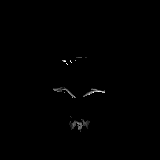
[im 32/32]
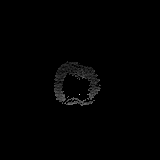

[Series 10: T2 · axial · 4.0mm · 0.36mm/px · z∈[-17,+132]mm · 2 of 30 slices shown (1 of 2)]
[im 1/30]
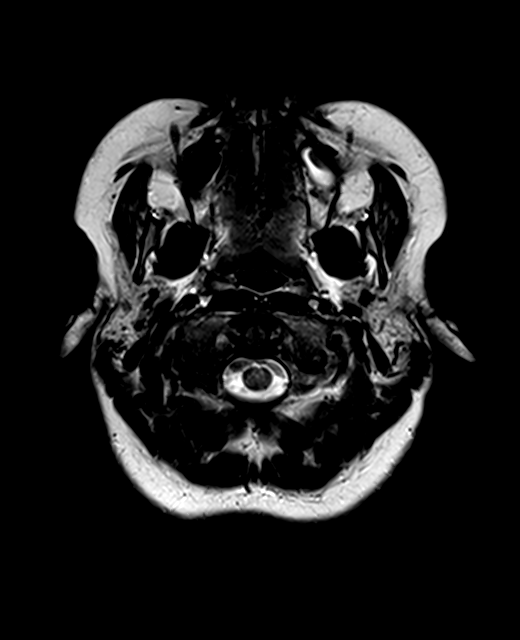
[im 30/30]
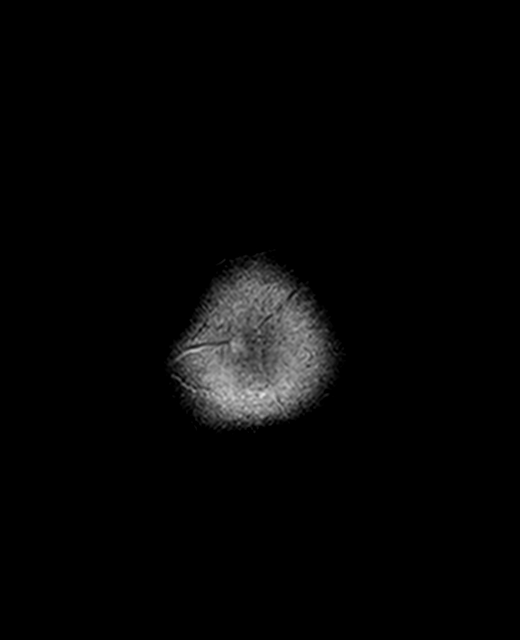

[Series 11: FLAIR · axial · 4.0mm · 0.72mm/px · z∈[-15,+134]mm · 2 of 30 slices shown]
[im 1/30]
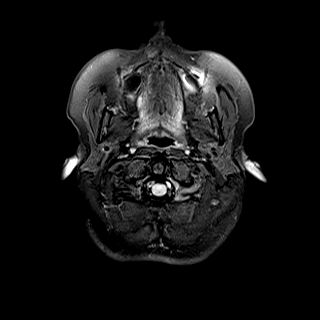
[im 30/30]
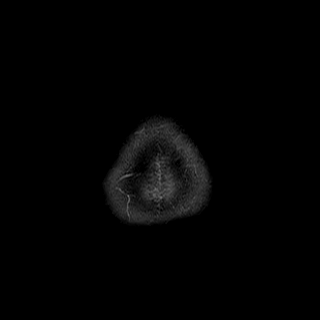

[Series 12: mip_images(sw) · axial · 12.0mm · 0.90mm/px · z∈[-15,+128]mm · 6 of 97 slices shown]
[im 1/97]
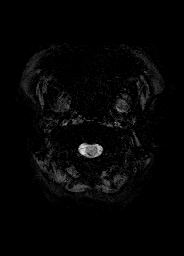
[im 20/97]
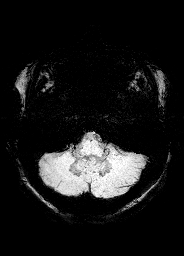
[im 39/97]
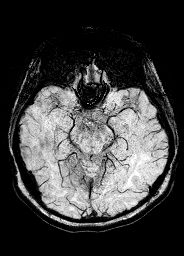
[im 58/97]
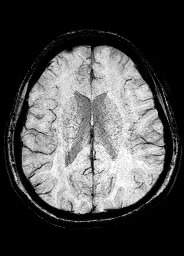
[im 77/97]
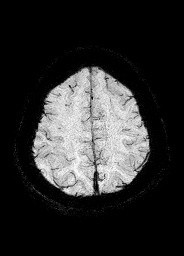
[im 97/97]
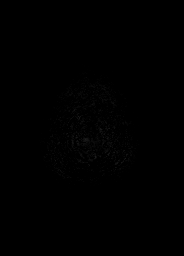

[Series 13: swi_images · axial · 1.5mm · 0.90mm/px · z∈[-20,+133]mm · 6 of 104 slices shown]
[im 1/104]
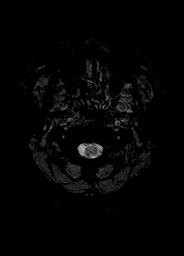
[im 21/104]
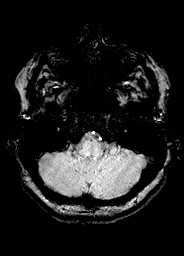
[im 42/104]
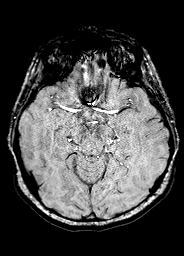
[im 62/104]
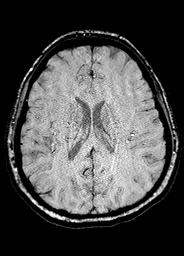
[im 83/104]
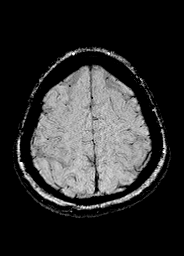
[im 104/104]
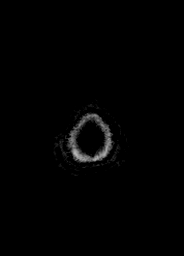

[Series 14: T1 · axial · 1.0mm · 0.90mm/px · z∈[-22,+135]mm · 9 of 160 slices shown (2 of 2)]
[im 1/160]
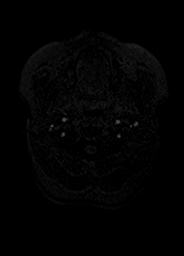
[im 20/160]
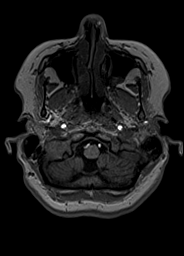
[im 40/160]
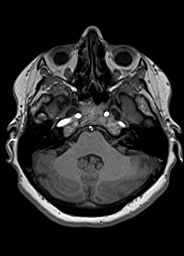
[im 60/160]
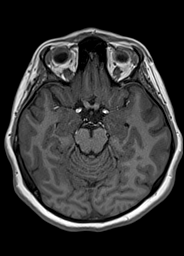
[im 80/160]
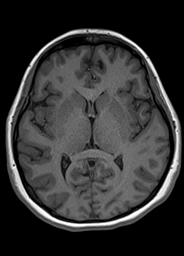
[im 100/160]
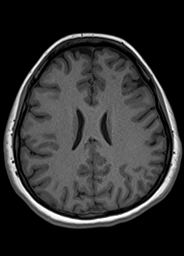
[im 120/160]
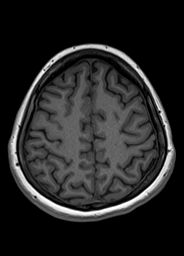
[im 140/160]
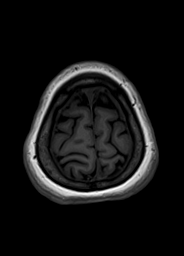
[im 160/160]
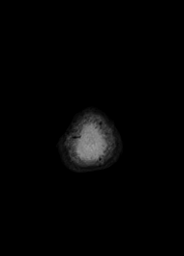

[Series 15: T2 · coronal · 4.5mm · 0.36mm/px · 2 of 33 slices shown (2 of 2)]
[im 1/33]
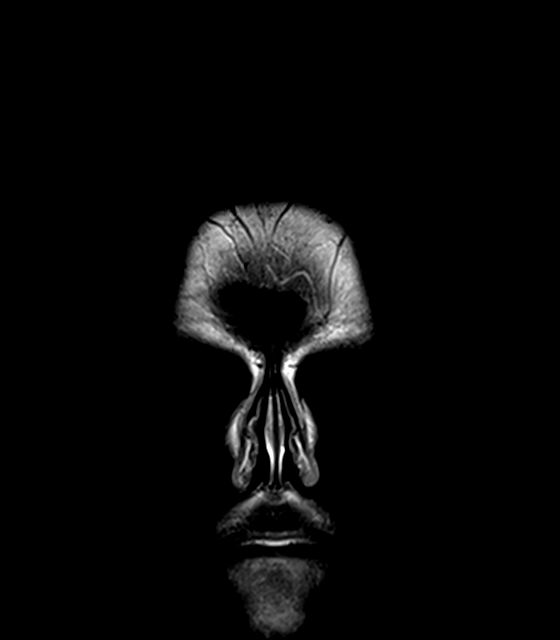
[im 33/33]
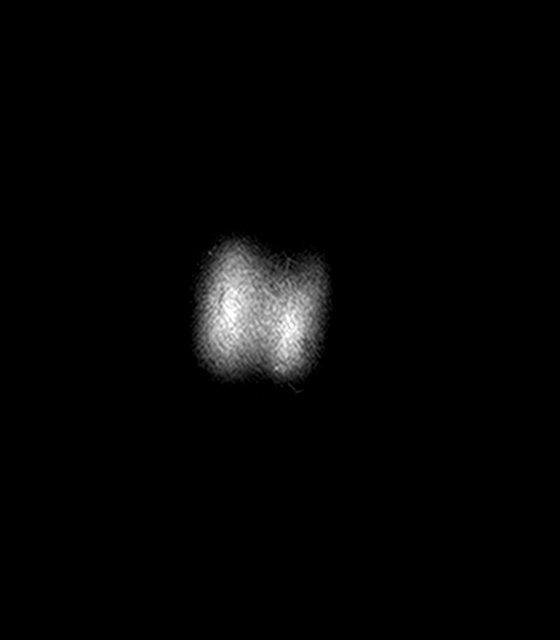

[42 of 48 positions shown; findings below may reference images not displayed]

FINDINGS: Cerebral volume is normal. No restricted diffusion to suggest acute
infarction. No midline shift, mass effect, evidence of mass lesion,
ventriculomegaly, extra-axial collection or acute intracranial
hemorrhage. Cervicomedullary junction and pituitary are within
normal limits. Major intracranial vascular flow voids are within
normal limits. Gray and white matter signal is within normal limits
for age throughout the brain.

Negative visualized cervical spine. Mild bilateral mastoid
effusions, primarily on the left. Negative nasopharynx. Mild
paranasal sinus mucosal thickening. Absent dentition. Orbit and
scalp soft tissues are within normal limits. Visualized bone marrow
signal is within normal limits.
IMPRESSION: 1. Normal noncontrast Normal MRI appearance of the brain.
2. Mild left mastoid and bilateral paranasal sinus inflammatory
changes.

## 2018-01-01 IMAGING — US US EXTREM LOW VENOUS BILAT
1 series · 13 of 24 positions shown · non-contrast
Comparison: None.

CLINICAL DATA: Bilateral lower extremity edema



[Series 1: us extrem low venous bilat · 13 of 54 slices shown]
[im 1/54]
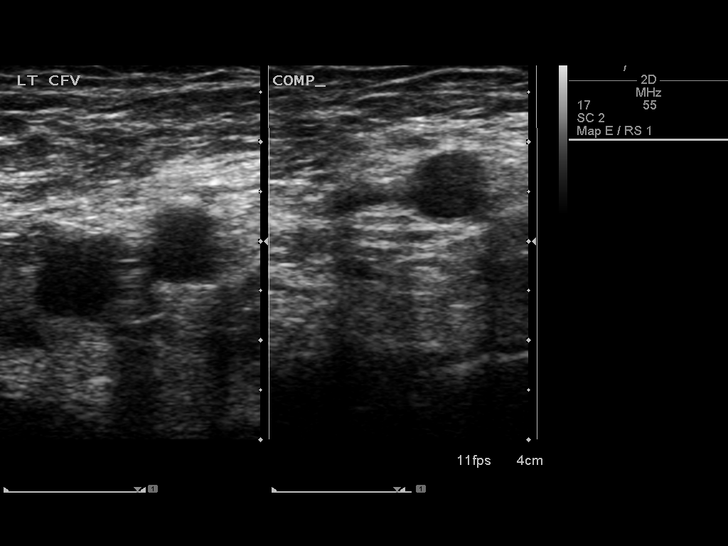
[im 5/54]
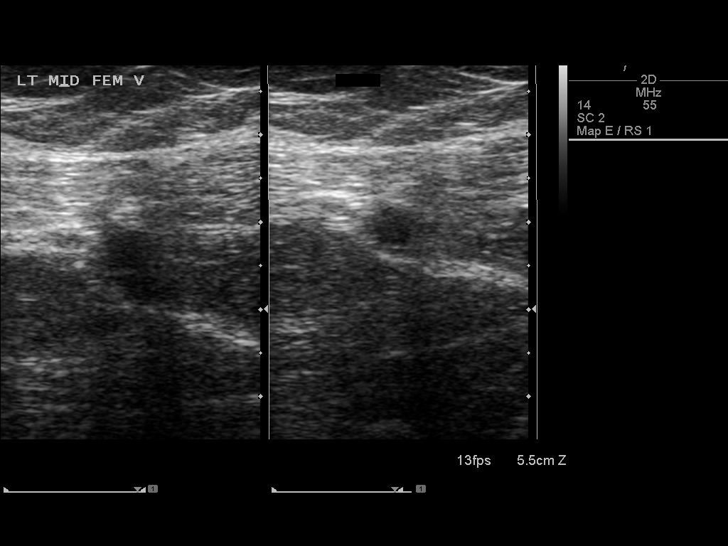
[im 10/54]
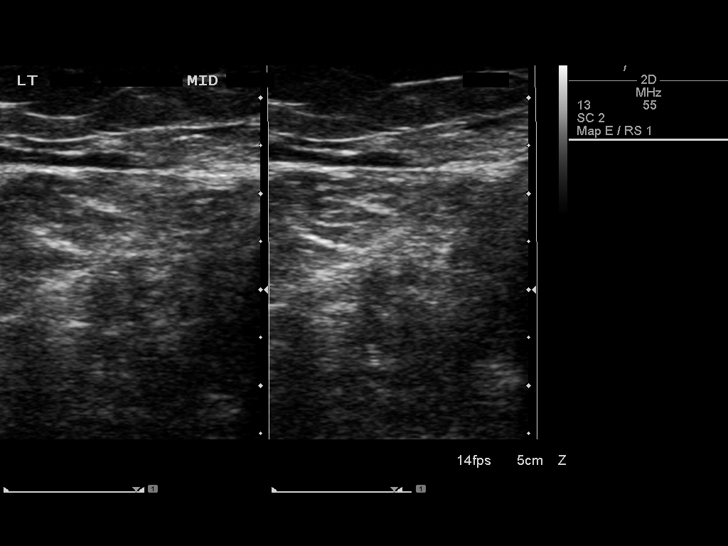
[im 14/54]
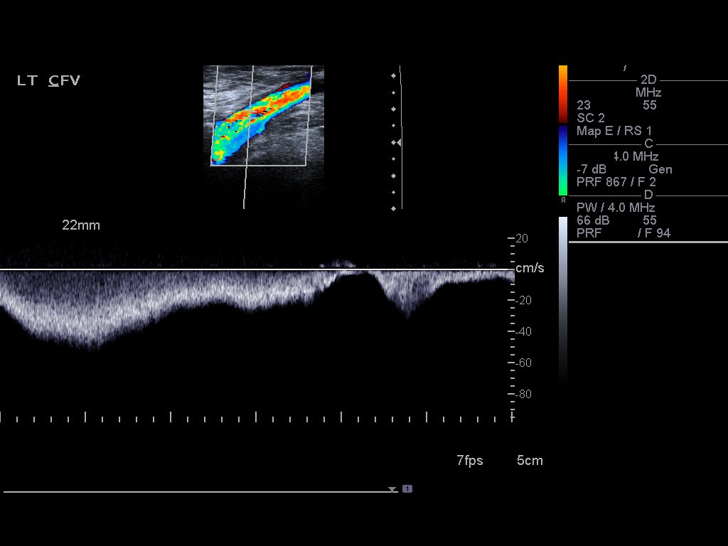
[im 19/54]
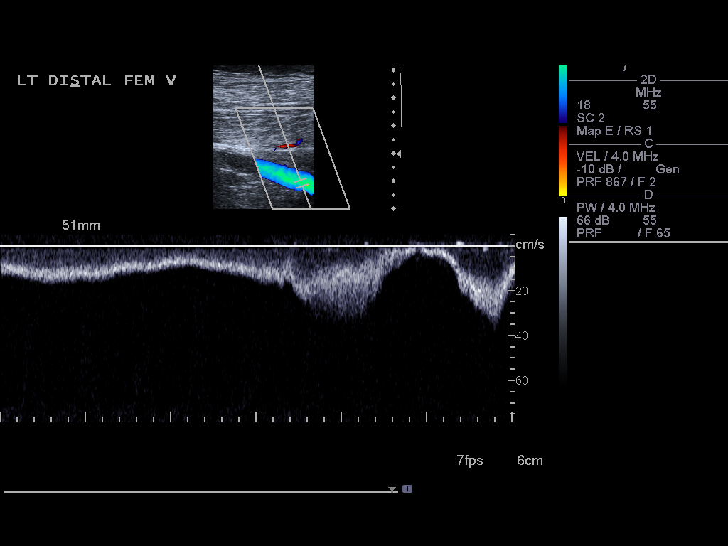
[im 24/54]
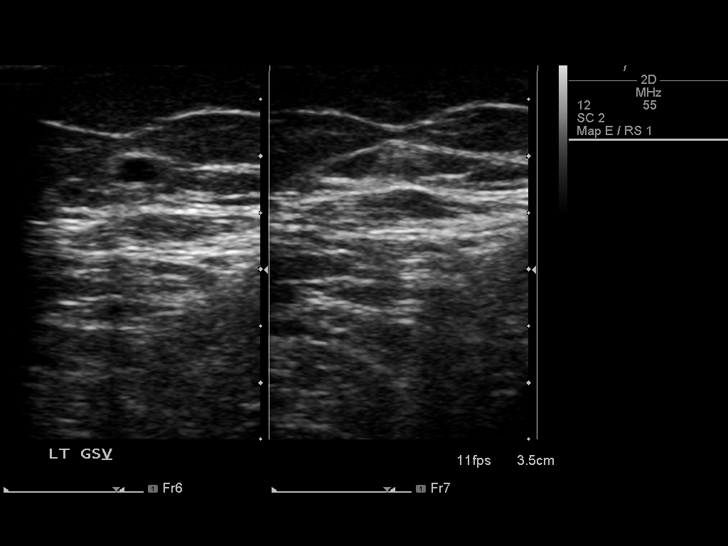
[im 28/54]
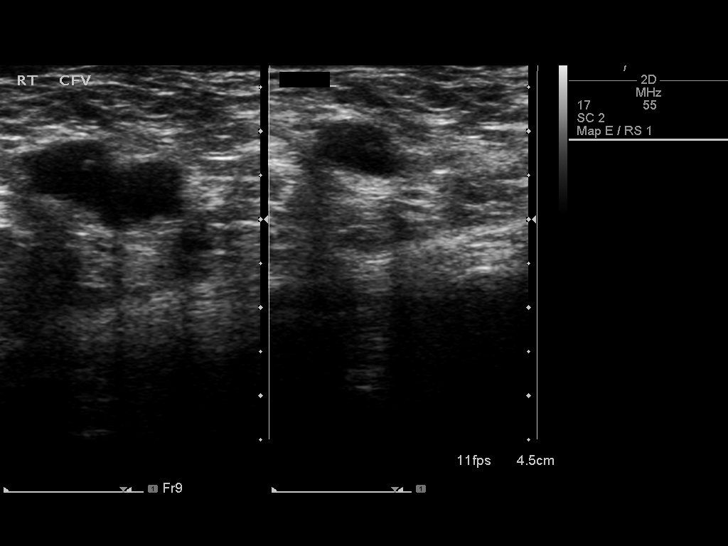
[im 30/54]
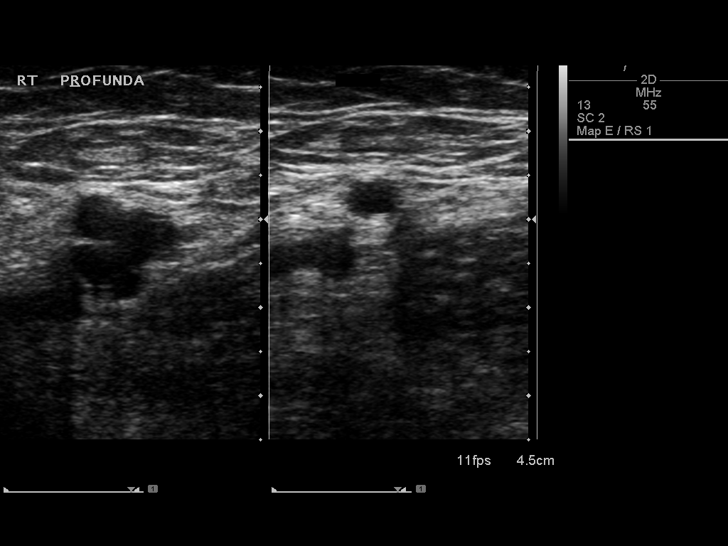
[im 35/54]
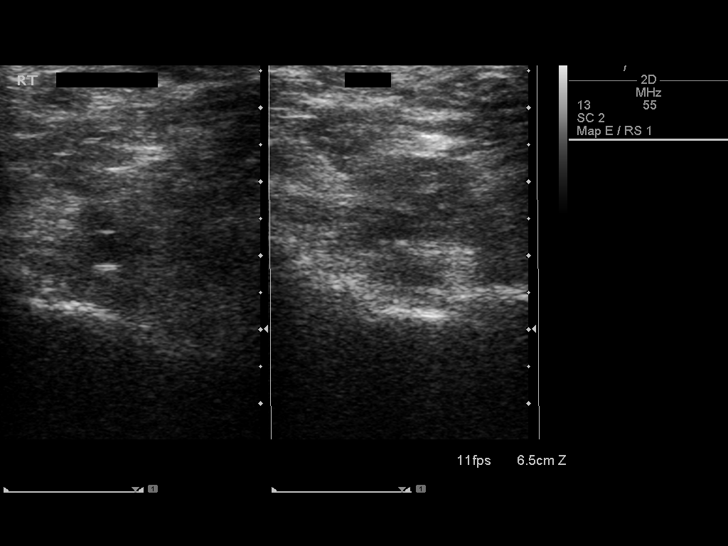
[im 40/54]
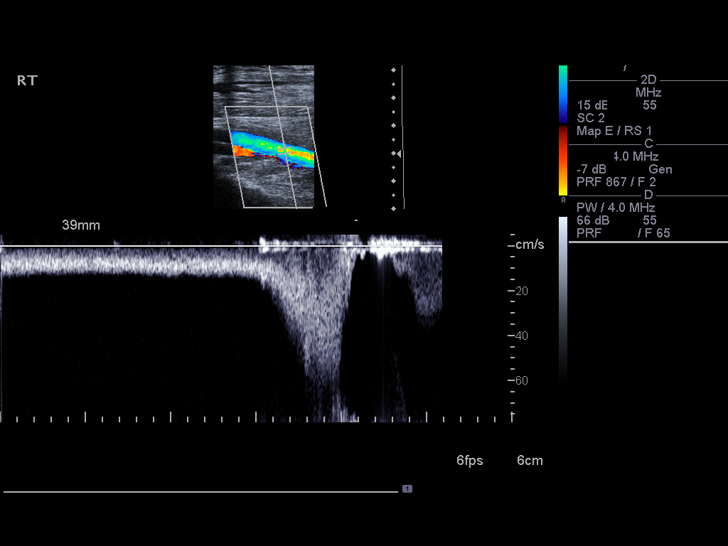
[im 44/54]
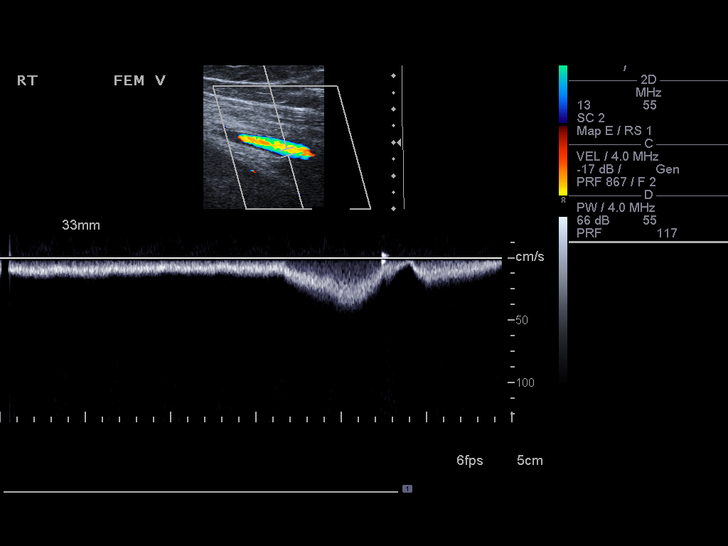
[im 49/54]
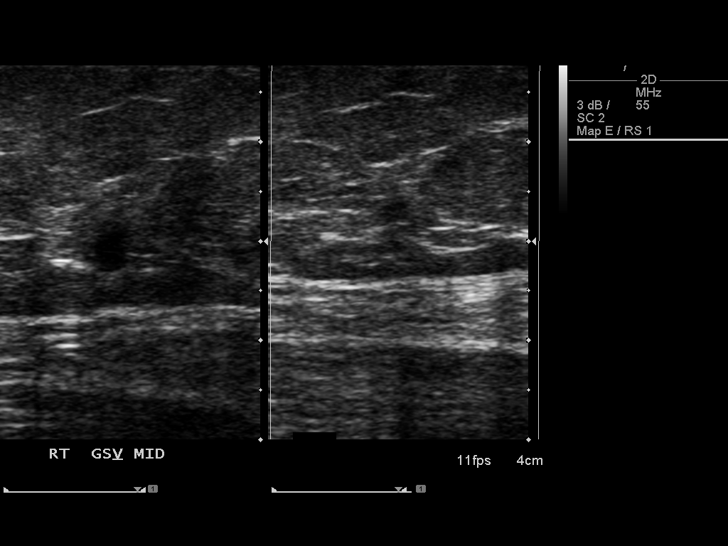
[im 54/54]
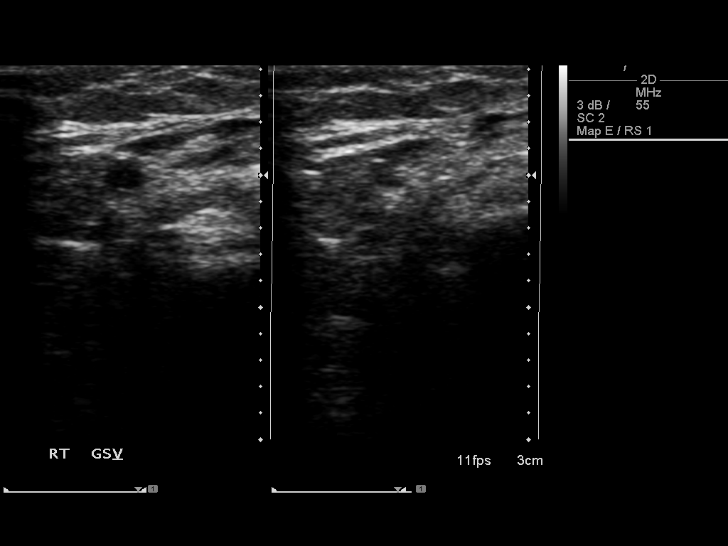

[13 of 24 positions shown; findings below may reference images not displayed]

FINDINGS: RIGHT LOWER EXTREMITY

Common Femoral Vein: No evidence of thrombus. Normal
compressibility, respiratory phasicity and response to augmentation.

Saphenofemoral Junction: No evidence of thrombus. Normal
compressibility and flow on color Doppler imaging.

Profunda Femoral Vein: No evidence of thrombus. Normal
compressibility and flow on color Doppler imaging.

Femoral Vein: No evidence of thrombus. Normal compressibility,
respiratory phasicity and response to augmentation.

Popliteal Vein: No evidence of thrombus. Normal compressibility,
respiratory phasicity and response to augmentation.

Calf Veins: No evidence of thrombus. Normal compressibility and flow
on color Doppler imaging.

Superficial Great Saphenous Vein: No evidence of thrombus. Normal
compressibility and flow on color Doppler imaging.

Venous Reflux:  None.

Other Findings:  None.

LEFT LOWER EXTREMITY

Common Femoral Vein: No evidence of thrombus. Normal
compressibility, respiratory phasicity and response to augmentation.

Saphenofemoral Junction: No evidence of thrombus. Normal
compressibility and flow on color Doppler imaging.

Profunda Femoral Vein: No evidence of thrombus. Normal
compressibility and flow on color Doppler imaging.

Femoral Vein: No evidence of thrombus. Normal compressibility,
respiratory phasicity and response to augmentation.

Popliteal Vein: No evidence of thrombus. Normal compressibility,
respiratory phasicity and response to augmentation.

Calf Veins: No evidence of thrombus. Normal compressibility and flow
on color Doppler imaging.

Superficial Great Saphenous Vein: No evidence of thrombus. Normal
compressibility and flow on color Doppler imaging.

Venous Reflux:  None.

Other Findings:  None.
IMPRESSION: No evidence of deep venous thrombosis.

## 2018-02-06 ENCOUNTER — Other Ambulatory Visit: Payer: Self-pay | Admitting: Physician Assistant

## 2018-02-06 ENCOUNTER — Ambulatory Visit
Admission: RE | Admit: 2018-02-06 | Discharge: 2018-02-06 | Disposition: A | Discharge status: Home / Self Care | Payer: Medicaid Other | Source: Ambulatory Visit | Attending: Physician Assistant | Admitting: Physician Assistant

## 2018-02-06 DIAGNOSIS — M545 Low back pain, unspecified: Secondary | ICD-10-CM

## 2018-02-06 DIAGNOSIS — M79606 Pain in leg, unspecified: Secondary | ICD-10-CM

## 2018-04-25 HISTORY — PX: BREAST SURGERY: SHX581

## 2018-04-25 HISTORY — PX: BREAST ENHANCEMENT SURGERY: SHX7

## 2018-05-25 ENCOUNTER — Other Ambulatory Visit: Payer: Self-pay | Admitting: Physician Assistant

## 2018-05-25 ENCOUNTER — Ambulatory Visit
Admission: RE | Admit: 2018-05-25 | Discharge: 2018-05-25 | Disposition: A | Discharge status: Home / Self Care | Payer: Medicaid Other | Source: Ambulatory Visit | Attending: Physician Assistant | Admitting: Physician Assistant

## 2018-05-25 DIAGNOSIS — M25511 Pain in right shoulder: Secondary | ICD-10-CM

## 2018-05-25 DIAGNOSIS — M25512 Pain in left shoulder: Principal | ICD-10-CM

## 2019-02-19 DIAGNOSIS — G894 Chronic pain syndrome: Secondary | ICD-10-CM | POA: Insufficient documentation

## 2019-02-19 DIAGNOSIS — Z789 Other specified health status: Secondary | ICD-10-CM | POA: Insufficient documentation

## 2019-05-06 ENCOUNTER — Emergency Department (HOSPITAL_COMMUNITY): Payer: Medicaid Other

## 2019-05-06 ENCOUNTER — Encounter (HOSPITAL_COMMUNITY): Payer: Self-pay | Admitting: *Deleted

## 2019-05-06 ENCOUNTER — Other Ambulatory Visit: Payer: Self-pay

## 2019-05-06 ENCOUNTER — Emergency Department (HOSPITAL_COMMUNITY)
Admission: EM | Admit: 2019-05-06 | Discharge: 2019-05-06 | Disposition: A | Discharge status: Home / Self Care | Payer: Medicaid Other | Attending: Emergency Medicine | Admitting: Emergency Medicine

## 2019-05-06 DIAGNOSIS — J449 Chronic obstructive pulmonary disease, unspecified: Secondary | ICD-10-CM | POA: Insufficient documentation

## 2019-05-06 DIAGNOSIS — Z79899 Other long term (current) drug therapy: Secondary | ICD-10-CM | POA: Insufficient documentation

## 2019-05-06 DIAGNOSIS — F17210 Nicotine dependence, cigarettes, uncomplicated: Secondary | ICD-10-CM | POA: Diagnosis not present

## 2019-05-06 DIAGNOSIS — R1011 Right upper quadrant pain: Secondary | ICD-10-CM | POA: Insufficient documentation

## 2019-05-06 DIAGNOSIS — R112 Nausea with vomiting, unspecified: Secondary | ICD-10-CM | POA: Insufficient documentation

## 2019-05-06 DIAGNOSIS — J45909 Unspecified asthma, uncomplicated: Secondary | ICD-10-CM | POA: Diagnosis not present

## 2019-05-06 DIAGNOSIS — R110 Nausea: Secondary | ICD-10-CM | POA: Diagnosis present

## 2019-05-06 DIAGNOSIS — U071 COVID-19: Secondary | ICD-10-CM | POA: Insufficient documentation

## 2019-05-06 DIAGNOSIS — R1013 Epigastric pain: Secondary | ICD-10-CM

## 2019-05-06 DIAGNOSIS — F1210 Cannabis abuse, uncomplicated: Secondary | ICD-10-CM | POA: Diagnosis not present

## 2019-05-06 LAB — URINALYSIS, ROUTINE W REFLEX MICROSCOPIC
Bacteria, UA: NONE SEEN
Bilirubin Urine: NEGATIVE
Glucose, UA: NEGATIVE mg/dL
Hgb urine dipstick: NEGATIVE
Ketones, ur: NEGATIVE mg/dL
Leukocytes,Ua: NEGATIVE
Nitrite: NEGATIVE
Protein, ur: 30 mg/dL — AB
Specific Gravity, Urine: 1.02 (ref 1.005–1.030)
pH: 5 (ref 5.0–8.0)

## 2019-05-06 LAB — COMPREHENSIVE METABOLIC PANEL
ALT: 24 U/L (ref 0–44)
AST: 21 U/L (ref 15–41)
Albumin: 3.5 g/dL (ref 3.5–5.0)
Alkaline Phosphatase: 83 U/L (ref 38–126)
Anion gap: 14 (ref 5–15)
BUN: <5 mg/dL — ABNORMAL LOW (ref 6–20)
CO2: 22 mmol/L (ref 22–32)
Calcium: 9.4 mg/dL (ref 8.9–10.3)
Chloride: 103 mmol/L (ref 98–111)
Creatinine, Ser: 0.75 mg/dL (ref 0.44–1.00)
GFR calc Af Amer: >60 mL/min (ref >60)
GFR calc non Af Amer: >60 mL/min (ref >60)
Glucose, Bld: 154 mg/dL — ABNORMAL HIGH (ref 70–99)
Potassium: 3.3 mmol/L — ABNORMAL LOW (ref 3.5–5.1)
Sodium: 139 mmol/L (ref 135–145)
Total Bilirubin: 0.4 mg/dL (ref 0.3–1.2)
Total Protein: 6.5 g/dL (ref 6.5–8.1)

## 2019-05-06 LAB — LACTIC ACID, PLASMA: Lactic Acid, Venous: 1.7 mmol/L (ref 0.5–1.9)

## 2019-05-06 LAB — CBC
HCT: 41.9 % (ref 36.0–46.0)
Hemoglobin: 14.5 g/dL (ref 12.0–15.0)
MCH: 33 pg (ref 26.0–34.0)
MCHC: 34.6 g/dL (ref 30.0–36.0)
MCV: 95.4 fL (ref 80.0–100.0)
Platelets: 367 10*3/uL (ref 150–400)
RBC: 4.39 MIL/uL (ref 3.87–5.11)
RDW: 12.3 % (ref 11.5–15.5)
WBC: 9.6 10*3/uL (ref 4.0–10.5)
nRBC: 0 % (ref 0.0–0.2)

## 2019-05-06 LAB — I-STAT BETA HCG BLOOD, ED (MC, WL, AP ONLY): I-stat hCG, quantitative: <5 m[IU]/mL

## 2019-05-06 LAB — RESPIRATORY PANEL BY RT PCR (FLU A&B, COVID)
Influenza A by PCR: NEGATIVE
Influenza B by PCR: NEGATIVE
SARS Coronavirus 2 by RT PCR: POSITIVE — AB

## 2019-05-06 LAB — LIPASE, BLOOD: Lipase: 17 U/L (ref 11–51)

## 2019-05-06 MED ORDER — MORPHINE SULFATE (PF) 4 MG/ML IV SOLN
4.0000 mg | Freq: Once | INTRAVENOUS | Status: AC
  Administered 2019-05-06: 13:00:00 4 mg via INTRAVENOUS
  Filled 2019-05-06: qty 1

## 2019-05-06 MED ORDER — SODIUM CHLORIDE 0.9 % IV BOLUS
1000.0000 mL | Freq: Once | INTRAVENOUS | Status: AC
  Administered 2019-05-06: 1000 mL via INTRAVENOUS

## 2019-05-06 MED ORDER — MORPHINE SULFATE (PF) 4 MG/ML IV SOLN
4.0000 mg | Freq: Once | INTRAVENOUS | Status: AC
  Administered 2019-05-06: 12:00:00 4 mg via INTRAVENOUS
  Filled 2019-05-06: qty 1

## 2019-05-06 MED ORDER — SODIUM CHLORIDE 0.9% FLUSH
3.0000 mL | Freq: Once | INTRAVENOUS | Status: AC
  Administered 2019-05-06: 12:00:00 3 mL via INTRAVENOUS

## 2019-05-06 MED ORDER — ONDANSETRON 4 MG PO TBDP
4.0000 mg | ORAL_TABLET | Freq: Once | ORAL | Status: AC | PRN
  Administered 2019-05-06: 11:00:00 4 mg via ORAL
  Filled 2019-05-06: qty 1

## 2019-05-06 MED ORDER — HALOPERIDOL LACTATE 5 MG/ML IJ SOLN
5.0000 mg | Freq: Once | INTRAMUSCULAR | Status: AC
  Administered 2019-05-06: 13:00:00 5 mg via INTRAVENOUS
  Filled 2019-05-06: qty 1

## 2019-05-06 NOTE — ED Provider Notes (Signed)
[facility] EMERGENCY DEPARTMENT Provider Note   CSN: [id number] Arrival date & time: 05/06/19  1036     History Chief Complaint  Patient presents with  . Nausea  . Emesis    Laura Montoya is a 34 y.o. female.  The history is provided by the patient and medical records.  Abdominal Pain Pain location:  Epigastric Pain quality: aching and cramping   Pain radiates to:  Does not radiate Pain severity:  Severe Onset quality:  Gradual Timing:  Constant Progression:  Waxing and waning Chronicity:  Recurrent Context: not alcohol use and not trauma   Relieved by:  Nothing Worsened by:  Palpation and eating Ineffective treatments:  None tried Associated symptoms: nausea and vomiting   Associated symptoms: no chest pain, no chills, no cough, no diarrhea, no dysuria, no fatigue, no fever, no shortness of breath and no vaginal discharge        Past Medical History:  Diagnosis Date  . Anxiety   . Asthma   . Dizziness   . Dysmenorrhea   . GERD (gastroesophageal reflux disease)   . Headache   . Mood disorder (HCC)   . Schizophrenia (HCC)   . Scoliosis   . Thrush     Patient Active Problem List   Diagnosis Date Noted  . Cigarette smoker 03/02/2017  . Abnormal CT of the chest 03/02/2017  . COPD  GOLD 0  03/02/2017  . Prolonged QT interval 09/14/2015  . Hypomagnesemia 09/14/2015  . Hypokalemia 09/14/2015  . Tobacco use disorder 09/14/2015  . 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 Vitamin B12 deficiency 08/12/2015  . 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 Folate deficiency 08/12/2015  . Schizophrenia (HCC) 04/30/2015  . Benign paroxysmal positional vertigo 03/04/2015  . Cervicalgia 05/06/2013  . Myofascial muscle pain 02/06/2013  . Sacroiliitis (HCC) 07/09/2012  . Idiopathic scoliosis 06/12/2012  . Lumbar spondylosis with myelopathy 06/12/2012  . Osteopenia 06/12/2012    Past Surgical History:  Procedure Laterality  Date  . BREAST SURGERY    . MOUTH SURGERY       OB History   No obstetric history on file.     Family History  Problem Relation Age of Onset  . Thyroid disease Mother   . Asthma Mother   . Mental illness Father   . Hyperlipidemia Father   . Healthy Brother   . Healthy Daughter   . Heart disease Paternal Grandfather   . Heart disease Paternal Grandmother     Social History   Tobacco Use  . Smoking status: Current Every Day Smoker    Packs/day: 2.00    Years: 21.00    Pack years: 42.00    Types: Cigarettes  . Smokeless tobacco: Never Used  Substance Use Topics  . Alcohol use: Yes    Alcohol/week: 0.0 standard drinks    Comment: One wine cooler per month  . Drug use: Yes    Types: Marijuana    Home Medications Prior to Admission medications   Medication Sig Start Date End Date Taking? Authorizing Provider  acetaminophen (TYLENOL) 325 MG tablet Take 650 mg by mouth every 6 (six) hours as needed for mild pain or fever.    [provider]  albuterol (PROVENTIL HFA;VENTOLIN HFA) 108 (90 Base) MCG/ACT inhaler Inhale 1-2 puffs into the lungs every 6 (six) hours as needed for wheezing or shortness of breath.    [provider]  Cyanocobalamin (B-12) 2500 MCG TABS Take 1 tablet by  mouth 2 (two) times a week.     [provider]  esomeprazole (NEXIUM) 40 MG capsule Take 40 mg by mouth every other day.     [provider]  FLUoxetine (PROZAC) 20 MG tablet Take 20 mg daily by mouth.    [provider]  folic acid (FOLVITE) 425 MCG tablet Take 400 mcg every 7 (seven) days by mouth.    [provider]  hydrOXYzine (ATARAX/VISTARIL) 50 MG tablet Take 50 mg by mouth 3 (three) times daily as needed for nausea.    [provider]  ondansetron (ZOFRAN ODT) 4 MG disintegrating tablet 4mg  ODT q4 hours prn nausea/vomit 09/14/17   Julianne Rice, MD  potassium chloride (K-DUR) 10 MEQ tablet Take 1 tablet (10 mEq total) by mouth  daily. 09/14/17   Julianne Rice, MD  promethazine (PHENERGAN) 25 MG suppository Place 1 suppository (25 mg total) rectally every 6 (six) hours as needed for nausea or vomiting. 02/18/17   Recardo Evangelist, PA-C  ranitidine (ZANTAC) 150 MG tablet Take 1 tablet (150 mg total) by mouth 2 (two) times daily. Patient taking differently: Take 150 mg every other day by mouth.  02/18/17   Recardo Evangelist, PA-C  Thiamine HCl (B-1 PO) Take 1 capsule every 7 (seven) days by mouth.    [provider]  tiZANidine (ZANAFLEX) 4 MG tablet Take 4 mg by mouth at bedtime.    [provider]  VRAYLAR 3 MG CAPS Take 3 mg by mouth daily.  05/02/16   [provider]    Allergies    Lavender oil  Review of Systems   Review of Systems  Constitutional: Negative for chills, diaphoresis, fatigue and fever.  HENT: Negative for congestion.   Respiratory: Negative for cough, chest tightness, shortness of breath and wheezing.   Cardiovascular: Negative for chest pain and palpitations.  Gastrointestinal: Positive for abdominal pain, nausea and vomiting. Negative for diarrhea.  Genitourinary: Positive for decreased urine volume. Negative for dysuria, flank pain and vaginal discharge.  Musculoskeletal: Negative for back pain, neck pain and neck stiffness.  Neurological: Negative for headaches.  Psychiatric/Behavioral: Negative for agitation.  All other systems reviewed and are negative.   Physical Exam Updated Vital Signs BP 124/81 (BP Location: Right Arm)   Pulse 100   Temp (!) 97 F (36.1 C) (Oral)   Resp (!) 28   Ht 5\' 2"  (1.575 m)   SpO2 99%   BMI 25.61 kg/m   Physical Exam Vitals and nursing note reviewed.  Constitutional:      General: She is not in acute distress.    Appearance: She is well-developed. She is not ill-appearing, toxic-appearing or diaphoretic.  HENT:     Head: Normocephalic and atraumatic.     Right Ear: External ear normal.     Left Ear: External ear  normal.     Nose: Nose normal.     Mouth/Throat:     Mouth: Mucous membranes are moist.     Pharynx: No oropharyngeal exudate.  Eyes:     Conjunctiva/sclera: Conjunctivae normal.     Pupils: Pupils are equal, round, and reactive to light.  Cardiovascular:     Rate and Rhythm: Normal rate and regular rhythm.     Pulses: Normal pulses.     Heart sounds: No murmur.  Pulmonary:     Effort: No respiratory distress.     Breath sounds: No stridor. No wheezing, rhonchi or rales.  Chest:     Chest wall:  No tenderness.  Abdominal:     General: Abdomen is flat. There is no distension.     Tenderness: There is abdominal tenderness. There is no right CVA tenderness, left CVA tenderness or rebound.  Musculoskeletal:        General: No tenderness.     Cervical back: Normal range of motion and neck supple.     Right lower leg: No edema.     Left lower leg: No edema.  Skin:    General: Skin is warm.     Capillary Refill: Capillary refill takes less than 2 seconds.     Findings: No erythema or rash.  Neurological:     General: No focal deficit present.     Mental Status: She is alert and oriented to person, place, and time.     Motor: No abnormal muscle tone.     Coordination: Coordination normal.     Deep Tendon Reflexes: Reflexes are normal and symmetric.  Psychiatric:        Mood and Affect: Mood normal.     ED Results / Procedures / Treatments   Labs (all labs ordered are listed, but only abnormal results are displayed) Labs Reviewed  RESPIRATORY PANEL BY RT PCR (FLU A&B, COVID) - Abnormal; Notable for the following components:      Result Value   SARS Coronavirus 2 by RT PCR POSITIVE (*)    All other components within normal limits  COMPREHENSIVE METABOLIC PANEL - Abnormal; Notable for the following components:   Potassium 3.3 (*)    Glucose, Bld 154 (*)    BUN <5 (*)    All other components within normal limits  URINALYSIS, ROUTINE W REFLEX MICROSCOPIC - Abnormal; Notable for  the following components:   APPearance TURBID (*)    Protein, ur 30 (*)    All other components within normal limits  URINE CULTURE  LIPASE, BLOOD  CBC  LACTIC ACID, PLASMA  LACTIC ACID, PLASMA  I-STAT BETA HCG BLOOD, ED ([facility], [facility], [facility] ONLY)    EKG EKG Interpretation  Date/Time:  Monday May 06 2019 11:58:18 EST Ventricular Rate:  63 PR Interval:    QRS Duration: 116 QT Interval:  435 QTC Calculation: 446 R Axis:   80 Text Interpretation: Sinus rhythm Borderline short PR interval Incomplete right bundle branch block When compared to prior, no significant changes seen. No STEMI Confirmed by Theda Belfast ([id number]) on 05/06/2019 12:39:47 PM   Radiology US Abdomen Limited RUQ  Result Date: 05/06/2019 CLINICAL DATA:  Right upper quadrant pain EXAM: ULTRASOUND ABDOMEN LIMITED RIGHT UPPER QUADRANT COMPARISON:  Ultrasound right upper quadrant February 11, 2017 FINDINGS: Gallbladder: No gallstones or wall thickening visualized. There is no pericholecystic fluid. No sonographic Murphy sign noted by sonographer. Common bile duct: Diameter: 2 mm. No intrahepatic or extrahepatic biliary duct dilatation. Liver: No focal lesion identified. Within normal limits in parenchymal echogenicity. Portal vein is patent on color Doppler imaging with normal direction of blood flow towards the liver. Other: None. IMPRESSION: Study within normal limits. Electronically Signed   By: Bretta Bang III M.D.   On: 05/06/2019 13:24    Procedures Procedures (including critical care time)  Medications Ordered in ED Medications  sodium chloride flush (NS) 0.9 % injection 3 mL (3 mLs Intravenous Given 05/06/19 1148)  ondansetron (ZOFRAN-ODT) disintegrating tablet 4 mg (4 mg Oral Given 05/06/19 1048)  sodium chloride 0.9 % bolus 1,000 mL (0 mLs Intravenous Stopped 05/06/19 1406)  morphine 4 MG/ML injection 4 mg (4  mg Intravenous Given 05/06/19 1147)  haloperidol lactate (HALDOL) injection 5 mg (5 mg Intravenous Given  05/06/19 1246)  morphine 4 MG/ML injection 4 mg (4 mg Intravenous Given 05/06/19 1244)    ED Course  I have reviewed the triage vital signs and the nursing notes.  Pertinent labs & imaging results that were available during my care of the patient were reviewed by me and considered in my medical decision making (see chart for details).    MDM Rules/Calculators/A&P                      Siobahn Victorino Dikenn Bellucci is a 34 y.o. female with a past medical history significant for scoliosis, vertigo, schizophrenia, prolonged QT, COPD, tobacco abuse, GERD, and anxiety who presents with abdominal pain, nausea, vomiting.  Patient reports that her symptoms began today and have been extremely severe.  She denies any trauma.  She says that she has had this in the past but she is unsure what caused it.  She reports no history of gallbladder disease.  She denies any constipation or diarrhea but does report she is having decreased urination.  She feels fatigued.  She denies any chest pain, shortness of breath, palpitations, or cough but does report she gets congestion for the last few days.  She reports the pain is 10 out of 10 and is writhing in pain and vomiting in the room.  On exam, abdomen is tender in the upper abdomen.  No lower abdominal tenderness.  No CVA tenderness or flank tenderness.  Lungs are clear and chest is nontender.  No murmur appreciated.   patient work-up including a right upper quadrant ultrasound and labs.  She will be given fluids and pain medication.  Patient was requesting Haldol for her nausea and vomiting this is helped her in the past however, due to her previous long QTC, will check a EKG.  If EKG is long, will likely provide Ativan instead.  Patient already received Zofran upon arrival to the emergency department in triage.  Also had a shared decision-making conversation we landed on the ultrasound instead of CT scan as she reports has had CTs were reassuring and would rather avoid the  radiation at this time.  Anticipate reassessment after work-up and medications.  EKG shows QTC improved to 464.  Will give Haldol as patient requested with pain medicine.  Patient's work-up returned showing some acute cholecystitis on ultrasound.  Labs also improved from prior.  She was feeling much better after medications.  She requests going home.  Given reassuring work-up and resolution of symptoms, this was felt to be reasonable.  Patient will follow up with PCP for further management.  Patient discharged in good condition with resolution of symptoms.  4:39 PM After patient was already discharged, her Covid test returned positive.  I personally called the patient and heard her voicemail with her name on it.  I left a message describing that she is Covid positive and she can follow-up with her PCP and stay hydrated and rest at home.  She was given further return precautions.  Since she was feeling much better, we still feel she was safe for discharge home however outpatient will know that she has COVID-19 infection.    Final Clinical Impression(s) / ED Diagnoses Final diagnoses:  Epigastric abdominal pain  Non-intractable vomiting with nausea, unspecified vomiting type    Rx / DC Orders ED Discharge Orders    None     Clinical Impression: 1.  Non-intractable vomiting with nausea, unspecified vomiting type   2. Epigastric abdominal pain     Disposition: Discharge  Condition: Good  I have discussed the results, Dx and Tx plan with the pt(& family if present). He/she/they expressed understanding and agree(s) with the plan. Discharge instructions discussed at great length. Strict return precautions discussed and pt &/or family have verbalized understanding of the instructions. No further questions at time of discharge.    Discharge Medication List as of 05/06/2019  2:14 PM      Follow Up: Lonie Peak, PA-C [address] [place] [address] [phone]     [facility] EMERGENCY DEPARTMENT [address] [id number] mc [place] [address] [phone]       Murlean Seelye, Canary Brim, MD 05/06/19 [phone]

## 2019-05-06 NOTE — ED Notes (Signed)
"  Patient verbalizes understanding of discharge instructions. Opportunity for questioning and answers were provided.  pt discharged from ED ."  

## 2019-05-06 NOTE — ED Triage Notes (Signed)
C/o n/v onset this am. C/o abd. Pain

## 2019-05-06 NOTE — Discharge Instructions (Addendum)
Your work-up today was overall reassuring. Your potassium was improved from prior.  The ultrasound not show evidence of gallbladder disease in your urine did not show infection.  As you are feeling better, given your reassuring work-up, we feel you are safe for discharge home.  Please stay hydrated and rest.  If any symptoms change or worsen, please return to nearest emergency room.

## 2019-05-06 NOTE — ED Notes (Signed)
Patient aware she needs to get a urine unable to at this time

## 2019-05-08 LAB — URINE CULTURE

## 2019-07-08 ENCOUNTER — Ambulatory Visit (INDEPENDENT_AMBULATORY_CARE_PROVIDER_SITE_OTHER): Payer: Medicaid Other | Admitting: Neurology

## 2019-07-08 ENCOUNTER — Encounter: Payer: Self-pay | Admitting: Neurology

## 2019-07-08 ENCOUNTER — Other Ambulatory Visit: Payer: Self-pay

## 2019-07-08 VITALS — BP 100/67 | HR 114 | Ht 62.0 in | Wt 137.0 lb

## 2019-07-08 DIAGNOSIS — G5712 Meralgia paresthetica, left lower limb: Secondary | ICD-10-CM

## 2019-07-08 DIAGNOSIS — E5111 Dry beriberi: Secondary | ICD-10-CM | POA: Diagnosis not present

## 2019-07-08 DIAGNOSIS — R202 Paresthesia of skin: Secondary | ICD-10-CM | POA: Diagnosis not present

## 2019-07-08 DIAGNOSIS — G63 Polyneuropathy in diseases classified elsewhere: Secondary | ICD-10-CM | POA: Diagnosis not present

## 2019-07-08 NOTE — Progress Notes (Signed)
[facility] Clinic Note - Initial Visit   Date: 07/08/19  Laura Montoya MRN: [id number] DOB: 1985-08-23   Dear Laura Peak, PA-C:  Thank you for your kind referral of Laura Montoya for consultation of hand pain. Although her history is well known to you, please allow [name] to reiterate it for the purpose of our medical record. The patient was accompanied to the clinic by self.    History of Present Illness: Laura Montoya is a 34 y.o. 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  She was previously evaluated here by myself in 2017 - 2018 for nutritional deficiency neuropathy (vitamin B12, vitamin B1, and folic acid) in the setting of malnutrition.  She was started on supplementation and rehab and responded well with respect to strength and balance.  Her paresthesias improved in the hands, but remained in the feet and lower legs.   She was doing well until around September 2020, she woke up with red, swollen hands and hands curled up.  This was associated with some numbness/tingling.  It lasted about 4 days and then reoccurred in November.   RF was elevated at 50.1, rheumatology did not feel she had RA.   She was offered a course of prednisone which completely resolved her symptoms. NCS/EMG of the arms in January 2021 performed at [facility] was reportedly normal, no evidence of carpal tunnel syndrome of neuropathy.  Currently, she is doing well and does not have any numbness/tingling in the hands.  She continues have have numbness in the lower legs and feet.  She also complains of left lateral thigh numbness which is constant and annoying.  No weakness in the hands or feet.  She walks unassisted.   I have reviewed PCP, rheumatology, and my previous notes.   Out-side paper records, electronic medical record, and images have been reviewed where available  and summarized as:  Lab Results  Component Value Date   HGBA1C 4.9 05/27/2015   Lab Results  Component Value Date   VITAMINB12 >1500 (H) 08/12/2015   Lab Results  Component Value Date   TSH 1.149 05/01/2015   Lab Results  Component Value Date   ESRSEDRATE 25 (H) 06/25/2015    Past Medical History:  Diagnosis Date  . Anxiety   . Asthma   . Dizziness   . Dysmenorrhea   . GERD (gastroesophageal reflux disease)   . Headache   . Mood disorder (HCC)   . Schizophrenia (HCC)   . Scoliosis   . Thrush     Past Surgical History:  Procedure Laterality Date  . BREAST SURGERY    . MOUTH SURGERY       Medications:  Outpatient Encounter Medications as of 07/08/2019  Medication Sig  . acetaminophen (TYLENOL) 325 MG tablet Take 650 mg by mouth every 6 (six) hours as needed for mild pain or fever.  Marland Kitchen albuterol (PROVENTIL HFA;VENTOLIN HFA) 108 (90 Base) MCG/ACT inhaler Inhale 1-2 puffs into the lungs every 6 (six) hours as needed for wheezing or shortness of breath.  . Cyanocobalamin (B-12) 2500 MCG TABS Take 1 tablet by mouth 2 (two) times a week.   . esomeprazole (NEXIUM) 40 MG capsule Take 40 mg by mouth every other day.   Marland Kitchen FLUoxetine (PROZAC) 20 MG tablet Take 20 mg daily by mouth.  . folic acid (FOLVITE) 400 MCG tablet Take 400 mcg every 7 (seven) days by mouth.  Marland Kitchen  hydrOXYzine (ATARAX/VISTARIL) 50 MG tablet Take 50 mg by mouth 3 (three) times daily as needed for nausea.  . ondansetron (ZOFRAN ODT) 4 MG disintegrating tablet 4mg  ODT q4 hours prn nausea/vomit  . potassium chloride (K-DUR) 10 MEQ tablet Take 1 tablet (10 mEq total) by mouth daily.  . promethazine (PHENERGAN) 25 MG suppository Place 1 suppository (25 mg total) rectally every 6 (six) hours as needed for nausea or vomiting.  . ranitidine (ZANTAC) 150 MG tablet Take 1 tablet (150 mg total) by mouth 2 (two) times daily. (Patient taking differently: Take 150 mg every other day by mouth. )  . Thiamine HCl (B-1 PO) Take 1  capsule every 7 (seven) days by mouth.  Marland Kitchen tiZANidine (ZANAFLEX) 4 MG tablet Take 4 mg by mouth at bedtime.  Marland Kitchen VRAYLAR 3 MG CAPS Take 3 mg by mouth daily.    No facility-administered encounter medications on file as of 07/08/2019.    Allergies:  Allergies  Allergen Reactions  . Lavender Oil Rash    Family History: Family History  Problem Relation Age of Onset  . Thyroid disease Mother   . Asthma Mother   . Mental illness Father   . Hyperlipidemia Father   . Healthy Brother   . Healthy Daughter   . Heart disease Paternal Grandfather   . Heart disease Paternal Grandmother     Social History: Social History   Tobacco Use  . Smoking status: Current Every Day Smoker    Packs/day: 2.00    Years: 21.00    Pack years: 42.00    Types: Cigarettes  . Smokeless tobacco: Never Used  Substance Use Topics  . Alcohol use: Yes    Alcohol/week: 0.0 standard drinks    Comment: One wine cooler per month  . Drug use: Yes    Types: Marijuana   Social History   Social History Narrative   Has not had caffeine for 1 month 05/27/15.  Lives with parents and daughter in a one story home.  Does not work.  Has a disability hearing coming up on July 16, 2015.     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     Education: GED      right handed   Lives in one story home    Lives with family    Vital Signs:  Ht 5\' 2"  (1.575 m)   Wt 137 lb (62.1 kg)   BMI 25.06 kg/m   Neurological Exam: MENTAL STATUS including orientation to time, place, person, recent and remote memory, attention span and concentration, language, and fund of knowledge is normal.  Speech is not dysarthric.  CRANIAL NERVES: II:  No visual field defects.     III-IV-VI: Pupils equal round.  Normal conjugate, extra-ocular eye movements in all directions of gaze.  No nystagmus.  No ptosis.   V:  Normal facial sensation.    VII:  Normal facial symmetry and movements.   VIII:  Normal hearing and vestibular  function.   IX-X:  Normal palatal movement.   XI:  Normal shoulder shrug and head rotation.   XII:  Normal tongue strength and range of motion, no deviation or fasciculation.  MOTOR:  No atrophy, fasciculations or abnormal movements.  No pronator drift.   Upper Extremity:  Right  Left  Deltoid  5/5   5/5   Biceps  5/5   5/5   Triceps  5/5   5/5   Infraspinatus 5/5  5/5  Medial pectoralis  5/5  5/5  Wrist extensors  5/5   5/5   Wrist flexors  5/5   5/5   Finger extensors  5/5   5/5   Finger flexors  5/5   5/5   Dorsal interossei  5/5   5/5   Abductor pollicis  5/5   5/5   Tone (Ashworth scale)  0  0   Lower Extremity:  Right  Left  Hip flexors  5/5   5/5   Hip extensors  5/5   5/5   Adductor 5/5  5/5  Abductor 5/5  5/5  Knee flexors  5/5   5/5   Knee extensors  5/5   5/5   Dorsiflexors  5/5   5/5   Plantarflexors  5/5   5/5   Toe extensors  5/5   5/5   Toe flexors  5/5   5/5   Tone (Ashworth scale)  0  0   MSRs:  Right        Left                  brachioradialis 2+  2+  biceps 2+  2+  triceps 2+  2+  patellar tr  tr  ankle jerk 0  0  Hoffman no  no  plantar response down  down   SENSORY:  Vibration, temperature and pin prick reduced distal to knees bilaterally  Sensation intact to all modalities in the hands. Romberg's sign absent.   COORDINATION/GAIT: Normal finger-to- nose-finger.  Intact rapid alternating movements bilaterally.  Able to rise from a chair without using arms.  Gait narrow based and stable. Tandem and stressed gait intact.    IMPRESSION: 1. Bilateral hand dysesthesias in the setting of edema and erythema suggest inflammatory-mediated process, moreso than primary neuropathy or nerve entrapment.  She reports her symptoms have resolved with prednisone.  NCS/EMG was performed elsewhere and normal.  With her symptoms improved, no additional tested warranted.  2. Nutritional neuropathy, she is compliant with B12, folic acid, and B1 supplements.  Exam  shows residual sensory deficits distal to knees.  Her motor strength and proprioception is largely intact.   3. 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  Patient reassured there is nothing worrisome, symptoms are annoyance and will not cause weakness.      Thank you for allowing me to participate in patient's care.  If I can answer any additional questions, I would be pleased to do so.    Sincerely,    Kshawn Canal K. Allena Katz, DO

## 2019-07-18 ENCOUNTER — Emergency Department (HOSPITAL_COMMUNITY)
Admission: EM | Admit: 2019-07-18 | Discharge: 2019-07-18 | Disposition: A | Discharge status: Home / Self Care | Payer: Medicaid Other | Attending: Emergency Medicine | Admitting: Emergency Medicine

## 2019-07-18 ENCOUNTER — Other Ambulatory Visit: Payer: Self-pay

## 2019-07-18 ENCOUNTER — Encounter (HOSPITAL_COMMUNITY): Payer: Self-pay | Admitting: Emergency Medicine

## 2019-07-18 DIAGNOSIS — R1110 Vomiting, unspecified: Secondary | ICD-10-CM | POA: Diagnosis present

## 2019-07-18 DIAGNOSIS — Z5321 Procedure and treatment not carried out due to patient leaving prior to being seen by health care provider: Secondary | ICD-10-CM | POA: Insufficient documentation

## 2019-07-18 LAB — URINALYSIS, ROUTINE W REFLEX MICROSCOPIC
Bilirubin Urine: NEGATIVE
Glucose, UA: NEGATIVE mg/dL
Hgb urine dipstick: NEGATIVE
Ketones, ur: NEGATIVE mg/dL
Leukocytes,Ua: NEGATIVE
Nitrite: NEGATIVE
Protein, ur: 30 mg/dL — AB
Specific Gravity, Urine: 1.017 (ref 1.005–1.030)
pH: 5 (ref 5.0–8.0)

## 2019-07-18 LAB — I-STAT BETA HCG BLOOD, ED (MC, WL, AP ONLY): I-stat hCG, quantitative: <5 m[IU]/mL (ref <5)

## 2019-07-18 LAB — CBC
HCT: 42.8 % (ref 36.0–46.0)
Hemoglobin: 14.6 g/dL (ref 12.0–15.0)
MCH: 32.2 pg (ref 26.0–34.0)
MCHC: 34.1 g/dL (ref 30.0–36.0)
MCV: 94.5 fL (ref 80.0–100.0)
Platelets: 436 10*3/uL — ABNORMAL HIGH (ref 150–400)
RBC: 4.53 MIL/uL (ref 3.87–5.11)
RDW: 12.3 % (ref 11.5–15.5)
WBC: 11.9 10*3/uL — ABNORMAL HIGH (ref 4.0–10.5)
nRBC: 0 % (ref 0.0–0.2)

## 2019-07-18 LAB — COMPREHENSIVE METABOLIC PANEL
ALT: 270 U/L — ABNORMAL HIGH (ref 0–44)
AST: 186 U/L — ABNORMAL HIGH (ref 15–41)
Albumin: 3.8 g/dL (ref 3.5–5.0)
Alkaline Phosphatase: 91 U/L (ref 38–126)
Anion gap: 15 (ref 5–15)
BUN: <5 mg/dL — ABNORMAL LOW (ref 6–20)
CO2: 22 mmol/L (ref 22–32)
Calcium: 9.6 mg/dL (ref 8.9–10.3)
Chloride: 98 mmol/L (ref 98–111)
Creatinine, Ser: 0.79 mg/dL (ref 0.44–1.00)
GFR calc Af Amer: >60 mL/min (ref >60)
GFR calc non Af Amer: >60 mL/min (ref >60)
Glucose, Bld: 132 mg/dL — ABNORMAL HIGH (ref 70–99)
Potassium: 3.5 mmol/L (ref 3.5–5.1)
Sodium: 135 mmol/L (ref 135–145)
Total Bilirubin: 0.9 mg/dL (ref 0.3–1.2)
Total Protein: 7.1 g/dL (ref 6.5–8.1)

## 2019-07-18 LAB — LIPASE, BLOOD: Lipase: 18 U/L (ref 11–51)

## 2019-07-18 MED ORDER — SODIUM CHLORIDE 0.9% FLUSH: 3.0000 mL | Freq: Once | INTRAVENOUS | Status: DC

## 2019-07-18 NOTE — ED Notes (Signed)
Called x3 for room with no answer 

## 2019-07-18 NOTE — ED Triage Notes (Addendum)
Pt reports vomiting and diarrhea that began last night, reports hx of :cyclical vomiting" that she took 2 doses of zofran for without relief.

## 2019-08-27 ENCOUNTER — Other Ambulatory Visit: Payer: Self-pay

## 2019-08-27 ENCOUNTER — Encounter (HOSPITAL_COMMUNITY): Payer: Self-pay | Admitting: Emergency Medicine

## 2019-08-27 ENCOUNTER — Emergency Department (HOSPITAL_COMMUNITY): Payer: Medicaid Other

## 2019-08-27 ENCOUNTER — Emergency Department (HOSPITAL_COMMUNITY)
Admission: EM | Admit: 2019-08-27 | Discharge: 2019-08-27 | Disposition: A | Discharge status: Home / Self Care | Payer: Medicaid Other | Attending: Emergency Medicine | Admitting: Emergency Medicine

## 2019-08-27 DIAGNOSIS — Z79899 Other long term (current) drug therapy: Secondary | ICD-10-CM | POA: Insufficient documentation

## 2019-08-27 DIAGNOSIS — J45909 Unspecified asthma, uncomplicated: Secondary | ICD-10-CM | POA: Insufficient documentation

## 2019-08-27 DIAGNOSIS — F17210 Nicotine dependence, cigarettes, uncomplicated: Secondary | ICD-10-CM | POA: Insufficient documentation

## 2019-08-27 DIAGNOSIS — J449 Chronic obstructive pulmonary disease, unspecified: Secondary | ICD-10-CM | POA: Diagnosis not present

## 2019-08-27 DIAGNOSIS — M25531 Pain in right wrist: Secondary | ICD-10-CM | POA: Diagnosis present

## 2019-08-27 HISTORY — DX: Bipolar disorder, unspecified: F31.9

## 2019-08-27 MED ORDER — HYDROCODONE-ACETAMINOPHEN 5-325 MG PO TABS: 1.0000 | ORAL_TABLET | Freq: Four times a day (QID) | ORAL | 0 refills | Status: DC | PRN

## 2019-08-27 MED ORDER — PREDNISONE 50 MG PO TABS: 50.0000 mg | ORAL_TABLET | Freq: Every day | ORAL | 0 refills | Status: DC

## 2019-08-27 MED ORDER — PREDNISONE 20 MG PO TABS
60.0000 mg | ORAL_TABLET | Freq: Once | ORAL | Status: AC
  Administered 2019-08-27: 21:00:00 60 mg via ORAL
  Filled 2019-08-27: qty 3

## 2019-08-27 MED ORDER — HYDROCODONE-ACETAMINOPHEN 5-325 MG PO TABS
1.0000 | ORAL_TABLET | Freq: Once | ORAL | Status: AC
  Administered 2019-08-27: 1 via ORAL
  Filled 2019-08-27: qty 1

## 2019-08-27 NOTE — ED Notes (Signed)
Patient verbalizes understanding of discharge instructions. Opportunity for questioning and answers were provided. Armband removed by staff, pt discharged from ED ambulatory.   

## 2019-08-27 NOTE — ED Provider Notes (Signed)
[facility] EMERGENCY DEPARTMENT Provider Note   CSN: [id number] Arrival date & time: 08/27/19  1943     History Chief Complaint  Patient presents with   Wrist Pain    Laura Montoya is a 34 y.o. female with history significant for schizophrenia, asthma, anxiety, chronic pain disorder who presents for evaluation of right wrist pain.  Patient states she has been seen multiple times for this including neurology, rheumatology as well as her PCP.  Had nerve conduction studies test which were negative for carpal tunnel.  Was told by rheumatology this was likely pseudogout.  Patient states when she gets "flares."  She typically is prescribed prednisone.  She has been taking Tylenol without relief of her symptoms.  States she is unable to take anti-inflammatories.  States she thinks her pain is worse as she was doing a lot of driving earlier today and her wrist was held in a stiff position.  Pain worse with movement.  Denies redness, swelling, warmth.  Denies any recent injury or trauma.  Denies fever, chills, nausea, vomiting, chest pain, shortness of breath, abdominal pain, diarrhea, dysuria.  Denies additional aggravating or alleviating factors.  She took Tylenol at 12:00 which did not help.  Rates her current pain a 9/10.  History obtained from patient and past medical records.  No interpreter is used.  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     Family History  Problem Relation Age of Onset   Thyroid disease Mother    Asthma Mother    Mental illness Father    Hyperlipidemia Father    Healthy Brother    Healthy Daughter    Heart disease Paternal Grandfather    Heart disease Paternal Grandmother     Social History   Tobacco Use   Smoking status: Current Every Day Smoker    Packs/day: 2.00    Years: 21.00    Pack years: 42.00    Types: Cigarettes   Smokeless tobacco: Never Used  Substance Use Topics   Alcohol use: Yes    Alcohol/week: 0.0 standard drinks    Comment: One wine cooler per month   Drug use: Yes  Types: Marijuana    Home Medications Prior to Admission medications   Medication Sig Start Date End Date Taking? Authorizing Provider  albuterol (PROVENTIL HFA;VENTOLIN HFA) 108 (90 Base) MCG/ACT inhaler Inhale 1-2 puffs into the lungs every 6 (six) hours as needed for wheezing or shortness of breath.   Yes [provider]  Cyanocobalamin (B-12) 2500 MCG TABS Take 1 tablet by mouth 2 (two) times a week.    Yes [provider]  folic acid (FOLVITE) 034 MCG tablet Take 400 mcg every 7 (seven) days by mouth.   Yes [provider]  hydrOXYzine (ATARAX/VISTARIL) 50 MG tablet Take 50 mg by mouth 3 (three) times daily  as needed for nausea.   Yes [provider]  methocarbamol (ROBAXIN) 500 MG tablet Take 500 mg by mouth every 6 (six) hours as needed for muscle spasms.    Yes [provider]  Thiamine HCl (B-1 PO) Take 1 capsule every 7 (seven) days by mouth.   Yes [provider]  VRAYLAR 3 MG CAPS Take 3 mg by mouth daily.  05/02/16  Yes [provider]  HYDROcodone-acetaminophen (NORCO/VICODIN) 5-325 MG tablet Take 1 tablet by mouth every 6 (six) hours as needed. 08/27/19   Remmy Crass A, PA-C  ondansetron (ZOFRAN ODT) 4 MG disintegrating tablet 4mg  ODT q4 hours prn nausea/vomit Patient not taking: Reported on 08/27/2019 09/14/17   Julianne Rice, MD  predniSONE (DELTASONE) 50 MG tablet Take 1 tablet (50 mg total) by mouth daily. 08/27/19   Zita Ozimek A, PA-C    Allergies    Lavender oil  Review of Systems   Review of Systems  Constitutional: Negative.   HENT: Negative.   Respiratory: Negative.   Cardiovascular: Negative.   Gastrointestinal: Negative.   Genitourinary: Negative.   Musculoskeletal: Negative for arthralgias, back pain, gait problem, neck pain and neck stiffness.       Right wrist pain  Skin: Negative.   Neurological: Negative.   All other systems reviewed and are negative.   Physical Exam Updated Vital Signs BP 110/68 (BP Location: Left Arm)    Pulse 83    Temp 98.8 F (37.1 C) (Oral)    Resp 16    Ht 5\' 2"  (1.575 m)    Wt 65 kg    SpO2 98%    BMI 26.21 kg/m   Physical Exam Vitals and nursing note reviewed.  Constitutional:      General: She is not in acute distress.    Appearance: She is well-developed. She is not ill-appearing, toxic-appearing or diaphoretic.  HENT:     Head: Normocephalic and atraumatic.     Nose: Nose normal.     Mouth/Throat:     Mouth: Mucous membranes are moist.  Eyes:     Pupils: Pupils are equal, round, and reactive to light.  Cardiovascular:     Rate and Rhythm: Normal rate.     Pulses: Normal pulses.      Heart sounds: Normal heart sounds.  Pulmonary:     Effort: Pulmonary effort is normal. No respiratory distress.     Breath sounds: Normal breath sounds.  Abdominal:     General: Bowel sounds are normal. There is no distension.  Musculoskeletal:     Right shoulder: Normal.     Left shoulder: Normal.     Right upper arm: Normal.     Left upper arm: Normal.     Right elbow: Normal.     Left elbow: Normal.  Right forearm: Normal.     Left forearm: Normal.     Right wrist: Tenderness present. Decreased range of motion.     Left wrist: Normal.     Right hand: Normal.     Left hand: Normal.     Cervical back: Normal range of motion.     Comments: Tenderness palpation to dorsal and ventral surface of right wrist.  Is able to flex and extend however with pain.  No edema, erythema or warmth.  No tenderness over proximal or midshaft radius or ulna.  Equal grip strength bilaterally.  Skin:    General: Skin is warm and dry.     Capillary Refill: Capillary refill takes less than 2 seconds.     Comments: No edema, erythema or warmth.  No fluctuance or induration.  Neurological:     General: No focal deficit present.     Mental Status: She is alert.     Cranial Nerves: Cranial nerves are intact.     Sensory: Sensation is intact.     Motor: Motor function is intact.     Coordination: Coordination is intact.     Gait: Gait is intact.     Comments: 5/5 strength bilateral upper extremities.  Intact radial and ulnar sensation.     ED Results / Procedures / Treatments   Labs (all labs ordered are listed, but only abnormal results are displayed) Labs Reviewed - No data to display  EKG None  Radiology DG Wrist Complete Right  Result Date: 08/27/2019 CLINICAL DATA:  Atraumatic right wrist pain EXAM: RIGHT WRIST - COMPLETE 3+ VIEW COMPARISON:  None. FINDINGS: Frontal, oblique, lateral views of the right wrist demonstrate no fractures. Alignment is anatomic. Joint spaces are well  preserved. Soft tissues are normal. IMPRESSION: 1. Unremarkable right wrist. Electronically Signed   By: Sharlet Salina M.D.   On: 08/27/2019 20:18    Procedures Procedures (including critical care time)  Medications Ordered in ED Medications  HYDROcodone-acetaminophen (NORCO/VICODIN) 5-325 MG per tablet 1 tablet (has no administration in time range)  predniSONE (DELTASONE) tablet 60 mg (has no administration in time range)    ED Course  I have reviewed the triage vital signs and the nursing notes.  Pertinent labs & imaging results that were available during my care of the patient were reviewed by me and considered in my medical decision making (see chart for details).  34 year old female presents for evaluation of right wrist pain.  She is afebrile, nonseptic, non-ill-appearing.  Tenderness to dorsal ventral aspect of right wrist however no overlying skin changes.  She denies any traumatic injury.  neurovascularly intact.  Patient has been seen by neurology, rheumatology and PCP for this.  They are suspecting this is pseudogout.  She did at 1 point per chart review have positive RA factor however rheumatology did not feel her pain was likely due to rheumatoid arthritis.  She has also had nerve conduction studies which were negative for carpal tunnel.  This seems to be acute on chronic pain.  Per chart review she has had similar symptoms in the past.  This has resolved with prednisone.  She has splint at home.  She did have x-rays performed by triage which were personally reviewed which does not show evidence of fracture, dislocation or effusion.  I have low suspicion for septic joint, hemarthrosis, compartment syndrome, acute fracture, dislocation, myositis, DVT or vascular compromise.  Will DC home with short course of prednisone as this has worked for her pain in  the past.  Patient states she did have arthrocentesis which was negative for septic joint previously as well as gout crystals.  The  patient has been appropriately medically screened and/or stabilized in the ED. I have low suspicion for any other emergent medical condition which would require further screening, evaluation or treatment in the ED or require inpatient management.  Patient is hemodynamically stable and in no acute distress.  Patient able to ambulate in department prior to ED.  Evaluation does not show acute pathology that would require ongoing or additional emergent interventions while in the emergency department or further inpatient treatment.  I have discussed the diagnosis with the patient and answered all questions.  Pain is been managed while in the emergency department and patient has no further complaints prior to discharge.  Patient is comfortable with plan discussed in room and is stable for discharge at this time.  I have discussed strict return precautions for returning to the emergency department.  Patient was encouraged to follow-up with PCP/specialist refer to at discharge.   MDM Rules/Calculators/A&P                       Final Clinical Impression(s) / ED Diagnoses Final diagnoses:  Right wrist pain    Rx / DC Orders ED Discharge Orders         Ordered    predniSONE (DELTASONE) 50 MG tablet  Daily     08/27/19 2039    HYDROcodone-acetaminophen (NORCO/VICODIN) 5-325 MG tablet  Every 6 hours PRN     08/27/19 2050           Whitfield Dulay A, PA-C 08/27/19 2050    Little, Ambrose Finland, MD 08/27/19 2213

## 2019-08-27 NOTE — Discharge Instructions (Signed)
Follow-up with your primary care doctor or rheumatology.  I have prescribed you with prednisone.  Make sure to wear your wrist splint which you have at home.

## 2019-08-27 NOTE — ED Triage Notes (Signed)
Patient reports right wrist pain onset today , denies injury , patient suspects pain stems from "vibration" of steering wheel while driving this afternoon .

## 2019-10-06 ENCOUNTER — Encounter (HOSPITAL_COMMUNITY): Payer: Self-pay | Admitting: Emergency Medicine

## 2019-10-06 ENCOUNTER — Other Ambulatory Visit: Payer: Self-pay

## 2019-10-06 ENCOUNTER — Emergency Department (HOSPITAL_COMMUNITY)
Admission: EM | Admit: 2019-10-06 | Discharge: 2019-10-06 | Disposition: A | Discharge status: Home / Self Care | Payer: Medicaid Other | Attending: Emergency Medicine | Admitting: Emergency Medicine

## 2019-10-06 DIAGNOSIS — Z5321 Procedure and treatment not carried out due to patient leaving prior to being seen by health care provider: Secondary | ICD-10-CM | POA: Insufficient documentation

## 2019-10-06 DIAGNOSIS — R112 Nausea with vomiting, unspecified: Secondary | ICD-10-CM | POA: Diagnosis not present

## 2019-10-06 DIAGNOSIS — R197 Diarrhea, unspecified: Secondary | ICD-10-CM | POA: Diagnosis not present

## 2019-10-06 DIAGNOSIS — R1010 Upper abdominal pain, unspecified: Secondary | ICD-10-CM | POA: Diagnosis not present

## 2019-10-06 LAB — COMPREHENSIVE METABOLIC PANEL
ALT: 11 U/L (ref 0–44)
AST: 17 U/L (ref 15–41)
Albumin: 3.7 g/dL (ref 3.5–5.0)
Alkaline Phosphatase: 59 U/L (ref 38–126)
Anion gap: 15 (ref 5–15)
BUN: 9 mg/dL (ref 6–20)
CO2: 27 mmol/L (ref 22–32)
Calcium: 9.2 mg/dL (ref 8.9–10.3)
Chloride: 90 mmol/L — ABNORMAL LOW (ref 98–111)
Creatinine, Ser: 0.72 mg/dL (ref 0.44–1.00)
GFR calc Af Amer: >60 mL/min (ref >60)
GFR calc non Af Amer: >60 mL/min (ref >60)
Glucose, Bld: 100 mg/dL — ABNORMAL HIGH (ref 70–99)
Potassium: 2.9 mmol/L — ABNORMAL LOW (ref 3.5–5.1)
Sodium: 132 mmol/L — ABNORMAL LOW (ref 135–145)
Total Bilirubin: 0.9 mg/dL (ref 0.3–1.2)
Total Protein: 6.6 g/dL (ref 6.5–8.1)

## 2019-10-06 LAB — CBC
HCT: 42.8 % (ref 36.0–46.0)
Hemoglobin: 15.1 g/dL — ABNORMAL HIGH (ref 12.0–15.0)
MCH: 32.7 pg (ref 26.0–34.0)
MCHC: 35.3 g/dL (ref 30.0–36.0)
MCV: 92.6 fL (ref 80.0–100.0)
Platelets: 408 10*3/uL — ABNORMAL HIGH (ref 150–400)
RBC: 4.62 MIL/uL (ref 3.87–5.11)
RDW: 12.6 % (ref 11.5–15.5)
WBC: 10.4 10*3/uL (ref 4.0–10.5)
nRBC: 0 % (ref 0.0–0.2)

## 2019-10-06 LAB — LIPASE, BLOOD: Lipase: 22 U/L (ref 11–51)

## 2019-10-06 LAB — I-STAT BETA HCG BLOOD, ED (MC, WL, AP ONLY): I-stat hCG, quantitative: 8.5 m[IU]/mL — ABNORMAL HIGH (ref <5)

## 2019-10-06 MED ORDER — SODIUM CHLORIDE 0.9% FLUSH: 3.0000 mL | Freq: Once | INTRAVENOUS | Status: DC

## 2019-10-06 NOTE — ED Triage Notes (Signed)
C/o upper abd pain, nausea, vomiting, and diarrhea x 3 days.  States vomiting has now resolved.

## 2019-10-06 NOTE — ED Notes (Signed)
PT is feeling a lot better and feels that she needs to leave. Asked PT a few questions but still stated that she does not need to stay

## 2019-10-08 ENCOUNTER — Encounter (HOSPITAL_COMMUNITY): Payer: Self-pay | Admitting: Emergency Medicine

## 2019-10-08 ENCOUNTER — Emergency Department (HOSPITAL_COMMUNITY): Payer: Medicaid Other

## 2019-10-08 ENCOUNTER — Emergency Department (HOSPITAL_COMMUNITY)
Admission: EM | Admit: 2019-10-08 | Discharge: 2019-10-09 | Disposition: A | Discharge status: Home / Self Care | Payer: Medicaid Other | Attending: Emergency Medicine | Admitting: Emergency Medicine

## 2019-10-08 ENCOUNTER — Other Ambulatory Visit: Payer: Self-pay

## 2019-10-08 DIAGNOSIS — R0789 Other chest pain: Secondary | ICD-10-CM | POA: Diagnosis present

## 2019-10-08 DIAGNOSIS — R0602 Shortness of breath: Secondary | ICD-10-CM | POA: Insufficient documentation

## 2019-10-08 DIAGNOSIS — Z5321 Procedure and treatment not carried out due to patient leaving prior to being seen by health care provider: Secondary | ICD-10-CM | POA: Insufficient documentation

## 2019-10-08 LAB — CBC
HCT: 45.6 % (ref 36.0–46.0)
Hemoglobin: 16.3 g/dL — ABNORMAL HIGH (ref 12.0–15.0)
MCH: 32.3 pg (ref 26.0–34.0)
MCHC: 35.7 g/dL (ref 30.0–36.0)
MCV: 90.3 fL (ref 80.0–100.0)
Platelets: 452 10*3/uL — ABNORMAL HIGH (ref 150–400)
RBC: 5.05 MIL/uL (ref 3.87–5.11)
RDW: 12.2 % (ref 11.5–15.5)
WBC: 10.1 10*3/uL (ref 4.0–10.5)
nRBC: 0 % (ref 0.0–0.2)

## 2019-10-08 LAB — PROTIME-INR
INR: 1 (ref 0.8–1.2)
Prothrombin Time: 12.7 seconds (ref 11.4–15.2)

## 2019-10-08 MED ORDER — SODIUM CHLORIDE 0.9% FLUSH: 3.0000 mL | Freq: Once | INTRAVENOUS | Status: DC

## 2019-10-08 NOTE — ED Triage Notes (Signed)
Patient reports central chest pain with SOB and nausea onset today , no cough or fever .

## 2019-10-09 LAB — BASIC METABOLIC PANEL
Anion gap: 17 — ABNORMAL HIGH (ref 5–15)
BUN: <5 mg/dL — ABNORMAL LOW (ref 6–20)
CO2: 23 mmol/L (ref 22–32)
Calcium: 9.7 mg/dL (ref 8.9–10.3)
Chloride: 92 mmol/L — ABNORMAL LOW (ref 98–111)
Creatinine, Ser: 0.74 mg/dL (ref 0.44–1.00)
GFR calc Af Amer: >60 mL/min (ref >60)
GFR calc non Af Amer: >60 mL/min (ref >60)
Glucose, Bld: 120 mg/dL — ABNORMAL HIGH (ref 70–99)
Potassium: 2.6 mmol/L — CL (ref 3.5–5.1)
Sodium: 132 mmol/L — ABNORMAL LOW (ref 135–145)

## 2019-10-09 LAB — TROPONIN I (HIGH SENSITIVITY)
Troponin I (High Sensitivity): <2 ng/L (ref <18)
Troponin I (High Sensitivity): <2 ng/L (ref <18)

## 2019-10-09 LAB — I-STAT BETA HCG BLOOD, ED (MC, WL, AP ONLY): I-stat hCG, quantitative: <5 m[IU]/mL (ref <5)

## 2019-10-09 NOTE — ED Notes (Signed)
Pt stated they were leaving  

## 2019-10-14 ENCOUNTER — Emergency Department (HOSPITAL_COMMUNITY): Payer: Medicaid Other

## 2019-10-14 ENCOUNTER — Other Ambulatory Visit: Payer: Self-pay

## 2019-10-14 ENCOUNTER — Encounter (HOSPITAL_COMMUNITY): Payer: Self-pay

## 2019-10-14 ENCOUNTER — Emergency Department (HOSPITAL_COMMUNITY)
Admission: EM | Admit: 2019-10-14 | Discharge: 2019-10-15 | Disposition: A | Discharge status: Home / Self Care | Payer: Medicaid Other | Attending: Emergency Medicine | Admitting: Emergency Medicine

## 2019-10-14 DIAGNOSIS — Z5321 Procedure and treatment not carried out due to patient leaving prior to being seen by health care provider: Secondary | ICD-10-CM | POA: Insufficient documentation

## 2019-10-14 DIAGNOSIS — R110 Nausea: Secondary | ICD-10-CM | POA: Diagnosis present

## 2019-10-14 LAB — CBC
HCT: 41.4 % (ref 36.0–46.0)
Hemoglobin: 14.1 g/dL (ref 12.0–15.0)
MCH: 32.1 pg (ref 26.0–34.0)
MCHC: 34.1 g/dL (ref 30.0–36.0)
MCV: 94.3 fL (ref 80.0–100.0)
Platelets: 379 10*3/uL (ref 150–400)
RBC: 4.39 MIL/uL (ref 3.87–5.11)
RDW: 12.5 % (ref 11.5–15.5)
WBC: 10.8 10*3/uL — ABNORMAL HIGH (ref 4.0–10.5)
nRBC: 0 % (ref 0.0–0.2)

## 2019-10-14 LAB — BASIC METABOLIC PANEL
Anion gap: 11 (ref 5–15)
BUN: <5 mg/dL — ABNORMAL LOW (ref 6–20)
CO2: 28 mmol/L (ref 22–32)
Calcium: 9.3 mg/dL (ref 8.9–10.3)
Chloride: 99 mmol/L (ref 98–111)
Creatinine, Ser: 0.72 mg/dL (ref 0.44–1.00)
GFR calc Af Amer: >60 mL/min (ref >60)
GFR calc non Af Amer: >60 mL/min (ref >60)
Glucose, Bld: 116 mg/dL — ABNORMAL HIGH (ref 70–99)
Potassium: 3.4 mmol/L — ABNORMAL LOW (ref 3.5–5.1)
Sodium: 138 mmol/L (ref 135–145)

## 2019-10-14 LAB — I-STAT BETA HCG BLOOD, ED (MC, WL, AP ONLY): I-stat hCG, quantitative: <5 m[IU]/mL (ref <5)

## 2019-10-14 LAB — TROPONIN I (HIGH SENSITIVITY): Troponin I (High Sensitivity): <2 ng/L (ref <18)

## 2019-10-14 MED ORDER — SODIUM CHLORIDE 0.9% FLUSH: 3.0000 mL | Freq: Once | INTRAVENOUS | Status: DC

## 2019-10-14 NOTE — ED Triage Notes (Signed)
Pt arrives to ED w/ 8/10 centrally located, non-radiating, chest pain that started today. Pt endorses n/v, denies sob.

## 2019-10-14 NOTE — ED Notes (Signed)
Pt called x 3 with no answer

## 2020-05-01 ENCOUNTER — Other Ambulatory Visit: Payer: Self-pay

## 2020-05-01 ENCOUNTER — Encounter: Payer: Self-pay | Admitting: Neurology

## 2020-05-01 ENCOUNTER — Ambulatory Visit (INDEPENDENT_AMBULATORY_CARE_PROVIDER_SITE_OTHER): Payer: Medicaid Other | Admitting: Neurology

## 2020-05-01 VITALS — BP 122/83 | HR 113 | Ht 62.0 in | Wt 127.0 lb

## 2020-05-01 DIAGNOSIS — R202 Paresthesia of skin: Secondary | ICD-10-CM | POA: Diagnosis not present

## 2020-05-01 DIAGNOSIS — G63 Polyneuropathy in diseases classified elsewhere: Secondary | ICD-10-CM | POA: Diagnosis not present

## 2020-05-01 DIAGNOSIS — E5111 Dry beriberi: Secondary | ICD-10-CM | POA: Diagnosis not present

## 2020-05-01 NOTE — Patient Instructions (Signed)
Nerve testing of the arms  Try to avoid over bending at the elbow, sleep with arms extended

## 2020-05-01 NOTE — Progress Notes (Signed)
[facility] Clinic Note - Initial Visit   Date: 05/01/20  Jennetta Flood MRN: [id number] DOB: [birth year]   Dear Lonie Peak, PA-C:  Thank you for your kind referral of [name] for consultation of hand pain. Although her history is well known to you, please allow [name] to reiterate it for the purpose of our medical record. The patient was accompanied to the clinic by self.    History of Present Illness: Laura Montoya is a 35 y.o. 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 She was diagnosed with RA in December 2021 and started on plaquenil.  She continues to have numbness involving the hands, which is worse when she is holding her phone for long periods of time. No weakness or neck pain.  Symptoms are also present in the right hand, but to a lesser degree.   Out-side paper records, electronic medical record, and images have been reviewed where available and summarized as:  Lab Results  Component Value Date   HGBA1C 4.9 05/27/2015   Lab Results  Component Value Date   VITAMINB12 >1500 (H) 08/12/2015   Lab Results  Component Value Date   TSH 1.149 05/01/2015   Lab Results  Component Value Date   ESRSEDRATE 25 (H) 06/25/2015    Past Medical History:  Diagnosis Date  . Anxiety   . Asthma   . Bipolar depression (HCC)   . Dizziness   . Dysmenorrhea   . GERD (gastroesophageal reflux disease)   . Headache   . Mood disorder (HCC)   . Schizophrenia (HCC)   . Schizophrenia (HCC)   . Scoliosis   . Thrush     Past Surgical History:  Procedure Laterality Date  . BREAST SURGERY  2020  . MOUTH SURGERY  2012     Medications:  Outpatient Encounter Medications as of 05/01/2020  Medication Sig  . albuterol (PROVENTIL HFA;VENTOLIN HFA) 108 (90 Base) MCG/ACT inhaler Inhale 1-2 puffs into the lungs every 6 (six) hours  as needed for wheezing or shortness of breath.  . ARIPiprazole (ABILIFY) 2 MG tablet Take 2 mg by mouth daily. Patient unsure of dose  . busPIRone (BUSPAR) 7.5 MG tablet Take by mouth.  . Cyanocobalamin (B-12) 2500 MCG TABS Take 1 tablet by mouth 2 (two) times a week.   . folic acid (FOLVITE) 400 MCG tablet Take 400 mcg every 7 (seven) days by mouth.  . hydroxychloroquine (PLAQUENIL) 200 MG tablet Take by mouth daily.  . hydrOXYzine (ATARAX/VISTARIL) 50 MG tablet Take 50 mg by mouth 3 (three) times daily as needed for nausea.  . methocarbamol (ROBAXIN) 500 MG tablet Take 500 mg by mouth every 6 (six) hours as needed for muscle spasms.   . Thiamine HCl (B-1 PO) Take 1 capsule every 7 (seven) days by mouth.  Marland Kitchen HYDROcodone-acetaminophen (NORCO/VICODIN) 5-325 MG tablet Take 1 tablet by mouth every 6 (six) hours as needed. (Patient not taking: Reported on 05/01/2020)  . ondansetron (ZOFRAN ODT) 4 MG disintegrating tablet 4mg  ODT q4 hours prn nausea/vomit (Patient not taking: No sig reported)  . predniSONE (DELTASONE) 50 MG tablet Take 1 tablet (50 mg total) by mouth daily. (Patient not taking: Reported on 05/01/2020)  . VRAYLAR 3 MG CAPS Take 3 mg by mouth daily.  (Patient not taking: Reported on 05/01/2020)   No facility-administered encounter medications on file as of 05/01/2020.    Allergies:  Allergies  Allergen Reactions  . Lavender Oil Rash    Family History: Family History  Problem Relation Age of Onset  . Thyroid disease Mother   . Asthma Mother   . Mental illness Father   . Hyperlipidemia Father   . Healthy Brother   . Healthy Daughter   . Heart disease Paternal Grandfather   . Heart disease Paternal Grandmother     Social History: Social History   Tobacco Use  . Smoking status: Current Every Day Smoker    Packs/day: 2.00    Years: 21.00    Pack years: 42.00    Types: Cigarettes  . Smokeless tobacco: Never Used  Vaping Use  . Vaping Use: Some days  . Devices: uses cbd   Substance Use Topics  . Alcohol use: Yes    Alcohol/week: 0.0 standard drinks    Comment: One wine cooler per month  . Drug use: Yes    Types: Marijuana   Social History   Social History Narrative   Has not had caffeine for 1 month 05/27/15.  Lives with parents and daughter in a one story home.  Does not work.  Has a disability hearing coming up on July 16, 2015.     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     Education: GED      right handed   Lives in one story home    Lives with family    Vital Signs:  BP 122/83   Pulse (!) 113   Ht 5\' 2"  (1.575 m)   Wt 127 lb (57.6 kg)   LMP 05/04/2018   SpO2 99%   BMI 23.23 kg/m   Neurological Exam: MENTAL STATUS including orientation to time, place, person, recent and remote memory, attention span and concentration, language, and fund of knowledge is normal.  Speech is not dysarthric.  CRANIAL NERVES: II:  No visual field defects.     III-IV-VI: Pupils equal round.  Normal conjugate, extra-ocular eye movements in all directions of gaze.  No nystagmus.  No ptosis.    MOTOR:  No atrophy, fasciculations or abnormal movements.  No pronator drift.   Upper Extremity:  Right  Left  Deltoid  5/5   5/5   Biceps  5/5   5/5   Triceps  5/5   5/5   Infraspinatus 5/5  5/5  Medial pectoralis 5/5  5/5  Wrist extensors  5/5   5/5   Wrist flexors  5/5   5/5   Finger extensors  5/5   5/5   Finger flexors  5/5   5/5   Dorsal interossei  5/5   5/5   Abductor pollicis  5/5   5/5   Tone (Ashworth scale)  0  0   Lower Extremity:  Right  Left  Hip flexors  5/5   5/5   Hip extensors  5/5   5/5   Adductor 5/5  5/5  Abductor 5/5  5/5  Knee flexors  5/5   5/5   Knee extensors  5/5   5/5   Dorsiflexors  5/5   5/5   Plantarflexors  5/5   5/5   Toe extensors  5/5   5/5   Toe flexors  5/5   5/5   Tone (Ashworth scale)  0  0   MSRs:  Right        Left  brachioradialis 2+  2+  biceps 2+  2+  triceps 2+  2+   patellar tr  tr  ankle jerk 0  0  Hoffman no  no  plantar response down  down   SENSORY:  Vibration, temperature and pin prick reduced distal to knees bilaterally  Sensation intact to all modalities in the hands.   COORDINATION/GAIT: Normal finger-to- nose-finger.  Gait narrow based and stable. Unassisted   IMPRESSION: 1.  Bilateral paresthesias, ?ulnar neuropathy  - Prior NCS/EMG of the arms has been normal  - Repeat NCS/EMG L > RUE  - Strategies to avoid nerve compression and stretching at the elbow discussed  2.  Nutritional neuropathy, she is compliant with B12, folic acid, and B1 supplements.  Exam shows residual sensory deficits below the knee.  3.  Generalized itching does not seem to be primary neurological in nature.  Follow-up with PCP  Further recommendations pending results.    Thank you for allowing me to participate in patient's care.  If I can answer any additional questions, I would be pleased to do so.    Sincerely,    Dalaya Suppa K. Allena Katz, DO

## 2020-06-10 ENCOUNTER — Encounter: Payer: Medicaid Other | Admitting: Neurology

## 2020-07-22 ENCOUNTER — Other Ambulatory Visit: Payer: Self-pay

## 2020-07-22 ENCOUNTER — Ambulatory Visit (INDEPENDENT_AMBULATORY_CARE_PROVIDER_SITE_OTHER): Payer: Medicaid Other | Admitting: Neurology

## 2020-07-22 ENCOUNTER — Telehealth: Payer: Self-pay

## 2020-07-22 DIAGNOSIS — R202 Paresthesia of skin: Secondary | ICD-10-CM | POA: Diagnosis not present

## 2020-07-22 DIAGNOSIS — E5111 Dry beriberi: Secondary | ICD-10-CM

## 2020-07-22 DIAGNOSIS — G63 Polyneuropathy in diseases classified elsewhere: Secondary | ICD-10-CM

## 2020-07-22 NOTE — Procedures (Signed)
[facility]  [address] [place], [address]  [place], [place] [address] Tel: [phone] Fax:  [phone] Test Date:  07/22/2020  Patient: Laura Montoya DOB: [birth year] Physician: Nita Sickle, DO  Sex: Female Height: 5\' 2"  Ref Phys: , DO  ID#: Nita Sickle   Technician:    Patient Complaints: This is a 35 year old female with history of vitamin deficiency neuropathy referred for evaluation of bilateral hand paresthesias.  NCV & EMG Findings: Extensive electrodiagnostic testing of the right upper extremity and additional studies of the left shows:  1. All sensory responses show reduced amplitude including bilateral median (R6.9, L7.6 V), bilateral ulnar (R9.0, 4.2 V), and bilateral radial sensory responses (R7.8, L7.8 V).  Additionally, the left median sensory response shows prolonged latency (3.6 ms).  As compared to prior study on 07/07/2015, sensory amplitudes have all improved. 2. Bilateral median and ulnar motor responses are within normal limits. Of note, there is evidence of a median-to-ulnar crossover in the forearm as seen by a motor response stimulating at the ulnar-wrist and recording at the abductor pollicis brevis muscle. 3. Chronic motor axonal loss changes are seen affecting the distal hand muscles, without accompanied active denervation.  Impression: 1. 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  Sensory responses show improvement from prior study on 07/07/2015. 2. A superimposed left carpal tunnel syndrome, cannot be excluded.  Correlate clinically.   ___________________________ 07/09/2015, DO    Nerve Conduction Studies Anti Sensory Summary Table   Stim Site NR Peak (ms) Norm Peak (ms) P-T Amp (V) Norm P-T Amp  Left Median Anti Sensory (2nd Digit)  32C  Wrist    3.6 <3.4 7.6 >20  Right Median Anti Sensory (2nd Digit)  32C  Wrist    3.3 <3.4 6.9 >20  Left Radial  Anti Sensory (Base 1st Digit)  32C  Wrist    2.4 <2.7 7.8 >18  Right Radial Anti Sensory (Base 1st Digit)  32C  Wrist    2.3 <2.7 7.8 >18  Left Ulnar Anti Sensory (5th Digit)  32C  Wrist    2.6 <3.1 4.2 >12  Right Ulnar Anti Sensory (5th Digit)  32C  Wrist    2.8 <3.1 9.0 >12   Motor Summary Table   Stim Site NR Onset (ms) Norm Onset (ms) O-P Amp (mV) Norm O-P Amp Site1 Site2 Delta-0 (ms) Dist (cm) Vel (m/s) Norm Vel (m/s)  Left Median Motor (Abd Poll Brev)  32C  Wrist    3.1 <3.9 5.6 >6 Elbow Wrist 3.9 25.0 64 >50  Elbow    7.0  5.5  Ulnar-wrist crossover Elbow 2.6 0.0    Ulnar-wrist crossover    4.4  4.6         Right Median Motor (Abd Poll Brev)  32C  Wrist    3.5 <3.9 5.2 >6 Elbow Wrist 4.7 26.0 55 >50  Elbow    8.2  6.1  Ulnar-wrist crossover Elbow 4.1 0.0    Ulnar-wrist crossover    4.1  5.4         Left Ulnar Motor (Abd Dig Minimi)  32C  Wrist    2.0 <3.1 10.7 >7 B Elbow Wrist 3.0 19.0 63 >50  B Elbow    5.0  9.7  A Elbow B Elbow 1.8 10.0 56 >50  A Elbow    6.8  9.3         Right Ulnar Motor (Abd Dig Minimi)  32C  Wrist    2.3 <3.1 8.9 >7 B Elbow Wrist 3.2 21.0 66 >50  B Elbow    5.5  7.4  A Elbow B Elbow 1.7 10.0 59 >50  A Elbow    7.2  6.9          EMG   Side Muscle Ins Act Fibs Psw Fasc Number Recrt Dur Dur. Amp Amp. Poly Poly. Comment  Left 1stDorInt Nml Nml Nml Nml Nml Nml Nml Nml Nml Nml Nml Nml N/A  Left Abd Poll Brev Nml Nml Nml Nml 1- Rapid Some 1+ Some 1+ Some 1+ N/A  Left Ext Indicis Nml Nml Nml Nml 1- Rapid Few 1+ Few 1+ Few 1+ N/A  Left PronatorTeres Nml Nml Nml Nml Nml Nml Nml Nml Nml Nml Nml Nml N/A  Left Biceps Nml Nml Nml Nml Nml Nml Nml Nml Nml Nml Nml Nml N/A  Left Triceps Nml Nml Nml Nml Nml Nml Nml Nml Nml Nml Nml Nml N/A  Left Deltoid Nml Nml Nml Nml Nml Nml Nml Nml Nml Nml Nml Nml N/A  Left ABD Dig Min Nml Nml Nml Nml 1- Rapid Some 1+ Some 1+ Some 1+ N/A  Right 1stDorInt Nml Nml Nml Nml Nml Nml Nml Nml Nml Nml Nml Nml N/A  Right Abd Poll  Brev Nml Nml Nml Nml 1- Rapid Some 1+ Some 1+ Some Nml N/A  Right Ext Indicis Nml Nml Nml Nml Nml Nml Nml Nml Nml Nml Nml Nml N/A  Right PronatorTeres Nml Nml Nml Nml Nml Nml Nml Nml Nml Nml Nml Nml N/A  Right ABD Dig Min Nml Nml Nml Nml 1- Rapid Some 1+ Some 1+ Some 1+ N/A      Waveforms:

## 2020-07-22 NOTE — Telephone Encounter (Signed)
Patient returned call and was informed of results and recommendations. Patient was transferred up front to scheduled her 3 month f/u

## 2020-07-22 NOTE — Telephone Encounter (Signed)
Called patient and left a message for a call back.  

## 2020-07-22 NOTE — Telephone Encounter (Signed)
-----   Message from Donika K Patel, DO sent at 07/22/2020  3:12 PM EDT ----- Please let pt know that her nerve testing continues to show neuropathy in the hands, but it has improved compared to last study.  She may also have an overlapping left carpal tunnel syndrome.  I recommend she start wearing wrist splints at night time.  Follow-up in 3 months.  

## 2020-07-22 NOTE — Telephone Encounter (Signed)
-----   Message from Glendale Chard, DO sent at 07/22/2020  3:12 PM EDT ----- Please let pt know that her nerve testing continues to show neuropathy in the hands, but it has improved compared to last study.  She may also have an overlapping left carpal tunnel syndrome.  I recommend she start wearing wrist splints at night time.  Follow-up in 3 months.

## 2021-01-22 ENCOUNTER — Other Ambulatory Visit: Payer: Self-pay

## 2021-01-22 ENCOUNTER — Telehealth (INDEPENDENT_AMBULATORY_CARE_PROVIDER_SITE_OTHER): Payer: Medicaid Other | Admitting: Neurology

## 2021-01-22 VITALS — Wt 140.0 lb

## 2021-01-22 DIAGNOSIS — G5602 Carpal tunnel syndrome, left upper limb: Secondary | ICD-10-CM | POA: Diagnosis not present

## 2021-01-22 DIAGNOSIS — G63 Polyneuropathy in diseases classified elsewhere: Secondary | ICD-10-CM

## 2021-01-22 DIAGNOSIS — G5712 Meralgia paresthetica, left lower limb: Secondary | ICD-10-CM | POA: Diagnosis not present

## 2021-01-22 DIAGNOSIS — R202 Paresthesia of skin: Secondary | ICD-10-CM

## 2021-01-22 MED ORDER — PREGABALIN 50 MG PO CAPS: 50.0000 mg | ORAL_CAPSULE | Freq: Two times a day (BID) | ORAL | 2 refills | Status: DC

## 2021-01-22 NOTE — Progress Notes (Signed)
   Virtual Visit via Video Note The purpose of this virtual visit is to provide medical care while limiting exposure to the novel coronavirus.    Consent was obtained for video visit:  Yes.   Answered questions that patient had about telehealth interaction:  Yes.   I discussed the limitations, risks, security and privacy concerns of performing an evaluation and management service by telemedicine. I also discussed with the patient that there may be a patient responsible charge related to this service. The patient expressed understanding and agreed to proceed.  Pt location: Home Physician Location: office Name of referring provider:  Lonie Peak, PA-C I connected with [name] at patients initiation/request on 01/22/2021 at 10:50 AM EDT by video enabled telemedicine application and verified that I am speaking with the correct person using two identifiers. Pt MRN:  [id number] Pt DOB:  [birth year] Video Participants:  [name]   History of Present Illness: This is a 35 y.o. female  with right handed female with nutritional deficiency neuropathy, bipolar disorder, tobacco use, RA, and asthma returning for follow-up of bilateral hand paresthesias and left thigh pain.  She had NCS/EMG of the hands which shows improved neuropathy in the hands but with probable overlapping left carpal tunnel syndrome.  She has been using wrist brace which helps some, but over the past 3 days, her left hand has been much more bothersome.    She also complains of left thigh burning.  It involves the side of the leg, does not move past the knee.  She has gained 10lb. She denies leg weakness.  She is doing PT for her low back, but this has not helped.   Observations/Objective:   Vitals:   01/22/21 1004  Weight: 140 lb (63.5 kg)   Patient is awake, alert, and appears comfortable.  Oriented x 4.   Extraocular muscles are intact. No ptosis.  Face is symmetric.  Speech is not dysarthric,  edentulous. Antigravity in all extremities.  No pronator drift. Gait appears normal, unassisted.  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  Sensory responses show improvement from prior study on 07/07/2015. A superimposed left carpal tunnel syndrome, cannot be excluded.  Correlate clinically.  Assessment and Plan:  Bilateral hand paresthesias due to neuropathy and overlapping left carpal tunnel syndrome.  She has mild relief with using a wrist brace on the left hand and would like other treatment options.  I will refer her to orthopaedics for consideration of steroid injection.  2.  Nutritional neuropathy, she is compliant with B12, folic acid, and B1 supplements.  Exam shows residual sensory deficits below the knee.  3.  Left meralgia paresthetica.  She has gained 10lb. I will offer a trial of Lyrica 50mg  BID.  She did not wish to be on gabapentin due to history of weight gain in the medication.  She is already doing PT for back strengthening.     Follow Up Instructions:   I discussed the assessment and treatment plan with the patient. The patient was provided an opportunity to ask questions and all were answered. The patient agreed with the plan and demonstrated an understanding of the instructions.   The patient was advised to call back or seek an in-person evaluation if the symptoms worsen or if the condition fails to improve as anticipated.  Follow-up in 6 months    , DO

## 2021-01-26 ENCOUNTER — Ambulatory Visit: Payer: Medicaid Other | Admitting: Orthopedic Surgery

## 2021-01-26 ENCOUNTER — Encounter: Payer: Self-pay | Admitting: Orthopedic Surgery

## 2021-01-26 ENCOUNTER — Other Ambulatory Visit: Payer: Self-pay

## 2021-01-26 DIAGNOSIS — R200 Anesthesia of skin: Secondary | ICD-10-CM

## 2021-01-26 NOTE — Progress Notes (Signed)
Office Visit Note   Patient: Laura Montoya           Date of Birth: 1986-01-02           MRN: [id number] Visit Date: 01/26/2021              Requested by: Glendale Chard, DO [address] [place],  [place] [address] PCP: Lonie Peak, PA-C   Assessment & Plan: Visit Diagnoses:  1. Bilateral hand numbness     Plan: Discussed with patient that it is difficult to tell if she truly has carpal tunnel syndrome on the left versus numbness and paresthesias secondary to her vitamin deficiency neuropathy.  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  The study could not exclude a superimposed L CTS.  She is being followed by neurology for this BUE and BLE neuropathy.  They have started her on lyrica and she is on vitamin supplementation.  Her provocative tests are equivocal and not clearly suggestive of CTS.  We discussed nonsurgical treatment options including bracing and injections.  She wants to continue bracing and see if the lyrica help.    Follow-Up Instructions: No follow-ups on file.   Orders:  No orders of the defined types were placed in this encounter.  No orders of the defined types were placed in this encounter.     Procedures: No procedures performed   Clinical Data: No additional findings.   Subjective: Chief Complaint  Patient presents with   Left Hand - Numbness, Weakness    This is a 35 yo RHD F who presents w/ numbness and paresthesias involving both upper extremities.  This has been going on for 6-8 months.  She describes symptoms involving all of her fingers which is occasionally numbness and paresthesias but also burning type pain.  She describes similar symptoms in both of her feet and her left thigh.  She has been diagnosed with a vitamin deficiency neuropathy for which she sees neurology and is on vitamin supplementation.  She has other pain related disorders including  chronic pain disorder, cervalgia, and myofasical muscle pain syndrome.  She has worn braces on the L wrist for 3-4 months without much symptom relief. She also reports being diagnosed with rheumatoid arthritis for which she takes plaquenil.   Weakness Associated symptoms include weakness.   Review of Systems  Neurological:  Positive for weakness.    Objective: Vital Signs: BP 107/65 (BP Location: Left Arm, Patient Position: Sitting)   Pulse 77   Ht 5\' 2"  (1.575 m)   Wt 127 lb (57.6 kg)   LMP 05/04/2018   BMI 23.23 kg/m   Physical Exam Constitutional:      Appearance: Normal appearance.  Cardiovascular:     Rate and Rhythm: Normal rate.     Pulses: Normal pulses.  Pulmonary:     Effort: Pulmonary effort is normal.  Skin:    General: Skin is warm and dry.     Capillary Refill: Capillary refill takes less than 2 seconds.  Neurological:     Mental Status: She is alert.    Right Hand Exam   Comments:  Negative Tinel and carpal tunnel compression test. 5/5 thenar motor strength w/ no atrophy.  5/5 FDI and intrinsic strength.     Left Hand Exam   Tenderness  The patient is experiencing no tenderness.   Range of Motion  The patient has normal left wrist ROM.  Muscle Strength  The patient has normal left wrist strength.  Other  Erythema: absent Sensation: normal Pulse: present  Comments:  Pain in thumb with Tinel's at wrist.  Numbness in all fingers with carpal tunnel compression test.  5/5 thenar motor strength w/ no atrophy.  5/5 FDI and intrinsic strength.       Specialty Comments:  No specialty comments available.  Imaging: No results found.   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   Vitamin B12 deficiency 08/12/2015   Neuropathy due to medical condition (HCC) 08/12/2015   Folate deficiency 08/12/2015   Schizophrenia (HCC) 04/30/2015   Benign paroxysmal positional vertigo 03/04/2015   Cervicalgia 05/06/2013   Myofascial muscle pain 02/06/2013   Sacroiliitis (HCC) 07/09/2012   Idiopathic scoliosis 06/12/2012   Lumbar spondylosis with myelopathy 06/12/2012   Osteopenia 06/12/2012   Past Medical History:  Diagnosis Date   Anxiety    Asthma    Bipolar depression (HCC)    Dizziness    Dysmenorrhea    GERD (gastroesophageal reflux disease)    Headache    Mood disorder (HCC)    Schizophrenia (HCC)    Schizophrenia (HCC)    Scoliosis    Thrush     Family History  Problem Relation Age of Onset   Thyroid disease Mother    Asthma Mother    Mental illness Father    Hyperlipidemia Father    Healthy Brother    Healthy Daughter    Heart disease Paternal Grandfather    Heart disease Paternal Grandmother     Past Surgical History:  Procedure Laterality Date   BREAST SURGERY  2020   MOUTH SURGERY  2012   Social History   Occupational History   Occupation: N/A  Tobacco Use   Smoking status: Every Day    Packs/day: 2.00    Years: 21.00    Pack years: 42.00    Types: Cigarettes   Smokeless tobacco: Never  Vaping Use   Vaping Use: Some days   Devices: uses cbd  Substance and Sexual Activity   Alcohol use: Yes    Alcohol/week: 0.0 standard drinks    Comment: One wine cooler per month   Drug use: Yes    Types: Marijuana   Sexual activity: Not on file

## 2021-04-05 ENCOUNTER — Telehealth: Payer: Self-pay | Admitting: Neurology

## 2021-04-05 NOTE — Telephone Encounter (Signed)
Patient called, she is having issues with her neuropathy. It has started to develop in her left upper thigh. She would like to be seen or would like to know what can be done.

## 2021-04-05 NOTE — Telephone Encounter (Signed)
Called patient and informed her that Dr. Allena Katz had discussed her symptoms with her at her visit and started her on Lyrica 50 mg BID. Informed patient that Dr. Allena Katz stated that we can try her on Lyrica 100 mg BID and see If that will help her. Patient is agreeable to increasing her Lyrica. Informed patient that we will get that sent to her Pharmacy. Patient verbalized understanding.

## 2021-04-05 NOTE — Telephone Encounter (Signed)
I discussed these symptoms at her last visit and started Lyrica 50mg  BID. We can increase Lyrica to 100mg  BID, if she would like to try this.

## 2021-04-06 MED ORDER — PREGABALIN 100 MG PO CAPS: 100.0000 mg | ORAL_CAPSULE | Freq: Two times a day (BID) | ORAL | 5 refills | Status: DC

## 2021-04-06 NOTE — Telephone Encounter (Signed)
Rx for Lyrica 100mg  BID sent.

## 2021-06-22 ENCOUNTER — Telehealth: Payer: Self-pay | Admitting: Neurology

## 2021-06-22 NOTE — Telephone Encounter (Signed)
Either we can reduce her Lyrica down to 50mg  BID or switch her to another medication, such as Cymbalta.

## 2021-06-22 NOTE — Telephone Encounter (Signed)
Pt called in about her Lyrica. She says she has gained a lot of weight with it. She would like to see if there may be something she can do?

## 2021-06-23 NOTE — Telephone Encounter (Signed)
Called patient and informed her that we can reduce her Lyrica down to 50mg  BID or switch her to another medication, such as Cymbalta.  Patient stated that she has already tried the Lyrica 50 mg BID. Patient is ok with trying Cymbalta. Patient is aware that I will send Dr. this message to notify her and her medication will be sent.  ?

## 2021-06-24 MED ORDER — DULOXETINE HCL 30 MG PO CPEP: 30.0000 mg | ORAL_CAPSULE | Freq: Every day | ORAL | 5 refills | Status: DC

## 2021-06-24 NOTE — Telephone Encounter (Signed)
Rx for Cymbalta 30mg  daily sent. ?

## 2021-08-18 ENCOUNTER — Emergency Department (HOSPITAL_COMMUNITY)
Admission: EM | Admit: 2021-08-18 | Discharge: 2021-08-18 | Disposition: A | Discharge status: Home / Self Care | Payer: Medicaid Other | Attending: Emergency Medicine | Admitting: Emergency Medicine

## 2021-08-18 ENCOUNTER — Emergency Department (HOSPITAL_COMMUNITY): Payer: Medicaid Other

## 2021-08-18 ENCOUNTER — Encounter (HOSPITAL_COMMUNITY): Payer: Self-pay

## 2021-08-18 ENCOUNTER — Other Ambulatory Visit: Payer: Self-pay

## 2021-08-18 DIAGNOSIS — E876 Hypokalemia: Secondary | ICD-10-CM | POA: Diagnosis not present

## 2021-08-18 DIAGNOSIS — R000 Tachycardia, unspecified: Secondary | ICD-10-CM | POA: Insufficient documentation

## 2021-08-18 DIAGNOSIS — R0602 Shortness of breath: Secondary | ICD-10-CM | POA: Insufficient documentation

## 2021-08-18 DIAGNOSIS — M546 Pain in thoracic spine: Secondary | ICD-10-CM | POA: Insufficient documentation

## 2021-08-18 DIAGNOSIS — K449 Diaphragmatic hernia without obstruction or gangrene: Secondary | ICD-10-CM

## 2021-08-18 DIAGNOSIS — R1013 Epigastric pain: Secondary | ICD-10-CM | POA: Insufficient documentation

## 2021-08-18 DIAGNOSIS — R1033 Periumbilical pain: Secondary | ICD-10-CM | POA: Diagnosis present

## 2021-08-18 DIAGNOSIS — D72829 Elevated white blood cell count, unspecified: Secondary | ICD-10-CM | POA: Diagnosis not present

## 2021-08-18 DIAGNOSIS — J45909 Unspecified asthma, uncomplicated: Secondary | ICD-10-CM | POA: Diagnosis not present

## 2021-08-18 DIAGNOSIS — R112 Nausea with vomiting, unspecified: Secondary | ICD-10-CM | POA: Insufficient documentation

## 2021-08-18 LAB — CBC
HCT: 43 % (ref 36.0–46.0)
Hemoglobin: 15.9 g/dL — ABNORMAL HIGH (ref 12.0–15.0)
MCH: 34.9 pg — ABNORMAL HIGH (ref 26.0–34.0)
MCHC: 37 g/dL — ABNORMAL HIGH (ref 30.0–36.0)
MCV: 94.3 fL (ref 80.0–100.0)
Platelets: 524 10*3/uL — ABNORMAL HIGH (ref 150–400)
RBC: 4.56 MIL/uL (ref 3.87–5.11)
RDW: 12.9 % (ref 11.5–15.5)
WBC: 12.2 10*3/uL — ABNORMAL HIGH (ref 4.0–10.5)
nRBC: 0 % (ref 0.0–0.2)

## 2021-08-18 LAB — URINALYSIS, ROUTINE W REFLEX MICROSCOPIC
Bilirubin Urine: NEGATIVE
Glucose, UA: NEGATIVE mg/dL
Ketones, ur: 5 mg/dL — AB
Leukocytes,Ua: NEGATIVE
Nitrite: NEGATIVE
Protein, ur: NEGATIVE mg/dL
Specific Gravity, Urine: 1.008 (ref 1.005–1.030)
pH: 7 (ref 5.0–8.0)

## 2021-08-18 LAB — COMPREHENSIVE METABOLIC PANEL
ALT: 10 U/L (ref 0–44)
AST: 16 U/L (ref 15–41)
Albumin: 4.1 g/dL (ref 3.5–5.0)
Alkaline Phosphatase: 70 U/L (ref 38–126)
Anion gap: 16 — ABNORMAL HIGH (ref 5–15)
BUN: <5 mg/dL — ABNORMAL LOW (ref 6–20)
CO2: 26 mmol/L (ref 22–32)
Calcium: 9.6 mg/dL (ref 8.9–10.3)
Chloride: 90 mmol/L — ABNORMAL LOW (ref 98–111)
Creatinine, Ser: 0.71 mg/dL (ref 0.44–1.00)
GFR, Estimated: >60 mL/min (ref >60)
Glucose, Bld: 136 mg/dL — ABNORMAL HIGH (ref 70–99)
Potassium: 2.4 mmol/L — CL (ref 3.5–5.1)
Sodium: 132 mmol/L — ABNORMAL LOW (ref 135–145)
Total Bilirubin: 0.8 mg/dL (ref 0.3–1.2)
Total Protein: 7.1 g/dL (ref 6.5–8.1)

## 2021-08-18 LAB — TROPONIN I (HIGH SENSITIVITY)
Troponin I (High Sensitivity): 3 ng/L (ref <18)
Troponin I (High Sensitivity): 4 ng/L (ref <18)

## 2021-08-18 LAB — I-STAT BETA HCG BLOOD, ED (MC, WL, AP ONLY): I-stat hCG, quantitative: <5 m[IU]/mL (ref <5)

## 2021-08-18 LAB — D-DIMER, QUANTITATIVE: D-Dimer, Quant: 2.3 ug/mL-FEU — ABNORMAL HIGH (ref 0.00–0.50)

## 2021-08-18 LAB — LIPASE, BLOOD: Lipase: 24 U/L (ref 11–51)

## 2021-08-18 LAB — MAGNESIUM: Magnesium: 2 mg/dL (ref 1.7–2.4)

## 2021-08-18 MED ORDER — IOHEXOL 350 MG/ML SOLN
75.0000 mL | Freq: Once | INTRAVENOUS | Status: AC | PRN
  Administered 2021-08-18: 75 mL via INTRAVENOUS

## 2021-08-18 MED ORDER — ONDANSETRON 8 MG PO TBDP: 8.0000 mg | ORAL_TABLET | Freq: Three times a day (TID) | ORAL | 0 refills | Status: DC | PRN

## 2021-08-18 MED ORDER — PANTOPRAZOLE SODIUM 40 MG PO TBEC: 40.0000 mg | DELAYED_RELEASE_TABLET | Freq: Every day | ORAL | 0 refills | Status: DC

## 2021-08-18 MED ORDER — MORPHINE SULFATE (PF) 4 MG/ML IV SOLN
4.0000 mg | Freq: Once | INTRAVENOUS | Status: AC
  Administered 2021-08-18: 4 mg via INTRAVENOUS
  Filled 2021-08-18: qty 1

## 2021-08-18 MED ORDER — POTASSIUM CHLORIDE CRYS ER 20 MEQ PO TBCR: 20.0000 meq | EXTENDED_RELEASE_TABLET | Freq: Two times a day (BID) | ORAL | 0 refills | Status: DC

## 2021-08-18 MED ORDER — LACTATED RINGERS IV BOLUS
1000.0000 mL | Freq: Once | INTRAVENOUS | Status: AC
  Administered 2021-08-18: 1000 mL via INTRAVENOUS

## 2021-08-18 MED ORDER — POTASSIUM CHLORIDE 10 MEQ/100ML IV SOLN
10.0000 meq | INTRAVENOUS | Status: AC
  Administered 2021-08-18 (×2): 10 meq via INTRAVENOUS
  Filled 2021-08-18 (×3): qty 100

## 2021-08-18 MED ORDER — METOCLOPRAMIDE HCL 5 MG/ML IJ SOLN
10.0000 mg | Freq: Once | INTRAMUSCULAR | Status: AC
  Administered 2021-08-18: 10 mg via INTRAVENOUS
  Filled 2021-08-18: qty 2

## 2021-08-18 NOTE — ED Provider Triage Note (Signed)
Emergency Medicine Provider Triage Evaluation Note ? ?Laura Montoya , a 36 y.o. female  was evaluated in triage.  Pt complains of epigastric abdominal pain, left-sided chest pain, and chronic back pain. ? ?Patient reports that epigastric abdominal pain started a week ago.  Pain has been intermittent since then.  Pain does not radiate.  Pain is worse with touch.  Patient endorses associated nausea. ? ?Patient reports that chest pain has been intermittent over the last 2 to 3 days.  Pain is located to the left side of her chest.  Pain comes on randomly.  Describes pain as a heaviness.  Patient endorses associated nausea and shortness of breath.  No aggravation with exertion.  Last episode of chest pain occurred upon arriving in the emergency department. ? ?Patient denies any fever, chills, vomiting, blood in stool, melena, constipation, diarrhea, dysuria, hematuria, urinary urgency, vaginal pain, vaginal discharge, numbness, weakness, lightheadedness, syncope, palpitations, leg swelling or tenderness. ? ?Denies any frequent NSAID use.  Denies any illicit drug or alcohol use. ? ?Review of Systems  ?Positive: Epigastric abdominal pain, left-sided chest pain, chronic back pain ?Negative: See above ? ?Physical Exam  ?BP (!) 127/106 (BP Location: Right Arm)   Pulse (!) 119   Temp 98.7 ?F (37.1 ?C) (Oral)   Resp 20   Ht 5\' 2"  (1.575 m)   Wt 65.8 kg   LMP 05/04/2018   SpO2 100%   BMI 26.52 kg/m?  ?Gen:   Awake, no distress   ?Resp:  Normal effort, clear to auscultation bilaterally ?MSK:   Moves extremities without difficulty  ?Other:  +2 radial pulse bilaterally. ? ?Abdomen soft, nondistended, tenderness to epigastric area.  No guarding or rebound tenderness ? ?Medical Decision Making  ?Medically screening exam initiated at 12:44 PM.  Appropriate orders placed.  07/03/2018 was informed that the remainder of the evaluation will be completed by another provider, this initial triage assessment does not  replace that evaluation, and the importance of remaining in the ED until their evaluation is complete. ? ?ACS work-up initiated.  Abdominal labs ordered. ?  ?Laura Gully, PA-C ?08/18/21 1246 ? ?

## 2021-08-18 NOTE — ED Notes (Signed)
Walked patient to the bathroom patient did well patient is now back in bed on the monitor with family at bedside and call bell in reach  ?

## 2021-08-18 NOTE — ED Notes (Signed)
Patient transported to CT 

## 2021-08-18 NOTE — Discharge Instructions (Addendum)
Take the medications as prescribed to help with your nausea possible GERD and hypokalemia.  Follow-up with your primary care doctor within the next few days to have your potassium level rechecked.  Return as needed for worsening symptoms ?

## 2021-08-18 NOTE — ED Triage Notes (Signed)
Reports back pain and nausea vomiting x 1 week reports urine is dark but no other symptoms.  Also reports abdominal pain.  ?

## 2021-08-18 NOTE — ED Provider Notes (Signed)
?[facility] ?Provider Note ? ? ?CSN: [id number] ?Arrival date & time: 08/18/21  1201 ? ?  ? ?History ? ?Chief Complaint  ?Patient presents with  ? Back Pain  ? ? ?Laura Montoya is a 36 y.o. female. ? ?Patient is a 36 year old female with a history of schizophrenia, asthma, cyclical vomiting, GERD who is presenting today due to complaint of back pain, chest pain, abdominal pain and vomiting.  Patient reports this started approximately 8 days ago when she was sitting on the couch watching TV with her mom when she had severe pain in the middle of her back and had an episode of vomiting.  She reports since Tuesday she has had ongoing continual nausea and intermittent bouts of vomiting.  She reports she has been able to hold very little down and has not been eating.  She tried Phenergan at home which she has a prescription for but its not been helping.  She has some mild shortness of breath but denies significant cough.  She has had no fever but does complain of mostly centralized abdominal pain.  She cannot remember when her last bowel movement was and reports that she has not urinated because it just feels like there is nothing there to come out but when it does she is not having any dysuria, frequency or urgency.  She does smoke cigarettes and CBD that she can buy at the store.  She denies any fever.  No prior history of PE or DVT.  Patient reports in general she feels terrible and is still nauseated.  She has had no prior abdominal surgeries. ? ?The history is provided by the patient and medical records.  ?Back Pain ? ?  ? ?Home Medications ?Prior to Admission medications   ?Medication Sig Start Date End Date Taking? Authorizing Provider  ?albuterol (PROVENTIL HFA;VENTOLIN HFA) 108 (90 Base) MCG/ACT inhaler Inhale 1-2 puffs into the lungs every 6 (six) hours as needed for wheezing or shortness of breath.   Yes [provider]  ?Azelastine HCl 137 MCG/SPRAY SOLN  Place 2 sprays into both nostrils 2 (two) times daily as needed for allergies. 04/17/21  Yes [provider]  ?buPROPion ER (WELLBUTRIN SR) 100 MG 12 hr tablet Take 100 mg by mouth daily. 06/28/21  Yes [provider]  ?busPIRone (BUSPAR) 7.5 MG tablet Take 7.5 mg by mouth 2 (two) times daily.   Yes [provider]  ?celecoxib (CELEBREX) 100 MG capsule Take 100 mg by mouth 2 (two) times daily. 08/03/21  Yes [provider]  ?Cyanocobalamin (B-12) 2500 MCG TABS Take 1 tablet by mouth 2 (two) times a week.    Yes [provider]  ?cyclobenzaprine (FLEXERIL) 10 MG tablet Take 10 mg by mouth 2 (two) times daily as needed for muscle spasms. 07/14/21  Yes [provider]  ?DULoxetine (CYMBALTA) 30 MG capsule Take 1 capsule (30 mg total) by mouth daily. 06/24/21  Yes Narda Amber K, DO  ?folic acid (FOLVITE) 1 MG tablet Take 1 mg by mouth daily. 06/27/21  Yes [provider]  ?hydroxychloroquine (PLAQUENIL) 200 MG tablet Take 200 mg by mouth at bedtime.   Yes [provider]  ?methotrexate (RHEUMATREX) 2.5 MG tablet Take 12.5 mg by mouth every Sunday. (5 tablets) 07/19/21  Yes [provider]  ?rizatriptan (MAXALT) 10 MG tablet Take 10 mg by mouth as needed for migraine. Do not exceed 2 tablets in 24 hours   Yes [provider]  ?  doxycycline (VIBRA-TABS) 100 MG tablet Take 100 mg by mouth 2 (two) times daily. For 10 days ?Patient not taking: Reported on 08/18/2021 08/05/21   [provider]  ?pregabalin (LYRICA) 100 MG capsule Take 1 capsule (100 mg total) by mouth 2 (two) times daily. ?Patient not taking: Reported on 08/18/2021 04/06/21   Alda Berthold, DO  ?   ? ?Allergies    ?Nsaids, Carbamazepine, and Lavender oil   ? ?Review of Systems   ?Review of Systems  ?Musculoskeletal:  Positive for back pain.  ? ?Physical Exam ?Updated Vital Signs ?BP 120/89 (BP Location: Left Arm)   Pulse (!) 108   Temp 98.7 ?F (37.1 ?C) (Oral)   Resp  20   Ht 5\' 2"  (1.575 m)   Wt 65.8 kg   LMP 05/04/2018   SpO2 100%   BMI 26.52 kg/m?  ?Physical Exam ?Vitals and nursing note reviewed.  ?Constitutional:   ?   General: She is in acute distress.  ?   Appearance: She is well-developed.  ?HENT:  ?   Head: Normocephalic and atraumatic.  ?   Mouth/Throat:  ?   Mouth: Mucous membranes are dry.  ?Eyes:  ?   Pupils: Pupils are equal, round, and reactive to light.  ?Cardiovascular:  ?   Rate and Rhythm: Regular rhythm. Tachycardia present.  ?   Heart sounds: Normal heart sounds. No murmur heard. ?  No friction rub.  ?Pulmonary:  ?   Effort: Pulmonary effort is normal.  ?   Breath sounds: Normal breath sounds. No wheezing or rales.  ?Abdominal:  ?   General: Bowel sounds are normal. There is no distension.  ?   Palpations: Abdomen is soft.  ?   Tenderness: There is abdominal tenderness in the epigastric area and periumbilical area. There is no right CVA tenderness, left CVA tenderness, guarding or rebound.  ?Musculoskeletal:     ?   General: No tenderness. Normal range of motion.  ?   Cervical back: Normal range of motion and neck supple.  ?   Thoracic back: Bony tenderness present.  ?   Lumbar back: Bony tenderness present.  ?     Back: ? ?   Comments: No edema  ?Skin: ?   General: Skin is warm and dry.  ?   Findings: No rash.  ?Neurological:  ?   Mental Status: She is alert and oriented to person, place, and time. Mental status is at baseline.  ?   Cranial Nerves: No cranial nerve deficit.  ?Psychiatric:     ?   Mood and Affect: Mood normal.     ?   Behavior: Behavior normal.  ? ? ?ED Results / Procedures / Treatments   ?Labs ?(all labs ordered are listed, but only abnormal results are displayed) ?Labs Reviewed  ?CBC - Abnormal; Notable for the following components:  ?    Result Value  ? WBC 12.2 (*)   ? Hemoglobin 15.9 (*)   ? MCH 34.9 (*)   ? MCHC 37.0 (*)   ? Platelets 524 (*)   ? All other components within normal limits  ?COMPREHENSIVE METABOLIC PANEL - Abnormal;  Notable for the following components:  ? Sodium 132 (*)   ? Potassium 2.4 (*)   ? Chloride 90 (*)   ? Glucose, Bld 136 (*)   ? BUN <5 (*)   ? Anion gap 16 (*)   ? All other components within normal limits  ?URINALYSIS, ROUTINE W REFLEX MICROSCOPIC -  Abnormal; Notable for the following components:  ? APPearance HAZY (*)   ? Hgb urine dipstick LARGE (*)   ? Ketones, ur 5 (*)   ? Bacteria, UA RARE (*)   ? All other components within normal limits  ?D-DIMER, QUANTITATIVE - Abnormal; Notable for the following components:  ? D-Dimer, Quant 2.30 (*)   ? All other components within normal limits  ?LIPASE, BLOOD  ?MAGNESIUM  ?I-STAT BETA HCG BLOOD, ED ([facility], [facility], [facility] ONLY)  ?TROPONIN I (HIGH SENSITIVITY)  ?TROPONIN I (HIGH SENSITIVITY)  ? ? ?EKG ?EKG Interpretation ? ?Date/Time:  Wednesday August 18 2021 12:24:52 EDT ?Ventricular Rate:  117 ?PR Interval:  120 ?QRS Duration: 100 ?QT Interval:  380 ?QTC Calculation: 530 ?R Axis:   225 ?Text Interpretation: Sinus tachycardia Incomplete right bundle branch block Minimal voltage criteria for LVH, may be normal variant ( Cornell product ) Possible Lateral infarct , age undetermined Possible Inferior infarct , age undetermined new Prolonged QT otherwise looks similar to prior EKG on 10/14/19 When compared with ECG of 14-Oct-2019 18:34, PREVIOUS ECG IS PRESENT Confirmed by Blanchie Dessert [phone]) on 08/18/2021 2:02:21 PM ? ?Radiology ?DG Chest 2 View ? ?Result Date: 08/18/2021 ?CLINICAL DATA:  Chest pain, back pain EXAM: CHEST - 2 VIEW COMPARISON:  10/14/2019 FINDINGS: The heart size and mediastinal contours are within normal limits. Both lungs are clear. No pleural effusion or pneumothorax. The visualized skeletal structures are unremarkable. IMPRESSION: No acute process in the chest. Electronically Signed   By: Macy Mis M.D.   On: 08/18/2021 13:11   ? ?Procedures ?Procedures  ? ? ?Medications Ordered in ED ?Medications  ?metoCLOPramide (REGLAN) injection 10 mg (has no  administration in time range)  ?lactated ringers bolus 1,000 mL (has no administration in time range)  ?potassium chloride 10 mEq in 100 mL IVPB (has no administration in time range)  ?morphine (PF) 4 MG/ML injection 4 mg (h

## 2021-08-18 NOTE — ED Provider Notes (Signed)
Patient was initially seen by Dr. Anitra Lauth please see her note.  Patient has improved significantly after treatment.  Heart rate has decreased.  Patient is now tolerating fluids and food.  No recurrent vomiting.  Patient was notably hypokalemic and received several rounds of IV potassium. ?CT scan findings reviewed.  Doubt colitis.  Patient does have hiatal hernia.  She does not have hematuria so I doubt clot in the bladder.  Patient also had CT angiogram of the chest and there is no evidence of PE.  Discussed findings with the patient.  She is tolerating oral fluids.  Feel she is appropriate for continued outpatient management of her hypokalemia after her IV potassium replacement.  Discussed the importance of close follow-up with her primary care doctor to be rechecked. ?  ?Linwood Dibbles, MD ?08/18/21 2050 ? ?

## 2021-10-02 ENCOUNTER — Emergency Department (HOSPITAL_COMMUNITY)
Admission: EM | Admit: 2021-10-02 | Discharge: 2021-10-02 | Disposition: A | Discharge status: Home / Self Care | Payer: Medicaid Other | Attending: Emergency Medicine | Admitting: Emergency Medicine

## 2021-10-02 ENCOUNTER — Encounter (HOSPITAL_COMMUNITY): Payer: Self-pay

## 2021-10-02 ENCOUNTER — Other Ambulatory Visit: Payer: Self-pay

## 2021-10-02 ENCOUNTER — Emergency Department (HOSPITAL_COMMUNITY)
Admission: EM | Admit: 2021-10-02 | Discharge: 2021-10-02 | Disposition: A | Discharge status: Home / Self Care | Payer: Medicaid Other | Source: Home / Self Care | Attending: Emergency Medicine | Admitting: Emergency Medicine

## 2021-10-02 ENCOUNTER — Encounter (HOSPITAL_COMMUNITY): Payer: Self-pay | Admitting: Emergency Medicine

## 2021-10-02 DIAGNOSIS — R110 Nausea: Secondary | ICD-10-CM | POA: Diagnosis not present

## 2021-10-02 DIAGNOSIS — M545 Low back pain, unspecified: Secondary | ICD-10-CM | POA: Insufficient documentation

## 2021-10-02 DIAGNOSIS — D72829 Elevated white blood cell count, unspecified: Secondary | ICD-10-CM | POA: Insufficient documentation

## 2021-10-02 DIAGNOSIS — Z5321 Procedure and treatment not carried out due to patient leaving prior to being seen by health care provider: Secondary | ICD-10-CM | POA: Insufficient documentation

## 2021-10-02 DIAGNOSIS — M546 Pain in thoracic spine: Secondary | ICD-10-CM | POA: Insufficient documentation

## 2021-10-02 DIAGNOSIS — G8929 Other chronic pain: Secondary | ICD-10-CM

## 2021-10-02 DIAGNOSIS — E871 Hypo-osmolality and hyponatremia: Secondary | ICD-10-CM | POA: Insufficient documentation

## 2021-10-02 LAB — CBC
HCT: 43.2 % (ref 36.0–46.0)
Hemoglobin: 15.3 g/dL — ABNORMAL HIGH (ref 12.0–15.0)
MCH: 34.6 pg — ABNORMAL HIGH (ref 26.0–34.0)
MCHC: 35.4 g/dL (ref 30.0–36.0)
MCV: 97.7 fL (ref 80.0–100.0)
Platelets: 502 10*3/uL — ABNORMAL HIGH (ref 150–400)
RBC: 4.42 MIL/uL (ref 3.87–5.11)
RDW: 13.2 % (ref 11.5–15.5)
WBC: 15.1 10*3/uL — ABNORMAL HIGH (ref 4.0–10.5)
nRBC: 0 % (ref 0.0–0.2)

## 2021-10-02 LAB — I-STAT BETA HCG BLOOD, ED (MC, WL, AP ONLY): I-stat hCG, quantitative: <5 m[IU]/mL (ref <5)

## 2021-10-02 LAB — COMPREHENSIVE METABOLIC PANEL
ALT: 9 U/L (ref 0–44)
AST: 15 U/L (ref 15–41)
Albumin: 3.9 g/dL (ref 3.5–5.0)
Alkaline Phosphatase: 69 U/L (ref 38–126)
Anion gap: 17 — ABNORMAL HIGH (ref 5–15)
BUN: 17 mg/dL (ref 6–20)
CO2: 29 mmol/L (ref 22–32)
Calcium: 9.1 mg/dL (ref 8.9–10.3)
Chloride: 81 mmol/L — ABNORMAL LOW (ref 98–111)
Creatinine, Ser: 0.97 mg/dL (ref 0.44–1.00)
GFR, Estimated: >60 mL/min (ref >60)
Glucose, Bld: 91 mg/dL (ref 70–99)
Potassium: 3.1 mmol/L — ABNORMAL LOW (ref 3.5–5.1)
Sodium: 127 mmol/L — ABNORMAL LOW (ref 135–145)
Total Bilirubin: 1.1 mg/dL (ref 0.3–1.2)
Total Protein: 6.7 g/dL (ref 6.5–8.1)

## 2021-10-02 LAB — URINALYSIS, ROUTINE W REFLEX MICROSCOPIC
Bilirubin Urine: NEGATIVE
Glucose, UA: NEGATIVE mg/dL
Ketones, ur: 20 mg/dL — AB
Leukocytes,Ua: NEGATIVE
Nitrite: NEGATIVE
Protein, ur: NEGATIVE mg/dL
Specific Gravity, Urine: 1.009 (ref 1.005–1.030)
pH: 6 (ref 5.0–8.0)

## 2021-10-02 LAB — LIPASE, BLOOD: Lipase: 21 U/L (ref 11–51)

## 2021-10-02 MED ORDER — ONDANSETRON 4 MG PO TBDP: 4.0000 mg | ORAL_TABLET | Freq: Once | ORAL | Status: DC | PRN

## 2021-10-02 MED ORDER — HYDROMORPHONE HCL 1 MG/ML IJ SOLN
1.0000 mg | Freq: Once | INTRAMUSCULAR | Status: AC
  Administered 2021-10-02: 1 mg via INTRAMUSCULAR
  Filled 2021-10-02: qty 1

## 2021-10-02 MED ORDER — POTASSIUM CHLORIDE CRYS ER 20 MEQ PO TBCR
40.0000 meq | EXTENDED_RELEASE_TABLET | Freq: Once | ORAL | Status: AC
Start: 2021-10-02 — End: 2021-10-02
  Administered 2021-10-02: 40 meq via ORAL
  Filled 2021-10-02: qty 2

## 2021-10-02 NOTE — ED Provider Triage Note (Signed)
Emergency Medicine Provider Triage Evaluation Note  Laura Montoya , a 36 y.o. female  was evaluated in triage.  Pt complains of back pain acute on chronic.  She describes a sharp sensation to her thoracic spine radiating into her lumbar spine, exacerbated with ambulation.  Currently regimen at home includes Celebrex, also relaxers, Plaquenil?Marland Kitchen  She reports she did not take any of this medication as she was unable to keep anything down 2 days ago.  However, now she is tolerating p.o.  No red flag such as fever, cancer, IV drug use.  Review of Systems  Positive: Back pain Negative: Fever, urinary symptoms  Physical Exam  BP (!) 125/111 (BP Location: Right Arm)   Pulse (!) 120   Temp 98.7 F (37.1 C) (Oral)   Resp 17   LMP 05/04/2018   SpO2 96%  Gen:   Awake, no distress   Resp:  Normal effort  MSK:   Moves extremities without difficulty  Other:  Palpation along the lumbar spine worsened on right over left.  Good hip extension and flexion.  Normal gait.  Medical Decision Making  Medically screening exam initiated at 4:17 PM.  Appropriate orders placed.  Laura Montoya was informed that the remainder of the evaluation will be completed by another provider, this initial triage assessment does not replace that evaluation, and the importance of remaining in the ED until their evaluation is complete.  Here with acute on chronic back pain, last imaging done approximately a month ago.  Followed by pain management.  They received injections approximately 9 days ago per chart review.  States she did not take any of her pain medication on today's visit.   Laura Fitting, PA-C 10/02/21 1622

## 2021-10-02 NOTE — Discharge Instructions (Signed)
Call your primary care doctor or specialist as discussed in the next 2-3 days.   Return immediately back to the ER if:  Your symptoms worsen within the next 12-24 hours. You develop new symptoms such as new fevers, persistent vomiting, new pain, shortness of breath, or new weakness or numbness, or if you have any other concerns.  

## 2021-10-02 NOTE — ED Notes (Signed)
E-signature pad unavailable at time of pt discharge. This RN discussed discharge materials with pt and answered all pt questions. Pt stated understanding of discharge material. ? ?

## 2021-10-02 NOTE — ED Triage Notes (Signed)
Pt reported to ED with c/o recurrent back pain to lower thoracic- lumbar area of back. Pt states she has also endorses nausea but reports hx of chronic nausea d/t hiatal hernia.

## 2021-10-02 NOTE — ED Provider Notes (Signed)
[facility] EMERGENCY DEPARTMENT Provider Note   CSN: [id number] Arrival date & time: 10/02/21  1556     History  Chief Complaint  Patient presents with   Back Pain    Laura Montoya is a 36 y.o. female.  Patient with history of chronic back pain, presents with acute exacerbation this morning.  She states that sometimes when she wakes up "funny," she will get worsening of her pain which she feels like she did this morning.  Otherwise denies any new fall or trauma no new activity.  She has had recent SI joint injections with her pain specialist approximately 2 weeks ago with significant improvement until waking up this morning.  Denies any new numbness or weakness.  Denies fevers or cough.  She had a history of vomiting that resolved 2 days ago.  Denies any IV drug use no recent weight loss no new numbness or weakness per patient.  She was seen earlier this morning had blood test done but left without being seen, but returns this evening for evaluation.       Home Medications Prior to Admission medications   Medication Sig Start Date End Date Taking? Authorizing Provider  albuterol (PROVENTIL HFA;VENTOLIN HFA) 108 (90 Base) MCG/ACT inhaler Inhale 1-2 puffs into the lungs every 6 (six) hours as needed for wheezing or shortness of breath.    [provider]  Azelastine HCl 137 MCG/SPRAY SOLN Place 2 sprays into both nostrils 2 (two) times daily as needed for allergies. 04/17/21   [provider]  buPROPion ER (WELLBUTRIN SR) 100 MG 12 hr tablet Take 100 mg by mouth daily. 06/28/21   [provider]  busPIRone (BUSPAR) 7.5 MG tablet Take 7.5 mg by mouth 2 (two) times daily.    [provider]  celecoxib (CELEBREX) 100 MG capsule Take 100 mg by mouth 2 (two) times daily. 08/03/21   [provider]  Cyanocobalamin (B-12) 2500 MCG TABS Take 1 tablet by mouth 2 (two) times a week.     [provider]  cyclobenzaprine  (FLEXERIL) 10 MG tablet Take 10 mg by mouth 2 (two) times daily as needed for muscle spasms. 07/14/21   [provider]  doxycycline (VIBRA-TABS) 100 MG tablet Take 100 mg by mouth 2 (two) times daily. For 10 days Patient not taking: Reported on 08/18/2021 08/05/21   [provider]  DULoxetine (CYMBALTA) 30 MG capsule Take 1 capsule (30 mg total) by mouth daily. 06/24/21   Nita Sickle K, DO  folic acid (FOLVITE) 1 MG tablet Take 1 mg by mouth daily. 06/27/21   [provider]  hydroxychloroquine (PLAQUENIL) 200 MG tablet Take 200 mg by mouth at bedtime.    [provider]  methotrexate (RHEUMATREX) 2.5 MG tablet Take 12.5 mg by mouth every Sunday. (5 tablets) 07/19/21   [provider]  ondansetron (ZOFRAN-ODT) 8 MG disintegrating tablet Take 1 tablet (8 mg total) by mouth every 8 (eight) hours as needed for nausea or vomiting. 08/18/21   Linwood Dibbles, MD  pantoprazole (PROTONIX) 40 MG tablet Take 1 tablet (40 mg total) by mouth daily. 08/18/21   Linwood Dibbles, MD  potassium chloride SA (KLOR-CON M) 20 MEQ tablet Take 1 tablet (20 mEq total) by mouth 2 (two) times daily for 7 days. 08/18/21 08/25/21  Linwood Dibbles, MD  pregabalin (LYRICA) 100 MG capsule Take 1 capsule (100 mg total) by mouth 2 (two) times daily. Patient not taking: Reported on 08/18/2021 04/06/21  Patel, Donika K, DO  rizatriptan (MAXALT) 10 MG tablet Take 10 mg by mouth as needed for migraine. Do not exceed 2 tablets in 24 hours    [provider]      Allergies    Nsaids, Carbamazepine, and Lavender oil    Review of Systems   Review of Systems  Constitutional:  Negative for fever.  HENT:  Negative for ear pain.   Eyes:  Negative for pain.  Respiratory:  Negative for cough.   Cardiovascular:  Negative for chest pain.  Gastrointestinal:  Negative for abdominal pain.  Genitourinary:  Negative for flank pain.  Musculoskeletal:  Positive for back pain.  Skin:  Negative for rash.   Neurological:  Negative for headaches.    Physical Exam Updated Vital Signs BP 118/81   Pulse 97   Temp 98.7 F (37.1 C) (Oral)   Resp 14   Ht 5\' 3"  (1.6 m)   Wt 67.6 kg   LMP 10/02/2021 (Exact Date)   SpO2 99%   BMI 26.39 kg/m  Physical Exam Constitutional:      General: She is not in acute distress.    Appearance: Normal appearance.  HENT:     Head: Normocephalic.     Nose: Nose normal.  Eyes:     Extraocular Movements: Extraocular movements intact.  Cardiovascular:     Rate and Rhythm: Normal rate.  Pulmonary:     Effort: Pulmonary effort is normal.  Musculoskeletal:        General: Normal range of motion.     Cervical back: Normal range of motion.     Comments: No CT or L-spine step-offs or tenderness noted.  No CVA tenderness noted.  Gait is mildly antalgic.  Neurological:     General: No focal deficit present.     Mental Status: She is alert. Mental status is at baseline.     Comments: No saddle anesthesia, 5/5 strength all extremities.     ED Results / Procedures / Treatments   Labs (all labs ordered are listed, but only abnormal results are displayed) Labs Reviewed - No data to display  EKG None  Radiology No results found.  Procedures Procedures    Medications Ordered in ED Medications  HYDROmorphone (DILAUDID) injection 1 mg (has no administration in time range)  potassium chloride SA (KLOR-CON M) CR tablet 40 mEq (has no administration in time range)    ED Course/ Medical Decision Making/ A&P                           Medical Decision Making Risk Prescription drug management.   Review of records shows prior labs done this morning showing potassium of 3, mild hyponatremia, white count elevation.  Sepsis considered, however she has no reports of fever.  Suspect may be related to history of vomiting.  Patient denies any new neuro symptoms.  No red flag signs noted.  Given a shot of Dilaudid, discharged home in stable condition, advised  outpatient follow-up with her pain specialist Monday.  Advising immediate return for new numbness weakness fevers worsening symptoms or any additional concerns.        Final Clinical Impression(s) / ED Diagnoses Final diagnoses:  Chronic midline low back pain without sciatica    Rx / DC Orders ED Discharge Orders     None         Monday, MD 10/02/21 2227

## 2021-10-02 NOTE — ED Triage Notes (Signed)
Pt to ED POV. Pt c/o lower back pain, hx of same.  Pt denies bowel/bladder incontinence. Pt has had nausea.  Pt denies numbness/tingling.

## 2021-10-04 ENCOUNTER — Emergency Department (HOSPITAL_COMMUNITY)
Admission: EM | Admit: 2021-10-04 | Discharge: 2021-10-05 | Disposition: A | Discharge status: Home / Self Care | Payer: Medicaid Other | Attending: Emergency Medicine | Admitting: Emergency Medicine

## 2021-10-04 ENCOUNTER — Encounter (HOSPITAL_COMMUNITY): Payer: Self-pay

## 2021-10-04 ENCOUNTER — Other Ambulatory Visit: Payer: Self-pay

## 2021-10-04 DIAGNOSIS — D72829 Elevated white blood cell count, unspecified: Secondary | ICD-10-CM | POA: Diagnosis not present

## 2021-10-04 DIAGNOSIS — E876 Hypokalemia: Secondary | ICD-10-CM | POA: Diagnosis not present

## 2021-10-04 DIAGNOSIS — R112 Nausea with vomiting, unspecified: Secondary | ICD-10-CM | POA: Insufficient documentation

## 2021-10-04 DIAGNOSIS — J45909 Unspecified asthma, uncomplicated: Secondary | ICD-10-CM | POA: Diagnosis not present

## 2021-10-04 DIAGNOSIS — M546 Pain in thoracic spine: Secondary | ICD-10-CM | POA: Diagnosis not present

## 2021-10-04 DIAGNOSIS — G8929 Other chronic pain: Secondary | ICD-10-CM | POA: Insufficient documentation

## 2021-10-04 DIAGNOSIS — M545 Low back pain, unspecified: Secondary | ICD-10-CM | POA: Diagnosis present

## 2021-10-04 LAB — CBC WITH DIFFERENTIAL/PLATELET
Abs Immature Granulocytes: 0.07 10*3/uL (ref 0.00–0.07)
Basophils Absolute: 0.1 10*3/uL (ref 0.0–0.1)
Basophils Relative: 0 %
Eosinophils Absolute: 0 10*3/uL (ref 0.0–0.5)
Eosinophils Relative: 0 %
HCT: 40.8 % (ref 36.0–46.0)
Hemoglobin: 15.3 g/dL — ABNORMAL HIGH (ref 12.0–15.0)
Immature Granulocytes: 0 %
Lymphocytes Relative: 13 %
Lymphs Abs: 2.2 10*3/uL (ref 0.7–4.0)
MCH: 35.3 pg — ABNORMAL HIGH (ref 26.0–34.0)
MCHC: 37.5 g/dL — ABNORMAL HIGH (ref 30.0–36.0)
MCV: 94.2 fL (ref 80.0–100.0)
Monocytes Absolute: 1 10*3/uL (ref 0.1–1.0)
Monocytes Relative: 6 %
Neutro Abs: 13.2 10*3/uL — ABNORMAL HIGH (ref 1.7–7.7)
Neutrophils Relative %: 81 %
Platelets: 481 10*3/uL — ABNORMAL HIGH (ref 150–400)
RBC: 4.33 MIL/uL (ref 3.87–5.11)
RDW: 12.9 % (ref 11.5–15.5)
WBC: 16.6 10*3/uL — ABNORMAL HIGH (ref 4.0–10.5)
nRBC: 0 % (ref 0.0–0.2)

## 2021-10-04 LAB — COMPREHENSIVE METABOLIC PANEL
ALT: 9 U/L (ref 0–44)
AST: 17 U/L (ref 15–41)
Albumin: 4.1 g/dL (ref 3.5–5.0)
Alkaline Phosphatase: 63 U/L (ref 38–126)
Anion gap: 18 — ABNORMAL HIGH (ref 5–15)
BUN: 10 mg/dL (ref 6–20)
CO2: 27 mmol/L (ref 22–32)
Calcium: 9.6 mg/dL (ref 8.9–10.3)
Chloride: 82 mmol/L — ABNORMAL LOW (ref 98–111)
Creatinine, Ser: 1 mg/dL (ref 0.44–1.00)
GFR, Estimated: >60 mL/min (ref >60)
Glucose, Bld: 127 mg/dL — ABNORMAL HIGH (ref 70–99)
Potassium: 2.8 mmol/L — ABNORMAL LOW (ref 3.5–5.1)
Sodium: 127 mmol/L — ABNORMAL LOW (ref 135–145)
Total Bilirubin: 1 mg/dL (ref 0.3–1.2)
Total Protein: 6.6 g/dL (ref 6.5–8.1)

## 2021-10-04 LAB — I-STAT BETA HCG BLOOD, ED (MC, WL, AP ONLY): I-stat hCG, quantitative: <5 m[IU]/mL (ref <5)

## 2021-10-04 LAB — LIPASE, BLOOD: Lipase: 24 U/L (ref 11–51)

## 2021-10-04 NOTE — ED Triage Notes (Signed)
Pt began having 9/10 back pain last tuesday. Denies injury. Tender with palpation. Denies urinary symptoms.

## 2021-10-04 NOTE — ED Provider Triage Note (Signed)
Emergency Medicine Provider Triage Evaluation Note  Laura Montoya , a 36 y.o. female  was evaluated in triage.  Pt complains of continuing back pain.  Rated 9 out of 10.  Also with abdominal pain, new onset of nausea and bilious vomiting today.  Denies fevers or hemoptysis or IV drug use.  Denies urinary symptoms, constipation, chest pain, shortness of breath.  Review of Systems  Positive:  Negative: As above  Physical Exam  BP (!) 127/91 (BP Location: Right Arm)   Pulse (!) 116   Temp 98.4 F (36.9 C) (Oral)   Resp 16   Ht 5\' 3"  (1.6 m)   Wt 68 kg   LMP 10/04/2021 (Exact Date)   SpO2 100%   BMI 26.57 kg/m  Gen:   Awake, no distress   Resp:  Normal effort, CTAB MSK:   Moves extremities without difficulty  Other:  Paraspinal tenderness of the lower thoracic, upper lumbar region.  No midline tenderness.  Mild umbilical and epigastric abdominal tenderness.  Abdomen soft.  Medical Decision Making  Medically screening exam initiated at 9:56 PM.  Appropriate orders placed.  12/04/2021 was informed that the remainder of the evaluation will be completed by another provider, this initial triage assessment does not replace that evaluation, and the importance of remaining in the ED until their evaluation is complete.     [name], PA-C 10/04/21 2203

## 2021-10-05 MED ORDER — POTASSIUM CHLORIDE ER 10 MEQ PO TBCR: 10.0000 meq | EXTENDED_RELEASE_TABLET | Freq: Every day | ORAL | 0 refills | Status: DC

## 2021-10-05 MED ORDER — OXYCODONE HCL 5 MG PO TABS
5.0000 mg | ORAL_TABLET | Freq: Once | ORAL | Status: AC
  Administered 2021-10-05: 5 mg via ORAL
  Filled 2021-10-05: qty 1

## 2021-10-05 MED ORDER — MAGNESIUM SULFATE IN D5W 1-5 GM/100ML-% IV SOLN
1.0000 g | Freq: Once | INTRAVENOUS | Status: AC
  Administered 2021-10-05: 1 g via INTRAVENOUS
  Filled 2021-10-05: qty 100

## 2021-10-05 MED ORDER — SODIUM CHLORIDE 0.9 % IV BOLUS
1000.0000 mL | Freq: Once | INTRAVENOUS | Status: AC
  Administered 2021-10-05: 1000 mL via INTRAVENOUS

## 2021-10-05 MED ORDER — POTASSIUM CHLORIDE CRYS ER 20 MEQ PO TBCR
40.0000 meq | EXTENDED_RELEASE_TABLET | Freq: Once | ORAL | Status: AC
  Administered 2021-10-05: 40 meq via ORAL
  Filled 2021-10-05: qty 2

## 2021-10-05 MED ORDER — DIAZEPAM 5 MG/ML IJ SOLN
5.0000 mg | Freq: Once | INTRAMUSCULAR | Status: AC
  Administered 2021-10-05: 5 mg via INTRAVENOUS
  Filled 2021-10-05: qty 2

## 2021-10-05 NOTE — ED Provider Notes (Addendum)
[facility] EMERGENCY DEPARTMENT Provider Note   CSN: [id number] Arrival date & time: 10/04/21  2125     History  Chief Complaint  Patient presents with   Back Pain    Laura Montoya is a 36 y.o. female.  36 year old female with history of asthma, scoliosis, GERD, bipolar, RA, presents with complaint of low back pain, acute on chronic in nature. Associated with nausea and vomiting which she states commonly happens when her pain is this severe. Denies prior spine surgery, fevers/chills, falls/injuries, changes in her chronic back pain.       Home Medications Prior to Admission medications   Medication Sig Start Date End Date Taking? Authorizing Provider  albuterol (PROVENTIL HFA;VENTOLIN HFA) 108 (90 Base) MCG/ACT inhaler Inhale 1-2 puffs into the lungs every 6 (six) hours as needed for wheezing or shortness of breath.   Yes [provider]  Azelastine HCl 137 MCG/SPRAY SOLN Place 2 sprays into both nostrils 2 (two) times daily as needed for allergies. 04/17/21  Yes [provider]  buPROPion ER (WELLBUTRIN SR) 100 MG 12 hr tablet Take 100 mg by mouth daily. 06/28/21  Yes [provider]  busPIRone (BUSPAR) 7.5 MG tablet Take 7.5 mg by mouth 2 (two) times daily.   Yes [provider]  celecoxib (CELEBREX) 200 MG capsule Take 200 mg by mouth 2 (two) times daily. 09/22/21  Yes [provider]  Cyanocobalamin (B-12) 2500 MCG TABS Take 1 tablet by mouth daily.   Yes [provider]  cyclobenzaprine (FLEXERIL) 10 MG tablet Take 10 mg by mouth 2 (two) times daily as needed for muscle spasms. 07/14/21  Yes [provider]  DULoxetine (CYMBALTA) 30 MG capsule Take 1 capsule (30 mg total) by mouth daily. 06/24/21  Yes Patel, Donika K, DO  folic acid (FOLVITE) 1 MG tablet Take 1 mg by mouth daily. 06/27/21  Yes [provider]  hydroxychloroquine (PLAQUENIL) 200 MG tablet Take 200 mg by mouth at bedtime.    Yes [provider]  methotrexate (RHEUMATREX) 2.5 MG tablet Take 12.5 mg by mouth every Sunday. (5 tablets) 07/19/21  Yes [provider]  ondansetron (ZOFRAN-ODT) 8 MG disintegrating tablet Take 1 tablet (8 mg total) by mouth every 8 (eight) hours as needed for nausea or vomiting. 08/18/21  Yes Dorie Rank, MD  pantoprazole (PROTONIX) 40 MG tablet Take 1 tablet (40 mg total) by mouth daily. 08/18/21  Yes Dorie Rank, MD  potassium chloride (KLOR-CON) 10 MEQ tablet Take 1 tablet (10 mEq total) by mouth daily for 5 days. 10/05/21 10/10/21 Yes Tacy Learn, PA-C  rizatriptan (MAXALT) 10 MG tablet Take 10 mg by mouth as needed for migraine. Do not exceed 2 tablets in 24 hours   Yes [provider]  doxycycline (VIBRA-TABS) 100 MG tablet Take 100 mg by mouth 2 (two) times daily. For 10 days Patient not taking: Reported on 08/18/2021 08/05/21   [provider]  potassium chloride SA (KLOR-CON M) 20 MEQ tablet Take 1 tablet (20 mEq total) by mouth 2 (two) times daily for 7 days. Patient not taking: Reported on 10/05/2021 08/18/21 08/25/21  Dorie Rank, MD  pregabalin (LYRICA) 100 MG capsule Take 1 capsule (100 mg total) by mouth 2 (two) times daily. Patient not taking: Reported on 08/18/2021 04/06/21   Narda Amber K, DO      Allergies    Nsaids, Carbamazepine, and Lavender oil    Review of Systems   Review of  Systems Negative except as per HPI Physical Exam Updated Vital Signs BP 109/79 (BP Location: Left Arm)   Pulse 77   Temp 98.4 F (36.9 C) (Oral)   Resp 19   Ht 5\' 3"  (1.6 m)   Wt 68 kg   LMP 10/04/2021 (Exact Date)   SpO2 97%   BMI 26.57 kg/m  Physical Exam Vitals and nursing note reviewed.  Constitutional:      General: She is not in acute distress.    Appearance: She is well-developed. She is not diaphoretic.  HENT:     Head: Normocephalic and atraumatic.  Cardiovascular:     Pulses: Normal pulses.  Pulmonary:     Effort: Pulmonary effort is  normal.  Abdominal:     Palpations: Abdomen is soft.     Tenderness: There is no abdominal tenderness.  Musculoskeletal:        General: Tenderness present. No swelling or deformity.     Thoracic back: Tenderness present. No bony tenderness.     Lumbar back: Tenderness present. No bony tenderness.       Back:     Right lower leg: No edema.     Left lower leg: No edema.  Skin:    General: Skin is warm and dry.     Findings: No erythema or rash.  Neurological:     Mental Status: She is alert and oriented to person, place, and time.     Sensory: No sensory deficit.     Motor: No weakness.     Deep Tendon Reflexes: Babinski sign absent on the right side. Babinski sign absent on the left side.     Reflex Scores:      Patellar reflexes are 1+ on the right side and 1+ on the left side.      Achilles reflexes are 1+ on the right side and 1+ on the left side.    Comments: Reports minimal reflexes in her legs at baseline  Psychiatric:        Behavior: Behavior normal.     ED Results / Procedures / Treatments   Labs (all labs ordered are listed, but only abnormal results are displayed) Labs Reviewed  CBC WITH DIFFERENTIAL/PLATELET - Abnormal; Notable for the following components:      Result Value   WBC 16.6 (*)    Hemoglobin 15.3 (*)    MCH 35.3 (*)    MCHC 37.5 (*)    Platelets 481 (*)    Neutro Abs 13.2 (*)    All other components within normal limits  COMPREHENSIVE METABOLIC PANEL - Abnormal; Notable for the following components:   Sodium 127 (*)    Potassium 2.8 (*)    Chloride 82 (*)    Glucose, Bld 127 (*)    Anion gap 18 (*)    All other components within normal limits  LIPASE, BLOOD  URINALYSIS, ROUTINE W REFLEX MICROSCOPIC  I-STAT BETA HCG BLOOD, ED ([facility], [facility], [facility] ONLY)    EKG None  Radiology No results found.  Procedures .Critical Care  Performed by: Tacy Learn, PA-C Authorized by: Tacy Learn, PA-C   Critical care provider statement:     Critical care time (minutes):  30   Critical care was time spent personally by me on the following activities:  Development of treatment plan with patient or surrogate, discussions with consultants, evaluation of patient's response to treatment, examination of patient, ordering and review of laboratory studies, ordering and review of radiographic studies, ordering and  performing treatments and interventions, pulse oximetry, re-evaluation of patient's condition and review of old charts     Medications Ordered in ED Medications  sodium chloride 0.9 % bolus 1,000 mL (0 mLs Intravenous Stopped 10/05/21 1054)  potassium chloride SA (KLOR-CON M) CR tablet 40 mEq (40 mEq Oral Given 10/05/21 0859)  diazepam (VALIUM) injection 5 mg (5 mg Intravenous Given 10/05/21 0859)  oxyCODONE (Oxy IR/ROXICODONE) immediate release tablet 5 mg (5 mg Oral Given 10/05/21 0859)  magnesium sulfate IVPB 1 g 100 mL (0 g Intravenous Stopped 10/05/21 1001)    ED Course/ Medical Decision Making/ A&P                           Medical Decision Making Risk Prescription drug management.   This patient presents to the ED for concern of back pain with nausea and vomiting, this involves an extensive number of treatment options, and is a complaint that carries with it a high risk of complications and morbidity.  The differential diagnosis includes but not limited to   Co morbidities that complicate the patient evaluation  RA, scoliosis, GERD, chronic pain, prolonged QT   Additional history obtained:  External records from outside source obtained and reviewed including notes from 09/17/21 visit to pain management for fluro guided SI joint injection (bilateral)   Lab Tests:  I Ordered, and personally interpreted labs.  The pertinent results include: CBC with nonspecific leukocytosis at 16.6, slight uptrend when compared to prior.  CMP with glucose of 127, sodium 127 with potassium of 2.8 and anion gap of 18.  hCG negative,  lipase normal.  Cardiac Monitoring: / EKG:  The patient was maintained on a cardiac monitor.  I personally viewed and interpreted the cardiac monitored which showed an underlying rhythm of: sinus rhythm, rate 91   Consultations Obtained:  I requested consultation with the ER attending, Dr. Dennis Bast,  and discussed lab and imaging findings as well as pertinent plan - they recommend: Replace potassium and magnesium, DC with PCP follow-up    Problem List / ED Course / Critical interventions / Medication management  36 year old female with past medical history of low back pain managed outpatient by pain management specialist with recent bilateral SI joint injections presents with acute on chronic low back pain.  Reports pain to be her typical pain pattern without new or different symptoms, denies fevers, abdominal pain, groin paresthesia or leg weakness.  On exam she has left and right lumbar paraspinous tenderness, no midline or bony tenderness.  She has equal strength in her lower extremities with symmetric reflexes although patient notes her reflexes are always minimal.  She has been vomiting, states this is what happens when her pain gets to be so severe.  Patient is found to have moderate hypokalemia with a potassium of 2.8.  This was replaced orally as well as given magnesium.  Patient was given Valium and oxycodone for her pain (history of QT prolongation) and vomiting.  On recheck, her pain has resolved, her vomiting is resolved and she is tolerating p.o. fluids and feeling much improved.  Patient is taking an over-the-counter potassium supplement, she is advised to hold this for the next few days and given prescription for potassium supplement, recommend recheck with her PCP for repeat lab draw. I have reviewed the patients home medicines and have made adjustments as needed   Social Determinants of Health:  has PCP care as well as pain management   Test /  Admission -  Considered:  Consider MRI for low back pain however as pain is typical pattern without new or different symptoms and no red flags at this time, will hold off on imaging.         Final Clinical Impression(s) / ED Diagnoses Final diagnoses:  Chronic bilateral low back pain without sciatica  Nausea and vomiting, unspecified vomiting type  Hypokalemia    Rx / DC Orders ED Discharge Orders          Ordered    potassium chloride (KLOR-CON) 10 MEQ tablet  Daily        10/05/21 1056              Tacy Learn, PA-C 10/05/21 1235    Tacy Learn, PA-C 10/05/21 1235    Regan Lemming, MD 10/05/21 1527

## 2021-10-05 NOTE — ED Notes (Signed)
Reviewed discharge instructions with patient and father. Follow-up care and medications reviewed. Patient and father verbalized understanding. Patient A&Ox4, VSS, and ambulatory with steady gait upon discharge. 

## 2021-10-05 NOTE — Discharge Instructions (Signed)
Follow-up with your doctor within the coming week for recheck potassium level. HOLD your over the counter potassium supplement, a prescription as been sent in to take for the next 5 days.

## 2021-11-23 ENCOUNTER — Emergency Department (HOSPITAL_COMMUNITY)
Admission: EM | Admit: 2021-11-23 | Discharge: 2021-11-23 | Disposition: A | Discharge status: Home / Self Care | Payer: Medicaid Other | Attending: Emergency Medicine | Admitting: Emergency Medicine

## 2021-11-23 ENCOUNTER — Emergency Department (HOSPITAL_COMMUNITY): Payer: Medicaid Other

## 2021-11-23 ENCOUNTER — Other Ambulatory Visit: Payer: Self-pay

## 2021-11-23 DIAGNOSIS — F1290 Cannabis use, unspecified, uncomplicated: Secondary | ICD-10-CM | POA: Diagnosis not present

## 2021-11-23 DIAGNOSIS — R112 Nausea with vomiting, unspecified: Secondary | ICD-10-CM | POA: Diagnosis not present

## 2021-11-23 DIAGNOSIS — R000 Tachycardia, unspecified: Secondary | ICD-10-CM | POA: Insufficient documentation

## 2021-11-23 DIAGNOSIS — R109 Unspecified abdominal pain: Secondary | ICD-10-CM | POA: Diagnosis not present

## 2021-11-23 LAB — CBC WITH DIFFERENTIAL/PLATELET
Abs Immature Granulocytes: 0.04 10*3/uL (ref 0.00–0.07)
Basophils Absolute: 0 10*3/uL (ref 0.0–0.1)
Basophils Relative: 0 %
Eosinophils Absolute: 0 10*3/uL (ref 0.0–0.5)
Eosinophils Relative: 0 %
HCT: 44 % (ref 36.0–46.0)
Hemoglobin: 16.3 g/dL — ABNORMAL HIGH (ref 12.0–15.0)
Immature Granulocytes: 0 %
Lymphocytes Relative: 28 %
Lymphs Abs: 3 10*3/uL (ref 0.7–4.0)
MCH: 35.2 pg — ABNORMAL HIGH (ref 26.0–34.0)
MCHC: 37 g/dL — ABNORMAL HIGH (ref 30.0–36.0)
MCV: 95 fL (ref 80.0–100.0)
Monocytes Absolute: 0.7 10*3/uL (ref 0.1–1.0)
Monocytes Relative: 6 %
Neutro Abs: 6.7 10*3/uL (ref 1.7–7.7)
Neutrophils Relative %: 66 %
Platelets: 500 10*3/uL — ABNORMAL HIGH (ref 150–400)
RBC: 4.63 MIL/uL (ref 3.87–5.11)
RDW: 12.6 % (ref 11.5–15.5)
WBC: 10.4 10*3/uL (ref 4.0–10.5)
nRBC: 0 % (ref 0.0–0.2)

## 2021-11-23 LAB — URINALYSIS, ROUTINE W REFLEX MICROSCOPIC
Bilirubin Urine: NEGATIVE
Glucose, UA: NEGATIVE mg/dL
Hgb urine dipstick: NEGATIVE
Ketones, ur: 20 mg/dL — AB
Leukocytes,Ua: NEGATIVE
Nitrite: NEGATIVE
Protein, ur: NEGATIVE mg/dL
Specific Gravity, Urine: 1.009 (ref 1.005–1.030)
pH: 6 (ref 5.0–8.0)

## 2021-11-23 LAB — COMPREHENSIVE METABOLIC PANEL
ALT: 9 U/L (ref 0–44)
AST: 15 U/L (ref 15–41)
Albumin: 4.1 g/dL (ref 3.5–5.0)
Alkaline Phosphatase: 59 U/L (ref 38–126)
Anion gap: 12 (ref 5–15)
BUN: 6 mg/dL (ref 6–20)
CO2: 32 mmol/L (ref 22–32)
Calcium: 9.7 mg/dL (ref 8.9–10.3)
Chloride: 90 mmol/L — ABNORMAL LOW (ref 98–111)
Creatinine, Ser: 0.92 mg/dL (ref 0.44–1.00)
GFR, Estimated: >60 mL/min (ref >60)
Glucose, Bld: 97 mg/dL (ref 70–99)
Potassium: 2.9 mmol/L — ABNORMAL LOW (ref 3.5–5.1)
Sodium: 134 mmol/L — ABNORMAL LOW (ref 135–145)
Total Bilirubin: 0.8 mg/dL (ref 0.3–1.2)
Total Protein: 7 g/dL (ref 6.5–8.1)

## 2021-11-23 LAB — I-STAT BETA HCG BLOOD, ED (MC, WL, AP ONLY): I-stat hCG, quantitative: <5 m[IU]/mL

## 2021-11-23 LAB — LIPASE, BLOOD: Lipase: 24 U/L (ref 11–51)

## 2021-11-23 LAB — TROPONIN I (HIGH SENSITIVITY): Troponin I (High Sensitivity): 5 ng/L

## 2021-11-23 MED ORDER — TRIMETHOBENZAMIDE HCL 100 MG/ML IM SOLN
200.0000 mg | Freq: Once | INTRAMUSCULAR | Status: AC
  Administered 2021-11-23: 200 mg via INTRAMUSCULAR
  Filled 2021-11-23: qty 2

## 2021-11-23 MED ORDER — SODIUM CHLORIDE 0.9 % IV BOLUS
1000.0000 mL | Freq: Once | INTRAVENOUS | Status: AC
  Administered 2021-11-23: 1000 mL via INTRAVENOUS

## 2021-11-23 MED ORDER — ONDANSETRON HCL 4 MG/2ML IJ SOLN
4.0000 mg | Freq: Once | INTRAMUSCULAR | Status: AC
Start: 2021-11-23 — End: 2021-11-23
  Administered 2021-11-23: 4 mg via INTRAVENOUS
  Filled 2021-11-23: qty 2

## 2021-11-23 MED ORDER — LORAZEPAM 2 MG/ML IJ SOLN
1.0000 mg | Freq: Once | INTRAMUSCULAR | Status: AC
  Administered 2021-11-23: 1 mg via INTRAVENOUS
  Filled 2021-11-23: qty 1

## 2021-11-23 NOTE — ED Triage Notes (Signed)
Pt here for abd pain w/ some burning in chest w/ N/V since Friday. Pt reports she smoked marijuana on Friday but vomiting has continued. States she feels dehydrated.

## 2021-11-23 NOTE — ED Provider Triage Note (Signed)
Emergency Medicine Provider Triage Evaluation Note  Laura Montoya , a 36 y.o. female  was evaluated in triage.  Pt complains of abdominal pain associated with numerous episodes of emesis.  Admits to some blood in emesis.  No diarrhea.  Denies fever.  No vaginal or urinary symptoms.  Admits to smoking marijuana.  Also endorses intermittent central chest pain.  Review of Systems  Positive: Abdominal pain, CP, N/V Negative: fever  Physical Exam  There were no vitals taken for this visit. Gen:   Awake, no distress   Resp:  Normal effort  MSK:   Moves extremities without difficulty  Other:  Diffuse abdominal tenderness  Medical Decision Making  Medically screening exam initiated at 7:32 PM.  Appropriate orders placed.  [name] was informed that the remainder of the evaluation will be completed by another provider, this initial triage assessment does not replace that evaluation, and the importance of remaining in the ED until their evaluation is complete.  Abdominal pain +N/V Labs ordered   [name] 11/23/21 1934

## 2021-11-23 NOTE — ED Notes (Signed)
Patient verbalizes understanding of discharge instructions. Opportunity for questioning and answers were provided. Armband removed by staff, pt discharged from ED. Ambulated out to lobby with mother  

## 2021-11-23 NOTE — ED Provider Notes (Signed)
[facility] EMERGENCY DEPARTMENT Provider Note   CSN: [id number] Arrival date & time: 11/23/21  1859     History  Chief Complaint  Patient presents with   Abdominal Pain   Emesis    Laura Montoya is a 36 y.o. female.  Patient here with nausea, vomiting after marijuana use.  History of the same.  She is feeling a lot better now.  This has been ongoing for about a day or 2 but she is fairly asymptomatic now.  Nothing makes it worse or better.  Denies any active abdominal pain, chest pain, shortness of breath.  She denies any fevers or chills.  Denies any pain with urination.  Denies any blood or dark stools.  The history is provided by the patient.       Home Medications Prior to Admission medications   Medication Sig Start Date End Date Taking? Authorizing Provider  albuterol (PROVENTIL HFA;VENTOLIN HFA) 108 (90 Base) MCG/ACT inhaler Inhale 1-2 puffs into the lungs every 6 (six) hours as needed for wheezing or shortness of breath.    [provider]  Azelastine HCl 137 MCG/SPRAY SOLN Place 2 sprays into both nostrils 2 (two) times daily as needed for allergies. 04/17/21   [provider]  buPROPion ER (WELLBUTRIN SR) 100 MG 12 hr tablet Take 100 mg by mouth daily. 06/28/21   [provider]  busPIRone (BUSPAR) 7.5 MG tablet Take 7.5 mg by mouth 2 (two) times daily.    [provider]  celecoxib (CELEBREX) 200 MG capsule Take 200 mg by mouth 2 (two) times daily. 09/22/21   [provider]  Cyanocobalamin (B-12) 2500 MCG TABS Take 1 tablet by mouth daily.    [provider]  cyclobenzaprine (FLEXERIL) 10 MG tablet Take 10 mg by mouth 2 (two) times daily as needed for muscle spasms. 07/14/21   [provider]  doxycycline (VIBRA-TABS) 100 MG tablet Take 100 mg by mouth 2 (two) times daily. For 10 days Patient not taking: Reported on 08/18/2021 08/05/21   [provider]  DULoxetine (CYMBALTA) 30  MG capsule Take 1 capsule (30 mg total) by mouth daily. 06/24/21   Nita Sickle K, DO  folic acid (FOLVITE) 1 MG tablet Take 1 mg by mouth daily. 06/27/21   [provider]  hydroxychloroquine (PLAQUENIL) 200 MG tablet Take 200 mg by mouth at bedtime.    [provider]  methotrexate (RHEUMATREX) 2.5 MG tablet Take 12.5 mg by mouth every Sunday. (5 tablets) 07/19/21   [provider]  ondansetron (ZOFRAN-ODT) 8 MG disintegrating tablet Take 1 tablet (8 mg total) by mouth every 8 (eight) hours as needed for nausea or vomiting. 08/18/21   Linwood Dibbles, MD  pantoprazole (PROTONIX) 40 MG tablet Take 1 tablet (40 mg total) by mouth daily. 08/18/21   Linwood Dibbles, MD  potassium chloride (KLOR-CON) 10 MEQ tablet Take 1 tablet (10 mEq total) by mouth daily for 5 days. 10/05/21 10/10/21  Army Melia A, PA-C  potassium chloride SA (KLOR-CON M) 20 MEQ tablet Take 1 tablet (20 mEq total) by mouth 2 (two) times daily for 7 days. Patient not taking: Reported on 10/05/2021 08/18/21 08/25/21  Linwood Dibbles, MD  pregabalin (LYRICA) 100 MG capsule Take 1 capsule (100 mg total) by mouth 2 (two) times daily. Patient not taking: Reported on 08/18/2021 04/06/21   Nita Sickle K, DO  rizatriptan (MAXALT) 10 MG tablet Take 10 mg by mouth as needed for migraine. Do not exceed  2 tablets in 24 hours    [provider]      Allergies    Nsaids, Carbamazepine, and Lavender oil    Review of Systems   Review of Systems  Physical Exam Updated Vital Signs BP 107/71   Pulse 72   Temp 98.9 F (37.2 C) (Oral)   Resp 18   SpO2 99%  Physical Exam Vitals and nursing note reviewed.  Constitutional:      General: She is not in acute distress.    Appearance: She is well-developed.  HENT:     Head: Normocephalic and atraumatic.  Eyes:     Conjunctiva/sclera: Conjunctivae normal.  Cardiovascular:     Rate and Rhythm: Normal rate and regular rhythm.     Heart sounds: Normal heart sounds. No murmur  heard. Pulmonary:     Effort: Pulmonary effort is normal. No respiratory distress.     Breath sounds: Normal breath sounds.  Abdominal:     Palpations: Abdomen is soft.     Tenderness: There is no abdominal tenderness. There is no guarding.  Musculoskeletal:        General: No swelling.     Cervical back: Neck supple.  Skin:    General: Skin is warm and dry.     Capillary Refill: Capillary refill takes less than 2 seconds.  Neurological:     Mental Status: She is alert.  Psychiatric:        Mood and Affect: Mood normal.     ED Results / Procedures / Treatments   Labs (all labs ordered are listed, but only abnormal results are displayed) Labs Reviewed  CBC WITH DIFFERENTIAL/PLATELET - Abnormal; Notable for the following components:      Result Value   Hemoglobin 16.3 (*)    MCH 35.2 (*)    MCHC 37.0 (*)    Platelets 500 (*)    All other components within normal limits  COMPREHENSIVE METABOLIC PANEL - Abnormal; Notable for the following components:   Sodium 134 (*)    Potassium 2.9 (*)    Chloride 90 (*)    All other components within normal limits  URINALYSIS, ROUTINE W REFLEX MICROSCOPIC - Abnormal; Notable for the following components:   Ketones, ur 20 (*)    All other components within normal limits  LIPASE, BLOOD  I-STAT BETA HCG BLOOD, ED ([facility], [facility], [facility] ONLY)  TROPONIN I (HIGH SENSITIVITY)    EKG EKG Interpretation  Date/Time:  Tuesday November 23 2021 19:48:33 EDT Ventricular Rate:  113 PR Interval:  120 QRS Duration: 96 QT Interval:  378 QTC Calculation: 518 R Axis:   219 Text Interpretation: Sinus tachycardia Right atrial enlargement When compared with ECG of 05-Oct-2021 07:57, PREVIOUS ECG IS PRESENT Confirmed by Virgina Norfolk ([id number]) on 11/23/2021 9:16:24 PM  Radiology DG Chest 1 View  Result Date: 11/23/2021 CLINICAL DATA:  Chest pain EXAM: CHEST  1 VIEW COMPARISON:  CTA chest 08/18/2021 and radiographs 08/18/2021 FINDINGS: No focal consolidation, pleural  effusion, or pneumothorax. Normal cardiomediastinal silhouette. No acute osseous abnormality. IMPRESSION: No active disease. Electronically Signed   By: Minerva Fester M.D.   On: 11/23/2021 20:27    Procedures Procedures    Medications Ordered in ED Medications  sodium chloride 0.9 % bolus 1,000 mL (1,000 mLs Intravenous New Bag/Given 11/23/21 2135)  ondansetron (ZOFRAN) injection 4 mg (4 mg Intravenous Given 11/23/21 2135)  LORazepam (ATIVAN) injection 1 mg (1 mg Intravenous Given 11/23/21 2136)  trimethobenzamide (TIGAN) injection 200 mg (200 mg  Intramuscular Given 11/23/21 2145)    ED Course/ Medical Decision Making/ A&P                           Medical Decision Making Risk Prescription drug management.   [name] is here with nausea and vomiting.  Normal vitals.  No fever.  Differential diagnosis likely hyperemesis from marijuana use, viral process, dehydration, electrolyte abnormality.  EKG shows sinus tachycardia.  Ischemic changes.  She has had blood work prior to my evaluation including CBC, CMP, lipase, troponin, urinalysis.  Per my review and interpretation of labs no significant anemia, electrolyte abnormality, kidney injury, leukocytosis.  Troponin is normal.  Chest x-ray per my review and interpretation shows no pneumonia or pneumothorax.  Overall have no concern for ACS or PE or other acute cardiac or pulmonary process.  Have no concern for appendicitis or other intra-abdominal process.  Gallbladder and liver enzymes within normal limits.  No concern for cholecystitis or hepatitis.  Lipase is normal and doubt pancreatitis.  Patient felt better after IV fluids, antiemetics.  Discharged in good condition.  Recommend abstaining from using marijuana.  This chart was dictated using voice recognition software.  Despite best efforts to proofread,  errors can occur which can change the documentation meaning.         Final Clinical Impression(s) / ED Diagnoses Final  diagnoses:  Nausea and vomiting, unspecified vomiting type    Rx / DC Orders ED Discharge Orders     None         Virgina Norfolk, DO 11/23/21 2157

## 2021-12-05 ENCOUNTER — Emergency Department (HOSPITAL_COMMUNITY)
Admission: EM | Admit: 2021-12-05 | Discharge: 2021-12-06 | Discharge status: Against Medical Advice | Payer: Medicaid Other | Attending: Emergency Medicine | Admitting: Emergency Medicine

## 2021-12-05 ENCOUNTER — Encounter (HOSPITAL_COMMUNITY): Payer: Self-pay

## 2021-12-05 ENCOUNTER — Other Ambulatory Visit: Payer: Self-pay

## 2021-12-05 DIAGNOSIS — M25552 Pain in left hip: Secondary | ICD-10-CM | POA: Diagnosis not present

## 2021-12-05 DIAGNOSIS — R202 Paresthesia of skin: Secondary | ICD-10-CM | POA: Insufficient documentation

## 2021-12-05 DIAGNOSIS — M549 Dorsalgia, unspecified: Secondary | ICD-10-CM | POA: Insufficient documentation

## 2021-12-05 DIAGNOSIS — M545 Low back pain, unspecified: Secondary | ICD-10-CM | POA: Insufficient documentation

## 2021-12-05 DIAGNOSIS — X500XXA Overexertion from strenuous movement or load, initial encounter: Secondary | ICD-10-CM | POA: Insufficient documentation

## 2021-12-05 DIAGNOSIS — Z5321 Procedure and treatment not carried out due to patient leaving prior to being seen by health care provider: Secondary | ICD-10-CM | POA: Insufficient documentation

## 2021-12-05 NOTE — ED Triage Notes (Signed)
Pt has chronic back pain but pain has gotten worse. Pain in lower back and tingling goes to left hip and leg. Pt lifted 50lb bag of food the other day and thinks that may be the cause. Pt ambulatory. No loss of bowel or bladder control

## 2021-12-05 NOTE — ED Provider Triage Note (Signed)
Emergency Medicine Provider Triage Evaluation Note  Laura Montoya , a 36 y.o. female  was evaluated in triage.  Pt complains of left back and hip pain.  Ongoing x years, worse the past few days.  Denies new injuyr/trauma, did recently lift 50lb container of cat litter with daughter.  Some tingling of the leg but no focal numbness.  No incontinence.  Remains ambulatory.  Currently in pain management.  Review of Systems  Positive: Back/hip pain Negative: Incontinence, numbness, weakness  Physical Exam  BP 102/87   Pulse 99   Temp 98.5 F (36.9 C) (Oral)   Ht 5\' 2"  (1.575 m)   Wt 63.5 kg   LMP 11/09/2021   SpO2 98%   BMI 25.61 kg/m  Gen:   Awake, no distress   Resp:  Normal effort  MSK:   Moves extremities without difficulty  Other:  Back atraumatic, ambulatory in triage with steady gait  Medical Decision Making  Medically screening exam initiated at 11:50 PM.  Appropriate orders placed.  11/11/2021 was informed that the remainder of the evaluation will be completed by another provider, this initial triage assessment does not replace that evaluation, and the importance of remaining in the ED until their evaluation is complete.  Acute on chronic left back/hip pain.  No focal deficits appreciated in triage.  Already in pain management for this.   [name], PA-C 12/05/21 2352

## 2021-12-06 NOTE — ED Notes (Signed)
Pt left [facility] due to long wait times.  

## 2021-12-13 ENCOUNTER — Ambulatory Visit: Payer: Medicaid Other | Admitting: Neurology

## 2021-12-13 ENCOUNTER — Other Ambulatory Visit (INDEPENDENT_AMBULATORY_CARE_PROVIDER_SITE_OTHER): Payer: Medicaid Other

## 2021-12-13 ENCOUNTER — Encounter: Payer: Self-pay | Admitting: Neurology

## 2021-12-13 VITALS — BP 107/72 | HR 90 | Ht 63.0 in | Wt 146.0 lb

## 2021-12-13 DIAGNOSIS — G5712 Meralgia paresthetica, left lower limb: Secondary | ICD-10-CM

## 2021-12-13 DIAGNOSIS — E5111 Dry beriberi: Secondary | ICD-10-CM

## 2021-12-13 DIAGNOSIS — R202 Paresthesia of skin: Secondary | ICD-10-CM

## 2021-12-13 LAB — TSH: TSH: 0.39 u[IU]/mL (ref 0.35–5.50)

## 2021-12-13 LAB — B12 AND FOLATE PANEL
Folate: >24.2 ng/mL (ref >5.9)
Vitamin B-12: 862 pg/mL (ref 211–911)

## 2021-12-13 MED ORDER — DULOXETINE HCL 30 MG PO CPEP
30.0000 mg | ORAL_CAPSULE | Freq: Every day | ORAL | 5 refills | Status: DC
Start: 2021-12-13 — End: 2022-06-20

## 2021-12-13 NOTE — Progress Notes (Signed)
Follow-up Visit   Date: 12/13/2021    Bryana Froemming MRN: [id number] DOB: February 24, 1986    Laura Montoya is a 36 y.o.  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  The patient was accompanied to the clinic by daughter who also provides collateral information.    IMPRESSION/PLAN: Nutritional deficiency neuropathy, worsening feet numbness over the past month.  Unclear what has precipitated this change  - Check vitamin B12, folate, vitamin B1, TSH, copper, SPEP with IFE - Consider EMG going forward - For painful paresthesias, continue duloxetine 30mg /d.  Previously tried:  gabapentin and Lyrica  Return to clinic in 6 months  --------------------------------------------- History of present illness: She has history of nutritional deficiency neuropathy (2017) and most symptoms improved after she improved her nutrition and supplemented with vitamins.  Today, she presents with worsening tingling in the hands and numbness in the feet. Symptoms are constant.   She continues to have imbalance, but is able to walk unassisted.  No weakness. She is taking B12, folic acid, biotin, and vitamin D3.  Rare alcohol use.  She is no longer taking Lyrica.   Medications:  Current Outpatient Medications on File Prior to Visit  Medication Sig Dispense Refill   albuterol (PROVENTIL HFA;VENTOLIN HFA) 108 (90 Base) MCG/ACT inhaler Inhale 1-2 puffs into the lungs every 6 (six) hours as needed for wheezing or shortness of breath.     Azelastine HCl 137 MCG/SPRAY SOLN Place 2 sprays into both nostrils 2 (two) times daily as needed for allergies.     buPROPion ER (WELLBUTRIN SR) 100 MG 12 hr tablet Take 100 mg by mouth daily.     busPIRone (BUSPAR) 7.5 MG tablet Take 7.5 mg by mouth 2 (two) times daily.     Cyanocobalamin (B-12) 2500 MCG TABS Take 1 tablet by mouth  daily.     cyclobenzaprine (FLEXERIL) 10 MG tablet Take 10 mg by mouth 2 (two) times daily as needed for muscle spasms.     DULoxetine (CYMBALTA) 30 MG capsule Take 1 capsule (30 mg total) by mouth daily. 30 capsule 5   folic acid (FOLVITE) 1 MG tablet Take 1 mg by mouth daily.     hydroxychloroquine (PLAQUENIL) 200 MG tablet Take 200 mg by mouth at bedtime.     methotrexate (RHEUMATREX) 2.5 MG tablet Take 12.5 mg by mouth every Sunday. (5 tablets)     ondansetron (ZOFRAN-ODT) 8 MG disintegrating tablet Take 1 tablet (8 mg total) by mouth every 8 (eight) hours as needed for nausea or vomiting. 12 tablet 0   potassium chloride (KLOR-CON) 10 MEQ tablet Take 1 tablet (10 mEq total) by mouth daily for 5 days. 5 tablet 0   potassium chloride SA (KLOR-CON M) 20 MEQ tablet Take 1 tablet (20 mEq total) by mouth 2 (two) times daily for 7 days. 14 tablet 0   rizatriptan (MAXALT) 10 MG tablet Take 10 mg by mouth as needed for migraine. Do not exceed 2 tablets in 24 hours     celecoxib (CELEBREX) 200 MG capsule Take 200 mg by mouth 2 (two) times daily. (Patient not taking: Reported on 12/13/2021)     doxycycline (VIBRA-TABS) 100 MG tablet Take 100 mg by mouth 2 (two) times daily. For 10 days (Patient not taking: Reported on 08/18/2021)     pantoprazole (PROTONIX) 40 MG tablet Take 1 tablet (40 mg total) by mouth daily. (Patient not  taking: Reported on 12/13/2021) 14 tablet 0   pregabalin (LYRICA) 100 MG capsule Take 1 capsule (100 mg total) by mouth 2 (two) times daily. (Patient not taking: Reported on 08/18/2021) 60 capsule 5   No current facility-administered medications on file prior to visit.    Allergies:  Allergies  Allergen Reactions   Nsaids Anaphylaxis    4.26.2023 Pt reports that she is currently taking Celebrex   Carbamazepine Itching and Hives   Lavender Oil Rash    Vital Signs:  BP 107/72   Pulse 90   Ht 5\' 3"  (1.6 m)   Wt 146 lb (66.2 kg)   LMP 11/09/2021   SpO2 98%   BMI 25.86 kg/m    Neurological Exam: MENTAL STATUS including orientation to time, place, person, recent and remote memory, attention span and concentration, language, and fund of knowledge is normal.  Speech is not dysarthric.  CRANIAL NERVES:  Pupils equal round and reactive to light.  Normal conjugate, extra-ocular eye movements in all directions of gaze.  No ptosis .  Face is symmetric. Palate elevates symmetrically.  Tongue is midline.  MOTOR:  Motor strength is 5/5 in all extremities, including distally in the feet.  No atrophy, fasciculations or abnormal movements.  No pronator drift.  Tone is normal.    MSRs:  Reflexes are 2+/4 in the arms and absent in the legs.  SENSORY:  Vibration is mildly reduced at the ankles bilaterally, otherwise intact. 11/11/2021  COORDINATION/GAIT:  Normal finger-to- nose-finger.  Intact rapid alternating movements bilaterally.  Gait narrow based and stable.   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  Sensory responses show improvement from prior study on 07/07/2015. A superimposed left carpal tunnel syndrome, cannot be excluded.  Correlate clinically.    Thank you for allowing me to participate in patient's care.  If I can answer any additional questions, I would be pleased to do so.    Sincerely,    Nolia Tschantz K. 07/09/2015, DO

## 2021-12-21 LAB — PROTEIN ELECTROPHORESIS, SERUM
Albumin ELP: 3.6 g/dL — ABNORMAL LOW (ref 3.8–4.8)
Alpha 1: 0.3 g/dL (ref 0.2–0.3)
Alpha 2: 0.5 g/dL (ref 0.5–0.9)
Beta 2: 0.3 g/dL (ref 0.2–0.5)
Beta Globulin: 0.4 g/dL (ref 0.4–0.6)
Gamma Globulin: 0.5 g/dL — ABNORMAL LOW (ref 0.8–1.7)
Total Protein: 5.5 g/dL — ABNORMAL LOW (ref 6.1–8.1)

## 2021-12-21 LAB — VITAMIN B1: Vitamin B1 (Thiamine): <6 nmol/L — ABNORMAL LOW (ref 8–30)

## 2021-12-21 LAB — IMMUNOFIXATION ELECTROPHORESIS
IgG (Immunoglobin G), Serum: 406 mg/dL — ABNORMAL LOW (ref 600–1640)
IgM, Serum: 106 mg/dL (ref 50–300)
Immunofix Electr Int: NOT DETECTED
Immunoglobulin A: 200 mg/dL (ref 47–310)

## 2021-12-21 LAB — COPPER, SERUM: Copper: 81 ug/dL (ref 70–175)

## 2022-03-18 ENCOUNTER — Emergency Department (HOSPITAL_COMMUNITY)
Admission: EM | Admit: 2022-03-18 | Discharge: 2022-03-19 | Disposition: A | Discharge status: Home / Self Care | Payer: Medicaid Other | Attending: Emergency Medicine | Admitting: Emergency Medicine

## 2022-03-18 ENCOUNTER — Other Ambulatory Visit: Payer: Self-pay

## 2022-03-18 DIAGNOSIS — Z720 Tobacco use: Secondary | ICD-10-CM | POA: Diagnosis not present

## 2022-03-18 DIAGNOSIS — G8929 Other chronic pain: Secondary | ICD-10-CM | POA: Diagnosis not present

## 2022-03-18 DIAGNOSIS — R112 Nausea with vomiting, unspecified: Secondary | ICD-10-CM | POA: Insufficient documentation

## 2022-03-18 DIAGNOSIS — R079 Chest pain, unspecified: Secondary | ICD-10-CM | POA: Insufficient documentation

## 2022-03-18 DIAGNOSIS — R1084 Generalized abdominal pain: Secondary | ICD-10-CM | POA: Insufficient documentation

## 2022-03-18 DIAGNOSIS — E876 Hypokalemia: Secondary | ICD-10-CM | POA: Diagnosis not present

## 2022-03-18 DIAGNOSIS — M545 Low back pain, unspecified: Secondary | ICD-10-CM | POA: Insufficient documentation

## 2022-03-18 LAB — COMPREHENSIVE METABOLIC PANEL
ALT: 9 U/L (ref 0–44)
AST: 16 U/L (ref 15–41)
Albumin: 3.8 g/dL (ref 3.5–5.0)
Alkaline Phosphatase: 49 U/L (ref 38–126)
Anion gap: 12 (ref 5–15)
BUN: <5 mg/dL — ABNORMAL LOW (ref 6–20)
CO2: 27 mmol/L (ref 22–32)
Calcium: 9.4 mg/dL (ref 8.9–10.3)
Chloride: 97 mmol/L — ABNORMAL LOW (ref 98–111)
Creatinine, Ser: 0.67 mg/dL (ref 0.44–1.00)
GFR, Estimated: >60 mL/min (ref >60)
Glucose, Bld: 83 mg/dL (ref 70–99)
Potassium: 2.5 mmol/L — CL (ref 3.5–5.1)
Sodium: 136 mmol/L (ref 135–145)
Total Bilirubin: 0.7 mg/dL (ref 0.3–1.2)
Total Protein: 6.3 g/dL — ABNORMAL LOW (ref 6.5–8.1)

## 2022-03-18 LAB — CBC WITH DIFFERENTIAL/PLATELET
Abs Immature Granulocytes: 0.01 10*3/uL (ref 0.00–0.07)
Basophils Absolute: 0.1 10*3/uL (ref 0.0–0.1)
Basophils Relative: 1 %
Eosinophils Absolute: 0.1 10*3/uL (ref 0.0–0.5)
Eosinophils Relative: 1 %
HCT: 44.1 % (ref 36.0–46.0)
Hemoglobin: 15.5 g/dL — ABNORMAL HIGH (ref 12.0–15.0)
Immature Granulocytes: 0 %
Lymphocytes Relative: 42 %
Lymphs Abs: 3.6 10*3/uL (ref 0.7–4.0)
MCH: 33.8 pg (ref 26.0–34.0)
MCHC: 35.1 g/dL (ref 30.0–36.0)
MCV: 96.1 fL (ref 80.0–100.0)
Monocytes Absolute: 0.5 10*3/uL (ref 0.1–1.0)
Monocytes Relative: 6 %
Neutro Abs: 4.4 10*3/uL (ref 1.7–7.7)
Neutrophils Relative %: 50 %
Platelets: 452 10*3/uL — ABNORMAL HIGH (ref 150–400)
RBC: 4.59 MIL/uL (ref 3.87–5.11)
RDW: 12.6 % (ref 11.5–15.5)
WBC: 8.6 10*3/uL (ref 4.0–10.5)
nRBC: 0 % (ref 0.0–0.2)

## 2022-03-18 LAB — URINALYSIS, ROUTINE W REFLEX MICROSCOPIC
Bilirubin Urine: NEGATIVE
Glucose, UA: NEGATIVE mg/dL
Ketones, ur: NEGATIVE mg/dL
Leukocytes,Ua: NEGATIVE
Nitrite: NEGATIVE
Protein, ur: NEGATIVE mg/dL
Specific Gravity, Urine: 1.003 — ABNORMAL LOW (ref 1.005–1.030)
pH: 7 (ref 5.0–8.0)

## 2022-03-18 LAB — I-STAT BETA HCG BLOOD, ED (MC, WL, AP ONLY): I-stat hCG, quantitative: 120.9 m[IU]/mL — ABNORMAL HIGH (ref <5)

## 2022-03-18 LAB — HCG, QUANTITATIVE, PREGNANCY: hCG, Beta Chain, Quant, S: <1 m[IU]/mL (ref <5)

## 2022-03-18 LAB — LIPASE, BLOOD: Lipase: 27 U/L (ref 11–51)

## 2022-03-18 MED ORDER — POTASSIUM CHLORIDE CRYS ER 20 MEQ PO TBCR
40.0000 meq | EXTENDED_RELEASE_TABLET | Freq: Once | ORAL | Status: AC
  Administered 2022-03-18: 40 meq via ORAL
  Filled 2022-03-18: qty 2

## 2022-03-18 MED ORDER — ONDANSETRON HCL 4 MG/2ML IJ SOLN
4.0000 mg | Freq: Once | INTRAMUSCULAR | Status: AC
Start: 2022-03-19 — End: 2022-03-19
  Administered 2022-03-19: 4 mg via INTRAVENOUS
  Filled 2022-03-18: qty 2

## 2022-03-18 MED ORDER — MORPHINE SULFATE (PF) 2 MG/ML IV SOLN
2.0000 mg | Freq: Once | INTRAVENOUS | Status: AC
  Administered 2022-03-19: 2 mg via INTRAVENOUS
  Filled 2022-03-18: qty 1

## 2022-03-18 MED ORDER — POTASSIUM CHLORIDE 10 MEQ/100ML IV SOLN
10.0000 meq | INTRAVENOUS | Status: AC
  Administered 2022-03-19 (×3): 10 meq via INTRAVENOUS
  Filled 2022-03-18 (×3): qty 100

## 2022-03-18 MED ORDER — SODIUM CHLORIDE 0.9 % IV BOLUS
1000.0000 mL | Freq: Once | INTRAVENOUS | Status: AC
  Administered 2022-03-19: 1000 mL via INTRAVENOUS

## 2022-03-18 NOTE — ED Triage Notes (Signed)
Pt presents with LUQ pain and has had vomiting until Sunday.  Pt states vomiting resolved but pain is continuing.  Pt states she had a hx of cyclical vomiting but has stopped smoking marijuana and was doing better.

## 2022-03-18 NOTE — ED Provider Notes (Signed)
[facility] EMERGENCY DEPARTMENT Provider Note   CSN: [id number] Arrival date & time: 03/18/22  1823     History  Chief Complaint  Patient presents with   Abdominal Pain    Laura Montoya is a 36 y.o. female with past medical history significant for schizophrenia, tobacco use, acid reflux, previous cyclical vomiting syndrome who presents with concern for abdominal pain, nausea, vomiting for the last several days, reports that last episode of significant vomiting was on Sunday but her abdominal pain persists.  She also endorses some occasional chest pain but reports it is not persistent or present at time of my current evaluation.  She denies shortness of breath, but does endorse ongoing nausea at this time.  She denies fever, chills.  Patient reports that her symptoms started after she had a nerve ablation for chronic back pain few days ago.  Additionally she endorses some left-sided neck pain without any numbness, tingling, urinary or fecal incontinence.  She denies previous history of intra-abdominal surgeries.   Abdominal Pain      Home Medications Prior to Admission medications   Medication Sig Start Date End Date Taking? Authorizing Provider  albuterol (PROVENTIL HFA;VENTOLIN HFA) 108 (90 Base) MCG/ACT inhaler Inhale 1-2 puffs into the lungs every 6 (six) hours as needed for wheezing or shortness of breath.    [provider]  Azelastine HCl 137 MCG/SPRAY SOLN Place 2 sprays into both nostrils 2 (two) times daily as needed for allergies. 04/17/21   [provider]  buPROPion ER (WELLBUTRIN SR) 100 MG 12 hr tablet Take 100 mg by mouth daily. 06/28/21   [provider]  busPIRone (BUSPAR) 7.5 MG tablet Take 7.5 mg by mouth 2 (two) times daily.    [provider]  celecoxib (CELEBREX) 200 MG capsule Take 200 mg by mouth 2 (two) times daily. Patient not taking: Reported on 12/13/2021 09/22/21   [provider]   Cyanocobalamin (B-12) 2500 MCG TABS Take 1 tablet by mouth daily.    [provider]  cyclobenzaprine (FLEXERIL) 10 MG tablet Take 10 mg by mouth 2 (two) times daily as needed for muscle spasms. 07/14/21   [provider]  doxycycline (VIBRA-TABS) 100 MG tablet Take 100 mg by mouth 2 (two) times daily. For 10 days Patient not taking: Reported on 08/18/2021 08/05/21   [provider]  DULoxetine (CYMBALTA) 30 MG capsule Take 1 capsule (30 mg total) by mouth daily. 12/13/21   Narda Amber K, DO  folic acid (FOLVITE) 1 MG tablet Take 1 mg by mouth daily. 06/27/21   [provider]  hydroxychloroquine (PLAQUENIL) 200 MG tablet Take 200 mg by mouth at bedtime.    [provider]  methotrexate (RHEUMATREX) 2.5 MG tablet Take 12.5 mg by mouth every Sunday. (5 tablets) 07/19/21   [provider]  ondansetron (ZOFRAN-ODT) 8 MG disintegrating tablet Take 1 tablet (8 mg total) by mouth every 8 (eight) hours as needed for nausea or vomiting. 08/18/21   Dorie Rank, MD  pantoprazole (PROTONIX) 40 MG tablet Take 1 tablet (40 mg total) by mouth daily. Patient not taking: Reported on 12/13/2021 08/18/21   Dorie Rank, MD  potassium chloride (KLOR-CON) 10 MEQ tablet Take 1 tablet (10 mEq total) by mouth daily for 5 days. 10/05/21 12/13/21  Suella Broad A, PA-C  potassium chloride SA (KLOR-CON M) 20 MEQ tablet Take 1 tablet (20 mEq total) by mouth 2 (two) times daily for 7 days. 08/18/21 12/13/21  Dorie Rank,  MD  pregabalin (LYRICA) 100 MG capsule Take 1 capsule (100 mg total) by mouth 2 (two) times daily. Patient not taking: Reported on 08/18/2021 04/06/21   Nita Sickle K, DO  rizatriptan (MAXALT) 10 MG tablet Take 10 mg by mouth as needed for migraine. Do not exceed 2 tablets in 24 hours    [provider]      Allergies    Nsaids, Carbamazepine, and Lavender oil    Review of Systems   Review of Systems  Gastrointestinal:  Positive for abdominal pain.  All  other systems reviewed and are negative.   Physical Exam Updated Vital Signs BP 106/72   Pulse 74   Temp (!) 97.5 F (36.4 C)   Resp 18   SpO2 98%  Physical Exam Vitals and nursing note reviewed.  Constitutional:      General: She is not in acute distress.    Appearance: Normal appearance.  HENT:     Head: Normocephalic and atraumatic.  Eyes:     General:        Right eye: No discharge.        Left eye: No discharge.  Cardiovascular:     Rate and Rhythm: Normal rate and regular rhythm.     Heart sounds: No murmur heard.    No friction rub. No gallop.  Pulmonary:     Effort: Pulmonary effort is normal.     Breath sounds: Normal breath sounds.  Abdominal:     General: Bowel sounds are normal.     Palpations: Abdomen is soft.     Comments: Focal tenderness to palpation in the epigastric region, left upper quadrant, no rebound, rigidity, guarding, normal bowel sounds throughout.  Skin:    General: Skin is warm and dry.     Capillary Refill: Capillary refill takes less than 2 seconds.  Neurological:     Mental Status: She is alert and oriented to person, place, and time.  Psychiatric:        Mood and Affect: Mood normal.        Behavior: Behavior normal.     ED Results / Procedures / Treatments   Labs (all labs ordered are listed, but only abnormal results are displayed) Labs Reviewed  CBC WITH DIFFERENTIAL/PLATELET - Abnormal; Notable for the following components:      Result Value   Hemoglobin 15.5 (*)    Platelets 452 (*)    All other components within normal limits  COMPREHENSIVE METABOLIC PANEL - Abnormal; Notable for the following components:   Potassium 2.5 (*)    Chloride 97 (*)    BUN <5 (*)    Total Protein 6.3 (*)    All other components within normal limits  URINALYSIS, ROUTINE W REFLEX MICROSCOPIC - Abnormal; Notable for the following components:   APPearance HAZY (*)    Specific Gravity, Urine 1.003 (*)    Hgb urine dipstick MODERATE (*)     Bacteria, UA RARE (*)    All other components within normal limits  I-STAT BETA HCG BLOOD, ED ([facility], [facility], [facility] ONLY) - Abnormal; Notable for the following components:   I-stat hCG, quantitative 120.9 (*)    All other components within normal limits  LIPASE, BLOOD  HCG, QUANTITATIVE, PREGNANCY  BASIC METABOLIC PANEL  TROPONIN I (HIGH SENSITIVITY)    EKG EKG Interpretation  Date/Time:  Saturday March 19 2022 00:43:48 EST Ventricular Rate:  97 PR Interval:  136 QRS Duration: 98 QT Interval:  362 QTC Calculation: 459 R  Axis:   133 Text Interpretation: Normal sinus rhythm Possible Left atrial enlargement Right axis deviation Incomplete right bundle branch block Cannot rule out Anterior infarct , age undetermined Abnormal ECG When compared with ECG of 23-Nov-2021 19:48, Incomplete right bundle branch block is now present Confirmed by Delora Fuel ([id number]) on 03/19/2022 12:46:41 AM  Radiology No results found.  Procedures Procedures    Medications Ordered in ED Medications  potassium chloride SA (KLOR-CON M) CR tablet 40 mEq (40 mEq Oral Given 03/18/22 2324)  sodium chloride 0.9 % bolus 1,000 mL (0 mLs Intravenous Stopped 03/19/22 0420)  potassium chloride 10 mEq in 100 mL IVPB (0 mEq Intravenous Stopped 03/19/22 0420)  morphine (PF) 2 MG/ML injection 2 mg (2 mg Intravenous Given 03/19/22 0052)  ondansetron (ZOFRAN) injection 4 mg (4 mg Intravenous Given 03/19/22 0052)    ED Course/ Medical Decision Making/ A&P                           Medical Decision Making Amount and/or Complexity of Data Reviewed Labs: ordered. Radiology: ordered.  Risk Prescription drug management.   This patient is a 36 y.o. female who presents to the ED for concern of epigastric abdominal pain, nausea, vomiting, this involves an extensive number of treatment options, and is a complaint that carries with it a high risk of complications and morbidity. The emergent differential diagnosis prior to  evaluation includes, but is not limited to,  esophagitis, gastritis, peptic ulcer disease, esophageal rupture, gastric rupture, Boerhaave's, Mallory-Weiss, pancreatitis, cholecystitis, cholangitis, acute mesenteric ischemia, atypical chest pain or ACS, lower lobar pneumonia, return of patient's cyclic vomiting syndrome, vs other  .   This is not an exhaustive differential.   Past Medical History / Co-morbidities / Social History: schizophrenia, tobacco use, acid reflux, previous cyclical vomiting syndrome  Additional history: Chart reviewed. Pertinent results include: Reviewed lab work, imaging from previous emergency department visits  Physical Exam: Physical exam performed. The pertinent findings include: Patient with some tenderness in the epigastric region without rebound, rigidity, guarding, her vital signs are overall stable, exam, although she was fairly tachycardic on arrival, heart rate is improved on reassessment.  Lab Tests: I ordered, and personally interpreted labs.  The pertinent results include: CBC notable for mildly elevated hemoglobin and platelets suggestive of moderate hemoconcentration, UA with moderate hemoglobin initial i-STAT beta-hCG is elevated, however quantitative hCG is normal.  CMP is notable for significant hypokalemia, potassium 2.5.  Potassium repleted both orally and with IV runs.  Repeat pending at this time.   Imaging Studies: Initially as patient has not had recent imaging, had significant abdominal pain on exam ordered CT abdomen pelvis, however on reassessment she reports that she feels improved and would like to forego CT imaging at this time.   Cardiac Monitoring:  The patient was maintained on a cardiac monitor.  My attending physician Dr. Roxanne Mins viewed and interpreted the cardiac monitored which showed an underlying rhythm of: NSR, new incomplete RBBB compared to previous EKG . I agree with this interpretation.   Medications: I ordered medication  including potassium, morphine, zofran  for nausea, pain, . Reevaluation of the patient after these medicines showed that the patient improved. I have reviewed the patients home medicines and have made adjustments as needed.  6:46 AM Care of [name] transferred to PA Theodis Blaze and Dr. Tamera Punt at the end of my shift as the patient will require reassessment once labs/imaging have resulted. Patient  presentation, ED course, and plan of care discussed with review of all pertinent labs and imaging. Please see his/her note for further details regarding further ED course and disposition. Plan at time of handoff is as long as potassium is uptrending patient updated at home with oral potassium repletion, and close PCP follow-up.. This may be altered or completely changed at the discretion of the oncoming team pending results of further workup.   Final Clinical Impression(s) / ED Diagnoses Final diagnoses:  Generalized abdominal pain  Nausea and vomiting, unspecified vomiting type  Hypokalemia    Rx / DC Orders ED Discharge Orders          Ordered    Basic metabolic panel  Status:  Canceled        03/19/22 0329              Anselmo Pickler, PA-C 03/19/22 [id number]    Fatima Blank, MD 03/19/22 1912

## 2022-03-18 NOTE — ED Provider Triage Note (Signed)
Emergency Medicine Provider Triage Evaluation Note  Glorine Hanratty , a 36 y.o. female  was evaluated in triage.  Pt complains of abdominal pain, nausea and vomiting.  Patient had an ablation on her back 1 week ago.  After that procedure she had 2 days of vomiting and abdominal pain.  Vomiting has improved however abdominal pain has persisted.  Worse left upper quadrant, less so left lower quadrant.  No fevers.  No diarrhea or constipation.  She has tried diet Dramamine with some relief.  History of cyclic vomiting that improved after she quit smoking marijuana.  She avoids NSAIDs.  No alcohol use.  Review of Systems  Positive: Abdominal pain Negative: Diarrhea  Physical Exam  BP 108/89 (BP Location: Left Arm)   Pulse (!) 110   Temp 99.2 F (37.3 C) (Oral)   Resp 20   SpO2 100%  Gen:   Awake, no distress   Resp:  Normal effort  MSK:   Moves extremities without difficulty  Other:  Left upper quadrant tenderness to palpation, no rebound or guarding, tachycardia  Medical Decision Making  Medically screening exam initiated at 7:05 PM.  Appropriate orders placed.  [name] was informed that the remainder of the evaluation will be completed by another provider, this initial triage assessment does not replace that evaluation, and the importance of remaining in the ED until their evaluation is complete.    Renne Crigler, PA-C 03/18/22 1906

## 2022-03-19 ENCOUNTER — Emergency Department (HOSPITAL_COMMUNITY): Payer: Medicaid Other

## 2022-03-19 LAB — BASIC METABOLIC PANEL
Anion gap: 10 (ref 5–15)
BUN: <5 mg/dL — ABNORMAL LOW (ref 6–20)
CO2: 22 mmol/L (ref 22–32)
Calcium: 8.7 mg/dL — ABNORMAL LOW (ref 8.9–10.3)
Chloride: 105 mmol/L (ref 98–111)
Creatinine, Ser: 0.66 mg/dL (ref 0.44–1.00)
GFR, Estimated: >60 mL/min (ref >60)
Glucose, Bld: 86 mg/dL (ref 70–99)
Potassium: 3.7 mmol/L (ref 3.5–5.1)
Sodium: 137 mmol/L (ref 135–145)

## 2022-03-19 LAB — TROPONIN I (HIGH SENSITIVITY): Troponin I (High Sensitivity): 3 ng/L (ref <18)

## 2022-03-19 NOTE — Discharge Instructions (Addendum)
Please take 20 mg, potassium chloride twice daily for 15 of the 99 mg over-the-counter tablets daily, and follow-up with your PCP to recheck your potassium.  Please return if you have worsening abdominal pain, nausea.

## 2022-03-19 NOTE — Care Plan (Signed)
0110 needs new IV for CT, IV in [facility] is not threaded and will not work for CT injection, tried starting a new IV x4 without success

## 2022-03-19 NOTE — ED Provider Notes (Signed)
Care of patient handed off to me by Luther Hearing, PA-C at change of shift.  Briefly, 36 year old female with a history of schizophrenia, GERD, cyclic vomiting syndrome who presents to the emergency department with nausea and vomiting over the past several days. Physical Exam  BP 106/72   Pulse 74   Temp (!) 97.5 F (36.4 C)   Resp 18   SpO2 98%   Physical Exam Vitals and nursing note reviewed.  HENT:     Head: Normocephalic and atraumatic.  Eyes:     General: No scleral icterus. Pulmonary:     Effort: Pulmonary effort is normal. No respiratory distress.  Skin:    Findings: No rash.  Neurological:     General: No focal deficit present.     Mental Status: She is alert.  Psychiatric:        Mood and Affect: Mood normal.        Behavior: Behavior normal.        Thought Content: Thought content normal.        Judgment: Judgment normal.    Procedures  Procedures  ED Course / MDM    Medical Decision Making Amount and/or Complexity of Data Reviewed Labs: ordered.  Risk Prescription drug management.   Workup here included abdominal labs which showed no leukocytosis.  Not pregnant.  UA with no ketones.  Her CMP did result with a potassium of 2.5.  Because of that she had 1 dose of oral potassium and 3 runs of 10 mEq of IV potassium as well as IV fluids.  She was given morphine and Zofran with improvement in her symptoms.  At handoff, pending repeat BMP to ensure that her potassium is improving.  The patient is also taking oral potassium at home already.  If the potassium is improving can discharge home meds.    8469: Repeated BMP shows potassium normalized at 3.7.  Patient has instructions on how to take her home potassium.  She is alert and oriented and ambulatory.  Will discharge.     Cristopher Peru, PA-C 03/19/22 0700    [name], MD 03/20/22 [phone]

## 2022-04-04 ENCOUNTER — Emergency Department (HOSPITAL_COMMUNITY): Payer: Medicaid Other

## 2022-04-04 ENCOUNTER — Encounter (HOSPITAL_COMMUNITY): Payer: Self-pay

## 2022-04-04 ENCOUNTER — Emergency Department (HOSPITAL_COMMUNITY)
Admission: EM | Admit: 2022-04-04 | Discharge: 2022-04-05 | Discharge status: Against Medical Advice | Payer: Medicaid Other | Attending: Emergency Medicine | Admitting: Emergency Medicine

## 2022-04-04 ENCOUNTER — Other Ambulatory Visit: Payer: Self-pay

## 2022-04-04 DIAGNOSIS — Z5321 Procedure and treatment not carried out due to patient leaving prior to being seen by health care provider: Secondary | ICD-10-CM | POA: Diagnosis not present

## 2022-04-04 DIAGNOSIS — M25512 Pain in left shoulder: Secondary | ICD-10-CM | POA: Insufficient documentation

## 2022-04-04 DIAGNOSIS — R109 Unspecified abdominal pain: Secondary | ICD-10-CM | POA: Insufficient documentation

## 2022-04-04 DIAGNOSIS — R112 Nausea with vomiting, unspecified: Secondary | ICD-10-CM | POA: Insufficient documentation

## 2022-04-04 LAB — COMPREHENSIVE METABOLIC PANEL
ALT: 6 U/L (ref 0–44)
AST: 19 U/L (ref 15–41)
Albumin: 3.7 g/dL (ref 3.5–5.0)
Alkaline Phosphatase: 61 U/L (ref 38–126)
Anion gap: 14 (ref 5–15)
BUN: <5 mg/dL — ABNORMAL LOW (ref 6–20)
CO2: 27 mmol/L (ref 22–32)
Calcium: 9.7 mg/dL (ref 8.9–10.3)
Chloride: 98 mmol/L (ref 98–111)
Creatinine, Ser: 0.66 mg/dL (ref 0.44–1.00)
GFR, Estimated: >60 mL/min (ref >60)
Glucose, Bld: 94 mg/dL (ref 70–99)
Potassium: 4.5 mmol/L (ref 3.5–5.1)
Sodium: 139 mmol/L (ref 135–145)
Total Bilirubin: 0.8 mg/dL (ref 0.3–1.2)
Total Protein: 6.2 g/dL — ABNORMAL LOW (ref 6.5–8.1)

## 2022-04-04 LAB — CBC WITH DIFFERENTIAL/PLATELET
Abs Immature Granulocytes: 0.02 10*3/uL (ref 0.00–0.07)
Basophils Absolute: 0 10*3/uL (ref 0.0–0.1)
Basophils Relative: 0 %
Eosinophils Absolute: 0.1 10*3/uL (ref 0.0–0.5)
Eosinophils Relative: 1 %
HCT: 42.7 % (ref 36.0–46.0)
Hemoglobin: 14.8 g/dL (ref 12.0–15.0)
Immature Granulocytes: 0 %
Lymphocytes Relative: 27 %
Lymphs Abs: 2.6 10*3/uL (ref 0.7–4.0)
MCH: 34.3 pg — ABNORMAL HIGH (ref 26.0–34.0)
MCHC: 34.7 g/dL (ref 30.0–36.0)
MCV: 98.8 fL (ref 80.0–100.0)
Monocytes Absolute: 0.5 10*3/uL (ref 0.1–1.0)
Monocytes Relative: 6 %
Neutro Abs: 6.4 10*3/uL (ref 1.7–7.7)
Neutrophils Relative %: 66 %
Platelets: 502 10*3/uL — ABNORMAL HIGH (ref 150–400)
RBC: 4.32 MIL/uL (ref 3.87–5.11)
RDW: 13 % (ref 11.5–15.5)
WBC: 9.7 10*3/uL (ref 4.0–10.5)
nRBC: 0 % (ref 0.0–0.2)

## 2022-04-04 LAB — URINALYSIS, ROUTINE W REFLEX MICROSCOPIC
Bilirubin Urine: NEGATIVE
Glucose, UA: NEGATIVE mg/dL
Hgb urine dipstick: NEGATIVE
Ketones, ur: NEGATIVE mg/dL
Leukocytes,Ua: NEGATIVE
Nitrite: NEGATIVE
Protein, ur: NEGATIVE mg/dL
Specific Gravity, Urine: 1.01 (ref 1.005–1.030)
pH: 8 (ref 5.0–8.0)

## 2022-04-04 LAB — LIPASE, BLOOD: Lipase: 27 U/L (ref 11–51)

## 2022-04-04 MED ORDER — SODIUM CHLORIDE 0.9 % IV BOLUS: 1000.0000 mL | Freq: Once | INTRAVENOUS | Status: DC

## 2022-04-04 NOTE — ED Provider Triage Note (Signed)
Emergency Medicine Provider Triage Evaluation Note  Laura Montoya , a 36 y.o. female  was evaluated in triage.  Pt complains of left sided abdominal pain and N/V since 2AM today. No fever, chills. Reports marijuana use. No blood in vomit. Has had similar episodes before. Also complaint of L shoulder painx1 week, has been using marijuana to cope with pain.  Review of Systems  Positive: N/V, abdominal pain Negative: Chest pain  Physical Exam  BP 123/89   Pulse (!) 122   Temp 98.6 F (37 C)   Resp 12   SpO2 97%  Gen:   Awake, no distress   Resp:  Normal effort  MSK:   Moves extremities without difficulty   Medical Decision Making  Medically screening exam initiated at 6:39 PM.  Appropriate orders placed.  Laura Montoya was informed that the remainder of the evaluation will be completed by another provider, this initial triage assessment does not replace that evaluation, and the importance of remaining in the ED until their evaluation is complete.     Laura Montoya, [place] 04/04/22 1842

## 2022-04-04 NOTE — ED Triage Notes (Signed)
Pt came in via POV d/t abd pain d/t n/v that has been going on since this morning. Denies any fevers, endorses smoking mariajuana for Lt sided shoulder pain. A/Ox4, tachycardic upon arrival, Does have Hx of tachycardia.

## 2022-04-05 NOTE — ED Notes (Signed)
Pt called 3x  times with no response

## 2022-06-20 ENCOUNTER — Encounter: Payer: Self-pay | Admitting: Neurology

## 2022-06-20 ENCOUNTER — Ambulatory Visit: Payer: Medicaid Other | Admitting: Neurology

## 2022-06-20 VITALS — BP 101/68 | HR 69 | Ht 62.0 in | Wt 129.0 lb

## 2022-06-20 DIAGNOSIS — E5111 Dry beriberi: Secondary | ICD-10-CM | POA: Diagnosis not present

## 2022-06-20 MED ORDER — DULOXETINE HCL 30 MG PO CPEP: 30.0000 mg | ORAL_CAPSULE | Freq: Every day | ORAL | 3 refills | Status: DC

## 2022-06-20 NOTE — Patient Instructions (Signed)
Return to clinic in 1 year.

## 2022-06-20 NOTE — Progress Notes (Signed)
Follow-up Visit   Date: 06/20/2022    Alissha Shires MRN: [id number] DOB: Mar 28, 1986    Laura Montoya is a 36 y.o.  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  The patient was accompanied to the clinic by daughter who also provides collateral information.    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  Today, she presents with worsening tingling in the hands and numbness in the feet. Symptoms are constant.   She continues to have imbalance, but is able to walk unassisted.  No weakness. She is taking [id number], folic acid, biotin, and vitamin D3.  Rare alcohol use.  She is no longer taking Lyrica.   UPDATE 06/20/2022:  She is here for 6 month follow-up.  She continues to have numbness in the feet and lower legs.  No weakness, falls, or burning/stabbing pain.  Her thiamine level was undetectable at her last visit and she has restarted thiamine supplements.  She was taking it more regularly but no takes it once per week.  No new complaints.  She is feeling well.   Medications:  Current Outpatient Medications on File Prior to Visit  Medication Sig Dispense Refill   albuterol (PROVENTIL HFA;VENTOLIN HFA) 108 (90 Base) MCG/ACT inhaler Inhale 1-2 puffs into the lungs every 6 (six) hours as needed for wheezing or shortness of breath.     Azelastine HCl 137 MCG/SPRAY SOLN Place 2 sprays into both nostrils 2 (two) times  daily as needed for allergies.     buPROPion ER (WELLBUTRIN SR) 100 MG 12 hr tablet Take 100 mg by mouth daily.     busPIRone (BUSPAR) 7.5 MG tablet Take 7.5 mg by mouth 2 (two) times daily.     Cyanocobalamin (B-12) 2500 MCG TABS Take 1 tablet by mouth daily.     cyclobenzaprine (FLEXERIL) 10 MG tablet Take 10 mg by mouth 2 (two) times daily as needed for muscle spasms.     DULoxetine (CYMBALTA) 30 MG capsule Take 1 capsule (30 mg total) by mouth daily. 30 capsule 5   folic acid (FOLVITE) 1 MG tablet Take 1 mg by mouth daily.     hydroxychloroquine (PLAQUENIL) 200 MG tablet Take 200 mg by mouth at bedtime.     methotrexate (RHEUMATREX) 2.5 MG tablet Take 12.5 mg by mouth every Sunday. (5 tablets)     celecoxib (CELEBREX) 200 MG capsule Take 200 mg by mouth 2 (two) times daily. (Patient not taking: Reported on 12/13/2021)     doxycycline (VIBRA-TABS) 100 MG tablet Take 100 mg by mouth 2 (two) times daily. For 10 days (Patient not taking: Reported on 08/18/2021)     ondansetron (ZOFRAN-ODT) 8 MG disintegrating tablet Take 1 tablet (8 mg total) by mouth every 8 (eight) hours as needed for nausea or vomiting. 12 tablet 0   pantoprazole (PROTONIX) 40 MG tablet Take 1 tablet (40 mg total) by mouth daily. (Patient not taking: Reported on 12/13/2021) 14 tablet 0   potassium chloride (KLOR-CON) 10 MEQ tablet Take 1 tablet (10 mEq total) by mouth daily  for 5 days. 5 tablet 0   potassium chloride SA (KLOR-CON M) 20 MEQ tablet Take 1 tablet (20 mEq total) by mouth 2 (two) times daily for 7 days. 14 tablet 0   pregabalin (LYRICA) 100 MG capsule Take 1 capsule (100 mg total) by mouth 2 (two) times daily. (Patient not taking: Reported on 08/18/2021) 60 capsule 5   rizatriptan (MAXALT) 10 MG tablet Take 10 mg by mouth as needed for migraine. Do not exceed 2 tablets in 24 hours (Patient not taking: Reported on 06/20/2022)     No current facility-administered medications on file prior to visit.    Allergies:   Allergies  Allergen Reactions   Nsaids Anaphylaxis    4.26.2023 Pt reports that she is currently taking Celebrex   Carbamazepine Itching and Hives   Lavender Oil Rash    Vital Signs:  BP 101/68   Pulse 69   Ht '5\' 2"'$  (1.575 m)   Wt 129 lb (58.5 kg)   SpO2 100%   BMI 23.59 kg/m   Neurological Exam: MENTAL STATUS including orientation to time, place, person, recent and remote memory, attention span and concentration, language, and fund of knowledge is normal.  Speech is not dysarthric.  CRANIAL NERVES:  Pupils equal round and reactive to light.  Normal conjugate, extra-ocular eye movements in all directions of gaze.  No ptosis .  Face is symmetric. Palate elevates symmetrically.  Tongue is midline.  MOTOR:  Motor strength is 5/5 in all extremities, including distally in the feet.  No atrophy, fasciculations or abnormal movements.  No pronator drift.  Tone is normal.    MSRs:  Reflexes are 2+/4 in the arms and absent in the legs.  SENSORY:  Vibration is mildly reduced at the ankles bilaterally, otherwise intact. Marland Kitchen  COORDINATION/GAIT:  Normal finger-to- nose-finger.  Intact rapid alternating movements bilaterally.  Gait narrow based and stable.  Stressed gait intact.  Unsteady with tandem gait, but able to perform.   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  Sensory responses show improvement from prior study on 07/07/2015. A superimposed left carpal tunnel syndrome, cannot be excluded.  Correlate clinically.   Labs 12/13/2021:  vitamin B12 862, folate > 24, B1 <6, TSH 0.39, copper 81, SPEP with IFE no M protein  Thank you for allowing me to participate in patient's care.  If I can answer any additional questions, I would be pleased to do so.    Sincerely,    Jillana Selph K. Posey Pronto, DO

## 2022-08-31 ENCOUNTER — Emergency Department (HOSPITAL_COMMUNITY)
Admission: EM | Admit: 2022-08-31 | Discharge: 2022-08-31 | Disposition: A | Discharge status: Home / Self Care | Payer: Medicaid Other | Attending: Emergency Medicine | Admitting: Emergency Medicine

## 2022-08-31 ENCOUNTER — Emergency Department (HOSPITAL_COMMUNITY): Payer: Medicaid Other

## 2022-08-31 ENCOUNTER — Other Ambulatory Visit: Payer: Self-pay

## 2022-08-31 ENCOUNTER — Encounter (HOSPITAL_COMMUNITY): Payer: Self-pay | Admitting: Emergency Medicine

## 2022-08-31 DIAGNOSIS — J45909 Unspecified asthma, uncomplicated: Secondary | ICD-10-CM | POA: Diagnosis not present

## 2022-08-31 DIAGNOSIS — M25561 Pain in right knee: Secondary | ICD-10-CM | POA: Diagnosis not present

## 2022-08-31 DIAGNOSIS — M25551 Pain in right hip: Secondary | ICD-10-CM | POA: Diagnosis not present

## 2022-08-31 DIAGNOSIS — M2550 Pain in unspecified joint: Secondary | ICD-10-CM

## 2022-08-31 DIAGNOSIS — M542 Cervicalgia: Secondary | ICD-10-CM | POA: Insufficient documentation

## 2022-08-31 DIAGNOSIS — M25532 Pain in left wrist: Secondary | ICD-10-CM | POA: Diagnosis not present

## 2022-08-31 LAB — BASIC METABOLIC PANEL
Anion gap: 9 (ref 5–15)
BUN: 5 mg/dL — ABNORMAL LOW (ref 6–20)
CO2: 25 mmol/L (ref 22–32)
Calcium: 8.7 mg/dL — ABNORMAL LOW (ref 8.9–10.3)
Chloride: 101 mmol/L (ref 98–111)
Creatinine, Ser: 0.8 mg/dL (ref 0.44–1.00)
GFR, Estimated: >60 mL/min (ref >60)
Glucose, Bld: 96 mg/dL (ref 70–99)
Potassium: 3.8 mmol/L (ref 3.5–5.1)
Sodium: 135 mmol/L (ref 135–145)

## 2022-08-31 LAB — CBC
HCT: 40 % (ref 36.0–46.0)
Hemoglobin: 13.2 g/dL (ref 12.0–15.0)
MCH: 33.5 pg (ref 26.0–34.0)
MCHC: 33 g/dL (ref 30.0–36.0)
MCV: 101.5 fL — ABNORMAL HIGH (ref 80.0–100.0)
Platelets: 387 10*3/uL (ref 150–400)
RBC: 3.94 MIL/uL (ref 3.87–5.11)
RDW: 13.2 % (ref 11.5–15.5)
WBC: 8 10*3/uL (ref 4.0–10.5)
nRBC: 0 % (ref 0.0–0.2)

## 2022-08-31 LAB — C-REACTIVE PROTEIN: CRP: <0.5 mg/dL (ref <1.0)

## 2022-08-31 LAB — SEDIMENTATION RATE: Sed Rate: 9 mm/hr (ref 0–22)

## 2022-08-31 MED ORDER — BUSPIRONE HCL 7.5 MG PO TABS: 7.5000 mg | ORAL_TABLET | Freq: Two times a day (BID) | ORAL | 0 refills | Status: DC

## 2022-08-31 MED ORDER — OXYCODONE-ACETAMINOPHEN 5-325 MG PO TABS
1.0000 | ORAL_TABLET | Freq: Once | ORAL | Status: AC
  Administered 2022-08-31: 1 via ORAL
  Filled 2022-08-31: qty 1

## 2022-08-31 MED ORDER — BUPROPION HCL ER (SR) 100 MG PO TB12: 100.0000 mg | ORAL_TABLET | Freq: Every day | ORAL | 0 refills | Status: DC

## 2022-08-31 MED ORDER — ORPHENADRINE CITRATE ER 100 MG PO TB12: 100.0000 mg | ORAL_TABLET | Freq: Two times a day (BID) | ORAL | 0 refills | Status: AC

## 2022-08-31 MED ORDER — BACLOFEN 10 MG PO TABS
10.0000 mg | ORAL_TABLET | Freq: Once | ORAL | Status: AC
  Administered 2022-08-31: 10 mg via ORAL
  Filled 2022-08-31: qty 1

## 2022-08-31 MED ORDER — ORPHENADRINE CITRATE 30 MG/ML IJ SOLN: 60.0000 mg | Freq: Once | INTRAMUSCULAR | Status: DC

## 2022-08-31 NOTE — ED Triage Notes (Signed)
Pt in with overall joint pain. Has RA, and is reporting worsened pain and swelling to L wrist. Also states she is having bad R neck, R shoulder and R hip pain. Takes Plaquinil and Methotrexate daily, doesn't see her rheumatologist until the end of the month

## 2022-08-31 NOTE — ED Provider Triage Note (Signed)
Emergency Medicine Provider Triage Evaluation Note  Laura Montoya , a 37 y.o. female  was evaluated in triage.  Pt complains of generalized pain all over, worse in left wrist, right shoulder, right neck.  Patient with history of rheumatoid arthritis, reports this feels like a flare.  Patient reports that she is not scheduled to see her rheumatologist to the end of the month.  She reports that she has been taking her Plaquenil, methotrexate as directed.  She has been taking naproxen, mobic without relief.  Review of Systems  Positive: arthritis Negative: Fever, chills  Physical Exam  BP 93/67   Pulse 94   Temp (!) 97.3 F (36.3 C) (Oral)   Resp 16   Wt 58.5 kg   LMP 08/24/2022   SpO2 98%   BMI 23.59 kg/m  Gen:   Awake, no distress   Resp:  Normal effort  MSK:   Moves extremities without difficulty  Other:  Ttp on left wrist, neck, without stepoff, deformity, significant pain with passive and active ROM  Medical Decision Making  Medically screening exam initiated at 7:50 PM.  Appropriate orders placed.  Laura Montoya was informed that the remainder of the evaluation will be completed by another provider, this initial triage assessment does not replace that evaluation, and the importance of remaining in the ED until their evaluation is complete.  Workup initiated in triage    Laura Montoya 08/31/22 1954

## 2022-08-31 NOTE — Discharge Instructions (Addendum)
Take Norflex as prescribed. Do not drive if taking Norflex.  Follow up with your PCP.

## 2022-08-31 NOTE — ED Provider Notes (Signed)
[place] EMERGENCY DEPARTMENT AT [facility] Provider Note   CSN: [id number] Arrival date & time: 08/31/22  1920     History  Chief Complaint  Patient presents with   Joint Pain    Laura Montoya is a 37 y.o. female.  37 year old female with past medical history of RA, asthma, scoliosis, schizophrenia, mood disorder, anxiety, GERD, schizophrenia and bipolar presents with complaint of joint pains.  Notes that she has had pain in her neck, notes swelling to right side of neck and to her right shoulder as well as left wrist, right hip, right knee pain.  No falls or injuries.  Is taking Aleve (NSAID allergy with report of anaphylaxis however has tolerated Aleve and meloxicam), Plaquenil, methotrexate without relief of her pain.  Denies rashes, redness, swelling of joints.  Feels like she is having an RA flare.  Was provided with Percocet in triage with some relief of pain.  Has tried Flexeril and Robaxin without pain relief. Also out of several of her medications, pending new provider for mental health rx.        Home Medications Prior to Admission medications   Medication Sig Start Date End Date Taking? Authorizing Provider  orphenadrine (NORFLEX) 100 MG tablet Take 1 tablet (100 mg total) by mouth 2 (two) times daily for 10 days. 08/31/22 09/10/22 Yes Jeannie Fend, PA-C  albuterol (PROVENTIL HFA;VENTOLIN HFA) 108 (90 Base) MCG/ACT inhaler Inhale 1-2 puffs into the lungs every 6 (six) hours as needed for wheezing or shortness of breath.    [provider]  Azelastine HCl 137 MCG/SPRAY SOLN Place 2 sprays into both nostrils 2 (two) times daily as needed for allergies. 04/17/21   [provider]  buPROPion ER (WELLBUTRIN SR) 100 MG 12 hr tablet Take 1 tablet (100 mg total) by mouth daily. 08/31/22 09/30/22  Jeannie Fend, PA-C  busPIRone (BUSPAR) 7.5 MG tablet Take 1 tablet (7.5 mg total) by mouth 2 (two) times daily. 08/31/22   Jeannie Fend, PA-C   Cyanocobalamin (B-12) 2500 MCG TABS Take 1 tablet by mouth daily.    [provider]  cyclobenzaprine (FLEXERIL) 10 MG tablet Take 10 mg by mouth 2 (two) times daily as needed for muscle spasms. 07/14/21   [provider]  DULoxetine (CYMBALTA) 30 MG capsule Take 1 capsule (30 mg total) by mouth daily. 06/20/22   Nita Sickle K, DO  folic acid (FOLVITE) 1 MG tablet Take 1 mg by mouth daily. 06/27/21   [provider]  hydroxychloroquine (PLAQUENIL) 200 MG tablet Take 200 mg by mouth at bedtime.    [provider]  methotrexate (RHEUMATREX) 2.5 MG tablet Take 12.5 mg by mouth every Sunday. (5 tablets) 07/19/21   [provider]  metoprolol succinate (TOPROL-XL) 25 MG 24 hr tablet Take 1 tablet by mouth daily. 05/10/22   [provider]  ondansetron (ZOFRAN-ODT) 8 MG disintegrating tablet Take 1 tablet (8 mg total) by mouth every 8 (eight) hours as needed for nausea or vomiting. 08/18/21   Linwood Dibbles, MD  potassium chloride SA (KLOR-CON M) 20 MEQ tablet Take 1 tablet (20 mEq total) by mouth 2 (two) times daily for 7 days. 08/18/21 12/13/21  Linwood Dibbles, MD      Allergies    Nsaids, Carbamazepine, and Lavender oil    Review of Systems   Review of Systems Negative except as per HPI Physical Exam Updated Vital Signs BP (!) 92/52   Pulse (!) 57  Temp 98.2 F (36.8 C) (Oral)   Resp 18   Wt 58.5 kg   LMP 08/24/2022   SpO2 100%   BMI 23.59 kg/m  Physical Exam Vitals and nursing note reviewed.  Constitutional:      General: She is not in acute distress.    Appearance: She is well-developed. She is not diaphoretic.  HENT:     Head: Normocephalic and atraumatic.  Pulmonary:     Effort: Pulmonary effort is normal.  Musculoskeletal:        General: Tenderness present. No swelling or deformity.     Right wrist: Normal.     Left wrist: Swelling and tenderness present. No deformity, effusion or crepitus. Normal range of motion. Normal pulse.        Arms:     Right hip: No crepitus. Normal range of motion. Normal strength.     Left hip: No crepitus. Normal range of motion. Normal strength.     Right knee: No effusion, erythema, bony tenderness or crepitus. Normal range of motion.     Comments: Diffuse back pain, more so to right trapezius area  Skin:    General: Skin is warm and dry.     Findings: No erythema or rash.  Neurological:     Mental Status: She is alert and oriented to person, place, and time.     Motor: No weakness.  Psychiatric:        Behavior: Behavior normal.     ED Results / Procedures / Treatments   Labs (all labs ordered are listed, but only abnormal results are displayed) Labs Reviewed  CBC - Abnormal; Notable for the following components:      Result Value   MCV 101.5 (*)    All other components within normal limits  BASIC METABOLIC PANEL - Abnormal; Notable for the following components:   BUN 5 (*)    Calcium 8.7 (*)    All other components within normal limits  SEDIMENTATION RATE  C-REACTIVE PROTEIN    EKG None  Radiology CT Cervical Spine Wo Contrast  Result Date: 08/31/2022 CLINICAL DATA:  Neck pain, acute, no red flags History of rheumatoid arthritis with right neck, shoulder, hip and left wrist pain. EXAM: CT CERVICAL SPINE WITHOUT CONTRAST TECHNIQUE: Multidetector CT imaging of the cervical spine was performed without intravenous contrast. Multiplanar CT image reconstructions were also generated. RADIATION DOSE REDUCTION: This exam was performed according to the departmental dose-optimization program which includes automated exposure control, adjustment of the mA and/or kV according to patient size and/or use of iterative reconstruction technique. COMPARISON:  Radiograph 08/29/2022 FINDINGS: Alignment: Normal. Skull base and vertebrae: No acute fracture. No primary bone lesion or focal pathologic process. Soft tissues and spinal canal: No prevertebral fluid or swelling. No visible canal  hematoma. Disc levels: Slight non-compressive disc bulges at C4-C5 and C5-C6. No spinal canal stenosis. No bony neural foraminal stenosis. Upper chest: Nonacute. Other: None. IMPRESSION: 1. No fracture or subluxation. 2. Slight non-compressive disc bulges at C4-C5 and C5-C6. No spinal canal or bony neural foraminal stenosis. Electronically Signed   By: Narda Rutherford M.D.   On: 08/31/2022 21:23   DG Wrist Complete Left  Result Date: 08/31/2022 CLINICAL DATA:  Joint pain.  History rheumatoid arthritis. EXAM: LEFT WRIST - COMPLETE 3+ VIEW COMPARISON:  None Available. FINDINGS: There is no evidence of fracture or dislocation. There is no evidence of arthropathy or other focal bone abnormality. Soft tissues are unremarkable. IMPRESSION: Negative. Electronically Signed   By:  Kennith Center M.D.   On: 08/31/2022 20:54    Procedures Procedures    Medications Ordered in ED Medications  baclofen (LIORESAL) tablet 10 mg (has no administration in time range)  oxyCODONE-acetaminophen (PERCOCET/ROXICET) 5-325 MG per tablet 1 tablet (1 tablet Oral Given 08/31/22 2008)    ED Course/ Medical Decision Making/ A&P                             Medical Decision Making Risk Prescription drug management.   37 year old female with complaint of acute on chronic joint pains.  On exam he does have mild swelling of the left wrist, she has a wrist brace she can wear for support, no redness of the joints, normal range of motion.  X-ray of the left wrist as ordered interpreted as negative for acute bony abnormality, agree with radiology interpretation.  CT C-spine obtained in triage for complaint of neck pain and swelling, chronic changes, no acute abnormality, agree with radiologist interpretation.  Labs are reassuring including BMP, CMP, sed rate and CRP that are within normal notes.  Some pain relief with Percocet provided in triage, will add baclofen, sent prescription for Norflex to her pharmacy and advised to  follow-up with her primary care for recheck.  Provided with refills of some of her medications which she is out of as she awaits new mental health prescriber.  Stable for discharge with plan for outpatient follow-up.        Final Clinical Impression(s) / ED Diagnoses Final diagnoses:  Polyarthralgia    Rx / DC Orders ED Discharge Orders          Ordered    buPROPion ER [facility] SR) 100 MG 12 hr tablet  Daily        08/31/22 2250    busPIRone (BUSPAR) 7.5 MG tablet  2 times daily        08/31/22 2250    orphenadrine (NORFLEX) 100 MG tablet  2 times daily        08/31/22 2311              Jeannie Fend, PA-C 08/31/22 2321    Maia Plan, MD 09/03/22 0111

## 2022-09-09 ENCOUNTER — Other Ambulatory Visit: Payer: Self-pay

## 2022-09-09 ENCOUNTER — Emergency Department (HOSPITAL_COMMUNITY): Payer: Medicaid Other

## 2022-09-09 ENCOUNTER — Encounter (HOSPITAL_COMMUNITY): Payer: Self-pay | Admitting: Emergency Medicine

## 2022-09-09 ENCOUNTER — Emergency Department (HOSPITAL_COMMUNITY)
Admission: EM | Admit: 2022-09-09 | Discharge: 2022-09-10 | Disposition: A | Discharge status: Home / Self Care | Payer: Medicaid Other | Attending: Emergency Medicine | Admitting: Emergency Medicine

## 2022-09-09 DIAGNOSIS — R112 Nausea with vomiting, unspecified: Secondary | ICD-10-CM | POA: Diagnosis not present

## 2022-09-09 DIAGNOSIS — E860 Dehydration: Secondary | ICD-10-CM | POA: Diagnosis not present

## 2022-09-09 DIAGNOSIS — Z79899 Other long term (current) drug therapy: Secondary | ICD-10-CM | POA: Insufficient documentation

## 2022-09-09 DIAGNOSIS — E876 Hypokalemia: Secondary | ICD-10-CM | POA: Diagnosis not present

## 2022-09-09 DIAGNOSIS — J45909 Unspecified asthma, uncomplicated: Secondary | ICD-10-CM | POA: Insufficient documentation

## 2022-09-09 DIAGNOSIS — D72829 Elevated white blood cell count, unspecified: Secondary | ICD-10-CM | POA: Diagnosis not present

## 2022-09-09 DIAGNOSIS — R1084 Generalized abdominal pain: Secondary | ICD-10-CM | POA: Diagnosis present

## 2022-09-09 LAB — COMPREHENSIVE METABOLIC PANEL
ALT: 12 U/L (ref 0–44)
AST: 18 U/L (ref 15–41)
Albumin: 4 g/dL (ref 3.5–5.0)
Alkaline Phosphatase: 77 U/L (ref 38–126)
Anion gap: 13 (ref 5–15)
BUN: 13 mg/dL (ref 6–20)
CO2: 28 mmol/L (ref 22–32)
Calcium: 9.3 mg/dL (ref 8.9–10.3)
Chloride: 93 mmol/L — ABNORMAL LOW (ref 98–111)
Creatinine, Ser: 0.87 mg/dL (ref 0.44–1.00)
GFR, Estimated: >60 mL/min (ref >60)
Glucose, Bld: 109 mg/dL — ABNORMAL HIGH (ref 70–99)
Potassium: 2.7 mmol/L — CL (ref 3.5–5.1)
Sodium: 134 mmol/L — ABNORMAL LOW (ref 135–145)
Total Bilirubin: 0.8 mg/dL (ref 0.3–1.2)
Total Protein: 7 g/dL (ref 6.5–8.1)

## 2022-09-09 LAB — URINALYSIS, ROUTINE W REFLEX MICROSCOPIC
Bilirubin Urine: NEGATIVE
Glucose, UA: NEGATIVE mg/dL
Hgb urine dipstick: NEGATIVE
Ketones, ur: 20 mg/dL — AB
Leukocytes,Ua: NEGATIVE
Nitrite: NEGATIVE
Protein, ur: NEGATIVE mg/dL
Specific Gravity, Urine: 1.011 (ref 1.005–1.030)
pH: 6 (ref 5.0–8.0)

## 2022-09-09 LAB — I-STAT CHEM 8, ED
BUN: 14 mg/dL (ref 6–20)
Calcium, Ion: 1.12 mmol/L — ABNORMAL LOW (ref 1.15–1.40)
Chloride: 90 mmol/L — ABNORMAL LOW (ref 98–111)
Creatinine, Ser: 0.7 mg/dL (ref 0.44–1.00)
Glucose, Bld: 104 mg/dL — ABNORMAL HIGH (ref 70–99)
HCT: 44 % (ref 36.0–46.0)
Hemoglobin: 15 g/dL (ref 12.0–15.0)
Potassium: 2.7 mmol/L — CL (ref 3.5–5.1)
Sodium: 133 mmol/L — ABNORMAL LOW (ref 135–145)
TCO2: 29 mmol/L (ref 22–32)

## 2022-09-09 LAB — CBC WITH DIFFERENTIAL/PLATELET
Abs Immature Granulocytes: 0.03 10*3/uL (ref 0.00–0.07)
Basophils Absolute: 0.1 10*3/uL (ref 0.0–0.1)
Basophils Relative: 1 %
Eosinophils Absolute: 0 10*3/uL (ref 0.0–0.5)
Eosinophils Relative: 0 %
HCT: 42 % (ref 36.0–46.0)
Hemoglobin: 14.5 g/dL (ref 12.0–15.0)
Immature Granulocytes: 0 %
Lymphocytes Relative: 33 %
Lymphs Abs: 3.7 10*3/uL (ref 0.7–4.0)
MCH: 33.2 pg (ref 26.0–34.0)
MCHC: 34.5 g/dL (ref 30.0–36.0)
MCV: 96.1 fL (ref 80.0–100.0)
Monocytes Absolute: 0.7 10*3/uL (ref 0.1–1.0)
Monocytes Relative: 6 %
Neutro Abs: 6.7 10*3/uL (ref 1.7–7.7)
Neutrophils Relative %: 60 %
Platelets: 407 10*3/uL — ABNORMAL HIGH (ref 150–400)
RBC: 4.37 MIL/uL (ref 3.87–5.11)
RDW: 13.4 % (ref 11.5–15.5)
WBC: 11.1 10*3/uL — ABNORMAL HIGH (ref 4.0–10.5)
nRBC: 0 % (ref 0.0–0.2)

## 2022-09-09 LAB — HCG, QUANTITATIVE, PREGNANCY: hCG, Beta Chain, Quant, S: <1 m[IU]/mL (ref <5)

## 2022-09-09 LAB — LIPASE, BLOOD: Lipase: 28 U/L (ref 11–51)

## 2022-09-09 LAB — I-STAT BETA HCG BLOOD, ED (MC, WL, AP ONLY): I-stat hCG, quantitative: 108.9 m[IU]/mL — ABNORMAL HIGH (ref <5)

## 2022-09-09 MED ORDER — IOHEXOL 350 MG/ML SOLN
75.0000 mL | Freq: Once | INTRAVENOUS | Status: AC | PRN
  Administered 2022-09-09: 75 mL via INTRAVENOUS

## 2022-09-09 MED ORDER — SODIUM CHLORIDE 0.9 % IV BOLUS
1000.0000 mL | Freq: Once | INTRAVENOUS | Status: AC
  Administered 2022-09-09: 1000 mL via INTRAVENOUS

## 2022-09-09 MED ORDER — POTASSIUM CHLORIDE CRYS ER 20 MEQ PO TBCR
40.0000 meq | EXTENDED_RELEASE_TABLET | Freq: Once | ORAL | Status: AC
  Administered 2022-09-09: 40 meq via ORAL
  Filled 2022-09-09: qty 2

## 2022-09-09 NOTE — ED Provider Triage Note (Signed)
Emergency Medicine Provider Triage Evaluation Note  Laura Montoya , a 37 y.o. female  was evaluated in triage.  Pt complains of abdominal pain, vomiting since Wednesday night.  Unable to keep any food or drink down.  Unable to take her medications.  Denies any history of abdominal surgeries..  Review of Systems  Positive: As above Negative: As above  Physical Exam  BP (!) 132/95   Pulse (!) 135   Temp 99.5 F (37.5 C)   Resp 16   Ht 5\' 2"  (1.575 m)   Wt 58.5 kg   LMP 08/24/2022   SpO2 96%   BMI 23.59 kg/m  Gen:   Awake, no distress   Resp:  Normal effort  MSK:   Moves extremities without difficulty  Other:  Abdomen soft and without distention.  Generalized tenderness to palpation present.  Medical Decision Making  Medically screening exam initiated at 9:04 PM.  Appropriate orders placed.  [name] was informed that the remainder of the evaluation will be completed by another provider, this initial triage assessment does not replace that evaluation, and the importance of remaining in the ED until their evaluation is complete.     Marita Kansas, PA-C 09/09/22 2105

## 2022-09-09 NOTE — ED Triage Notes (Signed)
Pt c/o mid abdominal pain associated with headache, nausea, and vomiting that started Wednesday night.  Notable HR in triage 135. States she hasn't been able to keep down her medications, including her metoprolol.

## 2022-09-09 NOTE — ED Provider Notes (Incomplete)
[place] EMERGENCY DEPARTMENT AT [facility] Provider Note   CSN: [id number] Arrival date & time: 09/09/22  2043     History {Add pertinent medical, surgical, social history, OB history to HPI:1} Chief Complaint  Patient presents with  . Abdominal Pain    Laura Montoya is a 37 y.o. female.  HPI     Home Medications Prior to Admission medications   Medication Sig Start Date End Date Taking? Authorizing Provider  albuterol (PROVENTIL HFA;VENTOLIN HFA) 108 (90 Base) MCG/ACT inhaler Inhale 1-2 puffs into the lungs every 6 (six) hours as needed for wheezing or shortness of breath.    [provider]  Azelastine HCl 137 MCG/SPRAY SOLN Place 2 sprays into both nostrils 2 (two) times daily as needed for allergies. 04/17/21   [provider]  buPROPion ER (WELLBUTRIN SR) 100 MG 12 hr tablet Take 1 tablet (100 mg total) by mouth daily. 08/31/22 09/30/22  Jeannie Fend, PA-C  busPIRone (BUSPAR) 7.5 MG tablet Take 1 tablet (7.5 mg total) by mouth 2 (two) times daily. 08/31/22   Jeannie Fend, PA-C  Cyanocobalamin (B-12) 2500 MCG TABS Take 1 tablet by mouth daily.    [provider]  cyclobenzaprine (FLEXERIL) 10 MG tablet Take 10 mg by mouth 2 (two) times daily as needed for muscle spasms. 07/14/21   [provider]  DULoxetine (CYMBALTA) 30 MG capsule Take 1 capsule (30 mg total) by mouth daily. 06/20/22   Nita Sickle K, DO  folic acid (FOLVITE) 1 MG tablet Take 1 mg by mouth daily. 06/27/21   [provider]  hydroxychloroquine (PLAQUENIL) 200 MG tablet Take 200 mg by mouth at bedtime.    [provider]  methotrexate (RHEUMATREX) 2.5 MG tablet Take 12.5 mg by mouth every Sunday. (5 tablets) 07/19/21   [provider]  metoprolol succinate (TOPROL-XL) 25 MG 24 hr tablet Take 1 tablet by mouth daily. 05/10/22   [provider]  ondansetron (ZOFRAN-ODT) 8 MG disintegrating tablet Take 1 tablet (8 mg total) by  mouth every 8 (eight) hours as needed for nausea or vomiting. 08/18/21   Linwood Dibbles, MD  orphenadrine (NORFLEX) 100 MG tablet Take 1 tablet (100 mg total) by mouth 2 (two) times daily for 10 days. 08/31/22 09/10/22  Army Melia A, PA-C  potassium chloride SA (KLOR-CON M) 20 MEQ tablet Take 1 tablet (20 mEq total) by mouth 2 (two) times daily for 7 days. 08/18/21 12/13/21  Linwood Dibbles, MD      Allergies    Nsaids, Carbamazepine, and Lavender oil    Review of Systems   Review of Systems  Physical Exam Updated Vital Signs BP (!) 132/95   Pulse (!) 135   Temp 99.5 F (37.5 C)   Resp 16   Ht 5\' 2"  (1.575 m)   Wt 58.5 kg   LMP 08/24/2022   SpO2 96%   BMI 23.59 kg/m  Physical Exam  ED Results / Procedures / Treatments   Labs (all labs ordered are listed, but only abnormal results are displayed) Labs Reviewed  CBC WITH DIFFERENTIAL/PLATELET - Abnormal; Notable for the following components:      Result Value   WBC 11.1 (*)    Platelets 407 (*)    All other components within normal limits  COMPREHENSIVE METABOLIC PANEL - Abnormal; Notable for the following components:   Sodium 134 (*)    Potassium 2.7 (*)    Chloride 93 (*)    Glucose, Bld 109 (*)  All other components within normal limits  URINALYSIS, ROUTINE W REFLEX MICROSCOPIC - Abnormal; Notable for the following components:   Ketones, ur 20 (*)    All other components within normal limits  I-STAT BETA HCG BLOOD, ED ([facility], [facility], [facility] ONLY) - Abnormal; Notable for the following components:   I-stat hCG, quantitative 108.9 (*)    All other components within normal limits  I-STAT CHEM 8, ED - Abnormal; Notable for the following components:   Sodium 133 (*)    Potassium 2.7 (*)    Chloride 90 (*)    Glucose, Bld 104 (*)    Calcium, Ion 1.12 (*)    All other components within normal limits  LIPASE, BLOOD  HCG, QUANTITATIVE, PREGNANCY    EKG None  Radiology CT ABDOMEN PELVIS W CONTRAST  Result Date: 09/09/2022 CLINICAL  DATA:  Abdominal pain, acute, nonlocalized, vomiting EXAM: CT ABDOMEN AND PELVIS WITH CONTRAST TECHNIQUE: Multidetector CT imaging of the abdomen and pelvis was performed using the standard protocol following bolus administration of intravenous contrast. RADIATION DOSE REDUCTION: This exam was performed according to the departmental dose-optimization program which includes automated exposure control, adjustment of the mA and/or kV according to patient size and/or use of iterative reconstruction technique. CONTRAST:  75mL OMNIPAQUE IOHEXOL 350 MG/ML SOLN COMPARISON:  08/18/2021 FINDINGS: Lower chest: No acute abnormality. Hepatobiliary: No focal liver abnormality is seen. No gallstones, gallbladder wall thickening, or biliary dilatation. Pancreas: Unremarkable Spleen: Unremarkable Adrenals/Urinary Tract: Adrenal glands are unremarkable. Kidneys are normal, without renal calculi, focal lesion, or hydronephrosis. Bladder is unremarkable. Stomach/Bowel: Stomach is within normal limits. Appendix appears normal. No evidence of bowel wall thickening, distention, or inflammatory changes. Vascular/Lymphatic: No significant vascular findings are present. No enlarged abdominal or pelvic lymph nodes. Reproductive: 2 simple cysts are seen within the right ovary measuring up to 2.9 cm, likely representing follicular cysts. No follow-up imaging is recommended for these lesions. The pelvic organs are otherwise unremarkable. Other: No abdominal wall hernia or abnormality. No abdominopelvic ascites. Musculoskeletal: No acute or significant osseous findings. IMPRESSION: 1. No acute intra-abdominal pathology identified. No definite radiographic explanation for the patient's reported symptoms. Electronically Signed   By: Helyn Numbers M.D.   On: 09/09/2022 21:53    Procedures Procedures  {Document cardiac monitor, telemetry assessment procedure when appropriate:1}  Medications Ordered in ED Medications  sodium chloride 0.9 %  bolus 1,000 mL (has no administration in time range)  potassium chloride SA (KLOR-CON M) CR tablet 40 mEq (has no administration in time range)  iohexol (OMNIPAQUE) 350 MG/ML injection 75 mL (75 mLs Intravenous Contrast Given 09/09/22 2137)    ED Course/ Medical Decision Making/ A&P   {   Click here for ABCD2, HEART and other calculatorsREFRESH Note before signing :1}                          Medical Decision Making Amount and/or Complexity of Data Reviewed Labs: ordered.  Risk Prescription drug management.   ***  {Document critical care time when appropriate:1} {Document review of labs and clinical decision tools ie heart score, Chads2Vasc2 etc:1}  {Document your independent review of radiology images, and any outside records:1} {Document your discussion with family members, caretakers, and with consultants:1} {Document social determinants of health affecting pt's care:1} {Document your decision making why or why not admission, treatments were needed:1} Final Clinical Impression(s) / ED Diagnoses Final diagnoses:  None    Rx / DC Orders ED Discharge Orders  None

## 2022-09-09 NOTE — ED Notes (Signed)
Amjad, PA notified of chem-8 results.

## 2022-09-09 NOTE — ED Provider Notes (Signed)
[place] EMERGENCY DEPARTMENT AT [facility] Provider Note   CSN: [id number] Arrival date & time: 09/09/22  2043     History  Chief Complaint  Patient presents with   Abdominal Pain    Laura Montoya is a 37 y.o. female.  37 year old female with past medical history of asthma, schizophrenia, anxiety, GERD,'s bipolar disorder presents with complaint of abdominal pain with nausea and vomiting for the past 3 days.  Notes that she is been unable to keep down any of her medications including her metoprolol.  States that vomiting has since stopped however she continues to feel dehydrated.  No fevers, no chills, no changes in bowel or bladder habits.  No prior abdominal surgeries.       Home Medications Prior to Admission medications   Medication Sig Start Date End Date Taking? Authorizing Provider  potassium chloride SA (KLOR-CON M) 20 MEQ tablet Take 1 tablet (20 mEq total) by mouth 2 (two) times daily. 09/10/22  Yes Jeannie Fend, PA-C  albuterol (PROVENTIL HFA;VENTOLIN HFA) 108 (90 Base) MCG/ACT inhaler Inhale 1-2 puffs into the lungs every 6 (six) hours as needed for wheezing or shortness of breath.    [provider]  Azelastine HCl 137 MCG/SPRAY SOLN Place 2 sprays into both nostrils 2 (two) times daily as needed for allergies. 04/17/21   [provider]  buPROPion ER (WELLBUTRIN SR) 100 MG 12 hr tablet Take 1 tablet (100 mg total) by mouth daily. 08/31/22 09/30/22  Jeannie Fend, PA-C  busPIRone (BUSPAR) 7.5 MG tablet Take 1 tablet (7.5 mg total) by mouth 2 (two) times daily. 08/31/22   Jeannie Fend, PA-C  Cyanocobalamin (B-12) 2500 MCG TABS Take 1 tablet by mouth daily.    [provider]  cyclobenzaprine (FLEXERIL) 10 MG tablet Take 10 mg by mouth 2 (two) times daily as needed for muscle spasms. 07/14/21   [provider]  DULoxetine (CYMBALTA) 30 MG capsule Take 1 capsule (30 mg total) by mouth daily. 06/20/22   Nita Sickle K,  DO  folic acid (FOLVITE) 1 MG tablet Take 1 mg by mouth daily. 06/27/21   [provider]  hydroxychloroquine (PLAQUENIL) 200 MG tablet Take 200 mg by mouth at bedtime.    [provider]  methotrexate (RHEUMATREX) 2.5 MG tablet Take 12.5 mg by mouth every Sunday. (5 tablets) 07/19/21   [provider]  metoprolol succinate (TOPROL-XL) 25 MG 24 hr tablet Take 1 tablet by mouth daily. 05/10/22   [provider]  ondansetron (ZOFRAN-ODT) 8 MG disintegrating tablet Take 1 tablet (8 mg total) by mouth every 8 (eight) hours as needed for nausea or vomiting. 08/18/21   Linwood Dibbles, MD  orphenadrine (NORFLEX) 100 MG tablet Take 1 tablet (100 mg total) by mouth 2 (two) times daily for 10 days. 08/31/22 09/10/22  Jeannie Fend, PA-C      Allergies    Nsaids, Carbamazepine, and Lavender oil    Review of Systems   Review of Systems Negative except as per HPI Physical Exam Updated Vital Signs BP 111/75 (BP Location: Right Arm)   Pulse 71   Temp 98.5 F (36.9 C)   Resp 14   Ht 5\' 2"  (1.575 m)   Wt 58.5 kg   LMP 08/24/2022   SpO2 98%   BMI 23.59 kg/m  Physical Exam Vitals and nursing note reviewed.  Constitutional:      General: She is not in acute distress.    Appearance: She  is well-developed. She is not diaphoretic.  HENT:     Head: Normocephalic and atraumatic.  Cardiovascular:     Rate and Rhythm: Normal rate and regular rhythm.     Heart sounds: Normal heart sounds.  Pulmonary:     Effort: Pulmonary effort is normal.     Breath sounds: Normal breath sounds.  Abdominal:     Palpations: Abdomen is soft.     Tenderness: There is generalized abdominal tenderness.  Skin:    General: Skin is warm and dry.     Findings: No erythema or rash.  Neurological:     Mental Status: She is alert and oriented to person, place, and time.  Psychiatric:        Behavior: Behavior normal.     ED Results / Procedures / Treatments   Labs (all labs ordered are  listed, but only abnormal results are displayed) Labs Reviewed  CBC WITH DIFFERENTIAL/PLATELET - Abnormal; Notable for the following components:      Result Value   WBC 11.1 (*)    Platelets 407 (*)    All other components within normal limits  COMPREHENSIVE METABOLIC PANEL - Abnormal; Notable for the following components:   Sodium 134 (*)    Potassium 2.7 (*)    Chloride 93 (*)    Glucose, Bld 109 (*)    All other components within normal limits  URINALYSIS, ROUTINE W REFLEX MICROSCOPIC - Abnormal; Notable for the following components:   Ketones, ur 20 (*)    All other components within normal limits  I-STAT BETA HCG BLOOD, ED ([facility], [facility], [facility] ONLY) - Abnormal; Notable for the following components:   I-stat hCG, quantitative 108.9 (*)    All other components within normal limits  I-STAT CHEM 8, ED - Abnormal; Notable for the following components:   Sodium 133 (*)    Potassium 2.7 (*)    Chloride 90 (*)    Glucose, Bld 104 (*)    Calcium, Ion 1.12 (*)    All other components within normal limits  LIPASE, BLOOD  HCG, QUANTITATIVE, PREGNANCY    EKG None  Radiology CT ABDOMEN PELVIS W CONTRAST  Result Date: 09/09/2022 CLINICAL DATA:  Abdominal pain, acute, nonlocalized, vomiting EXAM: CT ABDOMEN AND PELVIS WITH CONTRAST TECHNIQUE: Multidetector CT imaging of the abdomen and pelvis was performed using the standard protocol following bolus administration of intravenous contrast. RADIATION DOSE REDUCTION: This exam was performed according to the departmental dose-optimization program which includes automated exposure control, adjustment of the mA and/or kV according to patient size and/or use of iterative reconstruction technique. CONTRAST:  75mL OMNIPAQUE IOHEXOL 350 MG/ML SOLN COMPARISON:  08/18/2021 FINDINGS: Lower chest: No acute abnormality. Hepatobiliary: No focal liver abnormality is seen. No gallstones, gallbladder wall thickening, or biliary dilatation. Pancreas: Unremarkable  Spleen: Unremarkable Adrenals/Urinary Tract: Adrenal glands are unremarkable. Kidneys are normal, without renal calculi, focal lesion, or hydronephrosis. Bladder is unremarkable. Stomach/Bowel: Stomach is within normal limits. Appendix appears normal. No evidence of bowel wall thickening, distention, or inflammatory changes. Vascular/Lymphatic: No significant vascular findings are present. No enlarged abdominal or pelvic lymph nodes. Reproductive: 2 simple cysts are seen within the right ovary measuring up to 2.9 cm, likely representing follicular cysts. No follow-up imaging is recommended for these lesions. The pelvic organs are otherwise unremarkable. Other: No abdominal wall hernia or abnormality. No abdominopelvic ascites. Musculoskeletal: No acute or significant osseous findings. IMPRESSION: 1. No acute intra-abdominal pathology identified. No definite radiographic explanation for the patient's reported symptoms. Electronically  Signed   By: Helyn Numbers M.D.   On: 09/09/2022 21:53    Procedures Procedures    Medications Ordered in ED Medications  iohexol (OMNIPAQUE) 350 MG/ML injection 75 mL (75 mLs Intravenous Contrast Given 09/09/22 2137)  sodium chloride 0.9 % bolus 1,000 mL (1,000 mLs Intravenous New Bag/Given 09/09/22 2328)  potassium chloride SA (KLOR-CON M) CR tablet 40 mEq (40 mEq Oral Given 09/09/22 2327)    ED Course/ Medical Decision Making/ A&P                             Medical Decision Making Amount and/or Complexity of Data Reviewed Labs: ordered.  Risk Prescription drug management.   This patient presents to the ED for concern of n/v, abdominal pain, this involves an extensive number of treatment options, and is a complaint that carries with it a high risk of complications and morbidity.  The differential diagnosis includes but not limited to appendicitis, acute cholecystitis, gastritis   Co morbidities that complicate the patient evaluation  As  above   Additional history obtained:  External records from outside source obtained and reviewed including prior labs for comparison.  Prior EKGs   Lab Tests:  I Ordered, and personally interpreted labs.  The pertinent results include: Initial i-STAT hCG is elevated at 108.9.  Repeat formal quantitative is negative.  Lipase is within normal notes.  Urinalysis with small amount of ketones.  CMP with hypokalemia with potassium of 2.7, no corresponding EKG changes.  CBC with mild leukocytosis at 11.1 without change in differential.   Imaging Studies ordered:  I ordered imaging studies including CT abdomen pelvis I independently visualized and interpreted imaging which showed no acute abnormality I agree with the radiologist interpretation   Cardiac Monitoring: / EKG:  The patient was maintained on a cardiac monitor.  I personally viewed and interpreted the cardiac monitored which showed an underlying rhythm of: Sinus rhythm, rate 70    Problem List / ED Course / Critical interventions / Medication management  37 year old female with complaint of nausea, vomiting with generalized abdominal pain.  On exam, appears to feel unwell although nontoxic and in no distress.  She has very mild generalized abdominal tenderness without guarding or rebound.  She was tachycardic on arrival with a heart rate in the 130s, has since improved to the 70s.  Repeat EKG is improved.  She is found to have hypokalemia with potassium of 2.7 without corresponding EKG changes.  She is provided with p.o. potassium and is able to tolerate fluids.  Remaining workup and CT imaging are reassuring.  Patient is ready for discharge after IV fluid bolus and potassium.  Discharged with short course of potassium, recommend recheck with PCP with return to ER precautions. I ordered medication including IV fluids, potassium for hypokalemia, mild dehydration Reevaluation of the patient after these medicines showed that the patient  improved I have reviewed the patients home medicines and have made adjustments as needed   Social Determinants of Health:  Has PCP   Test / Admission - Considered:  Stable for discharge, no longer vomiting, is tolerating p.o.'s         Final Clinical Impression(s) / ED Diagnoses Final diagnoses:  Nausea and vomiting, unspecified vomiting type  Generalized abdominal pain  Hypokalemia    Rx / DC Orders ED Discharge Orders          Ordered    potassium chloride SA (KLOR-CON M) 20  MEQ tablet  2 times daily        09/10/22 0010              Alden Hipp 09/10/22 Maudie Mercury, MD 09/10/22 [phone]

## 2022-09-09 NOTE — ED Notes (Signed)
Called main lab, will add on quantitative HCG to existing labs.

## 2022-09-10 MED ORDER — POTASSIUM CHLORIDE CRYS ER 20 MEQ PO TBCR: 20.0000 meq | EXTENDED_RELEASE_TABLET | Freq: Two times a day (BID) | ORAL | 0 refills | Status: DC

## 2022-09-10 NOTE — Discharge Instructions (Signed)
Take potassium as prescribed.  Follow-up with your doctor for recheck.  Return to the ER for worsening or concerning symptoms.

## 2022-09-12 ENCOUNTER — Emergency Department (HOSPITAL_COMMUNITY)
Admission: EM | Admit: 2022-09-12 | Discharge: 2022-09-12 | Disposition: A | Discharge status: Home / Self Care | Payer: Medicaid Other | Attending: Emergency Medicine | Admitting: Emergency Medicine

## 2022-09-12 ENCOUNTER — Encounter (HOSPITAL_COMMUNITY): Payer: Self-pay

## 2022-09-12 ENCOUNTER — Other Ambulatory Visit: Payer: Self-pay

## 2022-09-12 DIAGNOSIS — R1110 Vomiting, unspecified: Secondary | ICD-10-CM

## 2022-09-12 DIAGNOSIS — R779 Abnormality of plasma protein, unspecified: Secondary | ICD-10-CM | POA: Diagnosis not present

## 2022-09-12 DIAGNOSIS — D72829 Elevated white blood cell count, unspecified: Secondary | ICD-10-CM | POA: Insufficient documentation

## 2022-09-12 DIAGNOSIS — R112 Nausea with vomiting, unspecified: Secondary | ICD-10-CM | POA: Insufficient documentation

## 2022-09-12 DIAGNOSIS — R1084 Generalized abdominal pain: Secondary | ICD-10-CM | POA: Insufficient documentation

## 2022-09-12 LAB — COMPREHENSIVE METABOLIC PANEL
ALT: 9 U/L (ref 0–44)
AST: 19 U/L (ref 15–41)
Albumin: 3.6 g/dL (ref 3.5–5.0)
Alkaline Phosphatase: 73 U/L (ref 38–126)
Anion gap: 9 (ref 5–15)
BUN: 5 mg/dL — ABNORMAL LOW (ref 6–20)
CO2: 26 mmol/L (ref 22–32)
Calcium: 9.2 mg/dL (ref 8.9–10.3)
Chloride: 105 mmol/L (ref 98–111)
Creatinine, Ser: 0.81 mg/dL (ref 0.44–1.00)
GFR, Estimated: >60 mL/min (ref >60)
Glucose, Bld: 127 mg/dL — ABNORMAL HIGH (ref 70–99)
Potassium: 4.4 mmol/L (ref 3.5–5.1)
Sodium: 140 mmol/L (ref 135–145)
Total Bilirubin: 0.5 mg/dL (ref 0.3–1.2)
Total Protein: 6.3 g/dL — ABNORMAL LOW (ref 6.5–8.1)

## 2022-09-12 LAB — URINALYSIS, ROUTINE W REFLEX MICROSCOPIC
Bilirubin Urine: NEGATIVE
Glucose, UA: NEGATIVE mg/dL
Hgb urine dipstick: NEGATIVE
Ketones, ur: NEGATIVE mg/dL
Leukocytes,Ua: NEGATIVE
Nitrite: NEGATIVE
Protein, ur: 30 mg/dL — AB
Specific Gravity, Urine: 1.02 (ref 1.005–1.030)
pH: 8 (ref 5.0–8.0)

## 2022-09-12 LAB — CBC
HCT: 39.2 % (ref 36.0–46.0)
Hemoglobin: 13.4 g/dL (ref 12.0–15.0)
MCH: 33.8 pg (ref 26.0–34.0)
MCHC: 34.2 g/dL (ref 30.0–36.0)
MCV: 99 fL (ref 80.0–100.0)
Platelets: 426 10*3/uL — ABNORMAL HIGH (ref 150–400)
RBC: 3.96 MIL/uL (ref 3.87–5.11)
RDW: 13.5 % (ref 11.5–15.5)
WBC: 15.1 10*3/uL — ABNORMAL HIGH (ref 4.0–10.5)
nRBC: 0 % (ref 0.0–0.2)

## 2022-09-12 LAB — LIPASE, BLOOD: Lipase: 28 U/L (ref 11–51)

## 2022-09-12 LAB — I-STAT BETA HCG BLOOD, ED (MC, WL, AP ONLY): I-stat hCG, quantitative: 71 m[IU]/mL — ABNORMAL HIGH (ref <5)

## 2022-09-12 LAB — HCG, QUANTITATIVE, PREGNANCY: hCG, Beta Chain, Quant, S: <1 m[IU]/mL (ref <5)

## 2022-09-12 MED ORDER — LACTATED RINGERS IV BOLUS
1000.0000 mL | Freq: Once | INTRAVENOUS | Status: AC
  Administered 2022-09-12: 1000 mL via INTRAVENOUS

## 2022-09-12 MED ORDER — DIPHENHYDRAMINE HCL 50 MG/ML IJ SOLN
12.5000 mg | Freq: Once | INTRAMUSCULAR | Status: AC
  Administered 2022-09-12: 12.5 mg via INTRAVENOUS
  Filled 2022-09-12: qty 1

## 2022-09-12 MED ORDER — HALOPERIDOL LACTATE 5 MG/ML IJ SOLN
5.0000 mg | Freq: Once | INTRAMUSCULAR | Status: AC
  Administered 2022-09-12: 5 mg via INTRAVENOUS
  Filled 2022-09-12: qty 1

## 2022-09-12 MED ORDER — SODIUM CHLORIDE 0.9 % IV BOLUS
1000.0000 mL | Freq: Once | INTRAVENOUS | Status: AC
  Administered 2022-09-12: 1000 mL via INTRAVENOUS

## 2022-09-12 MED ORDER — ONDANSETRON 4 MG PO TBDP: 4.0000 mg | ORAL_TABLET | Freq: Once | ORAL | Status: DC | PRN

## 2022-09-12 NOTE — ED Provider Notes (Signed)
[place] EMERGENCY DEPARTMENT AT [facility] Provider Note   CSN: [id number] Arrival date & time: 09/12/22  1002     History No chief complaint on file.   Laura Montoya is a 37 y.o. female with history of bipolar, schizophrenia, prolonged QT, hypomagnesia, hypokalemia, presents emerged from today for evaluation of continued abdominal pain.  Patient was seen on 09-09-2022 and was given a CT scan and labs.  She reports that she feels better with potassium and IV fluids at that point.  She reports that abdominal pain was still there but had improved.  She reports that she woke up this morning around 0300 with worsening burning and cramping abdominal pain diffusely.  She reports that she has had more than 10 episodes of vomiting but less than 20 episodes.  Denies any blood or bile.  She denies any chest pain or shortness of breath.  Reports had an elevated temperature a few days ago by 9.5 but no other reported fevers or chills.  She reports that she thinks she may have some pain with urination but denies any blood with urination.  Denies any vaginal discharge or vaginal bleeding.  She reports that she used to smoke marijuana daily however has not over the past few days.  She thinks this pain is worse than the pain she had a few days prior.  No medication tried at home.  HPI     Home Medications Prior to Admission medications   Medication Sig Start Date End Date Taking? Authorizing Provider  albuterol (PROVENTIL HFA;VENTOLIN HFA) 108 (90 Base) MCG/ACT inhaler Inhale 1-2 puffs into the lungs every 6 (six) hours as needed for wheezing or shortness of breath.    [provider]  Azelastine HCl 137 MCG/SPRAY SOLN Place 2 sprays into both nostrils 2 (two) times daily as needed for allergies. 04/17/21   [provider]  buPROPion ER (WELLBUTRIN SR) 100 MG 12 hr tablet Take 1 tablet (100 mg total) by mouth daily. 08/31/22 09/30/22  Jeannie Fend, PA-C  busPIRone (BUSPAR)  7.5 MG tablet Take 1 tablet (7.5 mg total) by mouth 2 (two) times daily. 08/31/22   Jeannie Fend, PA-C  Cyanocobalamin (B-12) 2500 MCG TABS Take 1 tablet by mouth daily.    [provider]  cyclobenzaprine (FLEXERIL) 10 MG tablet Take 10 mg by mouth 2 (two) times daily as needed for muscle spasms. 07/14/21   [provider]  DULoxetine (CYMBALTA) 30 MG capsule Take 1 capsule (30 mg total) by mouth daily. 06/20/22   Nita Sickle K, DO  folic acid (FOLVITE) 1 MG tablet Take 1 mg by mouth daily. 06/27/21   [provider]  hydroxychloroquine (PLAQUENIL) 200 MG tablet Take 200 mg by mouth at bedtime.    [provider]  methotrexate (RHEUMATREX) 2.5 MG tablet Take 12.5 mg by mouth every Sunday. (5 tablets) 07/19/21   [provider]  metoprolol succinate (TOPROL-XL) 25 MG 24 hr tablet Take 1 tablet by mouth daily. 05/10/22   [provider]  ondansetron (ZOFRAN-ODT) 8 MG disintegrating tablet Take 1 tablet (8 mg total) by mouth every 8 (eight) hours as needed for nausea or vomiting. 08/18/21   Linwood Dibbles, MD  potassium chloride SA (KLOR-CON M) 20 MEQ tablet Take 1 tablet (20 mEq total) by mouth 2 (two) times daily. 09/10/22   Jeannie Fend, PA-C      Allergies    Nsaids, Carbamazepine, and Lavender oil    Review of Systems  Review of Systems  Constitutional:  Positive for fever (Tmax 99.46F). Negative for chills.  Respiratory:  Negative for cough and shortness of breath.   Cardiovascular:  Negative for chest pain.  Gastrointestinal:  Positive for abdominal pain, nausea and vomiting. Negative for blood in stool, constipation and diarrhea.  Genitourinary:  Positive for dysuria. Negative for hematuria, vaginal bleeding and vaginal discharge.    Physical Exam Updated Vital Signs BP 132/67   Pulse 76   Temp 98.6 F (37 C) (Oral)   Resp (!) 24   LMP 08/24/2022   SpO2 100%  Physical Exam Vitals and nursing note reviewed.  Constitutional:       Appearance: She is not toxic-appearing.     Comments: Resting comfortably on stretcher, awakened to touch and started shaking. Tearful.  HENT:     Mouth/Throat:     Mouth: Mucous membranes are dry.  Cardiovascular:     Rate and Rhythm: Normal rate.  Pulmonary:     Effort: Pulmonary effort is normal. No respiratory distress.  Abdominal:     General: Bowel sounds are normal.     Palpations: Abdomen is soft.     Tenderness: There is abdominal tenderness. There is no guarding or rebound.     Comments: Diffuse abdominal tenderness without guarding or rebound. Soft. Non distended.   Musculoskeletal:     Cervical back: Normal range of motion.  Skin:    General: Skin is warm and dry.  Neurological:     Mental Status: She is alert.     ED Results / Procedures / Treatments   Labs (all labs ordered are listed, but only abnormal results are displayed) Labs Reviewed  COMPREHENSIVE METABOLIC PANEL - Abnormal; Notable for the following components:      Result Value   Glucose, Bld 127 (*)    BUN 5 (*)    Total Protein 6.3 (*)    All other components within normal limits  CBC - Abnormal; Notable for the following components:   WBC 15.1 (*)    Platelets 426 (*)    All other components within normal limits  URINALYSIS, ROUTINE W REFLEX MICROSCOPIC - Abnormal; Notable for the following components:   Protein, ur 30 (*)    Bacteria, UA RARE (*)    All other components within normal limits  I-STAT BETA HCG BLOOD, ED ([facility], [facility], [facility] ONLY) - Abnormal; Notable for the following components:   I-stat hCG, quantitative 71.0 (*)    All other components within normal limits  LIPASE, BLOOD  HCG, QUANTITATIVE, PREGNANCY    EKG None  Radiology No results found.  Procedures Procedures   Medications Ordered in ED Medications  ondansetron (ZOFRAN-ODT) disintegrating tablet 4 mg (has no administration in time range)    ED Course/ Medical Decision Making/ A&P   Medical Decision Making Amount  and/or Complexity of Data Reviewed Labs: ordered.  Risk Prescription drug management.   37 y.o. female presents to the ER for evaluation of abdominal pain with nausea vomiting. Differential diagnosis includes but is not limited to AAA, mesenteric ischemia, appendicitis, diverticulitis, DKA, gastroenteritis, nephrolithiasis, pancreatitis, constipation, UTI, bowel obstruction, biliary disease, IBD, PUD, hepatitis, ectopic pregnancy, ovarian torsion, PID. Vital signs show mild tachypnea however patient anxious appearing otherwise unremarkable. Physical exam as noted above.   I independently reviewed and interpreted the patient's labs.  CMP shows mildly increased glucose at 127.  Creatinine at 0.81.  Mildly decreased total protein 8.3.  Otherwise, no electrolyte or LFT abnormality.  CBC does  show worsening leukocytosis with a white count of 15.  Elevated platelets as well which appear similar to previous.  Lipase within normal limits.  Urinalysis shows 30 protein and rare bacteria but no nitrates, leukocytes, or white blood cells present.  I-STAT beta-hCG elevated, will obtain hCG quantitative labs sent down to check for accuracy.  Pending at this time.  On previous chart evaluation, patient's been seen multiple times for cyclical vomiting syndrome.  She does smoke marijuana daily but has not for the past few days.  She does have slightly increased white blood cell count in pain but can see how she responds to medication before considering imaging intervention.  Will leave decision on to the oncoming team.  EKG ordered as the patient has a h/o prolonged QT in the past.   4:00 PM Care of Laura Montoya  transferred to PA Arthor Captain at the end of my shift as the patient will require reassessment once labs/imaging have resulted. Patient presentation, ED course, and plan of care discussed with review of all pertinent labs and imaging. Please see his/her note for further details regarding further ED  course and disposition. Plan at time of handoff is follow up on labs and pregnancy test, medicate. This may be altered or completely changed at the discretion of the oncoming team pending results of further workup.   Portions of this report may have been transcribed using voice recognition software. Every effort was made to ensure accuracy; however, inadvertent computerized transcription errors may be present.   Final Clinical Impression(s) / ED Diagnoses Final diagnoses:  None    Rx / DC Orders ED Discharge Orders     None         Achille Rich, PA-C 09/12/22 1601    Alvira Monday, MD 09/13/22 2202

## 2022-09-12 NOTE — Discharge Instructions (Signed)

## 2022-09-12 NOTE — ED Triage Notes (Signed)
Patient seen on Friday for abdominal cramping and vomiting and same returned yesterday. No active vomiting on arrival

## 2022-09-20 ENCOUNTER — Emergency Department (HOSPITAL_BASED_OUTPATIENT_CLINIC_OR_DEPARTMENT_OTHER)
Admission: EM | Admit: 2022-09-20 | Discharge: 2022-09-20 | Disposition: A | Discharge status: Home / Self Care | Payer: Medicaid Other | Attending: Emergency Medicine | Admitting: Emergency Medicine

## 2022-09-20 ENCOUNTER — Other Ambulatory Visit: Payer: Self-pay

## 2022-09-20 ENCOUNTER — Encounter (HOSPITAL_BASED_OUTPATIENT_CLINIC_OR_DEPARTMENT_OTHER): Payer: Self-pay

## 2022-09-20 DIAGNOSIS — M5442 Lumbago with sciatica, left side: Secondary | ICD-10-CM | POA: Diagnosis not present

## 2022-09-20 DIAGNOSIS — M545 Low back pain, unspecified: Secondary | ICD-10-CM | POA: Diagnosis present

## 2022-09-20 LAB — URINALYSIS, ROUTINE W REFLEX MICROSCOPIC
Bilirubin Urine: NEGATIVE
Glucose, UA: NEGATIVE mg/dL
Hgb urine dipstick: NEGATIVE
Ketones, ur: NEGATIVE mg/dL
Leukocytes,Ua: NEGATIVE
Nitrite: NEGATIVE
Protein, ur: NEGATIVE mg/dL
Specific Gravity, Urine: <1.005 — ABNORMAL LOW (ref 1.005–1.030)
pH: 6 (ref 5.0–8.0)

## 2022-09-20 LAB — PREGNANCY, URINE: Preg Test, Ur: NEGATIVE

## 2022-09-20 MED ORDER — KETOROLAC TROMETHAMINE 15 MG/ML IJ SOLN: 15.0000 mg | Freq: Once | INTRAMUSCULAR | Status: DC

## 2022-09-20 MED ORDER — IBUPROFEN 600 MG PO TABS: 600.0000 mg | ORAL_TABLET | Freq: Four times a day (QID) | ORAL | 0 refills | Status: DC | PRN

## 2022-09-20 MED ORDER — KETOROLAC TROMETHAMINE 15 MG/ML IJ SOLN
15.0000 mg | Freq: Once | INTRAMUSCULAR | Status: AC
  Administered 2022-09-20: 15 mg via INTRAMUSCULAR
  Filled 2022-09-20: qty 1

## 2022-09-20 NOTE — ED Triage Notes (Signed)
Patient here POV from Home.  Endorses Lower Back Pain for approximately 24 Hours. No Dysuria or Hematuria. No N/V/D. No fevers. No Trauma or Injury. Radiates at times to left Leg.   NAD Noted during Triage. A&Ox4. GCS 15. Ambulatory.

## 2022-09-20 NOTE — ED Provider Notes (Signed)
[place] EMERGENCY DEPARTMENT AT [facility] Provider Note   CSN: [id number] Arrival date & time: 09/20/22  1955     History  Chief Complaint  Patient presents with   Back Pain    Laura Montoya is a 37 y.o. female.  Patient presents to the emergency room complaining of low back pain in her left side with radiation down the left leg.  Patient states pain began last night.  She states that her mother tried to help rub the area and she has began to have pain shooting down the left leg down to the level of the foot.  The patient denies diabetes, IV drug use, recent spinal surgery, immunosuppressive status.  Patient denies weakness, injury to the area, saddle anesthesia, urinary incontinence, fecal incontinence, urinary retention.  Past medical history significant for scoliosis, osteopenia, anxiety, schizophrenia  HPI     Home Medications Prior to Admission medications   Medication Sig Start Date End Date Taking? Authorizing Provider  ibuprofen (ADVIL) 600 MG tablet Take 1 tablet (600 mg total) by mouth every 6 (six) hours as needed. 09/20/22  Yes Darrick Grinder, PA-C  albuterol (PROVENTIL HFA;VENTOLIN HFA) 108 (90 Base) MCG/ACT inhaler Inhale 1-2 puffs into the lungs every 6 (six) hours as needed for wheezing or shortness of breath.    [provider]  Azelastine HCl 137 MCG/SPRAY SOLN Place 2 sprays into both nostrils 2 (two) times daily as needed for allergies. 04/17/21   [provider]  buPROPion ER (WELLBUTRIN SR) 100 MG 12 hr tablet Take 1 tablet (100 mg total) by mouth daily. 08/31/22 09/30/22  Jeannie Fend, PA-C  busPIRone (BUSPAR) 7.5 MG tablet Take 1 tablet (7.5 mg total) by mouth 2 (two) times daily. 08/31/22   Jeannie Fend, PA-C  Cyanocobalamin (B-12) 2500 MCG TABS Take 1 tablet by mouth daily.    [provider]  cyclobenzaprine (FLEXERIL) 10 MG tablet Take 10 mg by mouth 2 (two) times daily as needed for muscle spasms. 07/14/21    [provider]  DULoxetine (CYMBALTA) 30 MG capsule Take 1 capsule (30 mg total) by mouth daily. 06/20/22   Nita Sickle K, DO  folic acid (FOLVITE) 1 MG tablet Take 1 mg by mouth daily. 06/27/21   [provider]  hydroxychloroquine (PLAQUENIL) 200 MG tablet Take 200 mg by mouth at bedtime.    [provider]  methotrexate (RHEUMATREX) 2.5 MG tablet Take 12.5 mg by mouth every Sunday. (5 tablets) 07/19/21   [provider]  metoprolol succinate (TOPROL-XL) 25 MG 24 hr tablet Take 1 tablet by mouth daily. 05/10/22   [provider]  ondansetron (ZOFRAN-ODT) 8 MG disintegrating tablet Take 1 tablet (8 mg total) by mouth every 8 (eight) hours as needed for nausea or vomiting. 08/18/21   Linwood Dibbles, MD  potassium chloride SA (KLOR-CON M) 20 MEQ tablet Take 1 tablet (20 mEq total) by mouth 2 (two) times daily. 09/10/22   Jeannie Fend, PA-C      Allergies    Nsaids, Carbamazepine, and Lavender oil    Review of Systems   Review of Systems  Physical Exam Updated Vital Signs BP 115/78 (BP Location: Right Arm)   Pulse 97   Temp 98.6 F (37 C) (Oral)   Resp 15   Ht 5\' 2"  (1.575 m)   Wt 59 kg   LMP 08/24/2022   SpO2 99%   BMI 23.78 kg/m  Physical Exam Vitals and nursing note reviewed.  Constitutional:      General: She is not in acute distress.    Appearance: She is well-developed.  HENT:     Head: Normocephalic and atraumatic.  Eyes:     Conjunctiva/sclera: Conjunctivae normal.  Cardiovascular:     Rate and Rhythm: Normal rate and regular rhythm.     Heart sounds: No murmur heard. Pulmonary:     Effort: Pulmonary effort is normal. No respiratory distress.     Breath sounds: Normal breath sounds.  Abdominal:     Palpations: Abdomen is soft.     Tenderness: There is no abdominal tenderness.  Musculoskeletal:        General: Tenderness present. No swelling.     Cervical back: Neck supple.     Comments: Tenderness to palpation of the left  lower back.  No midline spinal tenderness appreciated.  Patient with normal range of motion of the left hip and lower leg.  Skin:    General: Skin is warm and dry.     Capillary Refill: Capillary refill takes less than 2 seconds. Brisk cap refill on the left lower extremity Neurological:     Mental Status: She is alert.     Sensory: No sensory deficit.     Motor: No weakness.  Psychiatric:        Mood and Affect: Mood normal.     ED Results / Procedures / Treatments   Labs (all labs ordered are listed, but only abnormal results are displayed) Labs Reviewed  URINALYSIS, ROUTINE W REFLEX MICROSCOPIC - Abnormal; Notable for the following components:      Result Value   Color, Urine COLORLESS (*)    Specific Gravity, Urine <1.005 (*)    All other components within normal limits  PREGNANCY, URINE    EKG None  Radiology No results found.  Procedures Procedures    Medications Ordered in ED Medications  ketorolac (TORADOL) 15 MG/ML injection 15 mg (15 mg Intramuscular Given 09/20/22 2129)    ED Course/ Medical Decision Making/ A&P                             Medical Decision Making Amount and/or Complexity of Data Reviewed Labs: ordered.  Risk Prescription drug management.   This patient presents to the ED for concern of left-sided low back pain, this involves an extensive number of treatment options, and is a complaint that carries with it a high risk of complications and morbidity.  The differential diagnosis includes low back pain with radiculopathy, trauma, abscess, cauda equina, others   Co morbidities that complicate the patient evaluation  History of degenerative disc disease in the lumbar spine, osteopenia   Additional history obtained:  Additional history obtained from family at bedside External records from outside source obtained and reviewed including rheumatology notes from last week   Imaging Studies ordered:  No red flag symptoms to  necessitate spinal imaging at this time.  No risk factors for epidural abscess, no red flag symptoms to suggest cauda equina     Problem List / ED Course / Critical interventions / Medication management   I ordered medication including Toradol for inflammation Reevaluation of the patient after these medicines showed that the patient improved I have reviewed the patients home medicines and have made adjustments as needed   Social Determinants of Health:  Patient is Medicaid for her primary insurance type   Test / Admission - Considered:  Patient with symptoms consistent  with lumbar radiculopathy.  No evidence of epidural abscess, cauda equina at this time.  Patient with known degenerative disc disease in the lumbar spine.  Discussed steroids with the patient but patient states she has plan for SI joint injections with pain management next week and they would not perform these if she gets steroids.  Will prescribe a short course of anti-inflammatory medication to be started tomorrow night.  Patient will follow-up with pain management for further evaluation and management as needed.  Return precautions provided         Final Clinical Impression(s) / ED Diagnoses Final diagnoses:  Acute left-sided low back pain with left-sided sciatica    Rx / DC Orders ED Discharge Orders          Ordered    ibuprofen (ADVIL) 600 MG tablet  Every 6 hours PRN        09/20/22 2141              Pamala Duffel 09/20/22 2142    Benjiman Core, MD 09/20/22 2336

## 2022-09-20 NOTE — Discharge Instructions (Signed)
You were evaluated today for low back pain.  You also have symptoms consistent with sciatica.  Please take the prescribed anti-inflammatory medication starting tomorrow night.  Please follow-up with pain management as discussed for further evaluation and management.  If you develop any life-threatening symptoms such as numbness in the groin, inability to urinate, sudden incontinence please return to the emergency department immediately.

## 2022-10-24 ENCOUNTER — Emergency Department (HOSPITAL_COMMUNITY)
Admission: EM | Admit: 2022-10-24 | Discharge: 2022-10-24 | Disposition: A | Discharge status: Home / Self Care | Payer: MEDICAID | Attending: Emergency Medicine | Admitting: Emergency Medicine

## 2022-10-24 ENCOUNTER — Other Ambulatory Visit: Payer: Self-pay

## 2022-10-24 ENCOUNTER — Encounter (HOSPITAL_COMMUNITY): Payer: Self-pay

## 2022-10-24 DIAGNOSIS — J45909 Unspecified asthma, uncomplicated: Secondary | ICD-10-CM | POA: Diagnosis not present

## 2022-10-24 DIAGNOSIS — R1084 Generalized abdominal pain: Secondary | ICD-10-CM | POA: Insufficient documentation

## 2022-10-24 DIAGNOSIS — R112 Nausea with vomiting, unspecified: Secondary | ICD-10-CM | POA: Diagnosis not present

## 2022-10-24 DIAGNOSIS — R109 Unspecified abdominal pain: Secondary | ICD-10-CM | POA: Diagnosis present

## 2022-10-24 LAB — COMPREHENSIVE METABOLIC PANEL
ALT: 14 U/L (ref 0–44)
AST: 21 U/L (ref 15–41)
Albumin: 4.1 g/dL (ref 3.5–5.0)
Alkaline Phosphatase: 81 U/L (ref 38–126)
Anion gap: 13 (ref 5–15)
BUN: 6 mg/dL (ref 6–20)
CO2: 28 mmol/L (ref 22–32)
Calcium: 9.7 mg/dL (ref 8.9–10.3)
Chloride: 96 mmol/L — ABNORMAL LOW (ref 98–111)
Creatinine, Ser: 0.85 mg/dL (ref 0.44–1.00)
GFR, Estimated: >60 mL/min (ref >60)
Glucose, Bld: 128 mg/dL — ABNORMAL HIGH (ref 70–99)
Potassium: 3.5 mmol/L (ref 3.5–5.1)
Sodium: 137 mmol/L (ref 135–145)
Total Bilirubin: 0.7 mg/dL (ref 0.3–1.2)
Total Protein: 6.9 g/dL (ref 6.5–8.1)

## 2022-10-24 LAB — URINALYSIS, ROUTINE W REFLEX MICROSCOPIC
Glucose, UA: NEGATIVE mg/dL
Hgb urine dipstick: NEGATIVE
Ketones, ur: 5 mg/dL — AB
Leukocytes,Ua: NEGATIVE
Nitrite: NEGATIVE
Protein, ur: 100 mg/dL — AB
Specific Gravity, Urine: 1.024 (ref 1.005–1.030)
pH: 7 (ref 5.0–8.0)

## 2022-10-24 LAB — CBC
HCT: 39.7 % (ref 36.0–46.0)
Hemoglobin: 14 g/dL (ref 12.0–15.0)
MCH: 34.6 pg — ABNORMAL HIGH (ref 26.0–34.0)
MCHC: 35.3 g/dL (ref 30.0–36.0)
MCV: 98 fL (ref 80.0–100.0)
Platelets: 458 10*3/uL — ABNORMAL HIGH (ref 150–400)
RBC: 4.05 MIL/uL (ref 3.87–5.11)
RDW: 14.4 % (ref 11.5–15.5)
WBC: 14.9 10*3/uL — ABNORMAL HIGH (ref 4.0–10.5)
nRBC: 0 % (ref 0.0–0.2)

## 2022-10-24 LAB — PREGNANCY, URINE: Preg Test, Ur: NEGATIVE

## 2022-10-24 LAB — LIPASE, BLOOD: Lipase: 29 U/L (ref 11–51)

## 2022-10-24 MED ORDER — SODIUM CHLORIDE 0.9 % IV BOLUS
1000.0000 mL | Freq: Once | INTRAVENOUS | Status: AC
  Administered 2022-10-24: 1000 mL via INTRAVENOUS

## 2022-10-24 MED ORDER — PROCHLORPERAZINE EDISYLATE 10 MG/2ML IJ SOLN
5.0000 mg | Freq: Once | INTRAMUSCULAR | Status: AC
  Administered 2022-10-24: 5 mg via INTRAVENOUS
  Filled 2022-10-24: qty 2

## 2022-10-24 MED ORDER — ONDANSETRON 8 MG PO TBDP: 8.0000 mg | ORAL_TABLET | Freq: Three times a day (TID) | ORAL | 0 refills | Status: DC | PRN

## 2022-10-24 NOTE — ED Provider Notes (Signed)
[place] EMERGENCY DEPARTMENT AT [facility] Provider Note   CSN: [id number] Arrival date & time: 10/24/22  4098     History  Chief Complaint  Patient presents with   Abdominal Pain   Nausea   Emesis    Laura Montoya is a 37 y.o. female.   Abdominal Pain Associated symptoms: vomiting   Emesis Associated symptoms: abdominal pain   Patient presents with mid abdominal pain nausea and vomiting.  Began last night.  10 out of 10.  No fevers.  No diarrhea or constipation.  Has had pains like this before.  No known sick contacts.    Past Medical History:  Diagnosis Date   Anxiety    Asthma    Bipolar depression (HCC)    Dizziness    Dysmenorrhea    GERD (gastroesophageal reflux disease)    Headache    Mood disorder (HCC)    Schizophrenia (HCC)    Schizophrenia (HCC)    Scoliosis    Thrush    Past Surgical History:  Procedure Laterality Date   BREAST SURGERY  2020   MOUTH SURGERY  2012   NERVE REPAIR       Home Medications Prior to Admission medications   Medication Sig Start Date End Date Taking? Authorizing Provider  albuterol (PROVENTIL HFA;VENTOLIN HFA) 108 (90 Base) MCG/ACT inhaler Inhale 1-2 puffs into the lungs every 6 (six) hours as needed for wheezing or shortness of breath.    [provider]  Azelastine HCl 137 MCG/SPRAY SOLN Place 2 sprays into both nostrils 2 (two) times daily as needed for allergies. 04/17/21   [provider]  buPROPion ER (WELLBUTRIN SR) 100 MG 12 hr tablet Take 1 tablet (100 mg total) by mouth daily. 08/31/22 09/30/22  Jeannie Fend, PA-C  busPIRone (BUSPAR) 7.5 MG tablet Take 1 tablet (7.5 mg total) by mouth 2 (two) times daily. 08/31/22   Jeannie Fend, PA-C  Cyanocobalamin (B-12) 2500 MCG TABS Take 1 tablet by mouth daily.    [provider]  cyclobenzaprine (FLEXERIL) 10 MG tablet Take 10 mg by mouth 2 (two) times daily as needed for muscle spasms. 07/14/21   [provider]   DULoxetine (CYMBALTA) 30 MG capsule Take 1 capsule (30 mg total) by mouth daily. 06/20/22   Nita Sickle K, DO  folic acid (FOLVITE) 1 MG tablet Take 1 mg by mouth daily. 06/27/21   [provider]  hydroxychloroquine (PLAQUENIL) 200 MG tablet Take 200 mg by mouth at bedtime.    [provider]  ibuprofen (ADVIL) 600 MG tablet Take 1 tablet (600 mg total) by mouth every 6 (six) hours as needed. 09/20/22   Darrick Grinder, PA-C  methotrexate (RHEUMATREX) 2.5 MG tablet Take 12.5 mg by mouth every Sunday. (5 tablets) 07/19/21   [provider]  metoprolol succinate (TOPROL-XL) 25 MG 24 hr tablet Take 1 tablet by mouth daily. 05/10/22   [provider]  ondansetron (ZOFRAN-ODT) 8 MG disintegrating tablet Take 1 tablet (8 mg total) by mouth every 8 (eight) hours as needed for nausea or vomiting. 10/24/22   Benjiman Core, MD  potassium chloride SA (KLOR-CON M) 20 MEQ tablet Take 1 tablet (20 mEq total) by mouth 2 (two) times daily. 09/10/22   Jeannie Fend, PA-C      Allergies    Nsaids, Carbamazepine, and Lavender oil    Review of Systems   Review of Systems  Gastrointestinal:  Positive for abdominal pain and vomiting.  Physical Exam Updated Vital Signs BP (!) 132/95 (BP Location: Right Arm)   Pulse 88   Temp 98.2 F (36.8 C) (Oral)   Resp 18   SpO2 98%  Physical Exam Vitals and nursing note reviewed.  Cardiovascular:     Rate and Rhythm: Normal rate.  Pulmonary:     Breath sounds: Normal breath sounds.  Abdominal:     Comments: Mild abdominal tenderness no rebound or guarding.  No hernias palpated.  Does have a bellybutton ring.  Neurological:     Mental Status: She is alert.     ED Results / Procedures / Treatments   Labs (all labs ordered are listed, but only abnormal results are displayed) Labs Reviewed  COMPREHENSIVE METABOLIC PANEL - Abnormal; Notable for the following components:      Result Value   Chloride 96 (*)    Glucose,  Bld 128 (*)    All other components within normal limits  CBC - Abnormal; Notable for the following components:   WBC 14.9 (*)    MCH 34.6 (*)    Platelets 458 (*)    All other components within normal limits  LIPASE, BLOOD  URINALYSIS, ROUTINE W REFLEX MICROSCOPIC  PREGNANCY, URINE    EKG None  Radiology No results found.  Procedures Procedures    Medications Ordered in ED Medications  sodium chloride 0.9 % bolus 1,000 mL (1,000 mLs Intravenous New Bag/Given 10/24/22 1036)  prochlorperazine (COMPAZINE) injection 5 mg (5 mg Intravenous Given 10/24/22 1035)    ED Course/ Medical Decision Making/ A&P                             Medical Decision Making Amount and/or Complexity of Data Reviewed Labs: ordered.  Risk Prescription drug management.   Patient nausea vomiting mid abdominal pain.  Differential diagnosis includes gastroenteritis, obstruction, mass.  Reviewed CT scan from roughly 2 months ago that did not show clear cause of similar abdominal pain that time.  Will get basic blood work and treat symptomatically.  Given fluid and Compazine.  White count is somewhat elevated as it was 2 months ago.  Blood work that is returned is reassuring.  Has had fluid and Compazine.  States she feels cold and does not want to stay here anymore.  Did not have urinalysis but with mid abdominal pain and without dysuria UTI felt less likely.  Seems similar pain to prior and did have elevated white count at that time.  Do not think we need imaging at this time.  Discharge home with outpatient follow-up and return as needed.         Final Clinical Impression(s) / ED Diagnoses Final diagnoses:  Generalized abdominal pain  Nausea and vomiting, unspecified vomiting type    Rx / DC Orders ED Discharge Orders          Ordered    ondansetron (ZOFRAN-ODT) 8 MG disintegrating tablet  Every 8 hours PRN        10/24/22 1227              Benjiman Core, MD 10/24/22 1234

## 2022-10-24 NOTE — ED Triage Notes (Signed)
Pt came in via POV d/t n/v & abd pain that started last night, A/Ox4, rates pain 10/10, denies any hematemesis or dizziness.

## 2022-10-29 ENCOUNTER — Encounter (HOSPITAL_BASED_OUTPATIENT_CLINIC_OR_DEPARTMENT_OTHER): Payer: Self-pay

## 2022-10-29 ENCOUNTER — Other Ambulatory Visit: Payer: Self-pay

## 2022-10-29 DIAGNOSIS — R1084 Generalized abdominal pain: Secondary | ICD-10-CM | POA: Insufficient documentation

## 2022-10-29 DIAGNOSIS — R110 Nausea: Secondary | ICD-10-CM | POA: Diagnosis not present

## 2022-10-29 DIAGNOSIS — J45909 Unspecified asthma, uncomplicated: Secondary | ICD-10-CM | POA: Diagnosis not present

## 2022-10-29 DIAGNOSIS — E876 Hypokalemia: Secondary | ICD-10-CM | POA: Diagnosis not present

## 2022-10-29 DIAGNOSIS — R197 Diarrhea, unspecified: Secondary | ICD-10-CM | POA: Insufficient documentation

## 2022-10-29 DIAGNOSIS — R531 Weakness: Secondary | ICD-10-CM | POA: Insufficient documentation

## 2022-10-29 NOTE — ED Triage Notes (Signed)
POV from home, A&O x 4, GCS 15, amb to triage  Pt sts migraine that started today, tried home meds without relief. Recently seen at [facility] for emesis and dehydration, sts this has not improved. Zofran did not help at home.

## 2022-10-30 ENCOUNTER — Emergency Department (HOSPITAL_BASED_OUTPATIENT_CLINIC_OR_DEPARTMENT_OTHER)
Admission: EM | Admit: 2022-10-30 | Discharge: 2022-10-30 | Disposition: A | Discharge status: Home / Self Care | Payer: MEDICAID | Attending: Emergency Medicine | Admitting: Emergency Medicine

## 2022-10-30 DIAGNOSIS — R1084 Generalized abdominal pain: Secondary | ICD-10-CM

## 2022-10-30 DIAGNOSIS — E876 Hypokalemia: Secondary | ICD-10-CM

## 2022-10-30 LAB — CBC WITH DIFFERENTIAL/PLATELET
Abs Immature Granulocytes: 0.04 10*3/uL (ref 0.00–0.07)
Basophils Absolute: 0 10*3/uL (ref 0.0–0.1)
Basophils Relative: 0 %
Eosinophils Absolute: 0 10*3/uL (ref 0.0–0.5)
Eosinophils Relative: 0 %
HCT: 40 % (ref 36.0–46.0)
Hemoglobin: 14.2 g/dL (ref 12.0–15.0)
Immature Granulocytes: 0 %
Lymphocytes Relative: 35 %
Lymphs Abs: 4 10*3/uL (ref 0.7–4.0)
MCH: 34.8 pg — ABNORMAL HIGH (ref 26.0–34.0)
MCHC: 35.5 g/dL (ref 30.0–36.0)
MCV: 98 fL (ref 80.0–100.0)
Monocytes Absolute: 0.7 10*3/uL (ref 0.1–1.0)
Monocytes Relative: 6 %
Neutro Abs: 6.7 10*3/uL (ref 1.7–7.7)
Neutrophils Relative %: 59 %
Platelets: 362 10*3/uL (ref 150–400)
RBC: 4.08 MIL/uL (ref 3.87–5.11)
RDW: 14.2 % (ref 11.5–15.5)
WBC: 11.5 10*3/uL — ABNORMAL HIGH (ref 4.0–10.5)
nRBC: 0 % (ref 0.0–0.2)

## 2022-10-30 LAB — MAGNESIUM: Magnesium: 1.8 mg/dL (ref 1.7–2.4)

## 2022-10-30 LAB — COMPREHENSIVE METABOLIC PANEL
ALT: 6 U/L (ref 0–44)
AST: 10 U/L — ABNORMAL LOW (ref 15–41)
Albumin: 4.2 g/dL (ref 3.5–5.0)
Alkaline Phosphatase: 69 U/L (ref 38–126)
Anion gap: 10 (ref 5–15)
BUN: 5 mg/dL — ABNORMAL LOW (ref 6–20)
CO2: 33 mmol/L — ABNORMAL HIGH (ref 22–32)
Calcium: 9.6 mg/dL (ref 8.9–10.3)
Chloride: 94 mmol/L — ABNORMAL LOW (ref 98–111)
Creatinine, Ser: 0.65 mg/dL (ref 0.44–1.00)
GFR, Estimated: >60 mL/min (ref >60)
Glucose, Bld: 88 mg/dL (ref 70–99)
Potassium: 2.7 mmol/L — CL (ref 3.5–5.1)
Sodium: 137 mmol/L (ref 135–145)
Total Bilirubin: 0.5 mg/dL (ref 0.3–1.2)
Total Protein: 6.5 g/dL (ref 6.5–8.1)

## 2022-10-30 LAB — LIPASE, BLOOD: Lipase: <10 U/L — ABNORMAL LOW (ref 11–51)

## 2022-10-30 LAB — HCG, QUANTITATIVE, PREGNANCY: hCG, Beta Chain, Quant, S: <1 m[IU]/mL (ref <5)

## 2022-10-30 MED ORDER — POTASSIUM CHLORIDE 10 MEQ/100ML IV SOLN
10.0000 meq | INTRAVENOUS | Status: DC
  Administered 2022-10-30: 10 meq via INTRAVENOUS
  Filled 2022-10-30: qty 100

## 2022-10-30 MED ORDER — ACETAMINOPHEN 325 MG PO TABS
650.0000 mg | ORAL_TABLET | Freq: Once | ORAL | Status: AC
  Administered 2022-10-30: 650 mg via ORAL
  Filled 2022-10-30: qty 2

## 2022-10-30 MED ORDER — SODIUM CHLORIDE 0.9 % IV BOLUS
1000.0000 mL | Freq: Once | INTRAVENOUS | Status: AC
  Administered 2022-10-30: 1000 mL via INTRAVENOUS

## 2022-10-30 MED ORDER — POTASSIUM CHLORIDE CRYS ER 20 MEQ PO TBCR
40.0000 meq | EXTENDED_RELEASE_TABLET | Freq: Once | ORAL | Status: AC
  Administered 2022-10-30: 40 meq via ORAL
  Filled 2022-10-30: qty 2

## 2022-10-30 MED ORDER — POTASSIUM CHLORIDE ER 20 MEQ PO TBCR: 20.0000 meq | EXTENDED_RELEASE_TABLET | Freq: Every day | ORAL | 0 refills | Status: DC

## 2022-10-30 MED ORDER — ONDANSETRON HCL 4 MG/2ML IJ SOLN
4.0000 mg | Freq: Once | INTRAMUSCULAR | Status: AC
  Administered 2022-10-30: 4 mg via INTRAVENOUS
  Filled 2022-10-30: qty 2

## 2022-10-30 NOTE — ED Provider Notes (Signed)
[place] EMERGENCY DEPARTMENT AT [facility] Provider Note   CSN: [id number] Arrival date & time: 10/29/22  2233     History  Chief Complaint  Patient presents with   Migraine    Laura Montoya is a 37 y.o. female.  The history is provided by the patient and a parent.  Patient presents with multiple complaints.  Patient has a history of anxiety, bipolar She reports for the past several days she has had diffuse abdominal pain.  She has had nausea, but no vomiting in the past 24 hours.  She does report mild case of diarrhea.  She reports feeling dehydrated, though no vomiting.  She reports she is maintaining urine output.  No chest pain or shortness of breath.  On the way to the hospital she started having a mild headache.  She reports generalized weakness.  No new visual changes.  No tick bites or rashes. She was recently seen in the ER on July 1 and has never had improvement of her nausea Denies previous abdominal surgery   No chest pain or shortness of breath Past Medical History:  Diagnosis Date   Anxiety    Asthma    Bipolar depression (HCC)    Dizziness    Dysmenorrhea    GERD (gastroesophageal reflux disease)    Headache    Mood disorder (HCC)    Schizophrenia (HCC)    Schizophrenia (HCC)    Scoliosis    Thrush     Home Medications Prior to Admission medications   Medication Sig Start Date End Date Taking? Authorizing Provider  potassium chloride 20 MEQ TBCR Take 1 tablet (20 mEq total) by mouth daily. 10/30/22  Yes Zadie Rhine, MD  albuterol (PROVENTIL HFA;VENTOLIN HFA) 108 (90 Base) MCG/ACT inhaler Inhale 1-2 puffs into the lungs every 6 (six) hours as needed for wheezing or shortness of breath.    [provider]  Azelastine HCl 137 MCG/SPRAY SOLN Place 2 sprays into both nostrils 2 (two) times daily as needed for allergies. 04/17/21   [provider]  buPROPion ER (WELLBUTRIN SR) 100 MG 12 hr tablet Take 1 tablet (100 mg total)  by mouth daily. 08/31/22 09/30/22  Jeannie Fend, PA-C  busPIRone (BUSPAR) 7.5 MG tablet Take 1 tablet (7.5 mg total) by mouth 2 (two) times daily. 08/31/22   Jeannie Fend, PA-C  Cyanocobalamin (B-12) 2500 MCG TABS Take 1 tablet by mouth daily.    [provider]  DULoxetine (CYMBALTA) 30 MG capsule Take 1 capsule (30 mg total) by mouth daily. 06/20/22   Nita Sickle K, DO  folic acid (FOLVITE) 1 MG tablet Take 1 mg by mouth daily. 06/27/21   [provider]  hydroxychloroquine (PLAQUENIL) 200 MG tablet Take 200 mg by mouth at bedtime.    [provider]  methotrexate (RHEUMATREX) 2.5 MG tablet Take 12.5 mg by mouth every Sunday. (5 tablets) 07/19/21   [provider]  metoprolol succinate (TOPROL-XL) 25 MG 24 hr tablet Take 1 tablet by mouth daily. 05/10/22   [provider]  ondansetron (ZOFRAN-ODT) 8 MG disintegrating tablet Take 1 tablet (8 mg total) by mouth every 8 (eight) hours as needed for nausea or vomiting. 10/24/22   Benjiman Core, MD      Allergies    Nsaids, Carbamazepine, and Lavender oil    Review of Systems   Review of Systems  Constitutional:  Positive for fatigue. Negative for fever.  Respiratory:  Negative for shortness of breath.  Cardiovascular:  Negative for chest pain.  Gastrointestinal:  Positive for abdominal pain and nausea. Negative for diarrhea and vomiting.  Genitourinary:  Negative for vaginal bleeding.  Neurological:  Positive for weakness and headaches.    Physical Exam Updated Vital Signs BP (!) 120/99   Pulse 75   Temp 97.9 F (36.6 C)   Resp 19   Ht 1.575 m (5\' 2" )   Wt 59 kg   LMP  (LMP Unknown)   SpO2 100%   BMI 23.79 kg/m  Physical Exam CONSTITUTIONAL: Well developed/well nourished HEAD: Normocephalic/atraumatic EYES: EOMI/PERRL, no nystagmus, no ptosis ENMT: Mucous membranes moist NECK: supple no meningeal signs, no bruits SPINE/BACK:entire spine nontender CV: S1/S2 noted, no  murmurs/rubs/gallops noted LUNGS: Lungs are clear to auscultation bilaterally, no apparent distress ABDOMEN: soft, mild diffuse tenderness, no rebound or guarding GU:no cva tenderness NEURO:Awake/alert, face symmetric, no arm or leg drift is noted Equal 5/5 strength with shoulder abduction, elbow flex/extension, wrist flex/extension in upper extremities and equal hand grips bilaterally Equal 5/5 strength with hip flexion,knee flex/extension, foot dorsi/plantar flexion Cranial nerves 3/4/5/6/10/31/08/11/12 tested and intact Gait normal without ataxia No past pointing Sensation to light touch intact in all extremities EXTREMITIES: pulses normal, full ROM SKIN: warm, color normal PSYCH: Mildly anxious ED Results / Procedures / Treatments   Labs (all labs ordered are listed, but only abnormal results are displayed) Labs Reviewed  CBC WITH DIFFERENTIAL/PLATELET - Abnormal; Notable for the following components:      Result Value   WBC 11.5 (*)    MCH 34.8 (*)    All other components within normal limits  COMPREHENSIVE METABOLIC PANEL - Abnormal; Notable for the following components:   Potassium 2.7 (*)    Chloride 94 (*)    CO2 33 (*)    BUN 5 (*)    AST 10 (*)    All other components within normal limits  LIPASE, BLOOD - Abnormal; Notable for the following components:   Lipase <10 (*)    All other components within normal limits  HCG, QUANTITATIVE, PREGNANCY  MAGNESIUM    EKG EKG Interpretation Date/Time:  Sunday October 30 2022 01:00:27 EDT Ventricular Rate:  79 PR Interval:  128 QRS Duration:  125 QT Interval:  398 QTC Calculation: 457 R Axis:   92  Text Interpretation: Sinus rhythm RBBB and LPFB Confirmed by Zadie Rhine ([id number]) on 10/30/2022 1:05:05 AM  Radiology No results found.  Procedures Procedures    Medications Ordered in ED Medications  potassium chloride 10 mEq in 100 mL IVPB (10 mEq Intravenous New Bag/Given 10/30/22 0201)  sodium chloride 0.9 % bolus  1,000 mL (0 mLs Intravenous Stopped 10/30/22 0157)  acetaminophen (TYLENOL) tablet 650 mg (650 mg Oral Given 10/30/22 0057)  ondansetron (ZOFRAN) injection 4 mg (4 mg Intravenous Given 10/30/22 0057)  potassium chloride SA (KLOR-CON M) CR tablet 40 mEq (40 mEq Oral Given 10/30/22 0202)    ED Course/ Medical Decision Making/ A&P Clinical Course as of 10/30/22 0230  Sun Oct 30, 2022  0151 Patient with multiple ER visits, now presenting with continued nausea and abdominal pain.  Overall her abdominal exam is unremarkable and her white count is improving.  However she is hypokalemic.  Will give fluids and potassium and reassess.  Will defer imaging for now Patient mentioned headache that started on the drive to the hospital.  She has no focal neurodeficits.  Will defer workup [DW]  0152 Initial EKG at 12:51 AM had significant artifact but was read  out as atrial flutter.  Repeat EKG at 1 AM reveals sinus rhythm.  No further episodes.  Suspect initial EKG was artifact [DW]  0228 Patient reports feeling improved.  She is not vomiting.  Reports pain is resolving.  She would like to be discharged home.  I advised her that her potassium is low and she would benefit from multiple rounds of IV K.  She would like to be discharged after the first IV run.  She would like to be discharged home with oral potassium Patient is not on any diuretics or any other meds that would cause this.  Likely due to the recent GI illness and lack of p.o. intake [DW]    Clinical Course User Index [DW] Zadie Rhine, MD                             Medical Decision Making Amount and/or Complexity of Data Reviewed Labs: ordered. ECG/medicine tests: ordered.  Risk OTC drugs. Prescription drug management.   This patient presents to the ED for concern of abdominal pain and nausea, this involves an extensive number of treatment options, and is a complaint that carries with it a high risk of complications and morbidity.  The  differential diagnosis includes but is not limited to cholecystitis, cholelithiasis, pancreatitis, gastritis, peptic ulcer disease, appendicitis, bowel obstruction, bowel perforation, diverticulitis, ectopic pregnancy, PID, tubo-ovarian abscess    Comorbidities that complicate the patient evaluation: Patient's presentation is complicated by their history of bipolar disorder  Social Determinants of Health: Patient's  frequent ER visits   increases the complexity of managing their presentation  Additional history obtained: Additional history obtained from family Records reviewed Care Everywhere/External Records  Lab Tests: I Ordered, and personally interpreted labs.  The pertinent results include: Hypokalemia  Cardiac Monitoring: The patient was maintained on a cardiac monitor.  I personally viewed and interpreted the cardiac monitor which showed an underlying rhythm of:  sinus rhythm  Medicines ordered and prescription drug management: I ordered medication including Zofran for nausea Reevaluation of the patient after these medicines showed that the patient    improved    Critical Interventions:   IV potassium   Reevaluation: After the interventions noted above, I reevaluated the patient and found that they have :improved  Complexity of problems addressed: Patient's presentation is most consistent with  acute presentation with potential threat to life or bodily function  Disposition: After consideration of the diagnostic results and the patient's response to treatment,  I feel that the patent would benefit from discharge   .           Final Clinical Impression(s) / ED Diagnoses Final diagnoses:  Hypokalemia  Generalized abdominal pain    Rx / DC Orders ED Discharge Orders          Ordered    potassium chloride 20 MEQ TBCR  Daily        10/30/22 0230              Zadie Rhine, MD 10/30/22 0231

## 2022-11-12 ENCOUNTER — Emergency Department (HOSPITAL_BASED_OUTPATIENT_CLINIC_OR_DEPARTMENT_OTHER)
Admission: EM | Admit: 2022-11-12 | Discharge: 2022-11-12 | Disposition: A | Discharge status: Home / Self Care | Payer: MEDICAID | Attending: Emergency Medicine | Admitting: Emergency Medicine

## 2022-11-12 ENCOUNTER — Other Ambulatory Visit: Payer: Self-pay

## 2022-11-12 ENCOUNTER — Encounter (HOSPITAL_BASED_OUTPATIENT_CLINIC_OR_DEPARTMENT_OTHER): Payer: Self-pay | Admitting: Emergency Medicine

## 2022-11-12 DIAGNOSIS — M5442 Lumbago with sciatica, left side: Secondary | ICD-10-CM | POA: Insufficient documentation

## 2022-11-12 DIAGNOSIS — D72829 Elevated white blood cell count, unspecified: Secondary | ICD-10-CM | POA: Insufficient documentation

## 2022-11-12 DIAGNOSIS — G8929 Other chronic pain: Secondary | ICD-10-CM | POA: Diagnosis not present

## 2022-11-12 DIAGNOSIS — M5441 Lumbago with sciatica, right side: Secondary | ICD-10-CM | POA: Diagnosis not present

## 2022-11-12 DIAGNOSIS — M545 Low back pain, unspecified: Secondary | ICD-10-CM | POA: Diagnosis present

## 2022-11-12 LAB — URINALYSIS, ROUTINE W REFLEX MICROSCOPIC
Bilirubin Urine: NEGATIVE
Glucose, UA: NEGATIVE mg/dL
Hgb urine dipstick: NEGATIVE
Ketones, ur: NEGATIVE mg/dL
Leukocytes,Ua: NEGATIVE
Nitrite: NEGATIVE
Protein, ur: NEGATIVE mg/dL
Specific Gravity, Urine: <1.005 — ABNORMAL LOW (ref 1.005–1.030)
pH: 6 (ref 5.0–8.0)

## 2022-11-12 LAB — CBC WITH DIFFERENTIAL/PLATELET
Abs Immature Granulocytes: 0.03 10*3/uL (ref 0.00–0.07)
Basophils Absolute: 0.1 10*3/uL (ref 0.0–0.1)
Basophils Relative: 0 %
Eosinophils Absolute: 0.1 10*3/uL (ref 0.0–0.5)
Eosinophils Relative: 1 %
HCT: 38.9 % (ref 36.0–46.0)
Hemoglobin: 13.8 g/dL (ref 12.0–15.0)
Immature Granulocytes: 0 %
Lymphocytes Relative: 30 %
Lymphs Abs: 4 10*3/uL (ref 0.7–4.0)
MCH: 35.6 pg — ABNORMAL HIGH (ref 26.0–34.0)
MCHC: 35.5 g/dL (ref 30.0–36.0)
MCV: 100.3 fL — ABNORMAL HIGH (ref 80.0–100.0)
Monocytes Absolute: 0.6 10*3/uL (ref 0.1–1.0)
Monocytes Relative: 4 %
Neutro Abs: 8.7 10*3/uL — ABNORMAL HIGH (ref 1.7–7.7)
Neutrophils Relative %: 65 %
Platelets: 410 10*3/uL — ABNORMAL HIGH (ref 150–400)
RBC: 3.88 MIL/uL (ref 3.87–5.11)
RDW: 13.5 % (ref 11.5–15.5)
WBC: 13.6 10*3/uL — ABNORMAL HIGH (ref 4.0–10.5)
nRBC: 0 % (ref 0.0–0.2)

## 2022-11-12 LAB — BASIC METABOLIC PANEL
Anion gap: 9 (ref 5–15)
BUN: 5 mg/dL — ABNORMAL LOW (ref 6–20)
CO2: 28 mmol/L (ref 22–32)
Calcium: 9.8 mg/dL (ref 8.9–10.3)
Chloride: 101 mmol/L (ref 98–111)
Creatinine, Ser: 0.6 mg/dL (ref 0.44–1.00)
GFR, Estimated: >60 mL/min (ref >60)
Glucose, Bld: 90 mg/dL (ref 70–99)
Potassium: 3.5 mmol/L (ref 3.5–5.1)
Sodium: 138 mmol/L (ref 135–145)

## 2022-11-12 LAB — PREGNANCY, URINE: Preg Test, Ur: NEGATIVE

## 2022-11-12 MED ORDER — PREDNISONE 50 MG PO TABS: 50.0000 mg | ORAL_TABLET | Freq: Every day | ORAL | 0 refills | Status: AC

## 2022-11-12 MED ORDER — KETOROLAC TROMETHAMINE 15 MG/ML IJ SOLN
15.0000 mg | Freq: Once | INTRAMUSCULAR | Status: AC
  Administered 2022-11-12: 15 mg via INTRAVENOUS
  Filled 2022-11-12: qty 1

## 2022-11-12 NOTE — ED Notes (Signed)
Pt provide pain medication; family at bedside, call light in reach.

## 2022-11-12 NOTE — ED Provider Notes (Signed)
[place] EMERGENCY DEPARTMENT AT [facility] Provider Note   CSN: [id number] Arrival date & time: 11/12/22  1946     History  Chief Complaint  Patient presents with   Back Pain    Laura Montoya is a 37 y.o. female who presents to the ED for her 7th visit in 2 months with concern for chronic back pain. States that she always has pain across her low back.  States that she symptoms has tingling down the backs of her legs, pain radiates into her hips.  This is unchanged from her chronic pain but is poorly controlled with her muscle relaxers at home at this time.  No numbness tingling or weakness in her groin, no weakness in the legs, no tingling at this time.  No fevers or chills, denies history of IV drug use.  States that she has an appointment with pain management on 11/23/2022.  States she is only taken Flexeril and smoked marijuana for the pain in the last 48 hours.  Does endorse history of SI joint injection 1 month ago.  Endorses a history of sciatica.  Denies any urinary or fecal incontinence, denies any urinary retention.  States Lidoderm patches do not help.  In addition to the above listed history patient has history of BPPV, schizophrenia, RA unclear what medication patient is taking for treatment of this.   HPI     Home Medications Prior to Admission medications   Medication Sig Start Date End Date Taking? Authorizing Provider  predniSONE (DELTASONE) 50 MG tablet Take 1 tablet (50 mg total) by mouth daily with breakfast for 5 days. 11/12/22 11/17/22 Yes Mazin Emma R, PA-C  albuterol (PROVENTIL HFA;VENTOLIN HFA) 108 (90 Base) MCG/ACT inhaler Inhale 1-2 puffs into the lungs every 6 (six) hours as needed for wheezing or shortness of breath.    [provider]  Azelastine HCl 137 MCG/SPRAY SOLN Place 2 sprays into both nostrils 2 (two) times daily as needed for allergies. 04/17/21   [provider]  buPROPion ER (WELLBUTRIN SR) 100 MG 12 hr  tablet Take 1 tablet (100 mg total) by mouth daily. 08/31/22 09/30/22  Jeannie Fend, PA-C  busPIRone (BUSPAR) 7.5 MG tablet Take 1 tablet (7.5 mg total) by mouth 2 (two) times daily. 08/31/22   Jeannie Fend, PA-C  Cyanocobalamin (B-12) 2500 MCG TABS Take 1 tablet by mouth daily.    [provider]  DULoxetine (CYMBALTA) 30 MG capsule Take 1 capsule (30 mg total) by mouth daily. 06/20/22   Nita Sickle K, DO  folic acid (FOLVITE) 1 MG tablet Take 1 mg by mouth daily. 06/27/21   [provider]  hydroxychloroquine (PLAQUENIL) 200 MG tablet Take 200 mg by mouth at bedtime.    [provider]  methotrexate (RHEUMATREX) 2.5 MG tablet Take 12.5 mg by mouth every Sunday. (5 tablets) 07/19/21   [provider]  metoprolol succinate (TOPROL-XL) 25 MG 24 hr tablet Take 1 tablet by mouth daily. 05/10/22   [provider]  ondansetron (ZOFRAN-ODT) 8 MG disintegrating tablet Take 1 tablet (8 mg total) by mouth every 8 (eight) hours as needed for nausea or vomiting. 10/24/22   Benjiman Core, MD  potassium chloride 20 MEQ TBCR Take 1 tablet (20 mEq total) by mouth daily. 10/30/22   Zadie Rhine, MD      Allergies    Nsaids, Carbamazepine, and Lavender oil    Review of Systems   Review of Systems  Constitutional: Negative.   HENT:  Negative.    Respiratory: Negative.    Gastrointestinal: Negative.   Musculoskeletal:  Positive for back pain. Negative for arthralgias, gait problem, joint swelling, myalgias, neck pain and neck stiffness.  Skin: Negative.   Neurological:  Negative for tremors, seizures, syncope, facial asymmetry, speech difficulty, weakness, light-headedness, numbness and headaches.    Physical Exam Updated Vital Signs BP 112/63   Pulse 90   Temp 99.8 F (37.7 C) (Oral)   Resp 18   Wt 59 kg   LMP 10/22/2022   SpO2 98%   BMI 23.79 kg/m  Physical Exam Vitals and nursing note reviewed.  Constitutional:      Appearance: She is not  ill-appearing or toxic-appearing.  HENT:     Head: Normocephalic and atraumatic.     Mouth/Throat:     Mouth: Mucous membranes are moist.     Pharynx: No oropharyngeal exudate or posterior oropharyngeal erythema.  Eyes:     General: No scleral icterus.       Right eye: No discharge.        Left eye: No discharge.     Conjunctiva/sclera: Conjunctivae normal.  Cardiovascular:     Rate and Rhythm: Normal rate and regular rhythm.     Pulses: Normal pulses.     Heart sounds: Normal heart sounds. No murmur heard. Pulmonary:     Effort: Pulmonary effort is normal. No respiratory distress.     Breath sounds: Normal breath sounds. No wheezing or rales.  Abdominal:     General: There is no distension.     Tenderness: There is no abdominal tenderness. There is no right CVA tenderness, left CVA tenderness, guarding or rebound.  Musculoskeletal:        General: No deformity.     Cervical back: Neck supple.     Right lower leg: No edema.     Left lower leg: No edema.  Skin:    General: Skin is warm and dry.     Capillary Refill: Capillary refill takes less than 2 seconds.  Neurological:     General: No focal deficit present.     Mental Status: She is alert. Mental status is at baseline.     Sensory: Sensation is intact.     Motor: Motor function is intact.     Gait: Gait is intact.     Comments: Cystometric sensation in lower extremities bilaterally, symmetric strength in plantar and dorsiflexion bilaterally.  Patient ambulatory in the emergency department without difficulty.  Psychiatric:        Mood and Affect: Mood normal.     ED Results / Procedures / Treatments   Labs (all labs ordered are listed, but only abnormal results are displayed) Labs Reviewed  URINALYSIS, ROUTINE W REFLEX MICROSCOPIC - Abnormal; Notable for the following components:      Result Value   Color, Urine COLORLESS (*)    Specific Gravity, Urine <1.005 (*)    All other components within normal limits  CBC  WITH DIFFERENTIAL/PLATELET - Abnormal; Notable for the following components:   WBC 13.6 (*)    MCV 100.3 (*)    MCH 35.6 (*)    Platelets 410 (*)    Neutro Abs 8.7 (*)    All other components within normal limits  BASIC METABOLIC PANEL - Abnormal; Notable for the following components:   BUN 5 (*)    All other components within normal limits  PREGNANCY, URINE    EKG None  Radiology No results found.  Procedures Procedures  Medications Ordered in ED Medications  ketorolac (TORADOL) 15 MG/ML injection 15 mg (15 mg Intravenous Given 11/12/22 2222)    ED Course/ Medical Decision Making/ A&P                             Medical Decision Making 37 year old female with chronic low back pain presents today with low back pain  Vital signs normal on intake.  Neurologic exam is nonfocal.  Patient with bilateral lumbar paraspinous musculature tenderness to palpation, no midline tenderness palpation.  Patient ambulatory in the emergency department.  Seen resting comfortably in the bed talking with her child at time of my arrival to the bedside, once I entered the room demeanor changed notably, tearful.  The emergent differential diagnosis for low back pain includes acute ligamentous and muscular injury, cord compression syndrome, pathologic fracture, transverse myelitis, vertebral osteomyelitis, discitis, and epidural abscess.   Amount and/or Complexity of Data Reviewed Labs: ordered.    Details: UA without evidence of infection.  Urine pregnancy negative. CBC with leukocytosis of 13, at patient's baseline, BMP unremarkable.   Risk Prescription drug management.   Clinically, Doubt acute infectious etiology this evening.  No evidence of acute traumatic injury, patient is afebrile and states that her pain is consistent with her chronic pain.  No midline tenderness palpation and no neurologic findings on exam. Will d/c with steroid burst for symptoms of lumbar radiculopathy. Clinical  concern for emergent underlying etiology that would warrant further ED workup or inpatient management is exceedingly low.   Laura Montoya  voiced understanding of her medical evaluation and treatment plan. Each of their questions answered to their expressed satisfaction.  Return precautions were given.  Patient is well-appearing, stable, and was discharged in good condition.  This chart was dictated using voice recognition software, Dragon. Despite the best efforts of this provider to proofread and correct errors, errors may still occur which can change documentation meaning.    Final Clinical Impression(s) / ED Diagnoses Final diagnoses:  Chronic bilateral low back pain with bilateral sciatica    Rx / DC Orders ED Discharge Orders          Ordered    predniSONE (DELTASONE) 50 MG tablet  Daily with breakfast        11/12/22 2231              Annisa Mazzarella, Idelia Salm 11/12/22 2307    Benjiman Core, MD 11/13/22 1452

## 2022-11-12 NOTE — ED Triage Notes (Signed)
Pt in with reported "sciatica pain" to L side, also reporting L flank pain. Pt has hx of sciatica, had SI joint injection in June, states this current pain began yesterday. Temp 99.8, ambulatory into triage

## 2022-11-12 NOTE — Discharge Instructions (Signed)
You were seen in the ER today for your back pain. Your labs were reassuring as was your exam. Please follow up with your spine specialist and pain management as previously scheduled and return to the ER with any new severe symptoms.

## 2022-11-21 ENCOUNTER — Emergency Department (HOSPITAL_COMMUNITY)
Admission: EM | Admit: 2022-11-21 | Discharge: 2022-11-22 | Disposition: A | Discharge status: Home / Self Care | Payer: MEDICAID | Attending: Emergency Medicine | Admitting: Emergency Medicine

## 2022-11-21 ENCOUNTER — Emergency Department (HOSPITAL_COMMUNITY): Payer: MEDICAID

## 2022-11-21 ENCOUNTER — Encounter (HOSPITAL_COMMUNITY): Payer: Self-pay

## 2022-11-21 ENCOUNTER — Other Ambulatory Visit: Payer: Self-pay

## 2022-11-21 DIAGNOSIS — Z79899 Other long term (current) drug therapy: Secondary | ICD-10-CM | POA: Insufficient documentation

## 2022-11-21 DIAGNOSIS — R110 Nausea: Secondary | ICD-10-CM | POA: Diagnosis not present

## 2022-11-21 DIAGNOSIS — M5442 Lumbago with sciatica, left side: Secondary | ICD-10-CM

## 2022-11-21 DIAGNOSIS — M545 Low back pain, unspecified: Secondary | ICD-10-CM | POA: Diagnosis present

## 2022-11-21 LAB — HCG, SERUM, QUALITATIVE: Preg, Serum: NEGATIVE

## 2022-11-21 MED ORDER — OXYCODONE-ACETAMINOPHEN 5-325 MG PO TABS
1.0000 | ORAL_TABLET | Freq: Once | ORAL | Status: AC
  Administered 2022-11-21: 1 via ORAL
  Filled 2022-11-21: qty 1

## 2022-11-21 NOTE — ED Triage Notes (Signed)
Pt arrives with c/o lower back pain about 2 weeks ago. Pt has hx of sciatica.

## 2022-11-21 NOTE — ED Provider Triage Note (Signed)
Emergency Medicine Provider Triage Evaluation Note  Laura Montoya , a 37 y.o. female  was evaluated in triage.  Pt complains of lumbar back pain.  Patient states she has chronic back pain from her rheumatoid arthritis however over the past few days the pain has gotten worse.  Patient denies saddle anesthesia, urinary/bowel incontinence, change in sensation/motor skills.  Patient does note that while sitting down makes her pain more in the standing up alleviates it.  Patient denies any trauma or abdominal pain or fevers, IVDU.  Review of Systems  Positive: See HPI Negative: See HPI  Physical Exam  BP 129/84 (BP Location: Right Arm)   Pulse 91   Temp 98.3 F (36.8 C) (Oral)   Resp 16   Wt 59 kg   LMP 11/20/2022   SpO2 100%   BMI 23.78 kg/m  Gen:   Awake, no distress   Resp:  Normal effort  MSK:   Moves extremities without difficulty  Other:  Tenderness to palpation in the midline of lumbar region without abnormalities palpated, able to bear weight and walk but leaning forward, sensation intact distally, 2+ bilateral dorsalis pedal pulses with regular rate  Medical Decision Making  Medically screening exam initiated at 7:13 PM.  Appropriate orders placed.  Laura Montoya was informed that the remainder of the evaluation will be completed by another provider, this initial triage assessment does not replace that evaluation, and the importance of remaining in the ED until their evaluation is complete.  Workup initiated, patient stable at this time   Remi Deter 11/21/22 1914

## 2022-11-22 MED ORDER — LIDOCAINE 5 % EX PTCH: 1.0000 | MEDICATED_PATCH | CUTANEOUS | 0 refills | Status: AC

## 2022-11-22 MED ORDER — ONDANSETRON 4 MG PO TBDP
4.0000 mg | ORAL_TABLET | Freq: Once | ORAL | Status: AC
  Administered 2022-11-22: 4 mg via ORAL
  Filled 2022-11-22: qty 1

## 2022-11-22 MED ORDER — OXYCODONE-ACETAMINOPHEN 5-325 MG PO TABS
1.0000 | ORAL_TABLET | Freq: Once | ORAL | Status: AC
  Administered 2022-11-22: 1 via ORAL
  Filled 2022-11-22: qty 1

## 2022-11-22 NOTE — Discharge Instructions (Addendum)
Please use Tylenol or ibuprofen for pain.  You may use 600 mg ibuprofen every 6 hours or 1000 mg of Tylenol every 6 hours.  You may choose to alternate between the 2.  This would be most effective.  Not to exceed 4 g of Tylenol within 24 hours.  Not to exceed 3200 mg ibuprofen 24 hours.  In addition to the above in your home muscle relaxant you can use the lidocaine patches that prescribed directly where you are having pain in the back.  Please follow-up with your pain clinic for further evaluation and management.

## 2022-11-22 NOTE — ED Provider Notes (Signed)
[place] EMERGENCY DEPARTMENT AT [facility] Provider Note   CSN: [id number] Arrival date & time: 11/21/22  1810     History  Chief Complaint  Patient presents with   Back Pain    Laura Montoya is a 37 y.o. female with past medical history significant for lumbar spondylosis with myelopathy, sacroiliitis, schizophrenia, chronic pain disorder who presents with concern for lower back pain for around 2 weeks.  Patient reports that she recently completed a course of steroids secondary to the pain with some relief.  She is taking naproxen, Tylenol, and Flexeril at home.  She reports no muscle relaxants since yesterday.  She reports that she did have some improvement of the pain with narcotic pain medication administered in the emergency department but did have some nausea associated with it.  Patient reports that she has a follow-up appointment with her pain clinic on Wednesday.  She denies any IV drug use, chronic corticosteroid use, history of cancer, she denies any groin numbness, tingling, recent falls, loss of control of bladder or bowels.   Back Pain      Home Medications Prior to Admission medications   Medication Sig Start Date End Date Taking? Authorizing Provider  lidocaine (LIDODERM) 5 % Place 1 patch onto the skin daily. Remove & Discard patch within 12 hours or as directed by MD 11/22/22  Yes Tisheena Maguire H, PA-C  albuterol (PROVENTIL HFA;VENTOLIN HFA) 108 (90 Base) MCG/ACT inhaler Inhale 1-2 puffs into the lungs every 6 (six) hours as needed for wheezing or shortness of breath.    [provider]  Azelastine HCl 137 MCG/SPRAY SOLN Place 2 sprays into both nostrils 2 (two) times daily as needed for allergies. 04/17/21   [provider]  buPROPion ER (WELLBUTRIN SR) 100 MG 12 hr tablet Take 1 tablet (100 mg total) by mouth daily. 08/31/22 09/30/22  Jeannie Fend, PA-C  busPIRone (BUSPAR) 7.5 MG tablet Take 1 tablet (7.5 mg total) by mouth 2  (two) times daily. 08/31/22   Jeannie Fend, PA-C  Cyanocobalamin (B-12) 2500 MCG TABS Take 1 tablet by mouth daily.    [provider]  DULoxetine (CYMBALTA) 30 MG capsule Take 1 capsule (30 mg total) by mouth daily. 06/20/22   Nita Sickle K, DO  folic acid (FOLVITE) 1 MG tablet Take 1 mg by mouth daily. 06/27/21   [provider]  hydroxychloroquine (PLAQUENIL) 200 MG tablet Take 200 mg by mouth at bedtime.    [provider]  methotrexate (RHEUMATREX) 2.5 MG tablet Take 12.5 mg by mouth every Sunday. (5 tablets) 07/19/21   [provider]  metoprolol succinate (TOPROL-XL) 25 MG 24 hr tablet Take 1 tablet by mouth daily. 05/10/22   [provider]  ondansetron (ZOFRAN-ODT) 8 MG disintegrating tablet Take 1 tablet (8 mg total) by mouth every 8 (eight) hours as needed for nausea or vomiting. 10/24/22   Benjiman Core, MD  potassium chloride 20 MEQ TBCR Take 1 tablet (20 mEq total) by mouth daily. 10/30/22   Zadie Rhine, MD      Allergies    Nsaids, Carbamazepine, and Lavender oil    Review of Systems   Review of Systems  Musculoskeletal:  Positive for back pain.  All other systems reviewed and are negative.   Physical Exam Updated Vital Signs BP 135/88 (BP Location: Left Arm)   Pulse 96   Temp 98.3 F (36.8 C) (Oral)   Resp 20   Wt 59 kg  LMP 11/20/2022   SpO2 100%   BMI 23.78 kg/m  Physical Exam Vitals and nursing note reviewed.  Constitutional:      General: She is not in acute distress.    Appearance: Normal appearance.  HENT:     Head: Normocephalic and atraumatic.  Eyes:     General:        Right eye: No discharge.        Left eye: No discharge.  Cardiovascular:     Rate and Rhythm: Normal rate and regular rhythm.     Pulses: Normal pulses.     Heart sounds: No murmur heard.    No friction rub. No gallop.  Pulmonary:     Effort: Pulmonary effort is normal.     Breath sounds: Normal breath sounds.  Abdominal:      General: Bowel sounds are normal.     Palpations: Abdomen is soft.  Musculoskeletal:     Comments: Patient with some focal tenderness to palpation in the lumbar paraspinous muscles, with intact strength 5/5 bilateral upper and lower extremities.  She has some tingling in the left lower extremity with straight leg raise, no significant worsening of symptoms with right sided straight leg raise.  Skin:    General: Skin is warm and dry.     Capillary Refill: Capillary refill takes less than 2 seconds.  Neurological:     Mental Status: She is alert and oriented to person, place, and time.  Psychiatric:        Mood and Affect: Mood normal.        Behavior: Behavior normal.     ED Results / Procedures / Treatments   Labs (all labs ordered are listed, but only abnormal results are displayed) Labs Reviewed  HCG, SERUM, QUALITATIVE    EKG None  Radiology CT Lumbar Spine Wo Contrast  Result Date: 11/21/2022 CLINICAL DATA:  Severe lumbar pain EXAM: CT LUMBAR SPINE WITHOUT CONTRAST TECHNIQUE: Multidetector CT imaging of the lumbar spine was performed without intravenous contrast administration. Multiplanar CT image reconstructions were also generated. RADIATION DOSE REDUCTION: This exam was performed according to the departmental dose-optimization program which includes automated exposure control, adjustment of the mA and/or kV according to patient size and/or use of iterative reconstruction technique. COMPARISON:  None Available. FINDINGS: Segmentation: 5 lumbar type vertebrae. Alignment: Normal. Vertebrae: No acute fracture or focal pathologic process. Paraspinal and other soft tissues: Paraspinal soft tissues are unremarkable. Visualized retroperitoneal structures appear normal. Disc levels: At L1-2 and L2-3 there is no significant spondylosis or facet hypertrophy. At L3-4 and L4-5 there is mild circumferential disc bulge with symmetrical neural foraminal encroachment. At L5-S1 there is  circumferential disc bulge slightly eccentric to the right. This results in minimal right neural foraminal encroachment. Reconstructed images demonstrate no additional findings. IMPRESSION: 1. Mild lower lumbar degenerative change as above. 2. No acute fracture. Electronically Signed   By: Sharlet Salina M.D.   On: 11/21/2022 21:36    Procedures Procedures    Medications Ordered in ED Medications  oxyCODONE-acetaminophen (PERCOCET/ROXICET) 5-325 MG per tablet 1 tablet (has no administration in time range)  ondansetron (ZOFRAN-ODT) disintegrating tablet 4 mg (has no administration in time range)  oxyCODONE-acetaminophen (PERCOCET/ROXICET) 5-325 MG per tablet 1 tablet (1 tablet Oral Given 11/21/22 1919)    ED Course/ Medical Decision Making/ A&P                             Medical  Decision Making  Patient with back pain.  My emergent differential diagnosis includes slipped disc, compression fracture, spondylolisthesis, less clinical concern for epidural abscess or osteomyelitis based on patient history. Overall with high clinical suspicion for acute on chronic lumbar strain, sciatica. No neurological deficits. Patient is ambulatory. No warning symptoms of back pain including: fecal incontinence, urinary retention or overflow incontinence, night sweats, waking from sleep with back pain, unexplained fevers or weight loss, h/o cancer, IVDU, recent trauma. No concern for cauda equina, epidural abscess, or other serious cause of back pain. I independently interpreted imaging including ct lumbar spine which shows degenerative changes in lumbar spinal region without acute fracture, listhesis or canal narrowing. I agree with the radiologist interpretation.  Conservative measures such as rest, ice/heat, ibuprofen, Tylenol, home flexeril, and lidocaine patches indicated with orthopedic follow-up if no improvement with conservative management.  Extensive return precautions given, patient discharged in stable  condition at this time.  Final Clinical Impression(s) / ED Diagnoses Final diagnoses:  Acute bilateral low back pain with left-sided sciatica    Rx / DC Orders ED Discharge Orders          Ordered    lidocaine (LIDODERM) 5 %  Every 24 hours        11/22/22 0128              Olene Floss, PA-C 11/22/22 0134    Tilden Fossa, MD 11/22/22 [phone]

## 2022-11-25 ENCOUNTER — Emergency Department (HOSPITAL_BASED_OUTPATIENT_CLINIC_OR_DEPARTMENT_OTHER): Payer: MEDICAID

## 2022-11-25 ENCOUNTER — Emergency Department (HOSPITAL_BASED_OUTPATIENT_CLINIC_OR_DEPARTMENT_OTHER)
Admission: EM | Admit: 2022-11-25 | Discharge: 2022-11-26 | Disposition: A | Discharge status: Home / Self Care | Payer: MEDICAID | Attending: Emergency Medicine | Admitting: Emergency Medicine

## 2022-11-25 ENCOUNTER — Other Ambulatory Visit: Payer: Self-pay

## 2022-11-25 DIAGNOSIS — R110 Nausea: Secondary | ICD-10-CM

## 2022-11-25 DIAGNOSIS — E871 Hypo-osmolality and hyponatremia: Secondary | ICD-10-CM

## 2022-11-25 DIAGNOSIS — J45909 Unspecified asthma, uncomplicated: Secondary | ICD-10-CM | POA: Insufficient documentation

## 2022-11-25 DIAGNOSIS — R7309 Other abnormal glucose: Secondary | ICD-10-CM

## 2022-11-25 DIAGNOSIS — D75839 Thrombocytosis, unspecified: Secondary | ICD-10-CM

## 2022-11-25 DIAGNOSIS — M545 Low back pain, unspecified: Secondary | ICD-10-CM | POA: Diagnosis not present

## 2022-11-25 DIAGNOSIS — R109 Unspecified abdominal pain: Secondary | ICD-10-CM | POA: Diagnosis not present

## 2022-11-25 DIAGNOSIS — R739 Hyperglycemia, unspecified: Secondary | ICD-10-CM

## 2022-11-25 DIAGNOSIS — Z1152 Encounter for screening for COVID-19: Secondary | ICD-10-CM | POA: Diagnosis not present

## 2022-11-25 LAB — RESP PANEL BY RT-PCR (RSV, FLU A&B, COVID)  RVPGX2
Influenza A by PCR: NEGATIVE
Influenza B by PCR: NEGATIVE
Resp Syncytial Virus by PCR: NEGATIVE
SARS Coronavirus 2 by RT PCR: NEGATIVE

## 2022-11-25 LAB — URINALYSIS, ROUTINE W REFLEX MICROSCOPIC
Bilirubin Urine: NEGATIVE
Glucose, UA: NEGATIVE mg/dL
Ketones, ur: 15 mg/dL — AB
Leukocytes,Ua: NEGATIVE
Nitrite: NEGATIVE
Specific Gravity, Urine: 1.009 (ref 1.005–1.030)
pH: 6 (ref 5.0–8.0)

## 2022-11-25 LAB — CBC WITH DIFFERENTIAL/PLATELET
Abs Immature Granulocytes: 0.05 10*3/uL (ref 0.00–0.07)
Basophils Absolute: 0.1 10*3/uL (ref 0.0–0.1)
Basophils Relative: 1 %
Eosinophils Absolute: 0 10*3/uL (ref 0.0–0.5)
Eosinophils Relative: 0 %
HCT: 42.7 % (ref 36.0–46.0)
Hemoglobin: 15.8 g/dL — ABNORMAL HIGH (ref 12.0–15.0)
Immature Granulocytes: 0 %
Lymphocytes Relative: 16 %
Lymphs Abs: 1.8 10*3/uL (ref 0.7–4.0)
MCH: 35 pg — ABNORMAL HIGH (ref 26.0–34.0)
MCHC: 37 g/dL — ABNORMAL HIGH (ref 30.0–36.0)
MCV: 94.5 fL (ref 80.0–100.0)
Monocytes Absolute: 0.8 10*3/uL (ref 0.1–1.0)
Monocytes Relative: 7 %
Neutro Abs: 8.6 10*3/uL — ABNORMAL HIGH (ref 1.7–7.7)
Neutrophils Relative %: 76 %
Platelets: 428 10*3/uL — ABNORMAL HIGH (ref 150–400)
RBC: 4.52 MIL/uL (ref 3.87–5.11)
RDW: 13.3 % (ref 11.5–15.5)
WBC: 11.3 10*3/uL — ABNORMAL HIGH (ref 4.0–10.5)
nRBC: 0 % (ref 0.0–0.2)

## 2022-11-25 LAB — COMPREHENSIVE METABOLIC PANEL
ALT: 9 U/L (ref 0–44)
AST: 17 U/L (ref 15–41)
Albumin: 4.2 g/dL (ref 3.5–5.0)
Alkaline Phosphatase: 61 U/L (ref 38–126)
Anion gap: 12 (ref 5–15)
BUN: 5 mg/dL — ABNORMAL LOW (ref 6–20)
CO2: 27 mmol/L (ref 22–32)
Calcium: 9.8 mg/dL (ref 8.9–10.3)
Chloride: 95 mmol/L — ABNORMAL LOW (ref 98–111)
Creatinine, Ser: 0.76 mg/dL (ref 0.44–1.00)
GFR, Estimated: >60 mL/min (ref >60)
Glucose, Bld: 128 mg/dL — ABNORMAL HIGH (ref 70–99)
Potassium: 3.6 mmol/L (ref 3.5–5.1)
Sodium: 134 mmol/L — ABNORMAL LOW (ref 135–145)
Total Bilirubin: 0.5 mg/dL (ref 0.3–1.2)
Total Protein: 6.9 g/dL (ref 6.5–8.1)

## 2022-11-25 LAB — LIPASE, BLOOD: Lipase: <10 U/L — ABNORMAL LOW (ref 11–51)

## 2022-11-25 MED ORDER — PROCHLORPERAZINE EDISYLATE 10 MG/2ML IJ SOLN
10.0000 mg | Freq: Once | INTRAMUSCULAR | Status: AC
  Administered 2022-11-25: 10 mg via INTRAVENOUS
  Filled 2022-11-25: qty 2

## 2022-11-25 MED ORDER — HALOPERIDOL LACTATE 5 MG/ML IJ SOLN
2.0000 mg | Freq: Once | INTRAMUSCULAR | Status: AC
  Administered 2022-11-25: 2 mg via INTRAVENOUS
  Filled 2022-11-25: qty 1

## 2022-11-25 MED ORDER — SODIUM CHLORIDE 0.9 % IV BOLUS
1000.0000 mL | Freq: Once | INTRAVENOUS | Status: AC
  Administered 2022-11-25: 1000 mL via INTRAVENOUS

## 2022-11-25 MED ORDER — DIPHENHYDRAMINE HCL 50 MG/ML IJ SOLN
12.5000 mg | Freq: Once | INTRAMUSCULAR | Status: AC
  Administered 2022-11-25: 12.5 mg via INTRAVENOUS
  Filled 2022-11-25: qty 1

## 2022-11-25 NOTE — ED Provider Notes (Signed)
[place] EMERGENCY DEPARTMENT AT [facility] Provider Note   CSN: [id number] Arrival date & time: 11/25/22  1954     History  Chief Complaint  Patient presents with   Back Pain   Nausea    Laura Montoya is a 37 y.o. female.  Patient here with lower back pain abdominal pain with nausea.  Denies any chest pain or shortness of breath.  History of chronic back pain.  Had a CT scan a few weeks ago was unremarkable.  She not having any pain down her legs.  She not have any numbness or weakness or tingling.  Her main concern is her nausea.  She has a history of asthma, schizophrenia, bipolar.  Does appear to use marijuana.  Denies any headache fever chills or pain with urination.  The history is provided by the patient.       Home Medications Prior to Admission medications   Medication Sig Start Date End Date Taking? Authorizing Provider  albuterol (PROVENTIL HFA;VENTOLIN HFA) 108 (90 Base) MCG/ACT inhaler Inhale 1-2 puffs into the lungs every 6 (six) hours as needed for wheezing or shortness of breath.    [provider]  Azelastine HCl 137 MCG/SPRAY SOLN Place 2 sprays into both nostrils 2 (two) times daily as needed for allergies. 04/17/21   [provider]  buPROPion ER (WELLBUTRIN SR) 100 MG 12 hr tablet Take 1 tablet (100 mg total) by mouth daily. 08/31/22 09/30/22  Jeannie Fend, PA-C  busPIRone (BUSPAR) 7.5 MG tablet Take 1 tablet (7.5 mg total) by mouth 2 (two) times daily. 08/31/22   Jeannie Fend, PA-C  Cyanocobalamin (B-12) 2500 MCG TABS Take 1 tablet by mouth daily.    [provider]  DULoxetine (CYMBALTA) 30 MG capsule Take 1 capsule (30 mg total) by mouth daily. 06/20/22   Nita Sickle K, DO  folic acid (FOLVITE) 1 MG tablet Take 1 mg by mouth daily. 06/27/21   [provider]  hydroxychloroquine (PLAQUENIL) 200 MG tablet Take 200 mg by mouth at bedtime.    [provider]  lidocaine (LIDODERM) 5 % Place 1 patch onto  the skin daily. Remove & Discard patch within 12 hours or as directed by MD 11/22/22   Prosperi, Ephriam Knuckles H, PA-C  methotrexate (RHEUMATREX) 2.5 MG tablet Take 12.5 mg by mouth every Sunday. (5 tablets) 07/19/21   [provider]  metoprolol succinate (TOPROL-XL) 25 MG 24 hr tablet Take 1 tablet by mouth daily. 05/10/22   [provider]  ondansetron (ZOFRAN-ODT) 8 MG disintegrating tablet Take 1 tablet (8 mg total) by mouth every 8 (eight) hours as needed for nausea or vomiting. 10/24/22   Benjiman Core, MD  potassium chloride 20 MEQ TBCR Take 1 tablet (20 mEq total) by mouth daily. 10/30/22   Zadie Rhine, MD      Allergies    Nsaids, Carbamazepine, and Lavender oil    Review of Systems   Review of Systems  Physical Exam Updated Vital Signs BP (!) 144/93   Pulse (!) 114   Temp 98.8 F (37.1 C) (Oral)   Resp 20   Ht 5\' 2"  (1.575 m)   Wt 59 kg   LMP 11/20/2022   SpO2 100%   BMI 23.78 kg/m  Physical Exam Vitals and nursing note reviewed.  Constitutional:      General: She is not in acute distress.    Appearance: She is well-developed. She is not ill-appearing.  HENT:     Head:  Normocephalic and atraumatic.     Nose: Nose normal.     Mouth/Throat:     Mouth: Mucous membranes are moist.  Eyes:     Extraocular Movements: Extraocular movements intact.     Conjunctiva/sclera: Conjunctivae normal.     Pupils: Pupils are equal, round, and reactive to light.  Cardiovascular:     Rate and Rhythm: Normal rate and regular rhythm.     Pulses: Normal pulses.     Heart sounds: Normal heart sounds. No murmur heard. Pulmonary:     Effort: Pulmonary effort is normal. No respiratory distress.     Breath sounds: Normal breath sounds.  Abdominal:     Palpations: Abdomen is soft.     Tenderness: There is abdominal tenderness.  Musculoskeletal:        General: No swelling.     Cervical back: Normal range of motion and neck supple.  Skin:    General: Skin is warm and  dry.     Capillary Refill: Capillary refill takes less than 2 seconds.  Neurological:     General: No focal deficit present.     Mental Status: She is alert and oriented to person, place, and time.     Cranial Nerves: No cranial nerve deficit.     Sensory: No sensory deficit.     Motor: No weakness.     Coordination: Coordination normal.  Psychiatric:        Mood and Affect: Mood normal.     ED Results / Procedures / Treatments   Labs (all labs ordered are listed, but only abnormal results are displayed) Labs Reviewed  CBC WITH DIFFERENTIAL/PLATELET - Abnormal; Notable for the following components:      Result Value   WBC 11.3 (*)    Hemoglobin 15.8 (*)    MCH 35.0 (*)    MCHC 37.0 (*)    Platelets 428 (*)    Neutro Abs 8.6 (*)    All other components within normal limits  COMPREHENSIVE METABOLIC PANEL - Abnormal; Notable for the following components:   Sodium 134 (*)    Chloride 95 (*)    Glucose, Bld 128 (*)    BUN 5 (*)    All other components within normal limits  LIPASE, BLOOD - Abnormal; Notable for the following components:   Lipase <10 (*)    All other components within normal limits  RESP PANEL BY RT-PCR (RSV, FLU A&B, COVID)  [id number]  URINALYSIS, ROUTINE W REFLEX MICROSCOPIC    EKG EKG Interpretation Date/Time:  Friday November 25 2022 21:44:14 EDT Ventricular Rate:  66 PR Interval:  115 QRS Duration:  113 QT Interval:  402 QTC Calculation: 422 R Axis:   85  Text Interpretation: Sinus rhythm Borderline short PR interval P Confirmed by Virgina Norfolk [phone]) on 11/25/2022 9:49:24 PM  Radiology DG Chest Portable 1 View  Result Date: 11/25/2022 CLINICAL DATA:  pain EXAM: PORTABLE CHEST 1 VIEW COMPARISON:  Chest x-ray 11/23/2021, CT chest 08/18/2021 FINDINGS: The heart and mediastinal contours are within normal limits. No focal consolidation. Coarsened interstitial markings. No pleural effusion. No pneumothorax. No acute osseous abnormality. IMPRESSION: Findings  suggestive of viral bronchiolitis versus reactive airway disease. Electronically Signed   By: Tish Frederickson M.D.   On: 11/25/2022 21:51    Procedures Procedures    Medications Ordered in ED Medications  sodium chloride 0.9 % bolus 1,000 mL (1,000 mLs Intravenous New Bag/Given 11/25/22 2138)  prochlorperazine (COMPAZINE) injection 10 mg (10 mg Intravenous Given 11/25/22  2138)  diphenhydrAMINE (BENADRYL) injection 12.5 mg (12.5 mg Intravenous Given 11/25/22 2131)    ED Course/ Medical Decision Making/ A&P                                 Medical Decision Making Amount and/or Complexity of Data Reviewed Labs: ordered. Radiology: ordered.  Risk Prescription drug management.   [name] is here with back pain nausea abdominal pain.  Unremarkable vitals.  No fever.  History of bipolar schizophrenia chronic back pain.  Differential diagnosis could be GI bug versus marijuana hyperemesis versus chronic process chronic back pain seems less likely to be bowel obstruction, could be kidney stone not having any pain with urination and doubt UTI pyelonephritis.  Overall will get CBC, CMP, lipase, CT scan abdomen pelvis.  Will give IV fluids, IV Compazine Benadryl.  She is not having any symptoms suggest cauda equina.  She is neurovascular neuromuscular intact.  Not really complaining of back pain atThis time.  Her main concern is nausea.  EKG shows sinus rhythm.  No ischemic changes.  Doubt cardiac process including ACS.  Overall lab work is unremarkable.  No significant anemia, leukocytosis or electrolyte abnormality or kidney injury.  Patient pending urinalysis, CT scan abdomen pelvis.  Anticipate discharge home if workup is unremarkable.  Could be viral process or marijuana hyperemesis.  However please see oncoming ED providers note for further results, evaluation, disposition of the patient.  This chart was dictated using voice recognition software.  Despite best efforts to proofread,  errors  can occur which can change the documentation meaning.         Final Clinical Impression(s) / ED Diagnoses Final diagnoses:  Nausea    Rx / DC Orders ED Discharge Orders     None         Virgina Norfolk, DO 11/25/22 2249

## 2022-11-25 NOTE — ED Provider Notes (Signed)
Care assumed from Dr. Lockie Mola, patient with back pain and nausea and vomiting and essentially negative workup but difficult to control nausea.  She is getting IV fluids, antiemetics and will need to be reassessed.  She has been able to tolerate oral fluids.  She states that she had run out of promethazine suppository.  Discharging her with prescriptions for ondansetron oral dissolving tablet and promethazine suppository.   [name], MD 11/26/22 0040

## 2022-11-25 NOTE — ED Triage Notes (Signed)
Pt to ED c/o mid and lower back pain x 3 weeks, progressively getting worse. Reports hx of sciatica. Pt also c/o nausea.

## 2022-11-25 NOTE — ED Notes (Signed)
Pt unable to urinate at this time.  

## 2022-11-26 MED ORDER — PROMETHAZINE HCL 25 MG RE SUPP: 25.0000 mg | Freq: Four times a day (QID) | RECTAL | 0 refills | Status: DC | PRN

## 2022-11-26 MED ORDER — ONDANSETRON 8 MG PO TBDP: 8.0000 mg | ORAL_TABLET | Freq: Three times a day (TID) | ORAL | 0 refills | Status: DC | PRN

## 2022-11-26 NOTE — ED Notes (Signed)
 RN reviewed discharge instructions with pt. Pt verbalized understanding and had no further questions. VSS upon discharge.  

## 2022-11-26 NOTE — ED Notes (Signed)
Pt tolerating fluids at this time.  

## 2022-12-10 ENCOUNTER — Emergency Department (HOSPITAL_BASED_OUTPATIENT_CLINIC_OR_DEPARTMENT_OTHER)
Admission: EM | Admit: 2022-12-10 | Discharge: 2022-12-10 | Disposition: A | Discharge status: Home / Self Care | Payer: MEDICAID | Attending: Emergency Medicine | Admitting: Emergency Medicine

## 2022-12-10 ENCOUNTER — Encounter (HOSPITAL_BASED_OUTPATIENT_CLINIC_OR_DEPARTMENT_OTHER): Payer: Self-pay | Admitting: Emergency Medicine

## 2022-12-10 ENCOUNTER — Other Ambulatory Visit: Payer: Self-pay

## 2022-12-10 ENCOUNTER — Emergency Department (HOSPITAL_BASED_OUTPATIENT_CLINIC_OR_DEPARTMENT_OTHER): Payer: MEDICAID | Admitting: Radiology

## 2022-12-10 ENCOUNTER — Emergency Department (HOSPITAL_BASED_OUTPATIENT_CLINIC_OR_DEPARTMENT_OTHER): Payer: MEDICAID

## 2022-12-10 DIAGNOSIS — R079 Chest pain, unspecified: Secondary | ICD-10-CM | POA: Diagnosis not present

## 2022-12-10 DIAGNOSIS — R112 Nausea with vomiting, unspecified: Secondary | ICD-10-CM | POA: Insufficient documentation

## 2022-12-10 DIAGNOSIS — R1013 Epigastric pain: Secondary | ICD-10-CM | POA: Insufficient documentation

## 2022-12-10 DIAGNOSIS — J45909 Unspecified asthma, uncomplicated: Secondary | ICD-10-CM | POA: Insufficient documentation

## 2022-12-10 DIAGNOSIS — R197 Diarrhea, unspecified: Secondary | ICD-10-CM | POA: Insufficient documentation

## 2022-12-10 LAB — PREGNANCY, URINE: Preg Test, Ur: NEGATIVE

## 2022-12-10 LAB — URINALYSIS, ROUTINE W REFLEX MICROSCOPIC
Bilirubin Urine: NEGATIVE
Glucose, UA: NEGATIVE mg/dL
Hgb urine dipstick: NEGATIVE
Ketones, ur: NEGATIVE mg/dL
Leukocytes,Ua: NEGATIVE
Nitrite: NEGATIVE
Specific Gravity, Urine: >1.046 — ABNORMAL HIGH (ref 1.005–1.030)
pH: 7.5 (ref 5.0–8.0)

## 2022-12-10 LAB — BASIC METABOLIC PANEL
Anion gap: 16 — ABNORMAL HIGH (ref 5–15)
BUN: 7 mg/dL (ref 6–20)
CO2: 26 mmol/L (ref 22–32)
Calcium: 10 mg/dL (ref 8.9–10.3)
Chloride: 91 mmol/L — ABNORMAL LOW (ref 98–111)
Creatinine, Ser: 0.65 mg/dL (ref 0.44–1.00)
GFR, Estimated: >60 mL/min (ref >60)
Glucose, Bld: 95 mg/dL (ref 70–99)
Potassium: 3.2 mmol/L — ABNORMAL LOW (ref 3.5–5.1)
Sodium: 133 mmol/L — ABNORMAL LOW (ref 135–145)

## 2022-12-10 LAB — LIPASE, BLOOD: Lipase: <10 U/L — ABNORMAL LOW (ref 11–51)

## 2022-12-10 LAB — TROPONIN I (HIGH SENSITIVITY)
Troponin I (High Sensitivity): 6 ng/L (ref <18)
Troponin I (High Sensitivity): 6 ng/L (ref <18)

## 2022-12-10 LAB — HEPATIC FUNCTION PANEL
ALT: 8 U/L (ref 0–44)
AST: 17 U/L (ref 15–41)
Albumin: 4.6 g/dL (ref 3.5–5.0)
Alkaline Phosphatase: 64 U/L (ref 38–126)
Bilirubin, Direct: 0.1 mg/dL (ref 0.0–0.2)
Indirect Bilirubin: 0.7 mg/dL (ref 0.3–0.9)
Total Bilirubin: 0.8 mg/dL (ref 0.3–1.2)
Total Protein: 7.3 g/dL (ref 6.5–8.1)

## 2022-12-10 LAB — CBC
HCT: 42 % (ref 36.0–46.0)
Hemoglobin: 15.6 g/dL — ABNORMAL HIGH (ref 12.0–15.0)
MCH: 35.1 pg — ABNORMAL HIGH (ref 26.0–34.0)
MCHC: 37.1 g/dL — ABNORMAL HIGH (ref 30.0–36.0)
MCV: 94.4 fL (ref 80.0–100.0)
Platelets: 491 10*3/uL — ABNORMAL HIGH (ref 150–400)
RBC: 4.45 MIL/uL (ref 3.87–5.11)
RDW: 12.8 % (ref 11.5–15.5)
WBC: 11.2 10*3/uL — ABNORMAL HIGH (ref 4.0–10.5)
nRBC: 0 % (ref 0.0–0.2)

## 2022-12-10 LAB — HCG, QUANTITATIVE, PREGNANCY: hCG, Beta Chain, Quant, S: <1 m[IU]/mL (ref <5)

## 2022-12-10 MED ORDER — PANTOPRAZOLE SODIUM 20 MG PO TBEC: 40.0000 mg | DELAYED_RELEASE_TABLET | Freq: Every day | ORAL | 0 refills | Status: DC

## 2022-12-10 MED ORDER — IOHEXOL 350 MG/ML SOLN
100.0000 mL | Freq: Once | INTRAVENOUS | Status: AC | PRN
  Administered 2022-12-10: 100 mL via INTRAVENOUS

## 2022-12-10 MED ORDER — HYDROMORPHONE HCL 1 MG/ML IJ SOLN
1.0000 mg | Freq: Once | INTRAMUSCULAR | Status: AC
  Administered 2022-12-10: 1 mg via INTRAVENOUS
  Filled 2022-12-10: qty 1

## 2022-12-10 NOTE — ED Triage Notes (Signed)
Pt with n/v started last night as well as middle/lower back and chest pain. Pt does take metoprolol for hx high heart rate, was unable to take last night due to n/v.

## 2022-12-10 NOTE — ED Notes (Signed)
Patient transported to CT 

## 2022-12-10 NOTE — ED Notes (Signed)
 RN reviewed discharge instructions with pt. Pt verbalized understanding and had no further questions. VSS upon discharge.  

## 2022-12-10 NOTE — ED Provider Notes (Signed)
[place] EMERGENCY DE[place]MENT AT [facility] Provider Note   CSN: [id number] Arrival date & time: 12/10/22  1827     History {Add pertinent medical, surgical, social history, OB history to HPI:1} Chief Complaint  Patient presents with   Chest Pain    Laura Montoya is a 37 y.o. female.  HPI     Home Medications Prior to Admission medications   Medication Sig Start Date End Date Taking? Authorizing Provider  albuterol (PROVENTIL HFA;VENTOLIN HFA) 108 (90 Base) MCG/ACT inhaler Inhale 1-2 puffs into the lungs every 6 (six) hours as needed for wheezing or shortness of breath.    [provider]  Azelastine HCl 137 MCG/SPRAY SOLN Place 2 sprays into both nostrils 2 (two) times daily as needed for allergies. 04/17/21   [provider]  buPROPion ER (WELLBUTRIN SR) 100 MG 12 hr tablet Take 1 tablet (100 mg total) by mouth daily. 08/31/22 09/30/22  Jeannie Fend, PA-C  busPIRone (BUSPAR) 7.5 MG tablet Take 1 tablet (7.5 mg total) by mouth 2 (two) times daily. 08/31/22   Jeannie Fend, PA-C  Cyanocobalamin (B-12) 2500 MCG TABS Take 1 tablet by mouth daily.    [provider]  DULoxetine (CYMBALTA) 30 MG capsule Take 1 capsule (30 mg total) by mouth daily. 06/20/22   Nita Sickle K, DO  folic acid (FOLVITE) 1 MG tablet Take 1 mg by mouth daily. 06/27/21   [provider]  hydroxychloroquine (PLAQUENIL) 200 MG tablet Take 200 mg by mouth at bedtime.    [provider]  lidocaine (LIDODERM) 5 % Place 1 patch onto the skin daily. Remove & Discard patch within 12 hours or as directed by MD 11/22/22   Prosperi, Ephriam Knuckles H, PA-C  methotrexate (RHEUMATREX) 2.5 MG tablet Take 12.5 mg by mouth every Sunday. (5 tablets) 07/19/21   [provider]  metoprolol succinate (TOPROL-XL) 25 MG 24 hr tablet Take 1 tablet by mouth daily. 05/10/22   [provider]  ondansetron (ZOFRAN-ODT) 8 MG disintegrating tablet Take 1 tablet (8 mg  total) by mouth every 8 (eight) hours as needed for nausea or vomiting. 11/26/22   [name], MD  potassium chloride 20 MEQ TBCR Take 1 tablet (20 mEq total) by mouth daily. 10/30/22   Zadie Rhine, MD  promethazine (PHENERGAN) 25 MG suppository Place 1 suppository (25 mg total) rectally every 6 (six) hours as needed for nausea or vomiting. 11/26/22   [name], MD      Allergies    Nsaids, Carbamazepine, and Lavender oil    Review of Systems   Review of Systems  Physical Exam Updated Vital Signs BP (!) 113/97   Pulse 93   Temp 98.3 F (36.8 C) (Oral)   Resp 17   Wt 56.7 kg   LMP 11/20/2022   SpO2 100%   BMI 22.86 kg/m  Physical Exam  ED Results / Procedures / Treatments   Labs (all labs ordered are listed, but only abnormal results are displayed) Labs Reviewed  BASIC METABOLIC PANEL - Abnormal; Notable for the following components:      Result Value   Sodium 133 (*)    Potassium 3.2 (*)    Chloride 91 (*)    Anion gap 16 (*)    All other components within normal limits  CBC - Abnormal; Notable for the following components:   WBC 11.2 (*)    Hemoglobin 15.6 (*)    MCH 35.1 (*)    MCHC 37.1 (*)  Platelets 491 (*)    All other components within normal limits  LIPASE, BLOOD - Abnormal; Notable for the following components:   Lipase <10 (*)    All other components within normal limits  URINALYSIS, ROUTINE W REFLEX MICROSCOPIC - Abnormal; Notable for the following components:   Color, Urine COLORLESS (*)    Specific Gravity, Urine >1.046 (*)    Protein, ur TRACE (*)    All other components within normal limits  HEPATIC FUNCTION PANEL  PREGNANCY, URINE  HCG, QUANTITATIVE, PREGNANCY  TROPONIN I (HIGH SENSITIVITY)  TROPONIN I (HIGH SENSITIVITY)    EKG EKG Interpretation Date/Time:  Saturday December 10 2022 18:53:28 EDT Ventricular Rate:  110 PR Interval:  122 QRS Duration:  104 QT Interval:  340 QTC Calculation: 460 R Axis:   110  Text  Interpretation: Sinus tachycardia Biatrial enlargement Consider right ventricular hypertrophy Since prior ECGT earlier today, rate has slowed, decreased ST changes Confirmed by Alvira Monday ([id number]) on 12/10/2022 10:06:39 PM  Radiology CT Angio Chest/Abd/Pel for Dissection W and/or Wo Contrast  Result Date: 12/10/2022 CLINICAL DATA:  Acute aortic syndrome suspected, nausea and vomiting starting last night, middle and lower back and chest pain EXAM: CT ANGIOGRAPHY CHEST, ABDOMEN AND PELVIS TECHNIQUE: Non-contrast CT of the chest was initially obtained. Multidetector CT imaging through the chest, abdomen and pelvis was performed using the standard protocol during bolus administration of intravenous contrast. Multiplanar reconstructed images and MIPs were obtained and reviewed to evaluate the vascular anatomy. RADIATION DOSE REDUCTION: This exam was performed according to the departmental dose-optimization program which includes automated exposure control, adjustment of the mA and/or kV according to patient size and/or use of iterative reconstruction technique. CONTRAST:  OMNIPAQUE IOHEXOL 350 MG/ML SOLN COMPARISON:  CT abdomen pelvis, 11/25/2022 FINDINGS: CTA CHEST FINDINGS VASCULAR Aorta: Satisfactory opacification of the aorta. Normal contour and caliber of the thoracic aorta. No evidence of aneurysm, dissection, or other acute aortic pathology. Cardiovascular: No evidence of pulmonary embolism on limited non-tailored examination. Normal heart size. No pericardial effusion. Review of the MIP images confirms the above findings. NON VASCULAR Mediastinum/Nodes: No enlarged mediastinal, hilar, or axillary lymph nodes. Thyroid gland, trachea, and esophagus demonstrate no significant findings. Lungs/Pleura: Lungs are clear. No pleural effusion or pneumothorax. Musculoskeletal: No chest wall abnormality. No acute osseous findings. Review of the MIP images confirms the above findings. CTA ABDOMEN AND PELVIS  FINDINGS VASCULAR Normal contour and caliber of the abdominal aorta. No evidence of aneurysm, dissection, or other acute aortic pathology. Standard branching pattern of the abdominal aorta with solitary bilateral renal arteries. Review of the MIP images confirms the above findings. NON-VASCULAR Hepatobiliary: No solid liver abnormality is seen. No gallstones, gallbladder wall thickening, or biliary dilatation. Pancreas: Unremarkable. No pancreatic ductal dilatation or surrounding inflammatory changes. Spleen: Normal in size without significant abnormality. Adrenals/Urinary Tract: Adrenal glands are unremarkable. Kidneys are normal, without renal calculi, solid lesion, or hydronephrosis. Bladder is unremarkable. Stomach/Bowel: Stomach is within normal limits. Appendix appears normal. No evidence of bowel wall thickening, distention, or inflammatory changes. Lymphatic: No enlarged abdominal or pelvic lymph nodes. Reproductive: No mass or other significant abnormality. Other: No abdominal wall hernia or abnormality. No ascites. Musculoskeletal: No acute osseous findings. IMPRESSION: 1. Normal contour and caliber of the thoracic and abdominal aorta. No evidence of aneurysm, dissection, or other acute aortic pathology. No significant atherosclerosis. 2. No findings to explain pain. Electronically Signed   By: Jearld Lesch M.D.   On: 12/10/2022 20:05    Procedures  Procedures  {Document cardiac monitor, telemetry assessment procedure when appropriate:1}  Medications Ordered in ED Medications  HYDROmorphone (DILAUDID) injection 1 mg (1 mg Intravenous Given 12/10/22 1923)  iohexol (OMNIPAQUE) 350 MG/ML injection 100 mL (100 mLs Intravenous Contrast Given 12/10/22 1953)    ED Course/ Medical Decision Making/ A&P   {   Click here for ABCD2, HEART and other calculatorsREFRESH Note before signing :1}                              Medical Decision Making Amount and/or Complexity of Data Reviewed Labs:  ordered. Radiology: ordered. ECG/medicine tests: ordered.  Risk Prescription drug management.   ***  {Document critical care time when appropriate:1} {Document review of labs and clinical decision tools ie heart score, Chads2Vasc2 etc:1}  {Document your independent review of radiology images, and any outside records:1} {Document your discussion with family members, caretakers, and with consultants:1} {Document social determinants of health affecting pt's care:1} {Document your decision making why or why not admission, treatments were needed:1} Final Clinical Impression(s) / ED Diagnoses Final diagnoses:  None    Rx / DC Orders ED Discharge Orders     None

## 2022-12-11 ENCOUNTER — Emergency Department (HOSPITAL_COMMUNITY)
Admission: EM | Admit: 2022-12-11 | Discharge: 2022-12-12 | Disposition: A | Discharge status: Home / Self Care | Payer: MEDICAID | Attending: Emergency Medicine | Admitting: Emergency Medicine

## 2022-12-11 ENCOUNTER — Other Ambulatory Visit: Payer: Self-pay

## 2022-12-11 ENCOUNTER — Encounter (HOSPITAL_COMMUNITY): Payer: Self-pay

## 2022-12-11 ENCOUNTER — Emergency Department (HOSPITAL_COMMUNITY): Payer: MEDICAID

## 2022-12-11 DIAGNOSIS — Z1152 Encounter for screening for COVID-19: Secondary | ICD-10-CM | POA: Diagnosis not present

## 2022-12-11 DIAGNOSIS — R1013 Epigastric pain: Secondary | ICD-10-CM | POA: Insufficient documentation

## 2022-12-11 DIAGNOSIS — D72829 Elevated white blood cell count, unspecified: Secondary | ICD-10-CM | POA: Insufficient documentation

## 2022-12-11 DIAGNOSIS — R509 Fever, unspecified: Secondary | ICD-10-CM

## 2022-12-11 DIAGNOSIS — E876 Hypokalemia: Secondary | ICD-10-CM | POA: Insufficient documentation

## 2022-12-11 LAB — CBC
HCT: 42.2 % (ref 36.0–46.0)
Hemoglobin: 14.9 g/dL (ref 12.0–15.0)
MCH: 34.7 pg — ABNORMAL HIGH (ref 26.0–34.0)
MCHC: 35.3 g/dL (ref 30.0–36.0)
MCV: 98.4 fL (ref 80.0–100.0)
Platelets: 422 10*3/uL — ABNORMAL HIGH (ref 150–400)
RBC: 4.29 MIL/uL (ref 3.87–5.11)
RDW: 12.9 % (ref 11.5–15.5)
WBC: 13.5 10*3/uL — ABNORMAL HIGH (ref 4.0–10.5)
nRBC: 0 % (ref 0.0–0.2)

## 2022-12-11 LAB — TROPONIN I (HIGH SENSITIVITY)
Troponin I (High Sensitivity): 4 ng/L (ref <18)
Troponin I (High Sensitivity): 3 ng/L (ref <18)

## 2022-12-11 LAB — URINALYSIS, ROUTINE W REFLEX MICROSCOPIC
Bilirubin Urine: NEGATIVE
Glucose, UA: NEGATIVE mg/dL
Hgb urine dipstick: NEGATIVE
Ketones, ur: NEGATIVE mg/dL
Leukocytes,Ua: NEGATIVE
Nitrite: NEGATIVE
Protein, ur: NEGATIVE mg/dL
Specific Gravity, Urine: 1.002 — ABNORMAL LOW (ref 1.005–1.030)
pH: 7 (ref 5.0–8.0)

## 2022-12-11 LAB — COMPREHENSIVE METABOLIC PANEL
ALT: 11 U/L (ref 0–44)
AST: 20 U/L (ref 15–41)
Albumin: 4.1 g/dL (ref 3.5–5.0)
Alkaline Phosphatase: 68 U/L (ref 38–126)
Anion gap: 12 (ref 5–15)
BUN: 5 mg/dL — ABNORMAL LOW (ref 6–20)
CO2: 28 mmol/L (ref 22–32)
Calcium: 9.2 mg/dL (ref 8.9–10.3)
Chloride: 91 mmol/L — ABNORMAL LOW (ref 98–111)
Creatinine, Ser: 0.9 mg/dL (ref 0.44–1.00)
GFR, Estimated: >60 mL/min (ref >60)
Glucose, Bld: 98 mg/dL (ref 70–99)
Potassium: 3.1 mmol/L — ABNORMAL LOW (ref 3.5–5.1)
Sodium: 131 mmol/L — ABNORMAL LOW (ref 135–145)
Total Bilirubin: 0.8 mg/dL (ref 0.3–1.2)
Total Protein: 7 g/dL (ref 6.5–8.1)

## 2022-12-11 LAB — HCG, SERUM, QUALITATIVE: Preg, Serum: NEGATIVE

## 2022-12-11 LAB — LIPASE, BLOOD: Lipase: 23 U/L (ref 11–51)

## 2022-12-11 MED ORDER — MORPHINE SULFATE (PF) 4 MG/ML IV SOLN
4.0000 mg | Freq: Once | INTRAVENOUS | Status: AC
  Administered 2022-12-12: 4 mg via INTRAVENOUS
  Filled 2022-12-11: qty 1

## 2022-12-11 NOTE — ED Provider Notes (Signed)
[place] EMERGENCY DEPARTMENT AT [facility] Provider Note   CSN: [id number] Arrival date & time: 12/11/22  1507     History {Add pertinent medical, surgical, social history, OB history to HPI:1} Chief Complaint  Patient presents with   Chest Pain   Abdominal Pain   Nausea   Back Pain    Laura Montoya is a 37 y.o. female.  The history is provided by the patient and medical records.  Chest Pain Associated symptoms: abdominal pain and back pain   Abdominal Pain Associated symptoms: chest pain   Back Pain Associated symptoms: abdominal pain and chest pain   Laura Montoya is a 37 y.o. female who presents to the Emergency Department complaining of chest pain.  She presents to the ED for evaluation of central chest pain that radiates to her back.  Sxs started Thursday and are waxing an waning.  She had associated n/v/d, which has since resolved. Has a hx/o chronic back pain, schizophrenia, tachycardia.  She was seen in the ED last night for similar sxs with labs and CTA performed at that time.  She was recommended for transfer to obtain US to eval for gallbladder issues, but she declined at that time.       Home Medications Prior to Admission medications   Medication Sig Start Date End Date Taking? Authorizing Provider  albuterol (PROVENTIL HFA;VENTOLIN HFA) 108 (90 Base) MCG/ACT inhaler Inhale 1-2 puffs into the lungs every 6 (six) hours as needed for wheezing or shortness of breath.    [provider]  Azelastine HCl 137 MCG/SPRAY SOLN Place 2 sprays into both nostrils 2 (two) times daily as needed for allergies. 04/17/21   [provider]  buPROPion ER (WELLBUTRIN SR) 100 MG 12 hr tablet Take 1 tablet (100 mg total) by mouth daily. 08/31/22 09/30/22  Jeannie Fend, PA-C  busPIRone (BUSPAR) 7.5 MG tablet Take 1 tablet (7.5 mg total) by mouth 2 (two) times daily. 08/31/22   Jeannie Fend, PA-C  Cyanocobalamin (B-12) 2500 MCG TABS Take 1 tablet by  mouth daily.    [provider]  DULoxetine (CYMBALTA) 30 MG capsule Take 1 capsule (30 mg total) by mouth daily. 06/20/22   Nita Sickle K, DO  folic acid (FOLVITE) 1 MG tablet Take 1 mg by mouth daily. 06/27/21   [provider]  hydroxychloroquine (PLAQUENIL) 200 MG tablet Take 200 mg by mouth at bedtime.    [provider]  lidocaine (LIDODERM) 5 % Place 1 patch onto the skin daily. Remove & Discard patch within 12 hours or as directed by MD 11/22/22   Prosperi, Ephriam Knuckles H, PA-C  methotrexate (RHEUMATREX) 2.5 MG tablet Take 12.5 mg by mouth every Sunday. (5 tablets) 07/19/21   [provider]  metoprolol succinate (TOPROL-XL) 25 MG 24 hr tablet Take 1 tablet by mouth daily. 05/10/22   [provider]  ondansetron (ZOFRAN-ODT) 8 MG disintegrating tablet Take 1 tablet (8 mg total) by mouth every 8 (eight) hours as needed for nausea or vomiting. 11/26/22   [name], MD  pantoprazole (PROTONIX) 20 MG tablet Take 2 tablets (40 mg total) by mouth daily for 10 days. 12/10/22 12/20/22  Alvira Monday, MD  potassium chloride 20 MEQ TBCR Take 1 tablet (20 mEq total) by mouth daily. 10/30/22   Zadie Rhine, MD  promethazine (PHENERGAN) 25 MG suppository Place 1 suppository (25 mg total) rectally every 6 (six) hours as needed for nausea or vomiting. 11/26/22   Preston Fleeting,  Onalee Hua, MD      Allergies    Nsaids, Carbamazepine, and Lavender oil    Review of Systems   Review of Systems  Cardiovascular:  Positive for chest pain.  Gastrointestinal:  Positive for abdominal pain.  Musculoskeletal:  Positive for back pain.  All other systems reviewed and are negative.   Physical Exam Updated Vital Signs BP 113/76 (BP Location: Right Arm)   Pulse 79   Temp 99.4 F (37.4 C) (Oral)   Resp 16   Ht 5\' 2"  (1.575 m)   Wt 57.6 kg   LMP 11/20/2022   SpO2 98%   BMI 23.23 kg/m  Physical Exam Vitals and nursing note reviewed.  Constitutional:      Appearance: She is  well-developed.  HENT:     Head: Normocephalic and atraumatic.  Cardiovascular:     Rate and Rhythm: Normal rate and regular rhythm.     Heart sounds: No murmur heard. Pulmonary:     Effort: Pulmonary effort is normal. No respiratory distress.     Breath sounds: Normal breath sounds.  Abdominal:     Palpations: Abdomen is soft.     Tenderness: There is no guarding or rebound.     Comments: Mild epigastric, RUQ tenderness  Musculoskeletal:        General: No tenderness.  Skin:    General: Skin is warm and dry.  Neurological:     Mental Status: She is alert and oriented to person, place, and time.  Psychiatric:        Behavior: Behavior normal.     ED Results / Procedures / Treatments   Labs (all labs ordered are listed, but only abnormal results are displayed) Labs Reviewed  CBC - Abnormal; Notable for the following components:      Result Value   WBC 13.5 (*)    MCH 34.7 (*)    Platelets 422 (*)    All other components within normal limits  COMPREHENSIVE METABOLIC PANEL - Abnormal; Notable for the following components:   Sodium 131 (*)    Potassium 3.1 (*)    Chloride 91 (*)    BUN 5 (*)    All other components within normal limits  URINALYSIS, ROUTINE W REFLEX MICROSCOPIC - Abnormal; Notable for the following components:   Color, Urine STRAW (*)    Specific Gravity, Urine 1.002 (*)    All other components within normal limits  HCG, SERUM, QUALITATIVE  LIPASE, BLOOD  TROPONIN I (HIGH SENSITIVITY)  TROPONIN I (HIGH SENSITIVITY)    EKG EKG Interpretation Date/Time:  Sunday December 11 2022 15:26:07 EDT Ventricular Rate:  110 PR Interval:  120 QRS Duration:  100 QT Interval:  342 QTC Calculation: 462 R Axis:   169  Text Interpretation: Sinus tachycardia Biatrial enlargement Right axis deviation Pulmonary disease pattern Incomplete right bundle branch block Right ventricular hypertrophy Abnormal ECG Confirmed by Tilden Fossa [phone]) on 12/11/2022 11:30:55  PM  Radiology DG Chest 2 View  Result Date: 12/11/2022 CLINICAL DATA:  Chest pain. EXAM: CHEST - 2 VIEW COMPARISON:  11/25/2022 FINDINGS: Lungs are adequately inflated and otherwise clear. Cardiomediastinal silhouette and remainder of the exam is unchanged. IMPRESSION: No active cardiopulmonary disease. Electronically Signed   By: Elberta Fortis M.D.   On: 12/11/2022 16:58   CT Angio Chest/Abd/Pel for Dissection W and/or Wo Contrast  Result Date: 12/10/2022 CLINICAL DATA:  Acute aortic syndrome suspected, nausea and vomiting starting last night, middle and lower back and chest pain EXAM: CT ANGIOGRAPHY  CHEST, ABDOMEN AND PELVIS TECHNIQUE: Non-contrast CT of the chest was initially obtained. Multidetector CT imaging through the chest, abdomen and pelvis was performed using the standard protocol during bolus administration of intravenous contrast. Multiplanar reconstructed images and MIPs were obtained and reviewed to evaluate the vascular anatomy. RADIATION DOSE REDUCTION: This exam was performed according to the departmental dose-optimization program which includes automated exposure control, adjustment of the mA and/or kV according to patient size and/or use of iterative reconstruction technique. CONTRAST:  OMNIPAQUE IOHEXOL 350 MG/ML SOLN COMPARISON:  CT abdomen pelvis, 11/25/2022 FINDINGS: CTA CHEST FINDINGS VASCULAR Aorta: Satisfactory opacification of the aorta. Normal contour and caliber of the thoracic aorta. No evidence of aneurysm, dissection, or other acute aortic pathology. Cardiovascular: No evidence of pulmonary embolism on limited non-tailored examination. Normal heart size. No pericardial effusion. Review of the MIP images confirms the above findings. NON VASCULAR Mediastinum/Nodes: No enlarged mediastinal, hilar, or axillary lymph nodes. Thyroid gland, trachea, and esophagus demonstrate no significant findings. Lungs/Pleura: Lungs are clear. No pleural effusion or pneumothorax.  Musculoskeletal: No chest wall abnormality. No acute osseous findings. Review of the MIP images confirms the above findings. CTA ABDOMEN AND PELVIS FINDINGS VASCULAR Normal contour and caliber of the abdominal aorta. No evidence of aneurysm, dissection, or other acute aortic pathology. Standard branching pattern of the abdominal aorta with solitary bilateral renal arteries. Review of the MIP images confirms the above findings. NON-VASCULAR Hepatobiliary: No solid liver abnormality is seen. No gallstones, gallbladder wall thickening, or biliary dilatation. Pancreas: Unremarkable. No pancreatic ductal dilatation or surrounding inflammatory changes. Spleen: Normal in size without significant abnormality. Adrenals/Urinary Tract: Adrenal glands are unremarkable. Kidneys are normal, without renal calculi, solid lesion, or hydronephrosis. Bladder is unremarkable. Stomach/Bowel: Stomach is within normal limits. Appendix appears normal. No evidence of bowel wall thickening, distention, or inflammatory changes. Lymphatic: No enlarged abdominal or pelvic lymph nodes. Reproductive: No mass or other significant abnormality. Other: No abdominal wall hernia or abnormality. No ascites. Musculoskeletal: No acute osseous findings. IMPRESSION: 1. Normal contour and caliber of the thoracic and abdominal aorta. No evidence of aneurysm, dissection, or other acute aortic pathology. No significant atherosclerosis. 2. No findings to explain pain. Electronically Signed   By: Jearld Lesch M.D.   On: 12/10/2022 20:05    Procedures Procedures  {Document cardiac monitor, telemetry assessment procedure when appropriate:1}  Medications Ordered in ED Medications - No data to display  ED Course/ Medical Decision Making/ A&P   {   Click here for ABCD2, HEART and other calculatorsREFRESH Note before signing :1}                              Medical Decision Making Amount and/or Complexity of Data Reviewed Labs: ordered. Radiology:  ordered.   ***  {Document critical care time when appropriate:1} {Document review of labs and clinical decision tools ie heart score, Chads2Vasc2 etc:1}  {Document your independent review of radiology images, and any outside records:1} {Document your discussion with family members, caretakers, and with consultants:1} {Document social determinants of health affecting pt's care:1} {Document your decision making why or why not admission, treatments were needed:1} Final Clinical Impression(s) / ED Diagnoses Final diagnoses:  None    Rx / DC Orders ED Discharge Orders     None

## 2022-12-11 NOTE — ED Triage Notes (Addendum)
Pt came in via POV d/t central CP she describes as tightness that started 3 days ago, endorses radiation into her back. A/Ox4, rates her pain 7/10. Also endorses abd pain later in the day when her CP started & has not been able to hold down any food either.

## 2022-12-11 NOTE — ED Notes (Signed)
Patient taken for US

## 2022-12-12 LAB — SARS CORONAVIRUS 2 BY RT PCR: SARS Coronavirus 2 by RT PCR: NEGATIVE

## 2022-12-12 MED ORDER — POTASSIUM CHLORIDE CRYS ER 20 MEQ PO TBCR
40.0000 meq | EXTENDED_RELEASE_TABLET | Freq: Once | ORAL | Status: AC
  Administered 2022-12-12: 40 meq via ORAL
  Filled 2022-12-12: qty 2

## 2022-12-14 ENCOUNTER — Emergency Department (HOSPITAL_BASED_OUTPATIENT_CLINIC_OR_DEPARTMENT_OTHER)
Admission: EM | Admit: 2022-12-14 | Discharge: 2022-12-15 | Disposition: A | Discharge status: Home / Self Care | Payer: MEDICAID | Attending: Emergency Medicine | Admitting: Emergency Medicine

## 2022-12-14 ENCOUNTER — Encounter (HOSPITAL_BASED_OUTPATIENT_CLINIC_OR_DEPARTMENT_OTHER): Payer: Self-pay

## 2022-12-14 DIAGNOSIS — R1013 Epigastric pain: Secondary | ICD-10-CM | POA: Diagnosis present

## 2022-12-14 DIAGNOSIS — K2970 Gastritis, unspecified, without bleeding: Secondary | ICD-10-CM | POA: Insufficient documentation

## 2022-12-14 DIAGNOSIS — F12188 Cannabis abuse with other cannabis-induced disorder: Secondary | ICD-10-CM | POA: Diagnosis not present

## 2022-12-14 DIAGNOSIS — E876 Hypokalemia: Secondary | ICD-10-CM | POA: Insufficient documentation

## 2022-12-14 LAB — URINALYSIS, ROUTINE W REFLEX MICROSCOPIC
Bilirubin Urine: NEGATIVE
Glucose, UA: NEGATIVE mg/dL
Ketones, ur: NEGATIVE mg/dL
Leukocytes,Ua: NEGATIVE
Nitrite: NEGATIVE
Specific Gravity, Urine: 1.009 (ref 1.005–1.030)
pH: 6 (ref 5.0–8.0)

## 2022-12-14 LAB — COMPREHENSIVE METABOLIC PANEL
ALT: 8 U/L (ref 0–44)
AST: 14 U/L — ABNORMAL LOW (ref 15–41)
Albumin: 4.1 g/dL (ref 3.5–5.0)
Alkaline Phosphatase: 51 U/L (ref 38–126)
Anion gap: 10 (ref 5–15)
BUN: 6 mg/dL (ref 6–20)
CO2: 26 mmol/L (ref 22–32)
Calcium: 9.6 mg/dL (ref 8.9–10.3)
Chloride: 97 mmol/L — ABNORMAL LOW (ref 98–111)
Creatinine, Ser: 0.75 mg/dL (ref 0.44–1.00)
GFR, Estimated: >60 mL/min (ref >60)
Glucose, Bld: 95 mg/dL (ref 70–99)
Potassium: 3.2 mmol/L — ABNORMAL LOW (ref 3.5–5.1)
Sodium: 133 mmol/L — ABNORMAL LOW (ref 135–145)
Total Bilirubin: 0.4 mg/dL (ref 0.3–1.2)
Total Protein: 6 g/dL — ABNORMAL LOW (ref 6.5–8.1)

## 2022-12-14 LAB — CBC
HCT: 39.5 % (ref 36.0–46.0)
Hemoglobin: 14.1 g/dL (ref 12.0–15.0)
MCH: 34.2 pg — ABNORMAL HIGH (ref 26.0–34.0)
MCHC: 35.7 g/dL (ref 30.0–36.0)
MCV: 95.9 fL (ref 80.0–100.0)
Platelets: 446 10*3/uL — ABNORMAL HIGH (ref 150–400)
RBC: 4.12 MIL/uL (ref 3.87–5.11)
RDW: 12.5 % (ref 11.5–15.5)
WBC: 10.3 10*3/uL (ref 4.0–10.5)
nRBC: 0 % (ref 0.0–0.2)

## 2022-12-14 LAB — RAPID URINE DRUG SCREEN, HOSP PERFORMED
Amphetamines: NOT DETECTED
Barbiturates: NOT DETECTED
Benzodiazepines: NOT DETECTED
Cocaine: NOT DETECTED
Opiates: NOT DETECTED
Tetrahydrocannabinol: POSITIVE — AB

## 2022-12-14 LAB — LIPASE, BLOOD: Lipase: 11 U/L (ref 11–51)

## 2022-12-14 LAB — PREGNANCY, URINE: Preg Test, Ur: NEGATIVE

## 2022-12-14 LAB — MAGNESIUM: Magnesium: 1.7 mg/dL (ref 1.7–2.4)

## 2022-12-14 MED ORDER — FAMOTIDINE IN NACL 20-0.9 MG/50ML-% IV SOLN
20.0000 mg | Freq: Once | INTRAVENOUS | Status: AC
  Administered 2022-12-14: 20 mg via INTRAVENOUS
  Filled 2022-12-14: qty 50

## 2022-12-14 MED ORDER — DROPERIDOL 2.5 MG/ML IJ SOLN
2.5000 mg | Freq: Once | INTRAMUSCULAR | Status: AC
  Administered 2022-12-14: 2.5 mg via INTRAVENOUS
  Filled 2022-12-14: qty 2

## 2022-12-14 MED ORDER — POTASSIUM CHLORIDE 10 MEQ/100ML IV SOLN
10.0000 meq | INTRAVENOUS | Status: DC
  Administered 2022-12-14: 10 meq via INTRAVENOUS
  Filled 2022-12-14: qty 100

## 2022-12-14 MED ORDER — LACTATED RINGERS IV BOLUS
1000.0000 mL | Freq: Once | INTRAVENOUS | Status: AC
  Administered 2022-12-14: 1000 mL via INTRAVENOUS

## 2022-12-14 MED ORDER — DIPHENHYDRAMINE HCL 50 MG/ML IJ SOLN
12.5000 mg | Freq: Once | INTRAMUSCULAR | Status: AC
  Administered 2022-12-14: 12.5 mg via INTRAVENOUS
  Filled 2022-12-14: qty 1

## 2022-12-14 NOTE — ED Triage Notes (Signed)
Pt arrived POV for continued abd pain and epigastric pain, with nausea, pt seen twice for same within the past week. Pt's PCP started her on Carafate, pt has only took one dose w/ relief. VSS. Hx of chronic pain disorder and chronic abd pain. A&O x4, VSS, afebrile.

## 2022-12-14 NOTE — ED Provider Notes (Signed)
[place] EMERGENCY DEPARTMENT AT [facility] Provider Note   CSN: [id number] Arrival date & time: 12/14/22  1953     History  Chief Complaint  Patient presents with   Abdominal Pain    Laura Montoya is a 37 y.o. female.   Abdominal Pain Associated symptoms: nausea      37 year old female presenting to the emergency department with epigastric and left upper quadrant abdominal pain with uncontrolled nausea and dry heaves.  She has been seen multiple times in the past week for this.  She does have a history of cannabis use and last used roughly 1 day ago.  PCP started on Carafate and she took 1 dose without significant relief.  Seen in the emergency on 8/17 and 8/18 for similar symptoms.  Workup over the past few days includes a CT angiogram chest abdomen pelvis for dissection, chest x-ray and ultrasound of the abdomen which has all been reassuring.  Patient is declining further CT imaging at this time but asking for help with nausea control as she has had difficulty keeping anything down.  She endorses a burning discomfort in her left upper quadrant.  She is passing gas.  Home Medications Prior to Admission medications   Medication Sig Start Date End Date Taking? Authorizing Provider  albuterol (PROVENTIL HFA;VENTOLIN HFA) 108 (90 Base) MCG/ACT inhaler Inhale 1-2 puffs into the lungs every 6 (six) hours as needed for wheezing or shortness of breath.    [provider]  Azelastine HCl 137 MCG/SPRAY SOLN Place 2 sprays into both nostrils 2 (two) times daily as needed for allergies. 04/17/21   [provider]  buPROPion ER (WELLBUTRIN SR) 100 MG 12 hr tablet Take 1 tablet (100 mg total) by mouth daily. 08/31/22 09/30/22  Jeannie Fend, PA-C  busPIRone (BUSPAR) 7.5 MG tablet Take 1 tablet (7.5 mg total) by mouth 2 (two) times daily. 08/31/22   Jeannie Fend, PA-C  Cyanocobalamin (B-12) 2500 MCG TABS Take 1 tablet by mouth daily.    [provider]   DULoxetine (CYMBALTA) 30 MG capsule Take 1 capsule (30 mg total) by mouth daily. 06/20/22   Nita Sickle K, DO  folic acid (FOLVITE) 1 MG tablet Take 1 mg by mouth daily. 06/27/21   [provider]  hydroxychloroquine (PLAQUENIL) 200 MG tablet Take 200 mg by mouth at bedtime.    [provider]  lidocaine (LIDODERM) 5 % Place 1 patch onto the skin daily. Remove & Discard patch within 12 hours or as directed by MD 11/22/22   Prosperi, Ephriam Knuckles H, PA-C  methotrexate (RHEUMATREX) 2.5 MG tablet Take 12.5 mg by mouth every Sunday. (5 tablets) 07/19/21   [provider]  metoprolol succinate (TOPROL-XL) 25 MG 24 hr tablet Take 1 tablet by mouth daily. 05/10/22   [provider]  ondansetron (ZOFRAN-ODT) 8 MG disintegrating tablet Take 1 tablet (8 mg total) by mouth every 8 (eight) hours as needed for nausea or vomiting. 11/26/22   [name], MD  pantoprazole (PROTONIX) 20 MG tablet Take 2 tablets (40 mg total) by mouth daily for 10 days. 12/10/22 12/20/22  Alvira Monday, MD  potassium chloride 20 MEQ TBCR Take 1 tablet (20 mEq total) by mouth daily. 10/30/22   Zadie Rhine, MD  promethazine (PHENERGAN) 25 MG suppository Place 1 suppository (25 mg total) rectally every 6 (six) hours as needed for nausea or vomiting. 11/26/22   [name], MD      Allergies    Nsaids, Carbamazepine,  and Lavender oil    Review of Systems   Review of Systems  Gastrointestinal:  Positive for abdominal pain and nausea.  All other systems reviewed and are negative.   Physical Exam Updated Vital Signs BP (!) 115/100 (BP Location: Left Arm)   Pulse 92   Temp 97.8 F (36.6 C) (Oral)   Resp 16   Ht 5\' 2"  (1.575 m)   Wt 57.6 kg   LMP 11/20/2022   SpO2 100%   BMI 23.23 kg/m  Physical Exam Vitals and nursing note reviewed.  Constitutional:      General: She is not in acute distress.    Appearance: She is well-developed.  HENT:     Head: Normocephalic and atraumatic.  Eyes:      Conjunctiva/sclera: Conjunctivae normal.  Cardiovascular:     Rate and Rhythm: Normal rate and regular rhythm.     Heart sounds: No murmur heard. Pulmonary:     Effort: Pulmonary effort is normal. No respiratory distress.     Breath sounds: Normal breath sounds.  Abdominal:     Palpations: Abdomen is soft.     Tenderness: There is abdominal tenderness in the epigastric area and left upper quadrant.  Musculoskeletal:        General: No swelling.     Cervical back: Neck supple.  Skin:    General: Skin is warm and dry.     Capillary Refill: Capillary refill takes less than 2 seconds.  Neurological:     Mental Status: She is alert.  Psychiatric:        Mood and Affect: Mood normal.     ED Results / Procedures / Treatments   Labs (all labs ordered are listed, but only abnormal results are displayed) Labs Reviewed  COMPREHENSIVE METABOLIC PANEL - Abnormal; Notable for the following components:      Result Value   Sodium 133 (*)    Potassium 3.2 (*)    Chloride 97 (*)    Total Protein 6.0 (*)    AST 14 (*)    All other components within normal limits  CBC - Abnormal; Notable for the following components:   MCH 34.2 (*)    Platelets 446 (*)    All other components within normal limits  URINALYSIS, ROUTINE W REFLEX MICROSCOPIC - Abnormal; Notable for the following components:   Hgb urine dipstick LARGE (*)    Protein, ur TRACE (*)    Bacteria, UA FEW (*)    All other components within normal limits  RAPID URINE DRUG SCREEN, HOSP PERFORMED - Abnormal; Notable for the following components:   Tetrahydrocannabinol POSITIVE (*)    All other components within normal limits  LIPASE, BLOOD  PREGNANCY, URINE  MAGNESIUM    EKG EKG Interpretation Date/Time:  Wednesday December 14 2022 20:00:59 EDT Ventricular Rate:  107 PR Interval:  128 QRS Duration:  104 QT Interval:  348 QTC Calculation: 464 R Axis:   217  Text Interpretation: Sinus tachycardia Biatrial enlargement  Right superior axis deviation Incomplete right bundle branch block Right ventricular hypertrophy Possible Inferior infarct , age undetermined Cannot rule out Anterior infarct , age undetermined Abnormal ECG When compared with ECG of 11-Dec-2022 15:26, No significant change was found Confirmed by Ernie Avena ([id number]) on 12/14/2022 10:27:18 PM  Radiology No results found.  Procedures Procedures    Medications Ordered in ED Medications  lactated ringers bolus 1,000 mL (0 mLs Intravenous Stopped 12/15/22 0029)  famotidine (PEPCID) IVPB 20 mg premix (0 mg Intravenous  Stopped 12/15/22 0029)  droperidol (INAPSINE) 2.5 MG/ML injection 2.5 mg (2.5 mg Intravenous Given 12/14/22 2311)  diphenhydrAMINE (BENADRYL) injection 12.5 mg (12.5 mg Intravenous Given 12/14/22 2338)    ED Course/ Medical Decision Making/ A&P Clinical Course as of 12/17/22 1610  Wed Dec 14, 2022  2334 Tetrahydrocannabinol(!): POSITIVE [JL]    Clinical Course User Index [JL] Ernie Avena, MD                                 Medical Decision Making Amount and/or Complexity of Data Reviewed Labs: ordered. Decision-making details documented in ED Course.  Risk Prescription drug management.     37 year old female presenting to the emergency department with epigastric and left upper quadrant abdominal pain with uncontrolled nausea and dry heaves.  She has been seen multiple times in the past week for this.  She does have a history of cannabis use and last used roughly 1 day ago.  PCP started on Carafate and she took 1 dose without significant relief.  Seen in the emergency on 8/17 and 8/18 for similar symptoms.  Workup over the past few days includes a CT angiogram chest abdomen pelvis for dissection, chest x-ray and ultrasound of the abdomen which has all been reassuring.  Patient is declining further CT imaging at this time but asking for help with nausea control as she has had difficulty keeping anything down.  She endorses a  burning discomfort in her left upper quadrant.  She is passing gas.  On arrival, the patient was vitally stable, mildly tachycardic heart rate 107, BP stable, saturating well on room air.  Sinus tachycardia noted on cardiac telemetry.  Physical exam is significant for left upper quadrant and epigastric tenderness to palpation, no rebound or guarding.  Suspect cannabis hyperemesis syndrome, gastroparesis, gastritis.  Lower concern for other acute intra-abdominal emergency at this time.  The persistence of patient's symptoms despite recent ER workup that has been reassuring.  Patient is declining further imaging evaluation at this time, rest questing symptomatic management.  EKG was performed revealed sinus tachycardia, no significant prolonged QTc, no acute ischemic changes.  Laboratory evaluation revealed UDS positive for THC, lipase normal, CBC without a leukocytosis or anemia, urinalysis with large hemoglobin, negative for UTI, pregnancy negative, magnesium normal, CMP with mild hypokalemia to 3.2 otherwise unremarkable.  Was symptomatically managed with IV Pepcid, and IV fluid bolus, IV droperidol and Benadryl.  Following this, the patient was tolerating oral intake.  Potassium supplementation was ordered IV.  She was able to tolerate oral fluids.  I advised cessation of cannabis and close outpatient follow-up with a gastroenterologist.  Return precautions were provided.  She has a prescription for Pepcid and Protonix and has Carafate at home.  Stable for discharge.  Final Clinical Impression(s) / ED Diagnoses Final diagnoses:  Cannabis hyperemesis syndrome concurrent with and due to cannabis abuse (HCC)  Gastritis, presence of bleeding unspecified, unspecified chronicity, unspecified gastritis type    Rx / DC Orders ED Discharge Orders     None         Ernie Avena, MD 12/17/22 [phone]

## 2022-12-14 NOTE — ED Notes (Signed)
Patient called out c/o feeling hot/flushed, jittery after getting droperidol. MD notified

## 2022-12-15 NOTE — ED Notes (Signed)
Reviewed AVS/discharge instruction with patient. Time allotted for and all questions answered. Patient is agreeable for d/c and escorted to ed exit by staff.  

## 2022-12-15 NOTE — Discharge Instructions (Signed)
Follow-up outpatient with your gastroenterologist at [facility].  Please cut back on cannabis use and try to eliminate it entirely as this could be contributing to your uncontrolled nausea and vomiting and discomfort.  Additionally continue taking your previously prescribed medications to include Pepcid and Protonix.  You are tolerating oral intake after initial therapies for treatment for cannabis hyperemesis syndrome.  We had a component of mild gastritis as well in the setting of uncontrolled nausea and vomiting.  We offered CT imaging to further evaluate however given your recent multiple workups in the emergency department and recent imaging, you have declined further testing at this time which is reasonable.

## 2022-12-19 ENCOUNTER — Emergency Department (HOSPITAL_BASED_OUTPATIENT_CLINIC_OR_DEPARTMENT_OTHER)
Admission: EM | Admit: 2022-12-19 | Discharge: 2022-12-19 | Disposition: A | Discharge status: Home / Self Care | Payer: MEDICAID | Attending: Emergency Medicine | Admitting: Emergency Medicine

## 2022-12-19 ENCOUNTER — Other Ambulatory Visit: Payer: Self-pay

## 2022-12-19 DIAGNOSIS — R059 Cough, unspecified: Secondary | ICD-10-CM | POA: Diagnosis present

## 2022-12-19 DIAGNOSIS — J069 Acute upper respiratory infection, unspecified: Secondary | ICD-10-CM | POA: Insufficient documentation

## 2022-12-19 DIAGNOSIS — Z1152 Encounter for screening for COVID-19: Secondary | ICD-10-CM | POA: Diagnosis not present

## 2022-12-19 LAB — RESP PANEL BY RT-PCR (RSV, FLU A&B, COVID)  RVPGX2
Influenza A by PCR: NEGATIVE
Influenza B by PCR: NEGATIVE
Resp Syncytial Virus by PCR: NEGATIVE
SARS Coronavirus 2 by RT PCR: NEGATIVE

## 2022-12-19 NOTE — ED Provider Notes (Signed)
[place] EMERGENCY DEPARTMENT AT [facility] Provider Note   CSN: [id number] Arrival date & time: 12/19/22  2209     History  Chief Complaint  Patient presents with   Cough    Laura Montoya is a 37 y.o. female.  Patient to ED with URI symptoms of congestion and cough for the past 3 days. She reports body aches and chills but no known fever. No vomiting or diarrhea. Daughter has been sick with similar symptoms as well.   The history is provided by the patient. No language interpreter was used.  Cough      Home Medications Prior to Admission medications   Medication Sig Start Date End Date Taking? Authorizing Provider  albuterol (PROVENTIL HFA;VENTOLIN HFA) 108 (90 Base) MCG/ACT inhaler Inhale 1-2 puffs into the lungs every 6 (six) hours as needed for wheezing or shortness of breath.    [provider]  Azelastine HCl 137 MCG/SPRAY SOLN Place 2 sprays into both nostrils 2 (two) times daily as needed for allergies. 04/17/21   [provider]  buPROPion ER (WELLBUTRIN SR) 100 MG 12 hr tablet Take 1 tablet (100 mg total) by mouth daily. 08/31/22 09/30/22  Jeannie Fend, PA-C  busPIRone (BUSPAR) 7.5 MG tablet Take 1 tablet (7.5 mg total) by mouth 2 (two) times daily. 08/31/22   Jeannie Fend, PA-C  Cyanocobalamin (B-12) 2500 MCG TABS Take 1 tablet by mouth daily.    [provider]  DULoxetine (CYMBALTA) 30 MG capsule Take 1 capsule (30 mg total) by mouth daily. 06/20/22   Nita Sickle K, DO  folic acid (FOLVITE) 1 MG tablet Take 1 mg by mouth daily. 06/27/21   [provider]  hydroxychloroquine (PLAQUENIL) 200 MG tablet Take 200 mg by mouth at bedtime.    [provider]  lidocaine (LIDODERM) 5 % Place 1 patch onto the skin daily. Remove & Discard patch within 12 hours or as directed by MD 11/22/22   Prosperi, Ephriam Knuckles H, PA-C  methotrexate (RHEUMATREX) 2.5 MG tablet Take 12.5 mg by mouth every Sunday. (5 tablets) 07/19/21    [provider]  metoprolol succinate (TOPROL-XL) 25 MG 24 hr tablet Take 1 tablet by mouth daily. 05/10/22   [provider]  ondansetron (ZOFRAN-ODT) 8 MG disintegrating tablet Take 1 tablet (8 mg total) by mouth every 8 (eight) hours as needed for nausea or vomiting. 11/26/22   [name], MD  pantoprazole (PROTONIX) 20 MG tablet Take 2 tablets (40 mg total) by mouth daily for 10 days. 12/10/22 12/20/22  Alvira Monday, MD  potassium chloride 20 MEQ TBCR Take 1 tablet (20 mEq total) by mouth daily. 10/30/22   Zadie Rhine, MD  promethazine (PHENERGAN) 25 MG suppository Place 1 suppository (25 mg total) rectally every 6 (six) hours as needed for nausea or vomiting. 11/26/22   [name], MD      Allergies    Nsaids, Carbamazepine, and Lavender oil    Review of Systems   Review of Systems  Respiratory:  Positive for cough.     Physical Exam Updated Vital Signs BP 114/72   Pulse (!) 104   Temp 99 F (37.2 C) (Oral)   Resp 20   LMP 11/20/2022 (Exact Date)   SpO2 100%  Physical Exam Vitals and nursing note reviewed.  Constitutional:      General: She is not in acute distress.    Appearance: She is not ill-appearing.  HENT:     Right Ear: Tympanic membrane  normal.     Left Ear: Tympanic membrane normal.     Nose: Nose normal.     Mouth/Throat:     Mouth: Mucous membranes are dry.  Cardiovascular:     Rate and Rhythm: Normal rate and regular rhythm.     Heart sounds: No murmur heard. Pulmonary:     Effort: Pulmonary effort is normal.     Breath sounds: No wheezing, rhonchi or rales.  Abdominal:     General: There is no distension.     Palpations: Abdomen is soft.  Musculoskeletal:        General: Normal range of motion.     Cervical back: Neck supple.  Skin:    General: Skin is warm and dry.  Neurological:     Mental Status: She is alert and oriented to person, place, and time.     ED Results / Procedures / Treatments   Labs (all labs ordered  are listed, but only abnormal results are displayed) Labs Reviewed  RESP PANEL BY RT-PCR (RSV, FLU A&B, COVID)  RVPGX2    EKG None  Radiology No results found.  Procedures Procedures    Medications Ordered in ED Medications - No data to display  ED Course/ Medical Decision Making/ A&P                                 Medical Decision Making          Final Clinical Impression(s) / ED Diagnoses Final diagnoses:  Viral upper respiratory tract infection    Rx / DC Orders ED Discharge Orders     None         Danne Harbor 12/19/22 2343    Laurence Spates, MD 12/20/22 1440

## 2022-12-19 NOTE — ED Triage Notes (Signed)
Pt reports cough, headache, bodyaches, and chills x 3 days.

## 2022-12-24 ENCOUNTER — Emergency Department (HOSPITAL_COMMUNITY): Payer: MEDICAID

## 2022-12-24 ENCOUNTER — Encounter (HOSPITAL_COMMUNITY): Payer: Self-pay

## 2022-12-24 ENCOUNTER — Other Ambulatory Visit: Payer: Self-pay

## 2022-12-24 ENCOUNTER — Emergency Department (HOSPITAL_COMMUNITY)
Admission: EM | Admit: 2022-12-24 | Discharge: 2022-12-24 | Disposition: A | Discharge status: Home / Self Care | Payer: MEDICAID | Attending: Emergency Medicine | Admitting: Emergency Medicine

## 2022-12-24 DIAGNOSIS — M542 Cervicalgia: Secondary | ICD-10-CM | POA: Insufficient documentation

## 2022-12-24 DIAGNOSIS — M549 Dorsalgia, unspecified: Secondary | ICD-10-CM | POA: Diagnosis not present

## 2022-12-24 DIAGNOSIS — R1115 Cyclical vomiting syndrome unrelated to migraine: Secondary | ICD-10-CM | POA: Insufficient documentation

## 2022-12-24 DIAGNOSIS — D72829 Elevated white blood cell count, unspecified: Secondary | ICD-10-CM | POA: Insufficient documentation

## 2022-12-24 DIAGNOSIS — R1084 Generalized abdominal pain: Secondary | ICD-10-CM | POA: Insufficient documentation

## 2022-12-24 DIAGNOSIS — R200 Anesthesia of skin: Secondary | ICD-10-CM | POA: Diagnosis not present

## 2022-12-24 DIAGNOSIS — R112 Nausea with vomiting, unspecified: Secondary | ICD-10-CM | POA: Diagnosis present

## 2022-12-24 LAB — COMPREHENSIVE METABOLIC PANEL
ALT: 8 U/L (ref 0–44)
AST: 23 U/L (ref 15–41)
Albumin: 3.1 g/dL — ABNORMAL LOW (ref 3.5–5.0)
Alkaline Phosphatase: 84 U/L (ref 38–126)
Anion gap: 17 — ABNORMAL HIGH (ref 5–15)
BUN: 8 mg/dL (ref 6–20)
CO2: 22 mmol/L (ref 22–32)
Calcium: 9.2 mg/dL (ref 8.9–10.3)
Chloride: 95 mmol/L — ABNORMAL LOW (ref 98–111)
Creatinine, Ser: 0.98 mg/dL (ref 0.44–1.00)
GFR, Estimated: >60 mL/min (ref >60)
Glucose, Bld: 125 mg/dL — ABNORMAL HIGH (ref 70–99)
Potassium: 4.2 mmol/L (ref 3.5–5.1)
Sodium: 134 mmol/L — ABNORMAL LOW (ref 135–145)
Total Bilirubin: 1.1 mg/dL (ref 0.3–1.2)
Total Protein: 6.3 g/dL — ABNORMAL LOW (ref 6.5–8.1)

## 2022-12-24 LAB — URINALYSIS, ROUTINE W REFLEX MICROSCOPIC
Bilirubin Urine: NEGATIVE
Glucose, UA: NEGATIVE mg/dL
Ketones, ur: 20 mg/dL — AB
Leukocytes,Ua: NEGATIVE
Nitrite: NEGATIVE
Protein, ur: NEGATIVE mg/dL
Specific Gravity, Urine: 1.014 (ref 1.005–1.030)
pH: 8 (ref 5.0–8.0)

## 2022-12-24 LAB — CBC
HCT: 40.3 % (ref 36.0–46.0)
Hemoglobin: 14.2 g/dL (ref 12.0–15.0)
MCH: 34.3 pg — ABNORMAL HIGH (ref 26.0–34.0)
MCHC: 35.2 g/dL (ref 30.0–36.0)
MCV: 97.3 fL (ref 80.0–100.0)
Platelets: 595 10*3/uL — ABNORMAL HIGH (ref 150–400)
RBC: 4.14 MIL/uL (ref 3.87–5.11)
RDW: 12.5 % (ref 11.5–15.5)
WBC: 16.9 10*3/uL — ABNORMAL HIGH (ref 4.0–10.5)
nRBC: 0 % (ref 0.0–0.2)

## 2022-12-24 LAB — HCG, SERUM, QUALITATIVE: Preg, Serum: NEGATIVE

## 2022-12-24 LAB — LIPASE, BLOOD: Lipase: 18 U/L (ref 11–51)

## 2022-12-24 MED ORDER — LACTATED RINGERS IV BOLUS
1000.0000 mL | Freq: Once | INTRAVENOUS | Status: AC
  Administered 2022-12-24: 1000 mL via INTRAVENOUS

## 2022-12-24 MED ORDER — HALOPERIDOL LACTATE 5 MG/ML IJ SOLN
5.0000 mg | Freq: Once | INTRAMUSCULAR | Status: AC
  Administered 2022-12-24: 5 mg via INTRAVENOUS
  Filled 2022-12-24: qty 1

## 2022-12-24 MED ORDER — MORPHINE SULFATE (PF) 4 MG/ML IV SOLN
4.0000 mg | Freq: Once | INTRAVENOUS | Status: AC
  Administered 2022-12-24: 4 mg via INTRAVENOUS
  Filled 2022-12-24: qty 1

## 2022-12-24 MED ORDER — ALUM & MAG HYDROXIDE-SIMETH 200-200-20 MG/5ML PO SUSP
30.0000 mL | Freq: Once | ORAL | Status: AC
  Administered 2022-12-24: 30 mL via ORAL
  Filled 2022-12-24: qty 30

## 2022-12-24 NOTE — ED Provider Notes (Signed)
[place] EMERGENCY DEPARTMENT AT [facility] Provider Note   CSN: [id number] Arrival date & time: 12/24/22  7829     History  Chief Complaint  Patient presents with   Emesis    Laura Montoya is a 37 y.o. female.  HPI    Pt comes in with cc of emesis, back and neck pain, numbness in lower extremities.  She has hx of GERD, scoliosis and cyclic vomiting syndrome thought to be because of marijuana use.  Patient has been having back pain for the last week.  She states that she thinks she might of slept funny.  She has chronic back pain secondary to scoliosis.  She is having generalized abdominal pain that is typical for her, but more severe than usual along with nausea and vomiting.  Emesis x 5 yesterday per patient.  No emesis today. Patient denies any diarrhea.  No history of abdominal surgery.  Patient does not think she is pregnant.  She also indicates burning with urination.  Home Medications Prior to Admission medications   Medication Sig Start Date End Date Taking? Authorizing Provider  albuterol (PROVENTIL HFA;VENTOLIN HFA) 108 (90 Base) MCG/ACT inhaler Inhale 1-2 puffs into the lungs every 6 (six) hours as needed for wheezing or shortness of breath.    [provider]  Azelastine HCl 137 MCG/SPRAY SOLN Place 2 sprays into both nostrils 2 (two) times daily as needed for allergies. 04/17/21   [provider]  buPROPion ER (WELLBUTRIN SR) 100 MG 12 hr tablet Take 1 tablet (100 mg total) by mouth daily. 08/31/22 09/30/22  Jeannie Fend, PA-C  busPIRone (BUSPAR) 7.5 MG tablet Take 1 tablet (7.5 mg total) by mouth 2 (two) times daily. 08/31/22   Jeannie Fend, PA-C  Cyanocobalamin (B-12) 2500 MCG TABS Take 1 tablet by mouth daily.    [provider]  DULoxetine (CYMBALTA) 30 MG capsule Take 1 capsule (30 mg total) by mouth daily. 06/20/22   Nita Sickle K, DO  folic acid (FOLVITE) 1 MG tablet Take 1 mg by mouth daily. 06/27/21   [provider]  hydroxychloroquine (PLAQUENIL) 200 MG tablet Take 200 mg by mouth at bedtime.    [provider]  lidocaine (LIDODERM) 5 % Place 1 patch onto the skin daily. Remove & Discard patch within 12 hours or as directed by MD 11/22/22   Prosperi, Ephriam Knuckles H, PA-C  methotrexate (RHEUMATREX) 2.5 MG tablet Take 12.5 mg by mouth every Sunday. (5 tablets) 07/19/21   [provider]  metoprolol succinate (TOPROL-XL) 25 MG 24 hr tablet Take 1 tablet by mouth daily. 05/10/22   [provider]  ondansetron (ZOFRAN-ODT) 8 MG disintegrating tablet Take 1 tablet (8 mg total) by mouth every 8 (eight) hours as needed for nausea or vomiting. 11/26/22   [name], MD  pantoprazole (PROTONIX) 20 MG tablet Take 2 tablets (40 mg total) by mouth daily for 10 days. 12/10/22 12/20/22  Alvira Monday, MD  potassium chloride 20 MEQ TBCR Take 1 tablet (20 mEq total) by mouth daily. 10/30/22   Zadie Rhine, MD  promethazine (PHENERGAN) 25 MG suppository Place 1 suppository (25 mg total) rectally every 6 (six) hours as needed for nausea or vomiting. 11/26/22   [name], MD      Allergies    Nsaids, Carbamazepine, and Lavender oil    Review of Systems   Review of Systems  All other systems reviewed and are negative.   Physical Exam Updated Vital Signs  BP 124/79 (BP Location: Right Arm)   Pulse (!) 117   Temp 99.6 F (37.6 C) (Oral)   Resp 18   Ht 5\' 2"  (1.575 m)   Wt 57.6 kg   LMP 11/20/2022 (Exact Date)   SpO2 100%   BMI 23.23 kg/m  Physical Exam Vitals and nursing note reviewed.  Constitutional:      Appearance: She is well-developed.  HENT:     Head: Atraumatic.  Cardiovascular:     Rate and Rhythm: Normal rate.  Pulmonary:     Effort: Pulmonary effort is normal.  Abdominal:     Palpations: Abdomen is soft.     Tenderness: There is abdominal tenderness.  Musculoskeletal:     Cervical back: Normal range of motion and neck supple.  Skin:    General: Skin is  warm and dry.  Neurological:     Mental Status: She is alert and oriented to person, place, and time.     ED Results / Procedures / Treatments   Labs (all labs ordered are listed, but only abnormal results are displayed) Labs Reviewed  COMPREHENSIVE METABOLIC PANEL - Abnormal; Notable for the following components:      Result Value   Sodium 134 (*)    Chloride 95 (*)    Glucose, Bld 125 (*)    Total Protein 6.3 (*)    Albumin 3.1 (*)    Anion gap 17 (*)    All other components within normal limits  CBC - Abnormal; Notable for the following components:   WBC 16.9 (*)    MCH 34.3 (*)    Platelets 595 (*)    All other components within normal limits  LIPASE, BLOOD  HCG, SERUM, QUALITATIVE  URINALYSIS, ROUTINE W REFLEX MICROSCOPIC    EKG EKG Interpretation Date/Time:  Saturday December 24 2022 08:21:51 EDT Ventricular Rate:  77 PR Interval:  104 QRS Duration:  109 QT Interval:  388 QTC Calculation: 440 R Axis:   62  Text Interpretation: Sinus rhythm Short PR interval RSR' in V1 or V2, right VCD or RVH No acute changes Confirmed by Derwood Kaplan [phone]) on 12/24/2022 8:22:57 AM  Radiology DG Chest Port 1 View  Result Date: 12/24/2022 CLINICAL DATA:  Rule out free air.  Emesis EXAM: PORTABLE CHEST 1 VIEW COMPARISON:  12/11/2022 FINDINGS: Heart size appears normal. There is no pleural effusion or interstitial edema. No airspace consolidation identified. The visualized osseous structures are unremarkable. Limited imaging through the upper abdomen shows no specific findings for pneumoperitoneum. IMPRESSION: 1. No acute cardiopulmonary disease. 2. No specific findings for pneumoperitoneum. Electronically Signed   By: Signa Kell M.D.   On: 12/24/2022 09:05    Procedures Procedures    Medications Ordered in ED Medications  lactated ringers bolus 1,000 mL (1,000 mLs Intravenous New Bag/Given 12/24/22 0814)  morphine (PF) 4 MG/ML injection 4 mg (4 mg Intravenous Given 12/24/22  0815)  haloperidol lactate (HALDOL) injection 5 mg (5 mg Intravenous Given 12/24/22 0823)  alum & mag hydroxide-simeth (MAALOX/MYLANTA) 200-200-20 MG/5ML suspension 30 mL (30 mLs Oral Given 12/24/22 1914)    ED Course/ Medical Decision Making/ A&P                                 Medical Decision Making Amount and/or Complexity of Data Reviewed Labs: ordered. Radiology: ordered.  Risk OTC drugs. Prescription drug management.   This patient presents to the ED with chief  complaint(s) of abdominal pain, back pain with pertinent past medical history of cyclic vomiting syndrome, scoliosis.The complaint involves an extensive differential diagnosis and also carries with it a high risk of complications and morbidity.    The differential diagnosis includes : Cannabinoid hyperemesis syndrome, cyclic vomiting syndrome, gastroparesis, spasmodic disease of the abdomen, IBS.  Other possibilities include severe electrolyte abnormality as a result of vomiting, AKI and dehydration.  We also considered perforated viscus/gastritis in the differential diagnosis.  Plan is to get basic labs and reassess the patient.  We will focus on symptom management at this time.  Additionally, clinically appears that the back pain is chronic in nature. Pt has no associated NEW numbness, weakness, urinary incontinence, urinary retention, bowel incontinence, pins and needle sensation in the perineal area.  Additional history obtained: Records reviewed previous admission documents and previous imaging .  She had a CT of her abdomen on August 17 which was reassuring.  She had a recent CT renal stone on 8-2 which was reassuring.  She had ultrasound of her abdomen which was also normal this month.  Independent labs interpretation:  The following labs were independently interpreted: CBC shows leukocytosis, likely early marker of inflammation.  She also has elevated platelet for the same reason most likely.  Independent  visualization and interpretation of imaging: - I independently visualized the following imaging with scope of interpretation limited to determining acute life threatening conditions related to emergency care: X-ray of the chest, which revealed no evidence of free air  Treatment and Reassessment: Pt reassessed. Pt's VSS and WNL. Pt's cap refill < 3 seconds. Pt has been hydrated in the ER and now passed po challenge.  Pain is improved. We will discharge with antiemetic.  Discussed cessation of marijuana. strict ER return precautions have been discussed and pt will return if he is unable to tolerate fluids and symptoms are getting worse.   Final Clinical Impression(s) / ED Diagnoses Final diagnoses:  Cyclic vomiting syndrome    Rx / DC Orders ED Discharge Orders     None         Derwood Kaplan, MD 12/24/22 [phone]

## 2022-12-24 NOTE — Discharge Instructions (Addendum)
We saw in the emergency room for abdominal pain, nausea and vomiting. He does appear that he has cyclic vomiting syndrome, possibly related to the marijuana use.  Please refrain from heavy cannabis use.

## 2022-12-24 NOTE — ED Notes (Signed)
Pt sleeping, o2 saturation dropped into the 80s. O2 improved with patient taking a few deep breaths. Pt placed on 2lnc. PT AxOx4.

## 2022-12-24 NOTE — ED Triage Notes (Signed)
Pt c/o neck and entire back painx1wk. Pt c/o N/V started yesterday. Pt states vomited around 15 times.Pt c/o numbness and tingling in feet and hips.

## 2023-01-07 ENCOUNTER — Emergency Department (HOSPITAL_COMMUNITY)
Admission: EM | Admit: 2023-01-07 | Discharge: 2023-01-07 | Disposition: A | Discharge status: Home / Self Care | Payer: MEDICAID | Attending: Emergency Medicine | Admitting: Emergency Medicine

## 2023-01-07 ENCOUNTER — Encounter (HOSPITAL_COMMUNITY): Payer: Self-pay

## 2023-01-07 ENCOUNTER — Other Ambulatory Visit: Payer: Self-pay

## 2023-01-07 DIAGNOSIS — H66002 Acute suppurative otitis media without spontaneous rupture of ear drum, left ear: Secondary | ICD-10-CM | POA: Insufficient documentation

## 2023-01-07 DIAGNOSIS — Z720 Tobacco use: Secondary | ICD-10-CM | POA: Insufficient documentation

## 2023-01-07 DIAGNOSIS — J45909 Unspecified asthma, uncomplicated: Secondary | ICD-10-CM | POA: Insufficient documentation

## 2023-01-07 DIAGNOSIS — H9202 Otalgia, left ear: Secondary | ICD-10-CM | POA: Diagnosis present

## 2023-01-07 MED ORDER — AMOXICILLIN 875 MG PO TABS
875.0000 mg | ORAL_TABLET | Freq: Two times a day (BID) | ORAL | 0 refills | Status: AC
Start: 2023-01-07 — End: 2023-01-14

## 2023-01-07 NOTE — Discharge Instructions (Signed)
Thank you for allowing [name] to be a part of your care today.  You have an ear infection in your left ear.  I have sent an oral antibiotic to the pharmacy to treat this.  Please take as prescribed.   I recommend taking Tylenol or ibuprofen as needed for pain.    Follow up with PCP if symptoms do not improve with treatment.

## 2023-01-07 NOTE — ED Provider Notes (Signed)
[place] EMERGENCY DEPARTMENT AT [facility] Provider Note   CSN: [id number] Arrival date & time: 01/07/23  0759     History  Chief Complaint  Patient presents with   Otalgia    Laura Montoya is a 37 y.o. female with past medical history significant for asthma, schizophrenia, anxiety, headache, GERD presents to the ED complaining of left ear pain for the past 3 weeks.  Patient concerned for ear infection.  She states she was seen at the [facility] for similar symptoms earlier in the month and was told to continue using OTC ear drops.  Denies fever, chills.  She did have congestion prior to ear symptoms.         Home Medications Prior to Admission medications   Medication Sig Start Date End Date Taking? Authorizing Provider  amoxicillin (AMOXIL) 875 MG tablet Take 1 tablet (875 mg total) by mouth 2 (two) times daily for 7 days. 01/07/23 01/14/23 Yes Jwan Hornbaker R, PA-C  albuterol (PROVENTIL HFA;VENTOLIN HFA) 108 (90 Base) MCG/ACT inhaler Inhale 1-2 puffs into the lungs every 6 (six) hours as needed for wheezing or shortness of breath.    [provider]  Azelastine HCl 137 MCG/SPRAY SOLN Place 2 sprays into both nostrils 2 (two) times daily as needed for allergies. 04/17/21   [provider]  buPROPion ER (WELLBUTRIN SR) 100 MG 12 hr tablet Take 1 tablet (100 mg total) by mouth daily. 08/31/22 09/30/22  Jeannie Fend, PA-C  busPIRone (BUSPAR) 7.5 MG tablet Take 1 tablet (7.5 mg total) by mouth 2 (two) times daily. 08/31/22   Jeannie Fend, PA-C  Cyanocobalamin (B-12) 2500 MCG TABS Take 1 tablet by mouth daily.    [provider]  DULoxetine (CYMBALTA) 30 MG capsule Take 1 capsule (30 mg total) by mouth daily. 06/20/22   Nita Sickle K, DO  folic acid (FOLVITE) 1 MG tablet Take 1 mg by mouth daily. 06/27/21   [provider]  hydroxychloroquine (PLAQUENIL) 200 MG tablet Take 200 mg by mouth at bedtime.    [provider]  lidocaine  (LIDODERM) 5 % Place 1 patch onto the skin daily. Remove & Discard patch within 12 hours or as directed by MD 11/22/22   Prosperi, Ephriam Knuckles H, PA-C  methotrexate (RHEUMATREX) 2.5 MG tablet Take 12.5 mg by mouth every Sunday. (5 tablets) 07/19/21   [provider]  metoprolol succinate (TOPROL-XL) 25 MG 24 hr tablet Take 1 tablet by mouth daily. 05/10/22   [provider]  ondansetron (ZOFRAN-ODT) 8 MG disintegrating tablet Take 1 tablet (8 mg total) by mouth every 8 (eight) hours as needed for nausea or vomiting. 11/26/22   [name], MD  pantoprazole (PROTONIX) 20 MG tablet Take 2 tablets (40 mg total) by mouth daily for 10 days. 12/10/22 12/20/22  Alvira Monday, MD  potassium chloride 20 MEQ TBCR Take 1 tablet (20 mEq total) by mouth daily. 10/30/22   Zadie Rhine, MD  promethazine (PHENERGAN) 25 MG suppository Place 1 suppository (25 mg total) rectally every 6 (six) hours as needed for nausea or vomiting. 11/26/22   [name], MD      Allergies    Nsaids, Carbamazepine, and Lavender oil    Review of Systems   Review of Systems  Constitutional:  Negative for chills and fever.  HENT:  Positive for congestion, ear pain and hearing loss (decreased in left).     Physical Exam Updated Vital Signs BP 112/72   Pulse 98  Temp 98.1 F (36.7 C)   Resp 16   LMP 11/20/2022 (Exact Date)   SpO2 100%  Physical Exam Vitals and nursing note reviewed.  Constitutional:      General: She is not in acute distress.    Appearance: Normal appearance. She is not ill-appearing or diaphoretic.  HENT:     Right Ear: Tympanic membrane and ear canal normal.     Left Ear: Ear canal normal. Decreased hearing noted. A middle ear effusion is present. Tympanic membrane is erythematous and bulging.  Cardiovascular:     Rate and Rhythm: Normal rate and regular rhythm.  Pulmonary:     Effort: Pulmonary effort is normal.  Skin:    General: Skin is warm and dry.     Capillary Refill:  Capillary refill takes less than 2 seconds.  Neurological:     Mental Status: She is alert. Mental status is at baseline.  Psychiatric:        Mood and Affect: Mood normal.        Behavior: Behavior normal.     ED Results / Procedures / Treatments   Labs (all labs ordered are listed, but only abnormal results are displayed) Labs Reviewed - No data to display  EKG None  Radiology No results found.  Procedures Procedures    Medications Ordered in ED Medications - No data to display  ED Course/ Medical Decision Making/ A&P                                 Medical Decision Making  This patient presents to the ED with chief complaint(s) of left ear pain for 3 weeks.  The complaint involves an extensive differential diagnosis and also carries with it a high risk of complications and morbidity.    The differential diagnosis includes otitis media, otitis externa   Initial Assessment:   Exam significant for erythematous, bulging left TM with mid ear effusion.  Mildly decreased hearing.  Normal EACs bilaterally.  Right TM unremarkable.   Disposition:   Patient with evidence of acute otitis media in the left ear.  Amoxicillin sent to pharmacy for treatment.  Recommended PCP follow up if symptoms do not improve.    The patient has been appropriately medically screened and/or stabilized in the ED. I have low suspicion for any other emergent medical condition which would require further screening, evaluation or treatment in the ED or require inpatient management. At time of discharge the patient is hemodynamically stable and in no acute distress. I have discussed work-up results and diagnosis with patient and answered all questions. Patient is agreeable with discharge plan. We discussed strict return precautions for returning to the emergency department and they verbalized understanding.    Social Determinants of Health:   Patient's  tobacco use   increases the complexity of managing  their presentation         Final Clinical Impression(s) / ED Diagnoses Final diagnoses:  Non-recurrent acute suppurative otitis media of left ear without spontaneous rupture of tympanic membrane    Rx / DC Orders ED Discharge Orders          Ordered    amoxicillin (AMOXIL) 875 MG tablet  2 times daily        01/07/23 0827              Melton Alar R, PA-C 01/07/23 0829    Royanne Foots, DO 01/12/23 1635

## 2023-01-07 NOTE — ED Triage Notes (Signed)
Pt arrives via POV. Pt c/o left ear pain for 3 weeks. She believes she has an ear infection.

## 2023-01-17 ENCOUNTER — Emergency Department (HOSPITAL_BASED_OUTPATIENT_CLINIC_OR_DEPARTMENT_OTHER)
Admission: EM | Admit: 2023-01-17 | Discharge: 2023-01-17 | Disposition: A | Discharge status: Home / Self Care | Payer: MEDICAID | Attending: Emergency Medicine | Admitting: Emergency Medicine

## 2023-01-17 ENCOUNTER — Other Ambulatory Visit: Payer: Self-pay

## 2023-01-17 DIAGNOSIS — R1084 Generalized abdominal pain: Secondary | ICD-10-CM | POA: Insufficient documentation

## 2023-01-17 DIAGNOSIS — E876 Hypokalemia: Secondary | ICD-10-CM | POA: Diagnosis not present

## 2023-01-17 DIAGNOSIS — R112 Nausea with vomiting, unspecified: Secondary | ICD-10-CM | POA: Diagnosis not present

## 2023-01-17 LAB — URINALYSIS, ROUTINE W REFLEX MICROSCOPIC
Bilirubin Urine: NEGATIVE
Glucose, UA: NEGATIVE mg/dL
Hgb urine dipstick: NEGATIVE
Ketones, ur: 15 mg/dL — AB
Leukocytes,Ua: NEGATIVE
Nitrite: NEGATIVE
Protein, ur: NEGATIVE mg/dL
Specific Gravity, Urine: 1.005 (ref 1.005–1.030)
pH: 6 (ref 5.0–8.0)

## 2023-01-17 LAB — CBC WITH DIFFERENTIAL/PLATELET
Abs Immature Granulocytes: 0.02 10*3/uL (ref 0.00–0.07)
Basophils Absolute: 0.1 10*3/uL (ref 0.0–0.1)
Basophils Relative: 1 %
Eosinophils Absolute: 0 10*3/uL (ref 0.0–0.5)
Eosinophils Relative: 0 %
HCT: 45.7 % (ref 36.0–46.0)
Hemoglobin: 16.5 g/dL — ABNORMAL HIGH (ref 12.0–15.0)
Immature Granulocytes: 0 %
Lymphocytes Relative: 34 %
Lymphs Abs: 3.5 10*3/uL (ref 0.7–4.0)
MCH: 34.5 pg — ABNORMAL HIGH (ref 26.0–34.0)
MCHC: 36.1 g/dL — ABNORMAL HIGH (ref 30.0–36.0)
MCV: 95.6 fL (ref 80.0–100.0)
Monocytes Absolute: 0.8 10*3/uL (ref 0.1–1.0)
Monocytes Relative: 8 %
Neutro Abs: 5.9 10*3/uL (ref 1.7–7.7)
Neutrophils Relative %: 57 %
Platelets: 477 10*3/uL — ABNORMAL HIGH (ref 150–400)
RBC: 4.78 MIL/uL (ref 3.87–5.11)
RDW: 13.4 % (ref 11.5–15.5)
WBC: 10.3 10*3/uL (ref 4.0–10.5)
nRBC: 0 % (ref 0.0–0.2)

## 2023-01-17 LAB — COMPREHENSIVE METABOLIC PANEL
ALT: 6 U/L (ref 0–44)
AST: 15 U/L (ref 15–41)
Albumin: 4.8 g/dL (ref 3.5–5.0)
Alkaline Phosphatase: 77 U/L (ref 38–126)
Anion gap: 15 (ref 5–15)
BUN: 13 mg/dL (ref 6–20)
CO2: 27 mmol/L (ref 22–32)
Calcium: 10.2 mg/dL (ref 8.9–10.3)
Chloride: 89 mmol/L — ABNORMAL LOW (ref 98–111)
Creatinine, Ser: 0.78 mg/dL (ref 0.44–1.00)
GFR, Estimated: >60 mL/min (ref >60)
Glucose, Bld: 72 mg/dL (ref 70–99)
Potassium: 3.2 mmol/L — ABNORMAL LOW (ref 3.5–5.1)
Sodium: 131 mmol/L — ABNORMAL LOW (ref 135–145)
Total Bilirubin: 1 mg/dL (ref 0.3–1.2)
Total Protein: 8.1 g/dL (ref 6.5–8.1)

## 2023-01-17 LAB — LIPASE, BLOOD: Lipase: <10 U/L — ABNORMAL LOW (ref 11–51)

## 2023-01-17 LAB — PREGNANCY, URINE: Preg Test, Ur: NEGATIVE

## 2023-01-17 MED ORDER — POTASSIUM CHLORIDE 10 MEQ/100ML IV SOLN
10.0000 meq | Freq: Once | INTRAVENOUS | Status: AC
  Administered 2023-01-17: 10 meq via INTRAVENOUS
  Filled 2023-01-17: qty 100

## 2023-01-17 MED ORDER — PROMETHAZINE HCL 25 MG RE SUPP
25.0000 mg | Freq: Four times a day (QID) | RECTAL | 0 refills | Status: DC | PRN
Start: 2023-01-17 — End: 2024-03-03

## 2023-01-17 MED ORDER — SODIUM CHLORIDE 0.9 % IV BOLUS
1000.0000 mL | Freq: Once | INTRAVENOUS | Status: AC
  Administered 2023-01-17: 1000 mL via INTRAVENOUS

## 2023-01-17 MED ORDER — DROPERIDOL 2.5 MG/ML IJ SOLN
2.5000 mg | Freq: Once | INTRAMUSCULAR | Status: AC
  Administered 2023-01-17: 2.5 mg via INTRAVENOUS
  Filled 2023-01-17: qty 2

## 2023-01-17 NOTE — ED Provider Notes (Signed)
EMERGENCY DEPARTMENT AT [facility] Provider Note   CSN: [id number] Arrival date & time: 01/17/23  1646     History  Chief Complaint  Patient presents with   Abdominal Pain    Laura Montoya is a 37 y.o. female.  37 y.o female with no PMH presents to the ED with a chief complaint of abdominal pain x 5 days ago. Patient endorses generalized pain along her entire abdomen, reports she has not had a vomiting episode since Sunday.  She does report taking some Reglan suppositories last night which did help with her nausea.  She has had multiple episodes of vomiting prior to this but nonbilious and nonbloody.  She does not have any prior surgeries to her abdomen.  There is no alleviating or exacerbating factors.  She denies any fever, chest pain, urinary symptoms, blood in her stool.  The history is provided by the patient.  Abdominal Pain Pain location:  Generalized Pain quality: cramping   Pain radiates to:  Does not radiate Pain severity:  Mild Onset quality:  Sudden Duration:  5 days Associated symptoms: no chest pain, no chills, no fever and no shortness of breath        Home Medications Prior to Admission medications   Medication Sig Start Date End Date Taking? Authorizing Provider  albuterol (PROVENTIL HFA;VENTOLIN HFA) 108 (90 Base) MCG/ACT inhaler Inhale 1-2 puffs into the lungs every 6 (six) hours as needed for wheezing or shortness of breath.    [provider]  Azelastine HCl 137 MCG/SPRAY SOLN Place 2 sprays into both nostrils 2 (two) times daily as needed for allergies. 04/17/21   [provider]  buPROPion ER (WELLBUTRIN SR) 100 MG 12 hr tablet Take 1 tablet (100 mg total) by mouth daily. 08/31/22 09/30/22  Jeannie Fend, PA-C  busPIRone (BUSPAR) 7.5 MG tablet Take 1 tablet (7.5 mg total) by mouth 2 (two) times daily. 08/31/22   Jeannie Fend, PA-C  Cyanocobalamin (B-12) 2500 MCG TABS Take 1 tablet by mouth daily.    [provider]  DULoxetine (CYMBALTA) 30 MG capsule Take 1 capsule (30 mg total) by mouth daily. 06/20/22   Nita Sickle K, DO  folic acid (FOLVITE) 1 MG tablet Take 1 mg by mouth daily. 06/27/21   [provider]  hydroxychloroquine (PLAQUENIL) 200 MG tablet Take 200 mg by mouth at bedtime.    [provider]  lidocaine (LIDODERM) 5 % Place 1 patch onto the skin daily. Remove & Discard patch within 12 hours or as directed by MD 11/22/22   Prosperi, Ephriam Knuckles H, PA-C  methotrexate (RHEUMATREX) 2.5 MG tablet Take 12.5 mg by mouth every Sunday. (5 tablets) 07/19/21   [provider]  metoprolol succinate (TOPROL-XL) 25 MG 24 hr tablet Take 1 tablet by mouth daily. 05/10/22   [provider]  ondansetron (ZOFRAN-ODT) 8 MG disintegrating tablet Take 1 tablet (8 mg total) by mouth every 8 (eight) hours as needed for nausea or vomiting. 11/26/22   [name], MD  pantoprazole (PROTONIX) 20 MG tablet Take 2 tablets (40 mg total) by mouth daily for 10 days. 12/10/22 12/20/22  Alvira Monday, MD  potassium chloride 20 MEQ TBCR Take 1 tablet (20 mEq total) by mouth daily. 10/30/22   Zadie Rhine, MD  promethazine (PHENERGAN) 25 MG suppository Place 1 suppository (25 mg total) rectally every 6 (six) hours as needed for nausea or vomiting. 01/17/23   Renne Crigler, PA-C  Allergies    Nsaids, Carbamazepine, and Lavender oil    Review of Systems   Review of Systems  Constitutional:  Negative for chills and fever.  Respiratory:  Negative for shortness of breath.   Cardiovascular:  Negative for chest pain.  Gastrointestinal:  Positive for abdominal pain.  All other systems reviewed and are negative.   Physical Exam Updated Vital Signs BP 110/66 (BP Location: Right Arm)   Pulse 98   Temp 97.9 F (36.6 C) (Oral)   Resp (!) 23   LMP 01/12/2023 (Exact Date)   SpO2 100%  Physical Exam Vitals and nursing note reviewed.  Constitutional:      General: She is not in  acute distress.    Appearance: She is well-developed.  HENT:     Head: Normocephalic and atraumatic.     Mouth/Throat:     Pharynx: No oropharyngeal exudate.  Eyes:     Pupils: Pupils are equal, round, and reactive to light.  Cardiovascular:     Rate and Rhythm: Regular rhythm.     Heart sounds: Normal heart sounds.  Pulmonary:     Effort: Pulmonary effort is normal. No respiratory distress.     Breath sounds: Normal breath sounds.  Abdominal:     General: Bowel sounds are normal. There is no distension.     Palpations: Abdomen is soft.     Tenderness: There is generalized abdominal tenderness. There is no right CVA tenderness or left CVA tenderness.  Musculoskeletal:        General: No tenderness or deformity.     Cervical back: Normal range of motion.     Right lower leg: No edema.     Left lower leg: No edema.  Skin:    General: Skin is warm and dry.  Neurological:     Mental Status: She is alert and oriented to person, place, and time.     ED Results / Procedures / Treatments   Labs (all labs ordered are listed, but only abnormal results are displayed) Labs Reviewed  CBC WITH DIFFERENTIAL/PLATELET - Abnormal; Notable for the following components:      Result Value   Hemoglobin 16.5 (*)    MCH 34.5 (*)    MCHC 36.1 (*)    Platelets 477 (*)    All other components within normal limits  COMPREHENSIVE METABOLIC PANEL - Abnormal; Notable for the following components:   Sodium 131 (*)    Potassium 3.2 (*)    Chloride 89 (*)    All other components within normal limits  LIPASE, BLOOD - Abnormal; Notable for the following components:   Lipase <10 (*)    All other components within normal limits  URINALYSIS, ROUTINE W REFLEX MICROSCOPIC - Abnormal; Notable for the following components:   Color, Urine COLORLESS (*)    Ketones, ur 15 (*)    All other components within normal limits  PREGNANCY, URINE    EKG None  Radiology No results  found.  Procedures Procedures    Medications Ordered in ED Medications  sodium chloride 0.9 % bolus 1,000 mL (0 mLs Intravenous Stopped 01/17/23 1922)  potassium chloride 10 mEq in 100 mL IVPB (0 mEq Intravenous Stopped 01/17/23 1922)  droperidol (INAPSINE) 2.5 MG/ML injection 2.5 mg (2.5 mg Intravenous Given 01/17/23 1918)    ED Course/ Medical Decision Making/ A&P  Medical Decision Making Amount and/or Complexity of Data Reviewed Labs: ordered.  Risk Prescription drug management.   This patient presents to the ED for concern of abdominal pain, this involves a number of treatment options, and is a complaint that carries with it a highi risk of complications and morbidity.  The differential diagnosis includes cholecystitis, bowel obstruction, CHS.    Co morbidities: Discussed in HPI   Brief History:  See HPI.   EMR reviewed including pt PMHx, past surgical history and past visits to ER.   See HPI for more details   Lab Tests:  I ordered and independently interpreted labs.  The pertinent results include:    Labs notable for [facility] with no leukocytosis, hemoconcentrated.  CMP with slight decrease in her potassium, this was IV replaced with 1 round.  Creatinine levels unremarkable.  LFTs are within normal limits.  Lipase level is normal.  Pregnancy test is negative.  When   Imaging Studies:  No imaging studies ordered for this patient   Medicines ordered:  I ordered medication including potassium, bolus  for symptomatic treatment Reevaluation of the patient after these medicines showed that the patient improved I have reviewed the patients home medicines and have made adjustments as needed  Reevaluation:  Pending reevaluation.   Social Determinants of Health:  The patient's social determinants of health were a factor in the care of this patient  Problem List / ED Course:  Patient here with underlying history of RA, cyclical  vomiting presenting with nausea, vomiting, abdominal pain that is been ongoing for the past 5 days.  She does reports she stopped vomiting on Sunday, has been using Reglan suppositories with improvement in her symptoms.  She continues to endorse generalized abdominal pain without any focal point of tenderness.  Her episodes were nonbilious, nonbloody.  Her labs are remarkable for some mild hypokalemia, suspect due to her ongoing vomiting.  Her creatinine levels unremarkable, LFTs are within normal limits and there is no pain along the right upper quadrant to suggest gallbladder etiology.  Pregnancy test is negative.  Lipase level is normal.  CBC with no leukocytosis, hemoconcentrated.  Given potassium replacement, bolus to help with symptomatic treatment. She was given an IV round of potassium, she was also given droperidol after EKG with a QTc in the 430s. Will sign out care to incoming team pending reassessment.   Dispostion:  Patient care signed out pending reassessment.    Portions of this note were generated with Dragon dictation software. Dictation errors may occur despite best attempts at proofreading.   Final Clinical Impression(s) / ED Diagnoses Final diagnoses:  Generalized abdominal pain  Hypokalemia    Rx / DC Orders ED Discharge Orders          Ordered    promethazine (PHENERGAN) 25 MG suppository  Every 6 hours PRN        09 /24/24 1957              Claude Manges, PA-C 01/18/23 1801    Charlynne Pander, MD 01/18/23 [phone]

## 2023-01-17 NOTE — Discharge Instructions (Signed)
Please continue your home medications, return with worsening abdominal pain, uncontrolled vomiting, fevers, new symptoms or other concerns.  Please follow-up with your doctor in the next 3 days for recheck of your symptoms to ensure they are improving.

## 2023-01-17 NOTE — ED Triage Notes (Signed)
Abdo pain, nausea, cramping Body aches Since Thursday  Took phenergan suppose with helps some

## 2023-01-17 NOTE — ED Provider Notes (Signed)
Signout from [employer] at shift change. Briefly, patient presents for uncontrolled nausea and vomiting.  History of similar.   Plan: She is receiving droperidol currently for symptom control, will need reassessment.  7:58 PM Reassessment performed. Patient appears well.  She is requesting discharge.  She is requesting prescription for Phenergan suppositories.  She has potassium supplements at home.  Labs and imaging personally reviewed and interpreted including: CBC with concentrated hemoglobin 16.5, normal white blood cell count 10.3; CMP with potassium 3.2, sodium 131, normal kidney function; lipase less than 10; UA without signs of infection.   Most current vital signs reviewed and are as follows: BP 103/71 (BP Location: Right Arm)   Pulse 99   Temp 98.3 F (36.8 C) (Oral)   Resp 20   LMP 01/12/2023 (Exact Date)   SpO2 96%   Plan: Discharge   Home treatment: Clear liquid to bland diet, continued use of antiemetics   Return and follow-up instructions: The patient was urged to return to the Emergency Department immediately with worsening of current symptoms, worsening abdominal pain, persistent vomiting, blood noted in stools, fever, or any other concerns.   Encouraged patient to follow-up with their provider in 3 days. Patient verbalized understanding and agreed with plan.        Renne Crigler, PA-C 01/17/23 2001    Charlynne Pander, MD 01/17/23 [phone]

## 2023-03-09 ENCOUNTER — Emergency Department (HOSPITAL_COMMUNITY)
Admission: EM | Admit: 2023-03-09 | Discharge: 2023-03-09 | Disposition: A | Discharge status: Home / Self Care | Payer: MEDICAID | Attending: Emergency Medicine | Admitting: Emergency Medicine

## 2023-03-09 ENCOUNTER — Encounter (HOSPITAL_COMMUNITY): Payer: Self-pay | Admitting: Emergency Medicine

## 2023-03-09 ENCOUNTER — Other Ambulatory Visit: Payer: Self-pay

## 2023-03-09 DIAGNOSIS — J45909 Unspecified asthma, uncomplicated: Secondary | ICD-10-CM | POA: Insufficient documentation

## 2023-03-09 DIAGNOSIS — R1115 Cyclical vomiting syndrome unrelated to migraine: Secondary | ICD-10-CM | POA: Insufficient documentation

## 2023-03-09 DIAGNOSIS — E860 Dehydration: Secondary | ICD-10-CM | POA: Diagnosis not present

## 2023-03-09 DIAGNOSIS — E871 Hypo-osmolality and hyponatremia: Secondary | ICD-10-CM | POA: Diagnosis not present

## 2023-03-09 DIAGNOSIS — E876 Hypokalemia: Secondary | ICD-10-CM | POA: Insufficient documentation

## 2023-03-09 DIAGNOSIS — R000 Tachycardia, unspecified: Secondary | ICD-10-CM | POA: Diagnosis not present

## 2023-03-09 DIAGNOSIS — R1110 Vomiting, unspecified: Secondary | ICD-10-CM | POA: Diagnosis present

## 2023-03-09 LAB — URINALYSIS, ROUTINE W REFLEX MICROSCOPIC
Bilirubin Urine: NEGATIVE
Glucose, UA: NEGATIVE mg/dL
Hgb urine dipstick: NEGATIVE
Ketones, ur: 80 mg/dL — AB
Leukocytes,Ua: NEGATIVE
Nitrite: NEGATIVE
Protein, ur: 30 mg/dL — AB
Specific Gravity, Urine: 1.025 (ref 1.005–1.030)
pH: 6 (ref 5.0–8.0)

## 2023-03-09 LAB — COMPREHENSIVE METABOLIC PANEL
ALT: 11 U/L (ref 0–44)
AST: 20 U/L (ref 15–41)
Albumin: 4.1 g/dL (ref 3.5–5.0)
Alkaline Phosphatase: 82 U/L (ref 38–126)
Anion gap: 18 — ABNORMAL HIGH (ref 5–15)
BUN: 18 mg/dL (ref 6–20)
CO2: 29 mmol/L (ref 22–32)
Calcium: 9.6 mg/dL (ref 8.9–10.3)
Chloride: 81 mmol/L — ABNORMAL LOW (ref 98–111)
Creatinine, Ser: 1.06 mg/dL — ABNORMAL HIGH (ref 0.44–1.00)
GFR, Estimated: >60 mL/min (ref >60)
Glucose, Bld: 79 mg/dL (ref 70–99)
Potassium: 2.9 mmol/L — ABNORMAL LOW (ref 3.5–5.1)
Sodium: 128 mmol/L — ABNORMAL LOW (ref 135–145)
Total Bilirubin: 1.3 mg/dL — ABNORMAL HIGH (ref <1.2)
Total Protein: 7.7 g/dL (ref 6.5–8.1)

## 2023-03-09 LAB — LIPASE, BLOOD: Lipase: 25 U/L (ref 11–51)

## 2023-03-09 LAB — CBC
HCT: 46.7 % — ABNORMAL HIGH (ref 36.0–46.0)
Hemoglobin: 16 g/dL — ABNORMAL HIGH (ref 12.0–15.0)
MCH: 32.9 pg (ref 26.0–34.0)
MCHC: 34.3 g/dL (ref 30.0–36.0)
MCV: 95.9 fL (ref 80.0–100.0)
Platelets: 503 10*3/uL — ABNORMAL HIGH (ref 150–400)
RBC: 4.87 MIL/uL (ref 3.87–5.11)
RDW: 12.7 % (ref 11.5–15.5)
WBC: 15.5 10*3/uL — ABNORMAL HIGH (ref 4.0–10.5)
nRBC: 0 % (ref 0.0–0.2)

## 2023-03-09 LAB — HCG, SERUM, QUALITATIVE: Preg, Serum: NEGATIVE

## 2023-03-09 MED ORDER — SODIUM CHLORIDE 0.9 % IV BOLUS
1000.0000 mL | Freq: Once | INTRAVENOUS | Status: AC
  Administered 2023-03-09: 1000 mL via INTRAVENOUS

## 2023-03-09 MED ORDER — LACTATED RINGERS IV BOLUS
1000.0000 mL | Freq: Once | INTRAVENOUS | Status: AC
  Administered 2023-03-09: 1000 mL via INTRAVENOUS

## 2023-03-09 MED ORDER — POTASSIUM CHLORIDE 10 MEQ/100ML IV SOLN
10.0000 meq | Freq: Once | INTRAVENOUS | Status: AC
  Administered 2023-03-09: 10 meq via INTRAVENOUS
  Filled 2023-03-09: qty 100

## 2023-03-09 NOTE — ED Provider Notes (Addendum)
[place] EMERGENCY DEPARTMENT AT [facility] Provider Note   CSN: [id number] Arrival date & time: 03/09/23  4401     History  Chief Complaint  Patient presents with   Emesis    Laura Montoya is a 37 y.o. female.  Patient is a 37 year old female with a history of schizophrenia/bipolar disease, asthma, cyclical vomiting syndrome who is presenting today with complaints of emesis.  Patient reports that vomiting started around noon on Monday which is 4 days prior to arrival.  She has had vomiting every day but did use a Phenergan suppository on Monday and then yesterday.  She does report that the vomiting has slowed down and she last vomited around 2 AM this morning but she has not urinated for over 24 hours until she got here and reports it burns a little bit.  She has some soreness throughout her stomach but no localized pain in her abdomen.  She denies any diarrhea.  She has not held anything down since Monday.  She denies fever, cough, shortness of breath.  Menses have been regular.  The history is provided by the patient.  Emesis      Home Medications Prior to Admission medications   Medication Sig Start Date End Date Taking? Authorizing Provider  albuterol (PROVENTIL HFA;VENTOLIN HFA) 108 (90 Base) MCG/ACT inhaler Inhale 1-2 puffs into the lungs every 6 (six) hours as needed for wheezing or shortness of breath.    [provider]  Azelastine HCl 137 MCG/SPRAY SOLN Place 2 sprays into both nostrils 2 (two) times daily as needed for allergies. 04/17/21   [provider]  buPROPion ER (WELLBUTRIN SR) 100 MG 12 hr tablet Take 1 tablet (100 mg total) by mouth daily. 08/31/22 09/30/22  Jeannie Fend, PA-C  busPIRone (BUSPAR) 7.5 MG tablet Take 1 tablet (7.5 mg total) by mouth 2 (two) times daily. 08/31/22   Jeannie Fend, PA-C  Cyanocobalamin (B-12) 2500 MCG TABS Take 1 tablet by mouth daily.    [provider]  DULoxetine (CYMBALTA) 30 MG  capsule Take 1 capsule (30 mg total) by mouth daily. 06/20/22   Nita Sickle K, DO  folic acid (FOLVITE) 1 MG tablet Take 1 mg by mouth daily. 06/27/21   [provider]  hydroxychloroquine (PLAQUENIL) 200 MG tablet Take 200 mg by mouth at bedtime.    [provider]  lidocaine (LIDODERM) 5 % Place 1 patch onto the skin daily. Remove & Discard patch within 12 hours or as directed by MD 11/22/22   Prosperi, Ephriam Knuckles H, PA-C  methotrexate (RHEUMATREX) 2.5 MG tablet Take 12.5 mg by mouth every Sunday. (5 tablets) 07/19/21   [provider]  metoprolol succinate (TOPROL-XL) 25 MG 24 hr tablet Take 1 tablet by mouth daily. 05/10/22   [provider]  ondansetron (ZOFRAN-ODT) 8 MG disintegrating tablet Take 1 tablet (8 mg total) by mouth every 8 (eight) hours as needed for nausea or vomiting. 11/26/22   [name], MD  pantoprazole (PROTONIX) 20 MG tablet Take 2 tablets (40 mg total) by mouth daily for 10 days. 12/10/22 12/20/22  Alvira Monday, MD  potassium chloride 20 MEQ TBCR Take 1 tablet (20 mEq total) by mouth daily. 10/30/22   Zadie Rhine, MD  promethazine (PHENERGAN) 25 MG suppository Place 1 suppository (25 mg total) rectally every 6 (six) hours as needed for nausea or vomiting. 01/17/23   Renne Crigler, PA-C      Allergies    Nsaids, Carbamazepine, and Lavender  oil    Review of Systems   Review of Systems  Gastrointestinal:  Positive for vomiting.    Physical Exam Updated Vital Signs BP 138/88 (BP Location: Left Arm)   Pulse 90   Temp 98.8 F (37.1 C) (Oral)   Resp 20   SpO2 99%  Physical Exam Vitals and nursing note reviewed.  Constitutional:      General: She is not in acute distress.    Appearance: She is well-developed.  HENT:     Head: Normocephalic and atraumatic.     Mouth/Throat:     Mouth: Mucous membranes are dry.  Eyes:     Pupils: Pupils are equal, round, and reactive to light.  Cardiovascular:     Rate and Rhythm: Regular  rhythm. Tachycardia present.     Heart sounds: Normal heart sounds. No murmur heard.    No friction rub.  Pulmonary:     Effort: Pulmonary effort is normal.     Breath sounds: Normal breath sounds. No wheezing or rales.  Abdominal:     General: Bowel sounds are normal. There is no distension.     Palpations: Abdomen is soft.     Tenderness: There is abdominal tenderness. There is no right CVA tenderness, left CVA tenderness, guarding or rebound.     Comments: Mild diffuse tenderness but no rebound or guarding  Musculoskeletal:        General: No tenderness. Normal range of motion.     Comments: No edema  Skin:    General: Skin is warm and dry.     Findings: No rash.  Neurological:     Mental Status: She is alert and oriented to person, place, and time.     Cranial Nerves: No cranial nerve deficit.  Psychiatric:        Behavior: Behavior normal.     ED Results / Procedures / Treatments   Labs (all labs ordered are listed, but only abnormal results are displayed) Labs Reviewed  COMPREHENSIVE METABOLIC PANEL - Abnormal; Notable for the following components:      Result Value   Sodium 128 (*)    Potassium 2.9 (*)    Chloride 81 (*)    Creatinine, Ser 1.06 (*)    Total Bilirubin 1.3 (*)    Anion gap 18 (*)    All other components within normal limits  CBC - Abnormal; Notable for the following components:   WBC 15.5 (*)    Hemoglobin 16.0 (*)    HCT 46.7 (*)    Platelets 503 (*)    All other components within normal limits  URINALYSIS, ROUTINE W REFLEX MICROSCOPIC - Abnormal; Notable for the following components:   Color, Urine AMBER (*)    APPearance HAZY (*)    Ketones, ur 80 (*)    Protein, ur 30 (*)    Bacteria, UA RARE (*)    All other components within normal limits  LIPASE, BLOOD  HCG, SERUM, QUALITATIVE    EKG None  Radiology No results found.  Procedures Procedures    Medications Ordered in ED Medications  lactated ringers bolus 1,000 mL (0 mLs  Intravenous Stopped 03/09/23 1150)  potassium chloride 10 mEq in 100 mL IVPB (0 mEq Intravenous Stopped 03/09/23 1145)  sodium chloride 0.9 % bolus 1,000 mL (0 mLs Intravenous Stopped 03/09/23 1150)    ED Course/ Medical Decision Making/ A&P  Medical Decision Making Amount and/or Complexity of Data Reviewed Labs: ordered. Decision-making details documented in ED Course.  Risk Prescription drug management.   Pt with multiple medical problems and comorbidities and presenting today with a complaint that caries a high risk for morbidity and mortality.  Here today with recurrent emesis.  Concern for exacerbation of her cyclical vomiting.  Concern for electrolyte abnormalities and AKI.  Patient appears dehydrated on exam.  Lower suspicion for appendicitis, diverticulitis, bowel perforation, cholecystitis.  Will rule out pancreatitis.  Low suspicion for urinary or vaginal pathology.  Patient reports she is not sexually active and lower suspicion for pregnancy.  Patient reports she is not currently nauseated.  However upon arrival she was tachycardic but blood pressure has been stable.  Will give IV fluids.  I independently interpreted patient's labs and CBC today with a leukocytosis of 15, UA with ketones but no evidence of infection.  Lipase within normal limits. CMP today with hyponatremia of 128 and hypokalemia of 2.9 with mild AKI with creatinine of 1.06 from her baseline of 0.8.  Will continue IV fluids and potassium replacement.  12:53 PM On reevaluation patient improved.  Tolerating p.o.'s.  At this time stable for discharge.  CRITICAL CARE Performed by: Danta Baumgardner Total critical care time: 30 minutes Critical care time was exclusive of separately billable procedures and treating other patients. Critical care was necessary to treat or prevent imminent or life-threatening deterioration. Critical care was time spent personally by me on the following  activities: development of treatment plan with patient and/or surrogate as well as nursing, discussions with consultants, evaluation of patient's response to treatment, examination of patient, obtaining history from patient or surrogate, ordering and performing treatments and interventions, ordering and review of laboratory studies, ordering and review of radiographic studies, pulse oximetry and re-evaluation of patient's condition.         Final Clinical Impression(s) / ED Diagnoses Final diagnoses:  Cyclical vomiting syndrome not associated with migraine  Hyponatremia  Hypokalemia  Dehydration    Rx / DC Orders ED Discharge Orders     None         Gwyneth Sprout, MD 03/09/23 1253    Gwyneth Sprout, MD 03/09/23 1254

## 2023-03-09 NOTE — ED Triage Notes (Signed)
Pt reports emesis that started on Monday. Denies fevers and denies pain. Reports decreased urinary output.

## 2023-03-20 ENCOUNTER — Encounter: Payer: Self-pay | Admitting: Gastroenterology

## 2023-05-24 ENCOUNTER — Other Ambulatory Visit: Payer: Self-pay | Admitting: Physician Assistant

## 2023-05-24 DIAGNOSIS — R1032 Left lower quadrant pain: Secondary | ICD-10-CM

## 2023-05-30 ENCOUNTER — Ambulatory Visit
Admission: RE | Admit: 2023-05-30 | Discharge: 2023-05-30 | Disposition: A | Discharge status: Home / Self Care | Payer: MEDICAID | Source: Ambulatory Visit | Attending: Physician Assistant | Admitting: Physician Assistant

## 2023-05-30 DIAGNOSIS — R1032 Left lower quadrant pain: Secondary | ICD-10-CM

## 2023-05-30 MED ORDER — IOPAMIDOL (ISOVUE-300) INJECTION 61%
100.0000 mL | Freq: Once | INTRAVENOUS | Status: AC | PRN
  Administered 2023-05-30: 100 mL via INTRAVENOUS

## 2023-06-07 ENCOUNTER — Ambulatory Visit: Payer: MEDICAID | Admitting: Gastroenterology

## 2023-06-26 ENCOUNTER — Encounter: Payer: Self-pay | Admitting: Neurology

## 2023-06-26 ENCOUNTER — Ambulatory Visit: Payer: MEDICAID | Admitting: Neurology

## 2023-07-04 ENCOUNTER — Other Ambulatory Visit: Payer: MEDICAID

## 2023-07-04 ENCOUNTER — Ambulatory Visit: Payer: MEDICAID | Admitting: Neurology

## 2023-07-04 VITALS — BP 97/68 | HR 92 | Ht 62.0 in | Wt 114.0 lb

## 2023-07-04 DIAGNOSIS — E5111 Dry beriberi: Secondary | ICD-10-CM

## 2023-07-04 MED ORDER — DULOXETINE HCL 30 MG PO CPEP: 30.0000 mg | ORAL_CAPSULE | Freq: Two times a day (BID) | ORAL | 3 refills | Status: AC

## 2023-07-04 NOTE — Progress Notes (Signed)
 Follow-up Visit   Date: 07/04/2023    Deniya Craigo MRN: [id number] DOB: 06-28-1985    Laura Montoya is a 38 y.o.  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  The patient was accompanied to the clinic by daughter who also provides collateral information.    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  Today, she presents with worsening tingling in the hands and numbness in the feet. Symptoms are constant.   She continues to have imbalance, but is able to walk unassisted.  No weakness. She is taking B12, folic acid, biotin, and vitamin D3.  Rare alcohol use.  She is no longer taking Lyrica.   UPDATE 06/20/2022:  She is here for 6 month follow-up.  She continues to have numbness in the feet and lower legs.  No weakness, falls, or burning/stabbing pain.  Her thiamine level was undetectable at her last visit and she has restarted thiamine supplements.  She was taking it more regularly but no takes it once per week.  No new complaints.  She is feeling well.   UPDATE 07/04/2023:  She is here for 1 year follow-up visit.  She reports having worsening sharp pain and in tingling in the legs at times.  Numbness is constant and unchanged.  She takes cymbalta 30mg  daily.  She has lost more weight over the past year, but reports to eating well and taking supplements.   Medications:   Current Outpatient Medications on File Prior to Visit  Medication Sig Dispense Refill   albuterol (PROVENTIL HFA;VENTOLIN HFA) 108 (90 Base) MCG/ACT inhaler Inhale 1-2 puffs into the lungs every 6 (six) hours as needed for wheezing or shortness of breath.     Azelastine HCl 137 MCG/SPRAY SOLN Place 2 sprays into both nostrils 2 (two) times daily as needed for allergies.     buPROPion ER (WELLBUTRIN SR) 100 MG 12 hr tablet Take 1 tablet (100 mg total) by mouth daily. 30 tablet 0   busPIRone (BUSPAR) 7.5 MG tablet Take 1 tablet (7.5 mg total) by mouth 2 (two) times daily. 60 tablet 0   Cyanocobalamin (B-12) 2500 MCG TABS Take 1 tablet by mouth daily.     DULoxetine (CYMBALTA) 30 MG capsule Take 1 capsule (30 mg total) by mouth daily. 90 capsule 3   folic acid (FOLVITE) 1 MG tablet Take 1 mg by mouth daily.     hydroxychloroquine (PLAQUENIL) 200 MG tablet Take 200 mg by mouth at bedtime.     lidocaine (LIDODERM) 5 % Place 1 patch onto the skin daily. Remove & Discard patch within 12 hours or as directed by MD 30 patch 0   methotrexate (RHEUMATREX) 2.5 MG tablet Take 12.5 mg by mouth every Sunday. (5 tablets)     metoprolol succinate (TOPROL-XL) 25 MG 24 hr tablet Take 1 tablet by mouth daily.     ondansetron (ZOFRAN-ODT) 8 MG disintegrating tablet Take 1 tablet (8 mg total) by mouth every 8 (eight) hours as needed for nausea or  vomiting. 20 tablet 0   pantoprazole (PROTONIX) 20 MG tablet Take 2 tablets (40 mg total) by mouth daily for 10 days. 20 tablet 0   potassium chloride 20 MEQ TBCR Take 1 tablet (20 mEq total) by mouth daily. 5 tablet 0   promethazine (PHENERGAN) 25 MG suppository Place 1 suppository (25 mg total) rectally every 6 (six) hours as needed for nausea or vomiting. 12 each 0   No current facility-administered medications on file prior to visit.    Allergies:  Allergies  Allergen Reactions   Nsaids Anaphylaxis    4.26.2023 Pt reports that she is currently taking Celebrex    Carbamazepine Itching and Hives   Lavender Oil Rash    Vital Signs:  There were no vitals taken for this visit.  Neurological Exam: MENTAL STATUS including orientation to time, place, person, recent and remote memory, attention span and concentration, language, and fund of knowledge is normal.  Speech is not dysarthric.  CRANIAL NERVES:  Pupils equal round and reactive to light.  Normal conjugate, extra-ocular eye movements in all directions of gaze.  No ptosis .  Face is symmetric. Palate elevates symmetrically.  Tongue is midline.  MOTOR:  Motor strength is 5/5 in all extremities, including distally in the feet.  No atrophy, fasciculations or abnormal movements.  No pronator drift.  Tone is normal.    MSRs:  Reflexes are 2+/4 in the arms and absent in the legs.  SENSORY:  Vibration is mildly reduced at the ankles bilaterally, otherwise intact. Marland Kitchen  COORDINATION/GAIT:  Gait is mildly antalgic, unassisted stable.  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  Sensory responses show improvement from prior study on 07/07/2015. A superimposed left carpal tunnel syndrome, cannot be excluded.  Correlate clinically.   Labs 12/13/2021:  vitamin B12 862, folate > 24, B1 <6, TSH 0.39, copper 81, SPEP with IFE no M protein   Thank you for allowing me to participate in patient's care.  If I can answer any additional questions, I would be pleased to do so.    Sincerely,    Cameran Pettey K. Allena Katz, DO

## 2023-07-07 LAB — VITAMIN B1: Vitamin B1 (Thiamine): 6 nmol/L — ABNORMAL LOW (ref 8–30)

## 2023-07-07 LAB — B12 AND FOLATE PANEL
Folate: 7.8 ng/mL
Vitamin B-12: 340 pg/mL (ref 200–1100)

## 2023-07-24 ENCOUNTER — Ambulatory Visit: Payer: MEDICAID | Admitting: Gastroenterology

## 2023-07-24 ENCOUNTER — Encounter: Payer: Self-pay | Admitting: Gastroenterology

## 2023-07-24 VITALS — BP 100/60 | HR 68 | Ht 62.0 in | Wt 113.4 lb

## 2023-07-24 DIAGNOSIS — R1013 Epigastric pain: Secondary | ICD-10-CM

## 2023-07-24 DIAGNOSIS — R112 Nausea with vomiting, unspecified: Secondary | ICD-10-CM | POA: Diagnosis not present

## 2023-07-24 DIAGNOSIS — R197 Diarrhea, unspecified: Secondary | ICD-10-CM

## 2023-07-24 DIAGNOSIS — K219 Gastro-esophageal reflux disease without esophagitis: Secondary | ICD-10-CM | POA: Diagnosis not present

## 2023-07-24 DIAGNOSIS — R195 Other fecal abnormalities: Secondary | ICD-10-CM

## 2023-07-24 MED ORDER — PANTOPRAZOLE SODIUM 40 MG PO TBEC: 40.0000 mg | DELAYED_RELEASE_TABLET | Freq: Every day | ORAL | 3 refills | Status: DC

## 2023-07-24 NOTE — Progress Notes (Signed)
 Chief Complaint: Nausea, vomiting, epigastric pain Primary GI MD: Dr. Adela Lank  HPI: 38 year old female history of bipolar, anxiety, GERD, presents for nausea, vomiting, epigastric pain  Last seen 09/2016 by Dr. Adela Lank. See note for details. At this time she was having breakthrough GERD on low dose protonix 20mg  so she was increased to 40mg . She was also having loose stools and recommended to use imodium and stop marijuana use.  Labs 03/2023 including CBC, CMP, CRP, and ESR all normal.  CT ab pelvis with contrast 05/2023 for LLQ pain: no acute findings. Normal gallbladder. Colon without inflammation.  Discussed the use of AI scribe software for clinical note transcription with the patient, who gave verbal consent to proceed.  She has been experiencing recurrent episodes of vomiting for about a year and a half, occurring unpredictably and often preceded by upper abdominal pain. The vomiting is not consistently associated with eating, although it sometimes occurs after meals. Her eating pattern is variable, with periods of increased hunger and times when she eats very little. During these episodes, she tends to eat small meals frequently or one large meal a day with snacking in between.  She has a history of being told she might have cannabinoid hyperemesis syndrome. She has been smoking for twenty years, but the vomiting only started a few years ago. She noticed that the vomiting was worse when she was vaping, which she has since stopped. However, she continues to use herbal cannabis.  She experiences gastroesophageal reflux disease (GERD) symptoms, particularly at night, with acid reflux when lying down. She takes Protonix 40 mg but not on a daily basis. She avoids spicy foods and has reduced her intake of fatty and greasy foods, although she finds it challenging.  She reports episodes of diarrhea following vomiting spells, lasting about two days. She uses Imodium to manage these  symptoms.  She has undergone extensive prior workup including a CT scan, endoscopy, and ultrasound, all of which were normal.  She takes Protonix but does not do so on a daily basis    PREVIOUS GI WORKUP   [name] negative   CT abdomen 07/24/16 - patchy ground glass opacity in the left lung base - f/u CT in 3-6 months recommended US abdomen 07/21/16 - normal GB, no pathology CT abdomen 04/28/15 - normal   EGD 05/15/2015 - 2cm hiatal hernia, mild gastritis - normal biopsies, negative for HP, negative biopsies for EoE  Past Medical History:  Diagnosis Date   Anxiety    Asthma    Bipolar depression (HCC)    Dizziness    Dysmenorrhea    GERD (gastroesophageal reflux disease)    Headache    Mood disorder (HCC)    RA (rheumatoid arthritis) (HCC)    Schizophrenia (HCC)    Schizophrenia (HCC)    Scoliosis    Thrush     Past Surgical History:  Procedure Laterality Date   BREAST ENHANCEMENT SURGERY  2020   BREAST IMPLANT REMOVAL     MOUTH SURGERY  2012   NERVE REPAIR      Current Outpatient Medications  Medication Sig Dispense Refill   albuterol (PROVENTIL HFA;VENTOLIN HFA) 108 (90 Base) MCG/ACT inhaler Inhale 1-2 puffs into the lungs every 6 (six) hours as needed for wheezing or shortness of breath.     Azelastine HCl 137 MCG/SPRAY SOLN Place 2 sprays into both nostrils 2 (two) times daily as needed for allergies.     buPROPion (WELLBUTRIN XL) 150 MG 24 hr tablet Take 150  mg by mouth daily.     Cyanocobalamin (B-12) 2500 MCG TABS Take 1 tablet by mouth daily.     dicyclomine (BENTYL) 10 MG capsule Take 10 mg by mouth as needed.     DULoxetine (CYMBALTA) 30 MG capsule Take 1 capsule (30 mg total) by mouth 2 (two) times daily. 180 capsule 3   fluticasone (FLONASE) 50 MCG/ACT nasal spray Place 1 spray into both nostrils as needed.     folic acid (FOLVITE) 1 MG tablet Take 1 mg by mouth daily.     hydrochlorothiazide (HYDRODIURIL) 12.5 MG tablet Take 12.5 mg by mouth as needed.      hydroxychloroquine (PLAQUENIL) 200 MG tablet Take 200 mg by mouth at bedtime.     hydrOXYzine (ATARAX) 50 MG tablet Take 50 mg by mouth as needed.     lidocaine (LIDODERM) 5 % Place 1 patch onto the skin daily. Remove & Discard patch within 12 hours or as directed by MD 30 patch 0   loratadine (CLARITIN) 10 MG tablet Take 10 mg by mouth as needed.     methotrexate (RHEUMATREX) 2.5 MG tablet Take 12.5 mg by mouth every Sunday. (5 tablets)     metoprolol succinate (TOPROL-XL) 50 MG 24 hr tablet Take 50 mg by mouth daily.     ondansetron (ZOFRAN-ODT) 8 MG disintegrating tablet Take 1 tablet (8 mg total) by mouth every 8 (eight) hours as needed for nausea or vomiting. 20 tablet 0   potassium chloride 20 MEQ TBCR Take 1 tablet (20 mEq total) by mouth daily. 5 tablet 0   promethazine (PHENERGAN) 25 MG suppository Place 1 suppository (25 mg total) rectally every 6 (six) hours as needed for nausea or vomiting. 12 each 0   thiamine (VITAMIN B1) 100 MG tablet Take 100 mg by mouth as needed.     tiZANidine (ZANAFLEX) 4 MG tablet Take 2-4 mg by mouth 3 (three) times daily as needed.     traZODone (DESYREL) 50 MG tablet Take 50 mg by mouth at bedtime as needed.     pantoprazole (PROTONIX) 40 MG tablet Take 1 tablet (40 mg total) by mouth daily. 90 tablet 3   No current facility-administered medications for this visit.    Allergies as of 07/24/2023 - Review Complete 07/24/2023  Allergen Reaction Noted   Nsaids Anaphylaxis 03/30/2021   Carbamazepine Itching and Hives 05/12/2020    Family History  Problem Relation Age of Onset   Thyroid disease Mother    Asthma Mother    Diabetes Mother    Mental illness Father    Hyperlipidemia Father    Crohn's disease Brother    Diabetes Maternal Grandfather    Heart disease Paternal Grandmother    Colon cancer Paternal Grandmother    Uterine cancer Paternal Grandmother    Heart disease Paternal Grandfather    Anxiety disorder Daughter    Autism Daughter     Breast cancer Maternal Great-grandmother     Social History   Socioeconomic History   Marital status: Divorced    Spouse name: Not on file   Number of children: 1   Years of education: GED   Highest education level: Not on file  Occupational History   Occupation: N/A  Tobacco Use   Smoking status: Every Day    Current packs/day: 0.75    Average packs/day: 0.8 packs/day for 21.0 years (15.8 ttl pk-yrs)    Types: Cigarettes   Smokeless tobacco: Never  Vaping Use   Vaping status: Former  Devices: uses cbd  Substance and Sexual Activity   Alcohol use: Yes    Comment: occasional   Drug use: Yes    Types: Marijuana   Sexual activity: Not on file  Other Topics Concern   Not on file  Social History Narrative   Has not had caffeine for 1 month 05/27/15.  Lives with parents and daughter in a one story home.  Does not work.  Has a disability hearing coming up on July 16, 2015.     Worked as a Conservation officer, nature for [employer]. Has not worked since 2015.     Education: GED      right handed   Lives in one story home    Lives with family   Social Drivers of Health   Financial Resource Strain: Not on file  Food Insecurity: Not on file  Transportation Needs: Not on file  Physical Activity: Not on file  Stress: Not on file  Social Connections: Not on file  Intimate Partner Violence: Not on file    Review of Systems:    Constitutional: No weight loss, fever, chills, weakness or fatigue HEENT: Eyes: No change in vision               Ears, Nose, Throat:  No change in hearing or congestion Skin: No rash or itching Cardiovascular: No chest pain, chest pressure or palpitations   Respiratory: No SOB or cough Gastrointestinal: See HPI and otherwise negative Genitourinary: No dysuria or change in urinary frequency Neurological: No headache, dizziness or syncope Musculoskeletal: No new muscle or joint pain Hematologic: No bleeding or bruising Psychiatric: No history of depression or anxiety     Physical Exam:  Vital signs: BP 100/60 (BP Location: Left Arm, Patient Position: Sitting, Cuff Size: Normal)   Pulse 68   Ht 5\' 2"  (1.575 m) Comment: height measured without shoes  Wt 113 lb 6 oz (51.4 kg)   LMP 07/17/2023   BMI 20.74 kg/m   Constitutional: NAD, Well developed, Well nourished, alert and cooperative Head:  Normocephalic and atraumatic. Eyes:   PEERL, EOMI. No icterus. Conjunctiva pink. Respiratory: Respirations even and unlabored. Lungs clear to auscultation bilaterally.   No wheezes, crackles, or rhonchi.  Cardiovascular:  Regular rate and rhythm. No peripheral edema, cyanosis or pallor.  Gastrointestinal:  Soft, nondistended, nontender. No rebound or guarding. Normal bowel sounds. No appreciable masses or hepatomegaly. Rectal:  Not performed.  Msk:  Symmetrical without gross deformities. Without edema, no deformity or joint abnormality.  Neurologic:  Alert and  oriented x4;  grossly normal neurologically.  Skin:   Dry and intact without significant lesions or rashes. Psychiatric: Oriented to person, place and time. Demonstrates good judgement and reason without abnormal affect or behaviors.   RELEVANT LABS AND IMAGING: CBC    Component Value Date/Time   WBC 15.5 (H) 03/09/2023 0847   RBC 4.87 03/09/2023 0847   HGB 16.0 (H) 03/09/2023 0847   HCT 46.7 (H) 03/09/2023 0847   PLT 503 (H) 03/09/2023 0847   MCV 95.9 03/09/2023 0847   MCH 32.9 03/09/2023 0847   MCHC 34.3 03/09/2023 0847   RDW 12.7 03/09/2023 0847   RDW 13.8 11/15/2013 0928   LYMPHSABS 3.5 01/17/2023 1723   LYMPHSABS 2.7 11/15/2013 0928   MONOABS 0.8 01/17/2023 1723   EOSABS 0.0 01/17/2023 1723   EOSABS 0.4 11/15/2013 0928   BASOSABS 0.1 01/17/2023 1723   BASOSABS 0.1 11/15/2013 0928    CMP     Component Value Date/Time  NA 128 (L) 03/09/2023 0847   NA 142 11/15/2013 0928   K 2.9 (L) 03/09/2023 0847   CL 81 (L) 03/09/2023 0847   CO2 29 03/09/2023 0847   GLUCOSE 79 03/09/2023 0847   BUN  18 03/09/2023 0847   BUN 2 (L) 11/15/2013 0928   CREATININE 1.06 (H) 03/09/2023 0847   CALCIUM 9.6 03/09/2023 0847   PROT 7.7 03/09/2023 0847   PROT 6.6 11/15/2013 0928   ALBUMIN 4.1 03/09/2023 0847   ALBUMIN 4.1 11/15/2013 0928   AST 20 03/09/2023 0847   ALT 11 03/09/2023 0847   ALKPHOS 82 03/09/2023 0847   BILITOT 1.3 (H) 03/09/2023 0847   GFRNONAA >60 03/09/2023 0847   GFRAA >60 10/14/2019 1838     Assessment/Plan:      Recurrent nausea and vomiting Epigastric pain Recurrent vomiting for over 18 months, often with upper abdominal pain. Episodes are unpredictable, but sometimes associated with fatty, greasy foods. Differential diagnosis includes cannabinoid hyperemesis syndrome, cyclical vomiting syndrome, and biliary dyskinesia. Previous CT scan, endoscopy, and ultrasound were normal - Order HIDA scan to assess gallbladder function - Prescribe Protonix 40 mg daily - Advise to avoid eating soon before lying down - Advise to avoid spicy, fatty, and greasy foods - Advised cessation of marijuana  Gastroesophageal reflux disease (GERD) GERD symptoms include nocturnal acid reflux, necessitating sitting up to prevent vomiting. Irregular use of Protonix may exacerbate symptoms. Regular use of Protonix is recommended to manage symptoms. - Prescribe Protonix 40 mg daily - Advise to avoid eating soon before lying down - Advise to avoid spicy, fatty, and greasy foods  Diarrhea Diarrhea occurs post-vomiting episodes, lasting about two days. Managed with Imodium as needed. - Continue Imodium as needed for diarrhea      Boone Master, PA-C [facility] 07/24/2023, 3:30 PM  Cc: Lonie Peak, PA-C

## 2023-07-24 NOTE — Patient Instructions (Addendum)
 You have been scheduled for a HIDA scan at [facility] (1st floor) on 08/08/23. Please arrive 30 minutes prior to your scheduled appointment at  4:09WJ. Make certain not to have anything to eat or drink after midnight prior to your test. Should this appointment date or time not work well for you, please call radiology scheduling at [phone]. Plan to have someone drive you to and from your appointment. _____________________________________________________________________ hepatobiliary (HIDA) scan is an imaging procedure used to diagnose problems in the liver, gallbladder and bile ducts. In the HIDA scan, a radioactive chemical or tracer is injected into a vein in your arm. The tracer is handled by the liver like bile. Bile is a fluid produced and excreted by your liver that helps your digestive system break down fats in the foods you eat. Bile is stored in your gallbladder and the gallbladder releases the bile when you eat a meal. A special nuclear medicine scanner (gamma camera) tracks the flow of the tracer from your liver into your gallbladder and small intestine.  During your HIDA scan  You'll be asked to change into a hospital gown before your HIDA scan begins. Your health care team will position you on a table, usually on your back. The radioactive tracer is then injected into a vein in your arm.The tracer travels through your bloodstream to your liver, where it's taken up by the bile-producing cells. The radioactive tracer travels with the bile from your liver into your gallbladder and through your bile ducts to your small intestine.You may feel some pressure while the radioactive tracer is injected into your vein. As you lie on the table, a special gamma camera is positioned over your abdomen taking pictures of the tracer as it moves through your body. The gamma camera takes pictures continually for about an hour. You'll need to keep still during the HIDA scan. This can become uncomfortable,  but you may find that you can lessen the discomfort by taking deep breaths and thinking about other things. Tell your health care team if you're uncomfortable. The radiologist will watch on a computer the progress of the radioactive tracer through your body. The HIDA scan may be stopped when the radioactive tracer is seen in the gallbladder and enters your small intestine. This typically takes about an hour. In some cases extra imaging will be performed if original images aren't satisfactory, if morphine is given to help visualize the gallbladder or if the medication CCK is given to look at the contraction of the gallbladder. This test typically takes 2 hours to complete. ________________________________________________________________________ Due to recent changes in healthcare laws, you may see the results of your imaging and laboratory studies on MyChart before your provider has had a chance to review them.  We understand that in some cases there may be results that are confusing or concerning to you. Not all laboratory results come back in the same time frame and the provider may be waiting for multiple results in order to interpret others.  Please give [name] 48 hours in order for your provider to thoroughly review all the results before contacting the office for clarification of your results.  _______________________________________________________  If your blood pressure at your visit was 140/90 or greater, please contact your primary care physician to follow up on this.  _______________________________________________________  If you are age 26 or older, your body mass index should be between 23-30. Your Body mass index is 20.74 kg/m. If this is out of the aforementioned range listed, please  consider follow up with your Primary Care Provider.  If you are age 12 or younger, your body mass index should be between 19-25. Your Body mass index is 20.74 kg/m. If this is out of the aformentioned range  listed, please consider follow up with your Primary Care Provider.   ________________________________________________________  The [facility] providers would like to encourage you to use [facility] to communicate with providers for non-urgent requests or questions.  Due to long hold times on the telephone, sending your provider a message by [facility] may be a faster and more efficient way to get a response.  Please allow 48 business hours for a response.  Please remember that this is for non-urgent requests.  _______________________________________________________

## 2023-07-24 NOTE — Progress Notes (Signed)
 Agree with assessment and plan as outlined. If no improvement and symptoms persist consideration for TCA or Reglan.

## 2023-08-08 ENCOUNTER — Ambulatory Visit (HOSPITAL_COMMUNITY)
Admission: RE | Admit: 2023-08-08 | Discharge: 2023-08-08 | Disposition: A | Discharge status: Home / Self Care | Payer: MEDICAID | Source: Ambulatory Visit | Attending: Gastroenterology

## 2023-08-08 DIAGNOSIS — R112 Nausea with vomiting, unspecified: Secondary | ICD-10-CM | POA: Diagnosis present

## 2023-08-08 DIAGNOSIS — R1013 Epigastric pain: Secondary | ICD-10-CM | POA: Diagnosis present

## 2023-08-08 MED ORDER — TECHNETIUM TC 99M MEBROFENIN IV KIT
5.0500 | PACK | Freq: Once | INTRAVENOUS | Status: AC | PRN
  Administered 2023-08-08: 5.05 via INTRAVENOUS

## 2023-10-08 ENCOUNTER — Emergency Department (HOSPITAL_COMMUNITY)
Admission: EM | Admit: 2023-10-08 | Discharge: 2023-10-08 | Disposition: A | Discharge status: Home / Self Care | Payer: MEDICAID | Attending: Emergency Medicine | Admitting: Emergency Medicine

## 2023-10-08 ENCOUNTER — Telehealth: Payer: Self-pay

## 2023-10-08 ENCOUNTER — Other Ambulatory Visit: Payer: Self-pay

## 2023-10-08 ENCOUNTER — Encounter (HOSPITAL_COMMUNITY): Payer: Self-pay

## 2023-10-08 DIAGNOSIS — E876 Hypokalemia: Secondary | ICD-10-CM | POA: Insufficient documentation

## 2023-10-08 DIAGNOSIS — D72829 Elevated white blood cell count, unspecified: Secondary | ICD-10-CM | POA: Diagnosis not present

## 2023-10-08 DIAGNOSIS — E871 Hypo-osmolality and hyponatremia: Secondary | ICD-10-CM | POA: Insufficient documentation

## 2023-10-08 DIAGNOSIS — M542 Cervicalgia: Secondary | ICD-10-CM | POA: Diagnosis present

## 2023-10-08 DIAGNOSIS — M549 Dorsalgia, unspecified: Secondary | ICD-10-CM

## 2023-10-08 LAB — COMPREHENSIVE METABOLIC PANEL WITH GFR
ALT: 9 U/L (ref 0–44)
AST: 18 U/L (ref 15–41)
Albumin: 3.7 g/dL (ref 3.5–5.0)
Alkaline Phosphatase: 51 U/L (ref 38–126)
Anion gap: 11 (ref 5–15)
BUN: <5 mg/dL — ABNORMAL LOW (ref 6–20)
CO2: 26 mmol/L (ref 22–32)
Calcium: 9.1 mg/dL (ref 8.9–10.3)
Chloride: 93 mmol/L — ABNORMAL LOW (ref 98–111)
Creatinine, Ser: 0.81 mg/dL (ref 0.44–1.00)
GFR, Estimated: >60 mL/min (ref >60)
Glucose, Bld: 84 mg/dL (ref 70–99)
Potassium: 3.4 mmol/L — ABNORMAL LOW (ref 3.5–5.1)
Sodium: 130 mmol/L — ABNORMAL LOW (ref 135–145)
Total Bilirubin: 0.6 mg/dL (ref 0.0–1.2)
Total Protein: 6.1 g/dL — ABNORMAL LOW (ref 6.5–8.1)

## 2023-10-08 LAB — URINALYSIS, ROUTINE W REFLEX MICROSCOPIC
Bilirubin Urine: NEGATIVE
Glucose, UA: NEGATIVE mg/dL
Hgb urine dipstick: NEGATIVE
Ketones, ur: NEGATIVE mg/dL
Leukocytes,Ua: NEGATIVE
Nitrite: NEGATIVE
Protein, ur: NEGATIVE mg/dL
Specific Gravity, Urine: 1.008 (ref 1.005–1.030)
pH: 6 (ref 5.0–8.0)

## 2023-10-08 LAB — CBC
HCT: 37.8 % (ref 36.0–46.0)
Hemoglobin: 13.6 g/dL (ref 12.0–15.0)
MCH: 35.1 pg — ABNORMAL HIGH (ref 26.0–34.0)
MCHC: 36 g/dL (ref 30.0–36.0)
MCV: 97.7 fL (ref 80.0–100.0)
Platelets: 385 10*3/uL (ref 150–400)
RBC: 3.87 MIL/uL (ref 3.87–5.11)
RDW: 12.7 % (ref 11.5–15.5)
WBC: 10.7 10*3/uL — ABNORMAL HIGH (ref 4.0–10.5)
nRBC: 0 % (ref 0.0–0.2)

## 2023-10-08 LAB — LIPASE, BLOOD: Lipase: 35 U/L (ref 11–51)

## 2023-10-08 LAB — HCG, SERUM, QUALITATIVE: Preg, Serum: NEGATIVE

## 2023-10-08 MED ORDER — HYDROCODONE-ACETAMINOPHEN 5-325 MG PO TABS: 1.0000 | ORAL_TABLET | ORAL | 0 refills | Status: DC | PRN

## 2023-10-08 MED ORDER — METHYLPREDNISOLONE SODIUM SUCC 125 MG IJ SOLR
125.0000 mg | Freq: Once | INTRAMUSCULAR | Status: AC
  Administered 2023-10-08: 125 mg via INTRAMUSCULAR
  Filled 2023-10-08: qty 2

## 2023-10-08 MED ORDER — HYDROMORPHONE HCL 1 MG/ML IJ SOLN
1.0000 mg | Freq: Once | INTRAMUSCULAR | Status: AC
  Administered 2023-10-08: 1 mg via INTRAMUSCULAR
  Filled 2023-10-08: qty 1

## 2023-10-08 NOTE — Discharge Instructions (Signed)
 Return if any problems.

## 2023-10-08 NOTE — Telephone Encounter (Signed)
 Pharmacy called in for dx and ICD code, Myalgia of neck M 79.12

## 2023-10-08 NOTE — ED Triage Notes (Signed)
 Pt states she has had nausea, vomiting, dizziness, hot flashes, headache, and neck/back pain. Pt states this started a week ago.

## 2023-10-08 NOTE — ED Provider Notes (Signed)
 [facility]ERGENC[facility] DEPARTMENT AT [facility] Provider Note   CSN: [id number] Arrival date & time: 10/08/23  0865     Patient presents with: Nausea   Laura Montoya is a 38 y.o. female.   Patient presents to the emergency department with multiple complaints.  Patient reports that she has discomfort in her neck, vomiting dizziness nausea and hot flashes.  Patient states she feels like she is starting menopause.  Patient reports that she has had previous neck pain.  Patient reports she has a history of lumbar spondylosis and scoliosis.  Patient reports that she is not currently vomiting and she is not currently experiencing any nausea.  Patient reports that that has resolved.  Patient reports she has not seen her gynecologist for evaluation she reports her last period was a year ago.  Patient denies any fever or chills she denies any burning with urination she is not having any abdominal pain  The history is provided by the patient. No language interpreter was used.       Prior to Admission medications   Medication Sig Start Date End Date Taking? Authorizing Provider  HYDROcodone -acetaminophen  (NORCO/VICODIN) 5-325 MG tablet Take 1 tablet by mouth every 4 (four) hours as needed. 10/08/23 10/07/24 Yes Caellum Mancil K, PA-C  albuterol (PROVENTIL HFA;VENTOLIN HFA) 108 (90 Base) MCG/ACT inhaler Inhale 1-2 puffs into the lungs every 6 (six) hours as needed for wheezing or shortness of breath.    [provider]  Azelastine HCl 137 MCG/SPRAY SOLN Place 2 sprays into both nostrils 2 (two) times daily as needed for allergies. 04/17/21   [provider]  buPROPion  (WELLBUTRIN  XL) 150 MG 24 hr tablet Take 150 mg by mouth daily. 02/14/23   [provider]  Cyanocobalamin  (B-12) 2500 MCG TABS Take 1 tablet by mouth daily.    [provider]  dicyclomine  (BENTYL ) 10 MG capsule Take 10 mg by mouth as needed. 05/23/23   [provider]  DULoxetine   (CYMBALTA ) 30 MG capsule Take 1 capsule (30 mg total) by mouth 2 (two) times daily. 07/04/23   Patel, Donika K, DO  fluticasone (FLONASE) 50 MCG/ACT nasal spray Place 1 spray into both nostrils as needed. 05/30/22   [provider]  folic acid  (FOLVITE ) 1 MG tablet Take 1 mg by mouth daily. 06/27/21   [provider]  hydrochlorothiazide (HYDRODIURIL) 12.5 MG tablet Take 12.5 mg by mouth as needed. 06/01/23   [provider]  hydroxychloroquine (PLAQUENIL) 200 MG tablet Take 200 mg by mouth at bedtime.    [provider]  hydrOXYzine (ATARAX) 50 MG tablet Take 50 mg by mouth as needed. 02/19/19   [provider]  lidocaine  (LIDODERM ) 5 % Place 1 patch onto the skin daily. Remove & Discard patch within 12 hours or as directed by MD 11/22/22   Prosperi, Lynder Sanger H, PA-C  loratadine (CLARITIN) 10 MG tablet Take 10 mg by mouth as needed. 12/27/22   [provider]  methotrexate (RHEUMATREX) 2.5 MG tablet Take 12.5 mg by mouth every Sunday. (5 tablets) 07/19/21   [provider]  metoprolol succinate (TOPROL-XL) 50 MG 24 hr tablet Take 50 mg by mouth daily. 04/20/22   [provider]  ondansetron  (ZOFRAN -ODT) 8 MG disintegrating tablet Take 1 tablet (8 mg total) by mouth every 8 (eight) hours as needed for nausea or vomiting. 11/26/22   Alissa April, MD  pantoprazole  (PROTONIX ) 40 MG tablet Take 1 tablet (40 mg total) by mouth daily. 07/24/23  McMichael, Bayley M, PA-C  potassium chloride  20 MEQ TBCR Take 1 tablet (20 mEq total) by mouth daily. 10/30/22   Eldon Greenland, MD  promethazine  (PHENERGAN ) 25 MG suppository Place 1 suppository (25 mg total) rectally every 6 (six) hours as needed for nausea or vomiting. 01/17/23   Lyna Sandhoff, PA-C  thiamine  (VITAMIN B1) 100 MG tablet Take 100 mg by mouth as needed. 02/19/19   [provider]  tiZANidine  (ZANAFLEX ) 4 MG tablet Take 2-4 mg by mouth 3 (three) times daily as needed.    [provider]  traZODone (DESYREL) 50 MG tablet Take 50 mg by mouth at bedtime as needed. 07/06/23   [provider]    Allergies: Nsaids and Carbamazepine    Review of Systems  Respiratory:  Negative for cough.   Gastrointestinal:  Positive for nausea and vomiting.  Genitourinary:  Negative for dysuria and enuresis.  Neurological:  Positive for dizziness.  All other systems reviewed and are negative.   Updated Vital Signs BP 130/71   Pulse 60   Temp 98.1 F (36.7 C) (Oral)   Resp 18   Ht 5' 2 (1.575 m)   Wt 47.6 kg   LMP 01/24/2023 (Approximate)   SpO2 100%   BMI 19.20 kg/m   Physical Exam Vitals and nursing note reviewed.  Constitutional:      Appearance: Normal appearance. She is well-developed.  HENT:     Head: Normocephalic.     Nose: Nose normal.     Mouth/Throat:     Mouth: Mucous membranes are moist.   Cardiovascular:     Rate and Rhythm: Normal rate.  Pulmonary:     Effort: Pulmonary effort is normal.  Abdominal:     General: There is no distension.   Musculoskeletal:        General: Normal range of motion.     Cervical back: Normal range of motion and neck supple.     Comments: Patient tender full cervical thoracic and lumbar spine as well as paravertebrals.   Skin:    General: Skin is warm.   Neurological:     General: No focal deficit present.     Mental Status: She is alert and oriented to person, place, and time.     (all labs ordered are listed, but only abnormal results are displayed) Labs Reviewed  COMPREHENSIVE METABOLIC PANEL WITH GFR - Abnormal; Notable for the following components:      Result Value   Sodium 130 (*)    Potassium 3.4 (*)    Chloride 93 (*)    BUN <5 (*)    Total Protein 6.1 (*)    All other components within normal limits  CBC - Abnormal; Notable for the following components:   WBC 10.7 (*)    MCH 35.1 (*)    All other components within normal limits  LIPASE, BLOOD  URINALYSIS, ROUTINE W REFLEX  MICROSCOPIC  HCG, SERUM, QUALITATIVE    EKG: None  Radiology: No results found.   Procedures   Medications Ordered in the ED  methylPREDNISolone sodium succinate (SOLU-MEDROL) 125 mg/2 mL injection 125 mg (125 mg Intramuscular Given 10/08/23 0829)  HYDROmorphone  (DILAUDID ) injection 1 mg (1 mg Intramuscular Given 10/08/23 0830)                                    Medical Decision Making Patient has multiple complaints she had nausea and  vomiting earlier that is resolved.  Patient states she has began experiencing hot flashes and thinks she may be starting menopause.  Patient also complains of pain in her neck.  Patient reports that she has chronic pain in her full back.  Amount and/or Complexity of Data Reviewed Labs: ordered. Decision-making details documented in ED Course.    Details: Labs ordered reviewed and interpreted.  Patient's white blood cell count is 10.7.  Risk Prescription drug management. Risk Details: Patient is given an injection of Solu-Medrol and Dilaudid .  She reports she feels much better.  I discussed patient's symptoms with her I have advised her she needs to follow-up with her primary care physician, see her gynecologist for concerns of menopause.  Patient is advised to return if symptoms worsen or change.        Final diagnoses:  Neck pain    ED Discharge Orders          Ordered    HYDROcodone -acetaminophen  (NORCO/VICODIN) 5-325 MG tablet  Every 4 hours PRN        10/08/23 1016            An After Visit Summary was printed and given to the patient.    Sandi Crosby, [place] 10/08/23 1356    Merdis Stalling, MD 10/09/23 2038

## 2023-10-25 ENCOUNTER — Encounter (HOSPITAL_COMMUNITY): Payer: Self-pay | Admitting: Emergency Medicine

## 2023-10-25 ENCOUNTER — Emergency Department (HOSPITAL_COMMUNITY)
Admission: EM | Admit: 2023-10-25 | Discharge: 2023-10-26 | Discharge status: Against Medical Advice | Payer: MEDICAID | Attending: Emergency Medicine | Admitting: Emergency Medicine

## 2023-10-25 DIAGNOSIS — R109 Unspecified abdominal pain: Secondary | ICD-10-CM | POA: Diagnosis not present

## 2023-10-25 DIAGNOSIS — R519 Headache, unspecified: Secondary | ICD-10-CM | POA: Diagnosis not present

## 2023-10-25 DIAGNOSIS — R112 Nausea with vomiting, unspecified: Secondary | ICD-10-CM | POA: Diagnosis present

## 2023-10-25 DIAGNOSIS — Z5321 Procedure and treatment not carried out due to patient leaving prior to being seen by health care provider: Secondary | ICD-10-CM | POA: Insufficient documentation

## 2023-10-25 DIAGNOSIS — M549 Dorsalgia, unspecified: Secondary | ICD-10-CM | POA: Insufficient documentation

## 2023-10-25 LAB — CBC WITH DIFFERENTIAL/PLATELET
Abs Immature Granulocytes: 0.01 10*3/uL (ref 0.00–0.07)
Basophils Absolute: 0.1 10*3/uL (ref 0.0–0.1)
Basophils Relative: 1 %
Eosinophils Absolute: 0 10*3/uL (ref 0.0–0.5)
Eosinophils Relative: 0 %
HCT: 41.5 % (ref 36.0–46.0)
Hemoglobin: 14.4 g/dL (ref 12.0–15.0)
Immature Granulocytes: 0 %
Lymphocytes Relative: 31 %
Lymphs Abs: 2.7 10*3/uL (ref 0.7–4.0)
MCH: 35.2 pg — ABNORMAL HIGH (ref 26.0–34.0)
MCHC: 34.7 g/dL (ref 30.0–36.0)
MCV: 101.5 fL — ABNORMAL HIGH (ref 80.0–100.0)
Monocytes Absolute: 0.5 10*3/uL (ref 0.1–1.0)
Monocytes Relative: 5 %
Neutro Abs: 5.6 10*3/uL (ref 1.7–7.7)
Neutrophils Relative %: 63 %
Platelets: 389 10*3/uL (ref 150–400)
RBC: 4.09 MIL/uL (ref 3.87–5.11)
RDW: 12.9 % (ref 11.5–15.5)
WBC: 8.9 10*3/uL (ref 4.0–10.5)
nRBC: 0 % (ref 0.0–0.2)

## 2023-10-25 LAB — COMPREHENSIVE METABOLIC PANEL WITH GFR
ALT: 11 U/L (ref 0–44)
AST: 19 U/L (ref 15–41)
Albumin: 3.6 g/dL (ref 3.5–5.0)
Alkaline Phosphatase: 52 U/L (ref 38–126)
Anion gap: 12 (ref 5–15)
BUN: 5 mg/dL — ABNORMAL LOW (ref 6–20)
CO2: 25 mmol/L (ref 22–32)
Calcium: 8.9 mg/dL (ref 8.9–10.3)
Chloride: 97 mmol/L — ABNORMAL LOW (ref 98–111)
Creatinine, Ser: 0.77 mg/dL (ref 0.44–1.00)
GFR, Estimated: >60 mL/min (ref >60)
Glucose, Bld: 91 mg/dL (ref 70–99)
Potassium: 3.1 mmol/L — ABNORMAL LOW (ref 3.5–5.1)
Sodium: 134 mmol/L — ABNORMAL LOW (ref 135–145)
Total Bilirubin: 1.5 mg/dL — ABNORMAL HIGH (ref 0.0–1.2)
Total Protein: 6 g/dL — ABNORMAL LOW (ref 6.5–8.1)

## 2023-10-25 LAB — URINALYSIS, ROUTINE W REFLEX MICROSCOPIC
Bilirubin Urine: NEGATIVE
Glucose, UA: NEGATIVE mg/dL
Hgb urine dipstick: NEGATIVE
Ketones, ur: 20 mg/dL — AB
Leukocytes,Ua: NEGATIVE
Nitrite: NEGATIVE
Protein, ur: NEGATIVE mg/dL
Specific Gravity, Urine: 1.004 — ABNORMAL LOW (ref 1.005–1.030)
pH: 6 (ref 5.0–8.0)

## 2023-10-25 LAB — LIPASE, BLOOD: Lipase: 23 U/L (ref 11–51)

## 2023-10-25 LAB — PREGNANCY, URINE: Preg Test, Ur: NEGATIVE

## 2023-10-25 NOTE — ED Provider Triage Note (Signed)
 Emergency Medicine Provider Triage Evaluation Note  Laura Montoya , a 38 y.o. female  was evaluated in triage.  Pt complains of intractable nausea and vomiting for the last 4 days.  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  Also endorses associated abdominal pain.  Review of Systems  Positive:  Negative: See above   Physical Exam  BP (!) 121/95 (BP Location: Right Arm)   Pulse (!) 129   Temp 99 F (37.2 C)   Resp 18   LMP 01/24/2023 (Approximate)   SpO2 95%  Gen:   Awake, no distress   Resp:  Normal effort  MSK:   Moves extremities without difficulty  Other:  Moderate diffuse abdominal tenderness.  Medical Decision Making  Medically screening exam initiated at 8:23 PM.  Appropriate orders placed.  Laura Montoya was informed that the remainder of the evaluation will be completed by another provider, this initial triage assessment does not replace that evaluation, and the importance of remaining in the ED until their evaluation is complete.     Laura Montoya, [place] 10/25/23 2024

## 2023-10-26 NOTE — ED Notes (Signed)
 Called for vitals no answer

## 2023-10-26 NOTE — ED Notes (Signed)
Called for vitals no answer X2 

## 2023-10-26 NOTE — ED Notes (Signed)
 Called no answer X3

## 2023-10-28 ENCOUNTER — Other Ambulatory Visit: Payer: Self-pay

## 2023-10-28 ENCOUNTER — Emergency Department (HOSPITAL_COMMUNITY)
Admission: EM | Admit: 2023-10-28 | Discharge: 2023-10-28 | Disposition: A | Discharge status: Home / Self Care | Payer: MEDICAID | Attending: Emergency Medicine | Admitting: Emergency Medicine

## 2023-10-28 ENCOUNTER — Encounter (HOSPITAL_COMMUNITY): Payer: Self-pay

## 2023-10-28 DIAGNOSIS — R112 Nausea with vomiting, unspecified: Secondary | ICD-10-CM | POA: Insufficient documentation

## 2023-10-28 DIAGNOSIS — E860 Dehydration: Secondary | ICD-10-CM | POA: Diagnosis not present

## 2023-10-28 DIAGNOSIS — E876 Hypokalemia: Secondary | ICD-10-CM | POA: Diagnosis not present

## 2023-10-28 LAB — CBC
HCT: 38.2 % (ref 36.0–46.0)
Hemoglobin: 13.3 g/dL (ref 12.0–15.0)
MCH: 36 pg — ABNORMAL HIGH (ref 26.0–34.0)
MCHC: 34.8 g/dL (ref 30.0–36.0)
MCV: 103.5 fL — ABNORMAL HIGH (ref 80.0–100.0)
Platelets: 427 K/uL — ABNORMAL HIGH (ref 150–400)
RBC: 3.69 MIL/uL — ABNORMAL LOW (ref 3.87–5.11)
RDW: 12.9 % (ref 11.5–15.5)
WBC: 8.2 K/uL (ref 4.0–10.5)
nRBC: 0 % (ref 0.0–0.2)

## 2023-10-28 LAB — COMPREHENSIVE METABOLIC PANEL WITH GFR
ALT: 11 U/L (ref 0–44)
AST: 16 U/L (ref 15–41)
Albumin: 3.4 g/dL — ABNORMAL LOW (ref 3.5–5.0)
Alkaline Phosphatase: 55 U/L (ref 38–126)
Anion gap: 9 (ref 5–15)
BUN: <5 mg/dL — ABNORMAL LOW (ref 6–20)
CO2: 28 mmol/L (ref 22–32)
Calcium: 9.1 mg/dL (ref 8.9–10.3)
Chloride: 100 mmol/L (ref 98–111)
Creatinine, Ser: 0.7 mg/dL (ref 0.44–1.00)
GFR, Estimated: >60 mL/min (ref >60)
Glucose, Bld: 98 mg/dL (ref 70–99)
Potassium: 3.8 mmol/L (ref 3.5–5.1)
Sodium: 137 mmol/L (ref 135–145)
Total Bilirubin: 0.4 mg/dL (ref 0.0–1.2)
Total Protein: 5.9 g/dL — ABNORMAL LOW (ref 6.5–8.1)

## 2023-10-28 LAB — HCG, SERUM, QUALITATIVE: Preg, Serum: NEGATIVE

## 2023-10-28 LAB — URINALYSIS, ROUTINE W REFLEX MICROSCOPIC
Bilirubin Urine: NEGATIVE
Glucose, UA: NEGATIVE mg/dL
Hgb urine dipstick: NEGATIVE
Ketones, ur: NEGATIVE mg/dL
Leukocytes,Ua: NEGATIVE
Nitrite: NEGATIVE
Protein, ur: NEGATIVE mg/dL
Specific Gravity, Urine: 1.001 — ABNORMAL LOW (ref 1.005–1.030)
pH: 6 (ref 5.0–8.0)

## 2023-10-28 LAB — LIPASE, BLOOD: Lipase: 28 U/L (ref 11–51)

## 2023-10-28 LAB — MAGNESIUM: Magnesium: 1.9 mg/dL (ref 1.7–2.4)

## 2023-10-28 MED ORDER — SODIUM CHLORIDE 0.9 % IV BOLUS
1000.0000 mL | Freq: Once | INTRAVENOUS | Status: AC
  Administered 2023-10-28: 1000 mL via INTRAVENOUS

## 2023-10-28 MED ORDER — DROPERIDOL 2.5 MG/ML IJ SOLN
1.2500 mg | Freq: Once | INTRAMUSCULAR | Status: AC
  Administered 2023-10-28: 1.25 mg via INTRAVENOUS
  Filled 2023-10-28: qty 2

## 2023-10-28 MED ORDER — POTASSIUM CHLORIDE 10 MEQ/100ML IV SOLN
10.0000 meq | Freq: Once | INTRAVENOUS | Status: AC
  Administered 2023-10-28: 10 meq via INTRAVENOUS
  Filled 2023-10-28: qty 100

## 2023-10-28 MED ORDER — METOCLOPRAMIDE HCL 10 MG PO TABS
10.0000 mg | ORAL_TABLET | Freq: Four times a day (QID) | ORAL | 0 refills | Status: DC | PRN
Start: 2023-10-28 — End: 2024-03-03

## 2023-10-28 NOTE — ED Provider Notes (Signed)
 [place] EMERGENCY DEPARTMENT AT [facility] Provider Note   CSN: [id number] Arrival date & time: 10/28/23  1150     Patient presents with: Abdominal Pain and Back Pain   Laura Montoya is a 38 y.o. female.   Pt is a 38 yo female with pmhx significant for GERD, bipolar d/o, schizophrenia, anxiety, and chronic abd pain and n/v.  The pt has had some upper abd pain and n/v for the past few days.  She came to the ED on 7/3, had labs, but never        Prior to Admission medications   Medication Sig Start Date End Date Taking? Authorizing Provider  metoCLOPramide  (REGLAN ) 10 MG tablet Take 1 tablet (10 mg total) by mouth every 6 (six) hours as needed for nausea or vomiting. 10/28/23  Yes Kasai Beltran, MD  albuterol (PROVENTIL HFA;VENTOLIN HFA) 108 (90 Base) MCG/ACT inhaler Inhale 1-2 puffs into the lungs every 6 (six) hours as needed for wheezing or shortness of breath.    [provider]  Azelastine HCl 137 MCG/SPRAY SOLN Place 2 sprays into both nostrils 2 (two) times daily as needed for allergies. 04/17/21   [provider]  buPROPion  (WELLBUTRIN  XL) 150 MG 24 hr tablet Take 150 mg by mouth daily. 02/14/23   [provider]  Cyanocobalamin  (B-12) 2500 MCG TABS Take 1 tablet by mouth daily.    [provider]  dicyclomine  (BENTYL ) 10 MG capsule Take 10 mg by mouth as needed. 05/23/23   [provider]  DULoxetine  (CYMBALTA ) 30 MG capsule Take 1 capsule (30 mg total) by mouth 2 (two) times daily. 07/04/23   Patel, Donika K, DO  fluticasone (FLONASE) 50 MCG/ACT nasal spray Place 1 spray into both nostrils as needed. 05/30/22   [provider]  folic acid  (FOLVITE ) 1 MG tablet Take 1 mg by mouth daily. 06/27/21   [provider]  hydrochlorothiazide (HYDRODIURIL) 12.5 MG tablet Take 12.5 mg by mouth as needed. 06/01/23   [provider]  HYDROcodone -acetaminophen  (NORCO/VICODIN) 5-325 MG tablet Take 1 tablet by  mouth every 4 (four) hours as needed. 10/08/23 10/07/24  Sofia, Leslie K, PA-C  hydroxychloroquine (PLAQUENIL) 200 MG tablet Take 200 mg by mouth at bedtime.    [provider]  hydrOXYzine (ATARAX) 50 MG tablet Take 50 mg by mouth as needed. 02/19/19   [provider]  lidocaine  (LIDODERM ) 5 % Place 1 patch onto the skin daily. Remove & Discard patch within 12 hours or as directed by MD 11/22/22   Prosperi, Sherlean H, PA-C  loratadine (CLARITIN) 10 MG tablet Take 10 mg by mouth as needed. 12/27/22   [provider]  methotrexate (RHEUMATREX) 2.5 MG tablet Take 12.5 mg by mouth every Sunday. (5 tablets) 07/19/21   [provider]  metoprolol succinate (TOPROL-XL) 50 MG 24 hr tablet Take 50 mg by mouth daily. 04/20/22   [provider]  ondansetron  (ZOFRAN -ODT) 8 MG disintegrating tablet Take 1 tablet (8 mg total) by mouth every 8 (eight) hours as needed for nausea or vomiting. 11/26/22   Raford Lenis, MD  pantoprazole  (PROTONIX ) 40 MG tablet Take 1 tablet (40 mg total) by mouth daily. 07/24/23   McMichael, Nestor HERO, PA-C  potassium chloride  20 MEQ TBCR Take 1 tablet (20 mEq total) by mouth daily. 10/30/22   Midge Golas, MD  promethazine  (PHENERGAN ) 25 MG suppository Place 1 suppository (25 mg total) rectally every 6 (six) hours as needed for nausea or vomiting.  01/17/23   Desiderio Chew, PA-C  thiamine  (VITAMIN B1) 100 MG tablet Take 100 mg by mouth as needed. 02/19/19   [provider]  tiZANidine  (ZANAFLEX ) 4 MG tablet Take 2-4 mg by mouth 3 (three) times daily as needed.    [provider]  traZODone (DESYREL) 50 MG tablet Take 50 mg by mouth at bedtime as needed. 07/06/23   [provider]    Allergies: Nsaids and Carbamazepine    Review of Systems  Updated Vital Signs BP 126/83 (BP Location: Right Arm)   Pulse 80   Temp 98.1 F (36.7 C) (Oral)   Resp 17   LMP 01/24/2023 (Approximate)   SpO2 100%   Physical  Exam  (all labs ordered are listed, but only abnormal results are displayed) Labs Reviewed  COMPREHENSIVE METABOLIC PANEL WITH GFR - Abnormal; Notable for the following components:      Result Value   BUN <5 (*)    Total Protein 5.9 (*)    Albumin 3.4 (*)    All other components within normal limits  CBC - Abnormal; Notable for the following components:   RBC 3.69 (*)    MCV 103.5 (*)    MCH 36.0 (*)    Platelets 427 (*)    All other components within normal limits  URINALYSIS, ROUTINE W REFLEX MICROSCOPIC - Abnormal; Notable for the following components:   Color, Urine STRAW (*)    Specific Gravity, Urine 1.001 (*)    All other components within normal limits  LIPASE, BLOOD  HCG, SERUM, QUALITATIVE  MAGNESIUM     EKG: EKG Interpretation Date/Time:  Saturday October 28 2023 13:33:31 EDT Ventricular Rate:  62 PR Interval:  106 QRS Duration:  109 QT Interval:  439 QTC Calculation: 446 R Axis:   82  Text Interpretation: Sinus rhythm Short PR interval RSR' in V1 or V2, right VCD or RVH No significant change since last tracing Confirmed by Dean Clarity [phone]) on 10/28/2023 3:05:36 PM  Radiology: No results found.   Procedures   Medications Ordered in the ED  sodium chloride  0.9 % bolus 1,000 mL (0 mLs Intravenous Stopped 10/28/23 1432)  droperidol  (INAPSINE ) 2.5 MG/ML injection 1.25 mg (1.25 mg Intravenous Given 10/28/23 1328)  potassium chloride  10 mEq in 100 mL IVPB (0 mEq Intravenous Stopped 10/28/23 1432)                                    Medical Decision Making Amount and/or Complexity of Data Reviewed Labs: ordered.  Risk Prescription drug management.   This patient presents to the ED for concern of n/v, this involves an extensive number of treatment options, and is a complaint that carries with it a high risk of complications and morbidity.  The differential diagnosis includes gastritis, gerd, cyclic vomiting   Co morbidities that complicate the patient  evaluation  GERD, bipolar d/o, schizophrenia, anxiety, and chronic abd pain and n/v   Additional history obtained:  Additional history obtained from epic chart review  Lab Tests:  I Ordered, and personally interpreted labs.  The pertinent results include:  cbc nl, cmp nl, lip nl, ua nl  Medicines ordered and prescription drug management:  I ordered medication including ivfs/inapsine   for sx  Reevaluation of the patient after these medicines showed that the patient improved I have reviewed the patients home medicines and have made adjustments as needed   Test Considered:  Ct/us   Problem List / ED Course:  N/v and dehydration:  pt is feeling much better.  She said she's had several scans which were neg and does not want another one.  She is tolerating po fluids now. Hypokalemia on 7/2.  Pt given 1 dose of Iv kcl.  Resolved now.   Reevaluation:  After the interventions noted above, I reevaluated the patient and found that they have :improved   Social Determinants of Health:  Lives at home   Dispostion:  After consideration of the diagnostic results and the patients response to treatment, I feel that the patent would benefit from discharge with outpatient f/u.       Final diagnoses:  Dehydration  Nausea and vomiting, unspecified vomiting type    ED Discharge Orders          Ordered    metoCLOPramide  (REGLAN ) 10 MG tablet  Every 6 hours PRN        10/28/23 1454               Dean Clarity, MD 10/28/23 1525

## 2023-10-28 NOTE — ED Notes (Signed)
 Pt tolerating po challenge.

## 2023-10-28 NOTE — ED Triage Notes (Signed)
 Pt c.o abd pain and back pain x 1 week. Pt states  I think I'm dehydrated

## 2023-10-28 NOTE — ED Notes (Signed)
 Lab called regarding mag level that was added on one hour ago. Per lab, there is only one person in there and they are doing add ons now. MD notified.  Pt given cup of ice and water for po challenge

## 2023-11-13 ENCOUNTER — Emergency Department (HOSPITAL_COMMUNITY)
Admission: EM | Admit: 2023-11-13 | Discharge: 2023-11-13 | Disposition: A | Discharge status: Home / Self Care | Payer: MEDICAID | Attending: Emergency Medicine | Admitting: Emergency Medicine

## 2023-11-13 ENCOUNTER — Encounter (HOSPITAL_COMMUNITY): Payer: Self-pay

## 2023-11-13 ENCOUNTER — Other Ambulatory Visit: Payer: Self-pay

## 2023-11-13 DIAGNOSIS — M542 Cervicalgia: Secondary | ICD-10-CM | POA: Insufficient documentation

## 2023-11-13 DIAGNOSIS — R519 Headache, unspecified: Secondary | ICD-10-CM | POA: Insufficient documentation

## 2023-11-13 LAB — CBC WITH DIFFERENTIAL/PLATELET
Abs Immature Granulocytes: 0.01 K/uL (ref 0.00–0.07)
Basophils Absolute: 0.1 K/uL (ref 0.0–0.1)
Basophils Relative: 1 %
Eosinophils Absolute: 0.2 K/uL (ref 0.0–0.5)
Eosinophils Relative: 2 %
HCT: 39.7 % (ref 36.0–46.0)
Hemoglobin: 13.5 g/dL (ref 12.0–15.0)
Immature Granulocytes: 0 %
Lymphocytes Relative: 50 %
Lymphs Abs: 3.9 K/uL (ref 0.7–4.0)
MCH: 35.2 pg — ABNORMAL HIGH (ref 26.0–34.0)
MCHC: 34 g/dL (ref 30.0–36.0)
MCV: 103.4 fL — ABNORMAL HIGH (ref 80.0–100.0)
Monocytes Absolute: 0.7 K/uL (ref 0.1–1.0)
Monocytes Relative: 9 %
Neutro Abs: 3 K/uL (ref 1.7–7.7)
Neutrophils Relative %: 38 %
Platelets: 359 K/uL (ref 150–400)
RBC: 3.84 MIL/uL — ABNORMAL LOW (ref 3.87–5.11)
RDW: 13.2 % (ref 11.5–15.5)
WBC: 7.7 K/uL (ref 4.0–10.5)
nRBC: 0 % (ref 0.0–0.2)

## 2023-11-13 LAB — COMPREHENSIVE METABOLIC PANEL WITH GFR
ALT: 14 U/L (ref 0–44)
AST: 19 U/L (ref 15–41)
Albumin: 3.8 g/dL (ref 3.5–5.0)
Alkaline Phosphatase: 65 U/L (ref 38–126)
Anion gap: 11 (ref 5–15)
BUN: <5 mg/dL — ABNORMAL LOW (ref 6–20)
CO2: 25 mmol/L (ref 22–32)
Calcium: 9.2 mg/dL (ref 8.9–10.3)
Chloride: 100 mmol/L (ref 98–111)
Creatinine, Ser: 0.7 mg/dL (ref 0.44–1.00)
GFR, Estimated: >60 mL/min (ref >60)
Glucose, Bld: 85 mg/dL (ref 70–99)
Potassium: 4.3 mmol/L (ref 3.5–5.1)
Sodium: 136 mmol/L (ref 135–145)
Total Bilirubin: 0.6 mg/dL (ref 0.0–1.2)
Total Protein: 6.4 g/dL — ABNORMAL LOW (ref 6.5–8.1)

## 2023-11-13 LAB — HCG, SERUM, QUALITATIVE: Preg, Serum: NEGATIVE

## 2023-11-13 LAB — I-STAT CG4 LACTIC ACID, ED: Lactic Acid, Venous: 1 mmol/L (ref 0.5–1.9)

## 2023-11-13 MED ORDER — HYDROMORPHONE HCL 1 MG/ML IJ SOLN: 1.0000 mg | Freq: Once | INTRAMUSCULAR | Status: DC

## 2023-11-13 MED ORDER — METHYLPREDNISOLONE SODIUM SUCC 125 MG IJ SOLR
125.0000 mg | Freq: Once | INTRAMUSCULAR | Status: AC
  Administered 2023-11-13: 125 mg via INTRAVENOUS
  Filled 2023-11-13: qty 2

## 2023-11-13 MED ORDER — HYDROMORPHONE HCL 1 MG/ML IJ SOLN
1.0000 mg | Freq: Once | INTRAMUSCULAR | Status: AC
  Administered 2023-11-13: 1 mg via INTRAVENOUS
  Filled 2023-11-13: qty 1

## 2023-11-13 MED ORDER — METHYLPREDNISOLONE SODIUM SUCC 125 MG IJ SOLR
125.0000 mg | Freq: Once | INTRAMUSCULAR | Status: DC
Start: 2023-11-13 — End: 2023-11-13

## 2023-11-13 NOTE — ED Triage Notes (Signed)
 C/o neck pain and backache with headache x 1 year  states she is much worse last week.

## 2023-11-13 NOTE — ED Provider Notes (Signed)
 [place] EMERGENCY DEPARTMENT AT [facility] Provider Note   CSN: [id number] Arrival date & time: 11/13/23  9295     Patient presents with: Torticollis, Back Pain, and Headache   Laura Montoya is a 38 y.o. female.   38 year old female presents today for concern of neck pain.  She states this is a chronic issue for her and sees pain management clinic.  States that she thought she had an injection scheduled for today but she reached out to the clinic and states that it is not scheduled until sometime in August.  States she also has a headache associated with it.  Headache started last week.  Was not a thunderclap headache and has been gradually worsening.  Endorses photophobia.  No vision change.  Also reports back pain which is chronic and unchanged.  The history is provided by the patient. No language interpreter was used.       Prior to Admission medications   Medication Sig Start Date End Date Taking? Authorizing Provider  albuterol (PROVENTIL HFA;VENTOLIN HFA) 108 (90 Base) MCG/ACT inhaler Inhale 1-2 puffs into the lungs every 6 (six) hours as needed for wheezing or shortness of breath.    [provider]  Azelastine HCl 137 MCG/SPRAY SOLN Place 2 sprays into both nostrils 2 (two) times daily as needed for allergies. 04/17/21   [provider]  buPROPion  (WELLBUTRIN  XL) 150 MG 24 hr tablet Take 150 mg by mouth daily. 02/14/23   [provider]  Cyanocobalamin  (B-12) 2500 MCG TABS Take 1 tablet by mouth daily.    [provider]  dicyclomine  (BENTYL ) 10 MG capsule Take 10 mg by mouth as needed. 05/23/23   [provider]  DULoxetine  (CYMBALTA ) 30 MG capsule Take 1 capsule (30 mg total) by mouth 2 (two) times daily. 07/04/23   Patel, Donika K, DO  fluticasone (FLONASE) 50 MCG/ACT nasal spray Place 1 spray into both nostrils as needed. 05/30/22   [provider]  folic acid  (FOLVITE ) 1 MG tablet Take 1 mg by mouth daily.  06/27/21   [provider]  hydrochlorothiazide (HYDRODIURIL) 12.5 MG tablet Take 12.5 mg by mouth as needed. 06/01/23   [provider]  HYDROcodone -acetaminophen  (NORCO/VICODIN) 5-325 MG tablet Take 1 tablet by mouth every 4 (four) hours as needed. 10/08/23 10/07/24  Sofia, Leslie K, PA-C  hydroxychloroquine (PLAQUENIL) 200 MG tablet Take 200 mg by mouth at bedtime.    [provider]  hydrOXYzine (ATARAX) 50 MG tablet Take 50 mg by mouth as needed. 02/19/19   [provider]  lidocaine  (LIDODERM ) 5 % Place 1 patch onto the skin daily. Remove & Discard patch within 12 hours or as directed by MD 11/22/22   Prosperi, Sherlean H, PA-C  loratadine (CLARITIN) 10 MG tablet Take 10 mg by mouth as needed. 12/27/22   [provider]  methotrexate (RHEUMATREX) 2.5 MG tablet Take 12.5 mg by mouth every Sunday. (5 tablets) 07/19/21   [provider]  metoCLOPramide  (REGLAN ) 10 MG tablet Take 1 tablet (10 mg total) by mouth every 6 (six) hours as needed for nausea or vomiting. 10/28/23   Haviland, Julie, MD  metoprolol succinate (TOPROL-XL) 50 MG 24 hr tablet Take 50 mg by mouth daily. 04/20/22   [provider]  ondansetron  (ZOFRAN -ODT) 8 MG disintegrating tablet Take 1 tablet (8 mg total) by mouth every 8 (eight) hours as needed for nausea or vomiting. 11/26/22   Raford Lenis, MD  pantoprazole  (PROTONIX ) 40 MG tablet  Take 1 tablet (40 mg total) by mouth daily. 07/24/23   McMichael, Nestor HERO, PA-C  potassium chloride  20 MEQ TBCR Take 1 tablet (20 mEq total) by mouth daily. 10/30/22   Midge Golas, MD  promethazine  (PHENERGAN ) 25 MG suppository Place 1 suppository (25 mg total) rectally every 6 (six) hours as needed for nausea or vomiting. 01/17/23   Desiderio Chew, PA-C  thiamine  (VITAMIN B1) 100 MG tablet Take 100 mg by mouth as needed. 02/19/19   [provider]  tiZANidine  (ZANAFLEX ) 4 MG tablet Take 2-4 mg by mouth 3 (three) times daily as needed.     [provider]  traZODone (DESYREL) 50 MG tablet Take 50 mg by mouth at bedtime as needed. 07/06/23   [provider]    Allergies: Nsaids and Carbamazepine    Review of Systems  Constitutional:  Negative for fever.  Eyes:  Positive for photophobia.  Musculoskeletal:  Positive for back pain (Chronic) and neck pain (Chronic).  Neurological:  Positive for headaches. Negative for weakness and numbness.  All other systems reviewed and are negative.   Updated Vital Signs BP 110/74 (BP Location: Right Arm)   Pulse 70   Temp 98.2 F (36.8 C)   Resp 16   Ht 5' 3 (1.6 m)   Wt 49.9 kg   LMP 01/24/2023 (Approximate)   SpO2 99%   BMI 19.49 kg/m   Physical Exam Vitals and nursing note reviewed.  Constitutional:      General: She is not in acute distress.    Appearance: Normal appearance. She is not ill-appearing.     Comments: Well-appearing female who appears older than stated age.  HENT:     Head: Normocephalic and atraumatic.     Nose: Nose normal.  Eyes:     Extraocular Movements: Extraocular movements intact.     Conjunctiva/sclera: Conjunctivae normal.     Pupils: Pupils are equal, round, and reactive to light.  Pulmonary:     Effort: Pulmonary effort is normal. No respiratory distress.  Musculoskeletal:        General: No deformity.  Skin:    Findings: No rash.  Neurological:     General: No focal deficit present.     Mental Status: She is alert and oriented to person, place, and time.     Cranial Nerves: No cranial nerve deficit.     Sensory: No sensory deficit.     Motor: No weakness.     Comments: Cranial nerves III through XII intact.  Good range of motion of bilateral upper and lower extremities with 5/5 strength in extensor and flexor muscles.  Tongue midline.  No pronator drift.  Normal speech.  Negative meningeal signs.     (all labs ordered are listed, but only abnormal results are displayed) Labs Reviewed  COMPREHENSIVE METABOLIC PANEL  WITH GFR - Abnormal; Notable for the following components:      Result Value   BUN <5 (*)    Total Protein 6.4 (*)    All other components within normal limits  CBC WITH DIFFERENTIAL/PLATELET - Abnormal; Notable for the following components:   RBC 3.84 (*)    MCV 103.4 (*)    MCH 35.2 (*)    All other components within normal limits  HCG, SERUM, QUALITATIVE  I-STAT CG4 LACTIC ACID, ED    EKG: None  Radiology: No results found.   Procedures   Medications Ordered in the ED  HYDROmorphone  (DILAUDID ) injection 1 mg (1 mg Intravenous Given 11/13/23  9164)  methylPREDNISolone  sodium succinate (SOLU-MEDROL ) 125 mg/2 mL injection 125 mg (125 mg Intravenous Given 11/13/23 0835)                                    Medical Decision Making Amount and/or Complexity of Data Reviewed Labs: ordered.  Risk Prescription drug management.   38 year old female presents with above-mentioned complaints.  Overall she is well-appearing.  Reports of pain acute on chronic neck pain with headache.  No neck stiffness.  Negative meningeal signs.  No suspicion for meningitis.  Blood work obtained through triage.  This is normal without elevation in white count or anemia on CBC.  CMP without acute concern.  Lactic acid normal. Symptomatic management given with 1 mg Dilaudid  and Solu-Medrol . Symptoms significantly improved. Discussed follow-up with pain clinic. Patient voices understanding and is in agreement with plan. On reevaluation patient is playing Rojean with her friend that is at bedside.  Final diagnoses:  Neck pain  Nonintractable headache, unspecified chronicity pattern, unspecified headache type    ED Discharge Orders     None          Hildegard Loge, PA-C 11/13/23 9093    Cottie Donnice PARAS, MD 11/14/23 [phone]

## 2023-11-13 NOTE — Discharge Instructions (Signed)
 Follow-up with your management clinic.  Return for any concerning symptoms.  Blood work was reassuring.

## 2023-11-24 ENCOUNTER — Encounter: Payer: Self-pay | Admitting: Neurology

## 2024-01-17 ENCOUNTER — Other Ambulatory Visit: Payer: Self-pay | Admitting: Neurology

## 2024-01-18 ENCOUNTER — Telehealth: Payer: Self-pay | Admitting: Neurology

## 2024-01-18 NOTE — Telephone Encounter (Signed)
 Pt states that we would not refill her medication  she last seen Dr Tobie in March of 2025 and has a follow up appt with her in march of 2026  The name of the medication is Duloxetine  please call patient

## 2024-01-18 NOTE — Telephone Encounter (Signed)
 Called patient and informed her that in march 2025 Dr. Tobie sent in 90 day supply with 3 refills of her Duloxetine  and she should still have refills on file. Advised patient to please contact her pharmacy. Patient verbalized understanding and had no further questions or concerns.

## 2024-03-01 ENCOUNTER — Other Ambulatory Visit: Payer: Self-pay

## 2024-03-01 ENCOUNTER — Observation Stay (HOSPITAL_COMMUNITY)
Admission: EM | Admit: 2024-03-01 | Discharge: 2024-03-03 | Disposition: A | Discharge status: Home / Self Care | Payer: MEDICAID | Attending: Internal Medicine | Admitting: Internal Medicine

## 2024-03-01 ENCOUNTER — Emergency Department (HOSPITAL_COMMUNITY): Payer: MEDICAID

## 2024-03-01 ENCOUNTER — Encounter (HOSPITAL_COMMUNITY): Payer: Self-pay | Admitting: *Deleted

## 2024-03-01 DIAGNOSIS — K209 Esophagitis, unspecified without bleeding: Principal | ICD-10-CM

## 2024-03-01 DIAGNOSIS — R109 Unspecified abdominal pain: Secondary | ICD-10-CM | POA: Diagnosis present

## 2024-03-01 DIAGNOSIS — K920 Hematemesis: Secondary | ICD-10-CM | POA: Diagnosis not present

## 2024-03-01 DIAGNOSIS — E873 Alkalosis: Secondary | ICD-10-CM

## 2024-03-01 DIAGNOSIS — F17210 Nicotine dependence, cigarettes, uncomplicated: Secondary | ICD-10-CM | POA: Diagnosis not present

## 2024-03-01 DIAGNOSIS — Z789 Other specified health status: Secondary | ICD-10-CM

## 2024-03-01 DIAGNOSIS — R9431 Abnormal electrocardiogram [ECG] [EKG]: Secondary | ICD-10-CM | POA: Diagnosis not present

## 2024-03-01 DIAGNOSIS — J4489 Other specified chronic obstructive pulmonary disease: Secondary | ICD-10-CM | POA: Diagnosis not present

## 2024-03-01 DIAGNOSIS — N9489 Other specified conditions associated with female genital organs and menstrual cycle: Secondary | ICD-10-CM | POA: Insufficient documentation

## 2024-03-01 DIAGNOSIS — R112 Nausea with vomiting, unspecified: Secondary | ICD-10-CM

## 2024-03-01 LAB — URINALYSIS, ROUTINE W REFLEX MICROSCOPIC
Bacteria, UA: NONE SEEN
Bilirubin Urine: NEGATIVE
Glucose, UA: NEGATIVE mg/dL
Hgb urine dipstick: NEGATIVE
Ketones, ur: 80 mg/dL — AB
Leukocytes,Ua: NEGATIVE
Nitrite: NEGATIVE
Protein, ur: NEGATIVE mg/dL
Specific Gravity, Urine: >1.046 — ABNORMAL HIGH (ref 1.005–1.030)
pH: 6 (ref 5.0–8.0)

## 2024-03-01 LAB — CBC
HCT: 39.1 % (ref 36.0–46.0)
HCT: 32.8 % — ABNORMAL LOW (ref 36.0–46.0)
Hemoglobin: 14.1 g/dL (ref 12.0–15.0)
Hemoglobin: 11.9 g/dL — ABNORMAL LOW (ref 12.0–15.0)
MCH: 34.1 pg — ABNORMAL HIGH (ref 26.0–34.0)
MCH: 34.2 pg — ABNORMAL HIGH (ref 26.0–34.0)
MCHC: 36.1 g/dL — ABNORMAL HIGH (ref 30.0–36.0)
MCHC: 36.3 g/dL — ABNORMAL HIGH (ref 30.0–36.0)
MCV: 94.4 fL (ref 80.0–100.0)
MCV: 94.3 fL (ref 80.0–100.0)
Platelets: 417 K/uL — ABNORMAL HIGH (ref 150–400)
Platelets: 293 K/uL (ref 150–400)
RBC: 4.14 MIL/uL (ref 3.87–5.11)
RBC: 3.48 MIL/uL — ABNORMAL LOW (ref 3.87–5.11)
RDW: 11.9 % (ref 11.5–15.5)
RDW: 11.8 % (ref 11.5–15.5)
WBC: 20.7 K/uL — ABNORMAL HIGH (ref 4.0–10.5)
WBC: 16.1 K/uL — ABNORMAL HIGH (ref 4.0–10.5)
nRBC: 0 % (ref 0.0–0.2)
nRBC: 0 % (ref 0.0–0.2)

## 2024-03-01 LAB — CBC WITH DIFFERENTIAL/PLATELET
Abs Immature Granulocytes: 0.06 K/uL (ref 0.00–0.07)
Basophils Absolute: 0 K/uL (ref 0.0–0.1)
Basophils Relative: 0 %
Eosinophils Absolute: 0 K/uL (ref 0.0–0.5)
Eosinophils Relative: 0 %
HCT: 34.8 % — ABNORMAL LOW (ref 36.0–46.0)
Hemoglobin: 12.5 g/dL (ref 12.0–15.0)
Immature Granulocytes: 0 %
Lymphocytes Relative: 9 %
Lymphs Abs: 1.4 K/uL (ref 0.7–4.0)
MCH: 34.3 pg — ABNORMAL HIGH (ref 26.0–34.0)
MCHC: 35.9 g/dL (ref 30.0–36.0)
MCV: 95.6 fL (ref 80.0–100.0)
Monocytes Absolute: 0.5 K/uL (ref 0.1–1.0)
Monocytes Relative: 3 %
Neutro Abs: 14 K/uL — ABNORMAL HIGH (ref 1.7–7.7)
Neutrophils Relative %: 88 %
Platelets: 322 K/uL (ref 150–400)
RBC: 3.64 MIL/uL — ABNORMAL LOW (ref 3.87–5.11)
RDW: 11.9 % (ref 11.5–15.5)
WBC: 16 K/uL — ABNORMAL HIGH (ref 4.0–10.5)
nRBC: 0 % (ref 0.0–0.2)

## 2024-03-01 LAB — COMPREHENSIVE METABOLIC PANEL WITH GFR
ALT: 18 U/L (ref 0–44)
AST: 33 U/L (ref 15–41)
Albumin: 4.1 g/dL (ref 3.5–5.0)
Alkaline Phosphatase: 72 U/L (ref 38–126)
Anion gap: 22 — ABNORMAL HIGH (ref 5–15)
BUN: 15 mg/dL (ref 6–20)
CO2: 20 mmol/L — ABNORMAL LOW (ref 22–32)
Calcium: 9.7 mg/dL (ref 8.9–10.3)
Chloride: 94 mmol/L — ABNORMAL LOW (ref 98–111)
Creatinine, Ser: 0.98 mg/dL (ref 0.44–1.00)
GFR, Estimated: >60 mL/min (ref >60)
Glucose, Bld: 164 mg/dL — ABNORMAL HIGH (ref 70–99)
Potassium: 3.8 mmol/L (ref 3.5–5.1)
Sodium: 136 mmol/L (ref 135–145)
Total Bilirubin: 1.4 mg/dL — ABNORMAL HIGH (ref 0.0–1.2)
Total Protein: 6.9 g/dL (ref 6.5–8.1)

## 2024-03-01 LAB — TYPE AND SCREEN
ABO/RH(D): O POS
Antibody Screen: NEGATIVE

## 2024-03-01 LAB — RESP PANEL BY RT-PCR (RSV, FLU A&B, COVID)  RVPGX2
Influenza A by PCR: NEGATIVE
Influenza B by PCR: NEGATIVE
Resp Syncytial Virus by PCR: NEGATIVE
SARS Coronavirus 2 by RT PCR: NEGATIVE

## 2024-03-01 LAB — ETHANOL: Alcohol, Ethyl (B): <15 mg/dL (ref <15)

## 2024-03-01 LAB — I-STAT CG4 LACTIC ACID, ED
Lactic Acid, Venous: 6.1 mmol/L (ref 0.5–1.9)
Lactic Acid, Venous: 1.7 mmol/L (ref 0.5–1.9)

## 2024-03-01 LAB — RAPID URINE DRUG SCREEN, HOSP PERFORMED
Amphetamines: NOT DETECTED
Barbiturates: NOT DETECTED
Benzodiazepines: NOT DETECTED
Cocaine: NOT DETECTED
Opiates: NOT DETECTED
Tetrahydrocannabinol: POSITIVE — AB

## 2024-03-01 LAB — SALICYLATE LEVEL: Salicylate Lvl: <7 mg/dL — ABNORMAL LOW (ref 7.0–30.0)

## 2024-03-01 LAB — I-STAT VENOUS BLOOD GAS, ED
Acid-Base Excess: 3 mmol/L — ABNORMAL HIGH (ref 0.0–2.0)
Bicarbonate: 22.8 mmol/L (ref 20.0–28.0)
Calcium, Ion: 1 mmol/L — ABNORMAL LOW (ref 1.15–1.40)
HCT: 41 % (ref 36.0–46.0)
Hemoglobin: 13.9 g/dL (ref 12.0–15.0)
O2 Saturation: 100 %
Potassium: 3.9 mmol/L (ref 3.5–5.1)
Sodium: 132 mmol/L — ABNORMAL LOW (ref 135–145)
TCO2: 23 mmol/L (ref 22–32)
pCO2, Ven: 22.5 mmHg — ABNORMAL LOW (ref 44–60)
pH, Ven: 7.614 (ref 7.25–7.43)
pO2, Ven: 150 mmHg — ABNORMAL HIGH (ref 32–45)

## 2024-03-01 LAB — ACETAMINOPHEN LEVEL: Acetaminophen (Tylenol), Serum: <10 ug/mL — ABNORMAL LOW (ref 10–30)

## 2024-03-01 LAB — HIV ANTIBODY (ROUTINE TESTING W REFLEX): HIV Screen 4th Generation wRfx: NONREACTIVE

## 2024-03-01 LAB — POC OCCULT BLOOD, ED: Fecal Occult Bld: POSITIVE — AB

## 2024-03-01 LAB — HCG, SERUM, QUALITATIVE: Preg, Serum: NEGATIVE

## 2024-03-01 LAB — BILIRUBIN, DIRECT: Bilirubin, Direct: <0.1 mg/dL (ref 0.0–0.2)

## 2024-03-01 LAB — LIPASE, BLOOD: Lipase: 19 U/L (ref 11–51)

## 2024-03-01 LAB — ABO/RH: ABO/RH(D): O POS

## 2024-03-01 MED ORDER — ALBUTEROL SULFATE (2.5 MG/3ML) 0.083% IN NEBU: 2.5000 mg | INHALATION_SOLUTION | Freq: Four times a day (QID) | RESPIRATORY_TRACT | Status: DC | PRN

## 2024-03-01 MED ORDER — ACETAMINOPHEN 500 MG PO TABS
1000.0000 mg | ORAL_TABLET | Freq: Four times a day (QID) | ORAL | Status: DC | PRN
  Administered 2024-03-03: 1000 mg via ORAL
  Filled 2024-03-01: qty 2

## 2024-03-01 MED ORDER — PANTOPRAZOLE SODIUM 40 MG IV SOLR
40.0000 mg | Freq: Two times a day (BID) | INTRAVENOUS | Status: DC
  Administered 2024-03-02 – 2024-03-03 (×3): 40 mg via INTRAVENOUS
  Filled 2024-03-01 (×3): qty 10

## 2024-03-01 MED ORDER — ACETAMINOPHEN 650 MG RE SUPP: 650.0000 mg | Freq: Four times a day (QID) | RECTAL | Status: DC | PRN

## 2024-03-01 MED ORDER — NICOTINE 21 MG/24HR TD PT24
21.0000 mg | MEDICATED_PATCH | Freq: Every day | TRANSDERMAL | Status: DC
  Filled 2024-03-01 (×2): qty 1

## 2024-03-01 MED ORDER — DULOXETINE HCL 30 MG PO CPEP
30.0000 mg | ORAL_CAPSULE | Freq: Two times a day (BID) | ORAL | Status: DC
  Administered 2024-03-02 – 2024-03-03 (×3): 30 mg via ORAL
  Filled 2024-03-01 (×3): qty 1

## 2024-03-01 MED ORDER — ENOXAPARIN SODIUM 30 MG/0.3ML IJ SOSY
30.0000 mg | PREFILLED_SYRINGE | INTRAMUSCULAR | Status: DC
  Administered 2024-03-01: 30 mg via SUBCUTANEOUS
  Filled 2024-03-01: qty 0.3

## 2024-03-01 MED ORDER — KCL IN DEXTROSE-NACL 20-5-0.45 MEQ/L-%-% IV SOLN
INTRAVENOUS | Status: DC
  Filled 2024-03-01: qty 1000

## 2024-03-01 MED ORDER — FOLIC ACID 1 MG PO TABS
1.0000 mg | ORAL_TABLET | Freq: Every day | ORAL | Status: DC
  Administered 2024-03-02 – 2024-03-03 (×2): 1 mg via ORAL
  Filled 2024-03-01 (×2): qty 1

## 2024-03-01 MED ORDER — ENOXAPARIN SODIUM 40 MG/0.4ML IJ SOSY
40.0000 mg | PREFILLED_SYRINGE | INTRAMUSCULAR | Status: DC
  Administered 2024-03-02: 40 mg via SUBCUTANEOUS
  Filled 2024-03-01: qty 0.4

## 2024-03-01 MED ORDER — LACTATED RINGERS IV SOLN: INTRAVENOUS | Status: AC

## 2024-03-01 MED ORDER — FENTANYL CITRATE (PF) 50 MCG/ML IJ SOSY
50.0000 ug | PREFILLED_SYRINGE | Freq: Once | INTRAMUSCULAR | Status: AC
  Administered 2024-03-01: 50 ug via INTRAVENOUS
  Filled 2024-03-01: qty 1

## 2024-03-01 MED ORDER — ACETAMINOPHEN 325 MG PO TABS
650.0000 mg | ORAL_TABLET | Freq: Four times a day (QID) | ORAL | Status: DC | PRN
Start: 2024-03-01 — End: 2024-03-01
  Administered 2024-03-01: 650 mg via ORAL
  Filled 2024-03-01: qty 2

## 2024-03-01 MED ORDER — PANTOPRAZOLE SODIUM 40 MG IV SOLR
40.0000 mg | Freq: Once | INTRAVENOUS | Status: AC
  Administered 2024-03-01: 40 mg via INTRAVENOUS
  Filled 2024-03-01: qty 10

## 2024-03-01 MED ORDER — LORAZEPAM 2 MG/ML IJ SOLN
1.0000 mg | Freq: Once | INTRAMUSCULAR | Status: AC
  Administered 2024-03-01: 1 mg via INTRAVENOUS
  Filled 2024-03-01: qty 1

## 2024-03-01 MED ORDER — PANTOPRAZOLE SODIUM 40 MG IV SOLR: 40.0000 mg | INTRAVENOUS | Status: DC

## 2024-03-01 MED ORDER — TRAZODONE HCL 50 MG PO TABS
50.0000 mg | ORAL_TABLET | Freq: Every day | ORAL | Status: DC
  Administered 2024-03-01 – 2024-03-02 (×2): 50 mg via ORAL
  Filled 2024-03-01 (×2): qty 1

## 2024-03-01 MED ORDER — ONDANSETRON HCL 4 MG/2ML IJ SOLN
4.0000 mg | Freq: Once | INTRAMUSCULAR | Status: AC
  Administered 2024-03-01: 4 mg via INTRAVENOUS
  Filled 2024-03-01: qty 2

## 2024-03-01 MED ORDER — HYDROXYCHLOROQUINE SULFATE 200 MG PO TABS
200.0000 mg | ORAL_TABLET | Freq: Every day | ORAL | Status: DC
  Administered 2024-03-02 – 2024-03-03 (×2): 200 mg via ORAL
  Filled 2024-03-01 (×2): qty 1

## 2024-03-01 MED ORDER — PROCHLORPERAZINE EDISYLATE 10 MG/2ML IJ SOLN
10.0000 mg | Freq: Four times a day (QID) | INTRAMUSCULAR | Status: DC | PRN
  Administered 2024-03-01 – 2024-03-02 (×2): 10 mg via INTRAVENOUS
  Filled 2024-03-01 (×2): qty 2

## 2024-03-01 MED ORDER — SODIUM CHLORIDE 0.9 % IV BOLUS
1000.0000 mL | Freq: Once | INTRAVENOUS | Status: AC
  Administered 2024-03-01: 1000 mL via INTRAVENOUS

## 2024-03-01 MED ORDER — OXYCODONE HCL 5 MG PO TABS
5.0000 mg | ORAL_TABLET | Freq: Four times a day (QID) | ORAL | Status: DC | PRN
  Administered 2024-03-01 – 2024-03-02 (×3): 5 mg via ORAL
  Filled 2024-03-01 (×3): qty 1

## 2024-03-01 MED ORDER — IOHEXOL 350 MG/ML SOLN
75.0000 mL | Freq: Once | INTRAVENOUS | Status: AC | PRN
  Administered 2024-03-01: 75 mL via INTRAVENOUS

## 2024-03-01 NOTE — ED Provider Notes (Signed)
 [place] EMERGENCY DEPARTMENT AT [facility] Provider Note   CSN: [id number] Arrival date & time: 03/01/24  9350     Patient presents with: Hematemesis   Laura Montoya is a 38 y.o. female.   38 year old female present emergency department for abdominal pain and vomiting.  Symptoms started overnight.  Numerous episodes of vomiting.  Turned dark colored.  Notes abdominal pain is diffuse.  Had diarrhea this morning, no blood or dark-colored.  Not on blood thinner or taking NSAIDs chronically.  No surgical history.        Prior to Admission medications   Medication Sig Start Date End Date Taking? Authorizing Provider  albuterol (PROVENTIL HFA;VENTOLIN HFA) 108 (90 Base) MCG/ACT inhaler Inhale 1-2 puffs into the lungs every 6 (six) hours as needed for wheezing or shortness of breath.    [provider]  Azelastine HCl 137 MCG/SPRAY SOLN Place 2 sprays into both nostrils 2 (two) times daily as needed for allergies. 04/17/21   [provider]  buPROPion  (WELLBUTRIN  XL) 150 MG 24 hr tablet Take 150 mg by mouth daily. 02/14/23   [provider]  Cyanocobalamin  (B-12) 2500 MCG TABS Take 1 tablet by mouth daily.    [provider]  dicyclomine  (BENTYL ) 10 MG capsule Take 10 mg by mouth as needed. 05/23/23   [provider]  DULoxetine  (CYMBALTA ) 30 MG capsule Take 1 capsule (30 mg total) by mouth 2 (two) times daily. 07/04/23   Patel, Donika K, DO  fluticasone (FLONASE) 50 MCG/ACT nasal spray Place 1 spray into both nostrils as needed. 05/30/22   [provider]  folic acid  (FOLVITE ) 1 MG tablet Take 1 mg by mouth daily. 06/27/21   [provider]  hydrochlorothiazide (HYDRODIURIL) 12.5 MG tablet Take 12.5 mg by mouth as needed. 06/01/23   [provider]  HYDROcodone -acetaminophen  (NORCO/VICODIN) 5-325 MG tablet Take 1 tablet by mouth every 4 (four) hours as needed. 10/08/23 10/07/24  Sofia, Leslie K, PA-C   hydroxychloroquine (PLAQUENIL) 200 MG tablet Take 200 mg by mouth at bedtime.    [provider]  hydrOXYzine (ATARAX) 50 MG tablet Take 50 mg by mouth as needed. 02/19/19   [provider]  lidocaine  (LIDODERM ) 5 % Place 1 patch onto the skin daily. Remove & Discard patch within 12 hours or as directed by MD 11/22/22   Prosperi, Sherlean H, PA-C  loratadine (CLARITIN) 10 MG tablet Take 10 mg by mouth as needed. 12/27/22   [provider]  methotrexate (RHEUMATREX) 2.5 MG tablet Take 12.5 mg by mouth every Sunday. (5 tablets) 07/19/21   [provider]  metoCLOPramide  (REGLAN ) 10 MG tablet Take 1 tablet (10 mg total) by mouth every 6 (six) hours as needed for nausea or vomiting. 10/28/23   Haviland, Julie, MD  metoprolol succinate (TOPROL-XL) 50 MG 24 hr tablet Take 50 mg by mouth daily. 04/20/22   [provider]  ondansetron  (ZOFRAN -ODT) 8 MG disintegrating tablet Take 1 tablet (8 mg total) by mouth every 8 (eight) hours as needed for nausea or vomiting. 11/26/22   Raford Lenis, MD  pantoprazole  (PROTONIX ) 40 MG tablet Take 1 tablet (40 mg total) by mouth daily. 07/24/23   McMichael, Bayley M, PA-C  potassium chloride  20 MEQ TBCR Take 1 tablet (20 mEq total) by mouth daily. 10/30/22   Midge Golas, MD  promethazine  (PHENERGAN ) 25 MG suppository Place 1 suppository (25 mg total) rectally every 6 (six) hours as needed for nausea or vomiting. 01/17/23  Desiderio Chew, PA-C  thiamine  (VITAMIN B1) 100 MG tablet Take 100 mg by mouth as needed. 02/19/19   [provider]  tiZANidine  (ZANAFLEX ) 4 MG tablet Take 2-4 mg by mouth 3 (three) times daily as needed.    [provider]  traZODone (DESYREL) 50 MG tablet Take 50 mg by mouth at bedtime as needed. 07/06/23   [provider]    Allergies: Nsaids and Carbamazepine    Review of Systems  Updated Vital Signs BP (!) 114/58   Pulse 68   Temp 98.4 F (36.9 C) (Oral)   Resp 17   Ht 5'  3 (1.6 m)   Wt 49.9 kg   SpO2 100%   BMI 19.49 kg/m   Physical Exam Vitals and nursing note reviewed.  Constitutional:      General: She is not in acute distress.    Appearance: She is not toxic-appearing.  HENT:     Head: Normocephalic.     Nose: Nose normal.     Mouth/Throat:     Mouth: Mucous membranes are moist.  Eyes:     Conjunctiva/sclera: Conjunctivae normal.  Cardiovascular:     Rate and Rhythm: Normal rate and regular rhythm.  Pulmonary:     Effort: Pulmonary effort is normal.     Breath sounds: Normal breath sounds.  Abdominal:     General: Abdomen is flat. There is no distension.     Tenderness: There is abdominal tenderness. There is guarding.  Musculoskeletal:        General: Normal range of motion.  Skin:    General: Skin is warm and dry.     Capillary Refill: Capillary refill takes less than 2 seconds.  Neurological:     Mental Status: She is alert and oriented to person, place, and time.  Psychiatric:        Mood and Affect: Mood normal.        Behavior: Behavior normal.     (all labs ordered are listed, but only abnormal results are displayed) Labs Reviewed  COMPREHENSIVE METABOLIC PANEL WITH GFR - Abnormal; Notable for the following components:      Result Value   Chloride 94 (*)    CO2 20 (*)    Glucose, Bld 164 (*)    Total Bilirubin 1.4 (*)    Anion gap 22 (*)    All other components within normal limits  CBC - Abnormal; Notable for the following components:   WBC 20.7 (*)    MCH 34.1 (*)    MCHC 36.1 (*)    Platelets 417 (*)    All other components within normal limits  ACETAMINOPHEN  LEVEL - Abnormal; Notable for the following components:   Acetaminophen  (Tylenol ), Serum <10 (*)    All other components within normal limits  SALICYLATE LEVEL - Abnormal; Notable for the following components:   Salicylate Lvl <7.0 (*)    All other components within normal limits  CBC WITH DIFFERENTIAL/PLATELET - Abnormal; Notable for the following  components:   WBC 16.0 (*)    RBC 3.64 (*)    HCT 34.8 (*)    MCH 34.3 (*)    Neutro Abs 14.0 (*)    All other components within normal limits  I-STAT CG4 LACTIC ACID, ED - Abnormal; Notable for the following components:   Lactic Acid, Venous 6.1 (*)    All other components within normal limits  I-STAT VENOUS BLOOD GAS, ED - Abnormal; Notable for the following components:   pH,  Ven 7.614 (*)    pCO2, Ven 22.5 (*)    pO2, Ven 150 (*)    Acid-Base Excess 3.0 (*)    Sodium 132 (*)    Calcium, Ion 1.00 (*)    All other components within normal limits  POC OCCULT BLOOD, ED - Abnormal; Notable for the following components:   Fecal Occult Bld POSITIVE (*)    All other components within normal limits  RESP PANEL BY RT-PCR (RSV, FLU A&B, COVID)  RVPGX2  LIPASE, BLOOD  HCG, SERUM, QUALITATIVE  ETHANOL  HIV ANTIBODY (ROUTINE TESTING W REFLEX)  URINALYSIS, ROUTINE W REFLEX MICROSCOPIC  RAPID URINE DRUG SCREEN, HOSP PERFORMED  BILIRUBIN, DIRECT  CBC  GASTRIC OCCULT BLOOD (1-CARD TO LAB)  I-STAT CG4 LACTIC ACID, ED  TYPE AND SCREEN  ABO/RH    EKG: EKG Interpretation Date/Time:  Friday March 01 2024 07:59:24 EST Ventricular Rate:  58 PR Interval:  103 QRS Duration:  116 QT Interval:  566 QTC Calculation: 556 R Axis:   83  Text Interpretation: Sinus rhythm Short PR interval Incomplete right bundle branch block Confirmed by Neysa Clap [phone]) on 03/01/2024 8:30:05 AM  Radiology: CT ABDOMEN PELVIS W CONTRAST Result Date: 03/01/2024 EXAM: CT ABDOMEN AND PELVIS WITH CONTRAST 03/01/2024 09:57:06 AM TECHNIQUE: CT of the abdomen and pelvis was performed with the administration of intravenous contrast. 75 mL iohexol  (OMNIPAQUE ) 350 MG/ML injection was administered. Multiplanar reformatted images are provided for review. Automated exposure control, iterative reconstruction, and/or weight-based adjustment of the mA/kV was utilized to reduce the radiation dose to as low as reasonably  achievable. COMPARISON: 05/30/2023 CLINICAL HISTORY: Abdominal pain, acute, nonlocalized. FINDINGS: LOWER CHEST: No acute abnormality. LIVER: The liver is unremarkable. GALLBLADDER AND BILE DUCTS: Gallbladder is unremarkable. No biliary ductal dilatation. SPLEEN: No acute abnormality. PANCREAS: No acute abnormality. ADRENAL GLANDS: No acute abnormality. KIDNEYS, URETERS AND BLADDER: No stones in the kidneys or ureters. No hydronephrosis. No perinephric or periureteral stranding. Urinary bladder is unremarkable. GI AND BOWEL: Stomach demonstrates no acute abnormality. There is no bowel obstruction. PERITONEUM AND RETROPERITONEUM: No ascites. No free air. VASCULATURE: Aorta is normal in caliber. LYMPH NODES: No lymphadenopathy. REPRODUCTIVE ORGANS: Enlarged left periuterine veins are noted with enlarged left ovarian vein suggesting pelvic congestion syndrome. BONES AND SOFT TISSUES: No acute osseous abnormality. No focal soft tissue abnormality. IMPRESSION: 1. Enlarged left periuterine veins and left ovarian vein, suggestive of pelvic congestion syndrome. Electronically signed by: Lynwood Seip MD 03/01/2024 10:31 AM EST RP Workstation: [id number]   DG Chest Portable 1 View Result Date: 03/01/2024 EXAM: 1 VIEW(S) XRAY OF THE CHEST 03/01/2024 08:13:00 AM COMPARISON: 12/24/2022 CLINICAL HISTORY: vomiting FINDINGS: LUNGS AND PLEURA: No focal pulmonary opacity. No pulmonary edema. No pleural effusion. No pneumothorax. HEART AND MEDIASTINUM: No acute abnormality of the cardiac and mediastinal silhouettes. BONES AND SOFT TISSUES: No acute osseous abnormality. IMPRESSION: 1. No acute cardiopulmonary abnormality. Electronically signed by: Rogelia Myers MD 03/01/2024 08:31 AM EST RP Workstation: [id number]     Procedures   Medications Ordered in the ED  acetaminophen  (TYLENOL ) tablet 650 mg (has no administration in time range)    Or  acetaminophen  (TYLENOL ) suppository 650 mg (has no administration in time range)   nicotine (NICODERM CQ - dosed in mg/24 hours) patch 21 mg (21 mg Transdermal Not Given 03/01/24 1341)  lactated ringers  infusion ( Intravenous New Bag/Given 03/01/24 1346)  pantoprazole  (PROTONIX ) injection 40 mg (has no administration in time range)  prochlorperazine  (COMPAZINE ) injection 10 mg (has no administration in  time range)  enoxaparin  (LOVENOX ) injection 30 mg (has no administration in time range)  albuterol (PROVENTIL) (2.5 MG/3ML) 0.083% nebulizer solution 2.5 mg (has no administration in time range)  pantoprazole  (PROTONIX ) injection 40 mg (40 mg Intravenous Given 03/01/24 0739)  ondansetron  (ZOFRAN ) injection 4 mg (4 mg Intravenous Given 03/01/24 0738)  fentaNYL  (SUBLIMAZE ) injection 50 mcg (50 mcg Intravenous Given 03/01/24 0810)  sodium chloride  0.9 % bolus 1,000 mL (0 mLs Intravenous Stopped 03/01/24 1029)  iohexol  (OMNIPAQUE ) 350 MG/ML injection 75 mL (75 mLs Intravenous Contrast Given 03/01/24 0949)  LORazepam  (ATIVAN ) injection 1 mg (1 mg Intravenous Given 03/01/24 1241)    Clinical Course as of 03/01/24 1625  Fri Mar 01, 2024  0826 WBC(!): 20.7 [TY]  0826 Hemoglobin: 14.1 Hemoglobin stable [TY]  0828 Anion gap(!): 22 Patient with a.  Bicarb 20 as well.  Will add on screening labs.  Her blood sugar is only 164, no history of diabetes.  DKA felt to be less likely.  Starvation ketosis in the setting of her decreased p.o. intake, nausea vomiting diarrhea? [TY]  0830 DG Chest Portable 1 View On My independent review of images.  I do not appreciate free air in the diaphragm.  No pneumomediastinum. [TY]  [id number] Lactic Acid, Venous(!!): 6.1 Anioin gap likely 2/2 elevated lactic.  [TY]  [id number] Fecal Occult Blood, POC(!): POSITIVE [TY]  1050 Lactic Acid, Venous: 1.7 [TY]  1051 CT ABDOMEN PELVIS W CONTRAST IMPRESSION: 1. Enlarged left periuterine veins and left ovarian vein, suggestive of pelvic congestion syndrome.   [TY]  1059 Patient has stopped actively vomiting.  But continues to  somewhat dry heave.  Per chart review does have a history of cyclical vomiting.  CT abdomen without free air or acute process.  Given hematemesis and inability to tolerate p.o. although admit for observation and possible EGD/GI evaluation. [TY]  1212 Spoke with family medicine teaching service, will come evaluate patient for admission. [TY]    Clinical Course User Index [TY] Neysa Caron PARAS, DO                                 Medical Decision Making This is a 38 year old female history of schizophrenia, GERD, bipolar, rheumatoid arthritis and asthma presenting emergency department for abdominal pain nausea vomiting.  She is afebrile nontachycardic, hemodynamically stable.  Vomiting in the room small amounts.  Does not appear dark colored to the vomitus.  She is tender in her abdomen diffusely.  Given Zofran  for nausea, will give IV Protonix  for possible upper GI bleed.  Fentanyl  for pain.  Will get screening labs, transfuse as indicated.  Will also get chest x-ray and abdominal CT scan.  See ED course for further MDM and disposition.  Amount and/or Complexity of Data Reviewed Independent Historian: parent    Details: Noted vomiting all night External Data Reviewed:     Details: Not on blood thinner Labs: ordered. Decision-making details documented in ED Course. Radiology: ordered and independent interpretation performed. Decision-making details documented in ED Course.    Details: Do not appreciate free air on CT scan. ECG/medicine tests: ordered and independent interpretation performed. Decision-making details documented in ED Course. Discussion of management or test interpretation with external provider(s): GI, hospitalist for admission.  Risk Prescription drug management. Decision regarding hospitalization. Diagnosis or treatment significantly limited by social determinants of health. Risk Details: Poor health literacy      Final diagnoses:  None  ED Discharge Orders      None          Neysa Caron PARAS, DO 03/01/24 1625

## 2024-03-01 NOTE — Plan of Care (Signed)
 FMTS Brief Progress Note  S: Patient was seen and evaluated at bedside by Dr. Alba and myself. She states she is doing well, denies any recent N/V/D, abd pain, or any other complaints at this time.   O: BP 115/81   Pulse (!) 107   Temp 98.6 F (37 C) (Oral)   Resp 18   Ht 5' 3 (1.6 m)   Wt 49.9 kg   SpO2 100%   BMI 19.49 kg/m   GEN: comfortably lying supine in hospital bed in no acute distress CV: RRR, no murmurs, radial pulses 2+ RESP: CTAB, normal WOB Abd: soft, non-tender, non-distended, BS present  A/P: - Continue plan as per day team H&P - Orders reviewed. Labs for AM ordered, which was adjusted as needed.  - If condition changes, plan includes N/V control, pain management, page FMTS.   Lupie Credit, DO 03/01/2024, 8:29 PM PGY-1, [facility] Night Resident  Please page [phone] with questions.

## 2024-03-01 NOTE — ED Notes (Signed)
 CCMD called by this RN

## 2024-03-01 NOTE — ED Triage Notes (Signed)
 Patient c/o cold like sx. Onset last Thurs. States she started vomiting blood last pm,

## 2024-03-01 NOTE — Hospital Course (Addendum)
 Laura Montoya is a 38 y.o.female with a history of Idiopathic scoliosis, Schizophrenia, Bipolar, Anxiety, Asthma, and RA who was admitted to the [facility] Medicine Teaching Service at [facility] for hematemesis. Her hospital course is detailed below:  Erosive esophagitis/gastritis  Laura Montoya is a 38 y.o. female presenting with diffuse lower abdominal pain , N/V starting last night. Patient states he has been having some diarrhea w/ cold symptoms starting 7 days ago. Start having some N/V in the last 12 hrs. Per patient she has been throwing up every 3-4 minutes. She noticed that her emesis also has stalk blood/ blood clot in it. Patient c/o emesis every few minutes in last 12 hours. In ED Fecal occult + .GI consulted and now she is s/p EGD on 11/8 which show ***  2.   Other chronic conditions were medically managed with home medications and formulary alternatives as necessary (***)  PCP Follow-up Recommendations: F/u for anxiety control  Smoking cessation  Referral to Gynecologist for pelvic congestion syndrome PPI BID x 4 weeks per GI

## 2024-03-01 NOTE — Plan of Care (Signed)
 Discussed home meds with patient:  Report she is only taking plaquenil, cymbalta , and folate at home. Everything else is either old or as needed.  Reordered Cymbalta  and Plaquenil to restart post EGD tomorrow.  Pharm tech med rec pending.

## 2024-03-01 NOTE — ED Notes (Signed)
 CCMD notified to place pt on monitor.

## 2024-03-01 NOTE — Assessment & Plan Note (Addendum)
 Hold home medication : pending home medication reconciliation by pharmacy  Idiopathic scoliosis : tiZanidine  42-4 mg TID Schizophrenia/ Bipolar : Abilify  Daily Anxiety : buPROPion   150 mg daily, Cymbalta  30 mg BID, ATARAX 50 mg Insomnia : Trazodone 50 mg QHS  Asthma : Albuterol  Seasonal Allergy : Azelastine, Flonase, Claritin  IBS : BENTYLE 10 mg capsule  GERD : Protonix  40 mg Daily RA: Methotrexate 12.5 Q Sunday, hydroxychloroquine (PLAQUENIL) 200 MG tablet HTN : State has been stop taking Hydrodiuril 12.5 mg , Metoprolol succinate 50 mg Daily

## 2024-03-01 NOTE — Consult Note (Signed)
 Consultation  Referring Provider: ER MD/MC/Young Primary Care Physician:  Montey Lot, PA-C Primary Gastroenterologist:  Dr. Leigh  Reason for Consultation: Intractable nausea vomiting, coffee-ground emesis  HPI: Laura Montoya is a 38 y.o. female with history of bipolar disorder, schizophrenia, chronic anxiety, GERD, and rheumatoid arthritis.  She has had a tentative diagnosis of cyclic vomiting.  She has been seen in our office previously and was last seen in March 2025 at that time with continued complaints of intermittent nausea vomiting and upper abdominal discomfort.  She was advised to continue twice daily PPI, and stop cannabis use.  She had HIDA scan done which was normal.  Has had previous EGD in 2017 done for dysphagia at that time which showed a 2 cm hiatal hernia, mild gastritis and otherwise negative exam.  Biopsies of the esophagus were negative for any evidence of eosinophilic esophagitis, and gastric biopsies negative, no H. pylori.  Came to the ER today with complaints of intractable nausea and vomiting over the past 4 days.  She says she initially got sick this past weekend with cold type symptoms with runny nose, cough, fatigue low-grade fever some chest congestion.  She says she was basically in bed most of the weekend.  She slept a lot earlier this week.  She started feeling nauseated on Wednesday, and then yesterday tried to eat, had nausea then vomiting and then could not stop vomiting, had multiple episodes at home.  Initially there was no evidence of coffee-ground material or blood but after several episodes of emesis he started bringing up dark coffee-ground type material.  She has not had any melena or hematochezia. She denies any aspirin or NSAID use, says she uses Tylenol  only, no EtOH.  She does have a prescription for PPI at home but has not been taking it and says she has difficulty remembering to take medications.  She also has Phenergan  suppositories  at home but thinks she may have run out.  Sometimes these will be helpful if she develops an episode of prolonged vomiting. She says she has had problems with periodic nausea and vomiting over the past 6 years and both she and her mother both feel that her episodes are brought on by increasing anxiety.  She actually has not had an episode over the past several months. She says she has been under a lot of stress recently, living with her parents and trying to get her daughter and herself out on their own.  She does have some conflict with her dad who is also in the home.  Workup in the ER today with labs show lipase within normal limits, glucose 164, LFTs within normal limits WBC 20.7/hemoglobin 14.1/hematocrit 39.1 Creatinine 0.98 Respiratory panel negative Stool Hemoccult positive.  CT of the abdomen and pelvis with contrast is unremarkable other than enlarged left periuterine veins and left ovarian vein suggestive of pelvic congestion syndrome.  Patient does receive her primary care and psychiatric care through the [facility] in [place].  She says she has been on BuSpar  long-term which does not help her anxiety, does not have a prescription for other anxiolytic.  She does have an upcoming appointment in December.   Past Medical History:  Diagnosis Date   Anxiety    Asthma    Bipolar depression (HCC)    Dizziness    Dysmenorrhea    GERD (gastroesophageal reflux disease)    Headache    Mood disorder    RA (rheumatoid arthritis) (HCC)    Schizophrenia (  HCC)    Schizophrenia (HCC)    Scoliosis    Thrush     Past Surgical History:  Procedure Laterality Date   BREAST ENHANCEMENT SURGERY  2020   BREAST IMPLANT REMOVAL     MOUTH SURGERY  2012   NERVE REPAIR      Prior to Admission medications   Medication Sig Start Date End Date Taking? Authorizing Provider  albuterol (PROVENTIL HFA;VENTOLIN HFA) 108 (90 Base) MCG/ACT inhaler Inhale 1-2 puffs into the lungs every 6 (six) hours as  needed for wheezing or shortness of breath.    [provider]  Azelastine HCl 137 MCG/SPRAY SOLN Place 2 sprays into both nostrils 2 (two) times daily as needed for allergies. 04/17/21   [provider]  buPROPion  (WELLBUTRIN  XL) 150 MG 24 hr tablet Take 150 mg by mouth daily. 02/14/23   [provider]  Cyanocobalamin  (B-12) 2500 MCG TABS Take 1 tablet by mouth daily.    [provider]  dicyclomine  (BENTYL ) 10 MG capsule Take 10 mg by mouth as needed. 05/23/23   [provider]  DULoxetine  (CYMBALTA ) 30 MG capsule Take 1 capsule (30 mg total) by mouth 2 (two) times daily. 07/04/23   Patel, Donika K, DO  fluticasone (FLONASE) 50 MCG/ACT nasal spray Place 1 spray into both nostrils as needed. 05/30/22   [provider]  folic acid  (FOLVITE ) 1 MG tablet Take 1 mg by mouth daily. 06/27/21   [provider]  hydrochlorothiazide (HYDRODIURIL) 12.5 MG tablet Take 12.5 mg by mouth as needed. 06/01/23   [provider]  HYDROcodone -acetaminophen  (NORCO/VICODIN) 5-325 MG tablet Take 1 tablet by mouth every 4 (four) hours as needed. 10/08/23 10/07/24  Sofia, Leslie K, PA-C  hydroxychloroquine (PLAQUENIL) 200 MG tablet Take 200 mg by mouth at bedtime.    [provider]  hydrOXYzine (ATARAX) 50 MG tablet Take 50 mg by mouth as needed. 02/19/19   [provider]  lidocaine  (LIDODERM ) 5 % Place 1 patch onto the skin daily. Remove & Discard patch within 12 hours or as directed by MD 11/22/22   Prosperi, Sherlean H, PA-C  loratadine (CLARITIN) 10 MG tablet Take 10 mg by mouth as needed. 12/27/22   [provider]  methotrexate (RHEUMATREX) 2.5 MG tablet Take 12.5 mg by mouth every Sunday. (5 tablets) 07/19/21   [provider]  metoCLOPramide  (REGLAN ) 10 MG tablet Take 1 tablet (10 mg total) by mouth every 6 (six) hours as needed for nausea or vomiting. 10/28/23   Haviland, Julie, MD  metoprolol succinate (TOPROL-XL) 50  MG 24 hr tablet Take 50 mg by mouth daily. 04/20/22   [provider]  ondansetron  (ZOFRAN -ODT) 8 MG disintegrating tablet Take 1 tablet (8 mg total) by mouth every 8 (eight) hours as needed for nausea or vomiting. 11/26/22   Raford Lenis, MD  pantoprazole  (PROTONIX ) 40 MG tablet Take 1 tablet (40 mg total) by mouth daily. 07/24/23   McMichael, Bayley M, PA-C  potassium chloride  20 MEQ TBCR Take 1 tablet (20 mEq total) by mouth daily. 10/30/22   Midge Golas, MD  promethazine  (PHENERGAN ) 25 MG suppository Place 1 suppository (25 mg total) rectally every 6 (six) hours as needed for nausea or vomiting. 01/17/23   Desiderio Chew, PA-C  thiamine  (VITAMIN B1) 100 MG tablet Take 100 mg by mouth as needed. 02/19/19   [provider]  tiZANidine  (ZANAFLEX ) 4 MG tablet Take 2-4 mg by mouth 3 (three) times daily as needed.  [provider]  traZODone (DESYREL) 50 MG tablet Take 50 mg by mouth at bedtime as needed. 07/06/23   [provider]    Current Facility-Administered Medications  Medication Dose Route Frequency Provider Last Rate Last Admin   acetaminophen  (TYLENOL ) tablet 650 mg  650 mg Oral Q6H PRN Suknaim, Kulkaew B, DO       Or   acetaminophen  (TYLENOL ) suppository 650 mg  650 mg Rectal Q6H PRN Suknaim, Kulkaew B, DO       lactated ringers  infusion   Intravenous Continuous Suknaim, Kulkaew B, DO       nicotine (NICODERM CQ - dosed in mg/24 hours) patch 21 mg  21 mg Transdermal Daily Suknaim, Kulkaew B, DO       [START ON 03/02/2024] pantoprazole  (PROTONIX ) injection 40 mg  40 mg Intravenous Q12H Malin Cervini S, PA-C       prochlorperazine  (COMPAZINE ) injection 10 mg  10 mg Intravenous Q6H PRN Cristen Bredeson S, PA-C       Current Outpatient Medications  Medication Sig Dispense Refill   albuterol (PROVENTIL HFA;VENTOLIN HFA) 108 (90 Base) MCG/ACT inhaler Inhale 1-2 puffs into the lungs every 6 (six) hours as needed for wheezing or shortness of breath.      Azelastine HCl 137 MCG/SPRAY SOLN Place 2 sprays into both nostrils 2 (two) times daily as needed for allergies.     buPROPion  (WELLBUTRIN  XL) 150 MG 24 hr tablet Take 150 mg by mouth daily.     Cyanocobalamin  (B-12) 2500 MCG TABS Take 1 tablet by mouth daily.     dicyclomine  (BENTYL ) 10 MG capsule Take 10 mg by mouth as needed.     DULoxetine  (CYMBALTA ) 30 MG capsule Take 1 capsule (30 mg total) by mouth 2 (two) times daily. 180 capsule 3   fluticasone (FLONASE) 50 MCG/ACT nasal spray Place 1 spray into both nostrils as needed.     folic acid  (FOLVITE ) 1 MG tablet Take 1 mg by mouth daily.     hydrochlorothiazide (HYDRODIURIL) 12.5 MG tablet Take 12.5 mg by mouth as needed.     HYDROcodone -acetaminophen  (NORCO/VICODIN) 5-325 MG tablet Take 1 tablet by mouth every 4 (four) hours as needed. 12 tablet 0   hydroxychloroquine (PLAQUENIL) 200 MG tablet Take 200 mg by mouth at bedtime.     hydrOXYzine (ATARAX) 50 MG tablet Take 50 mg by mouth as needed.     lidocaine  (LIDODERM ) 5 % Place 1 patch onto the skin daily. Remove & Discard patch within 12 hours or as directed by MD 30 patch 0   loratadine (CLARITIN) 10 MG tablet Take 10 mg by mouth as needed.     methotrexate (RHEUMATREX) 2.5 MG tablet Take 12.5 mg by mouth every Sunday. (5 tablets)     metoCLOPramide  (REGLAN ) 10 MG tablet Take 1 tablet (10 mg total) by mouth every 6 (six) hours as needed for nausea or vomiting. 30 tablet 0   metoprolol succinate (TOPROL-XL) 50 MG 24 hr tablet Take 50 mg by mouth daily.     ondansetron  (ZOFRAN -ODT) 8 MG disintegrating tablet Take 1 tablet (8 mg total) by mouth every 8 (eight) hours as needed for nausea or vomiting. 20 tablet 0   pantoprazole  (PROTONIX ) 40 MG tablet Take 1 tablet (40 mg total) by mouth daily. 90 tablet 3   potassium chloride  20 MEQ TBCR Take 1 tablet (20 mEq total) by mouth daily. 5 tablet 0   promethazine  (PHENERGAN ) 25 MG suppository Place 1 suppository (25 mg total) rectally  every 6 (six)  hours as needed for nausea or vomiting. 12 each 0   thiamine  (VITAMIN B1) 100 MG tablet Take 100 mg by mouth as needed.     tiZANidine  (ZANAFLEX ) 4 MG tablet Take 2-4 mg by mouth 3 (three) times daily as needed.     traZODone (DESYREL) 50 MG tablet Take 50 mg by mouth at bedtime as needed.      Allergies as of 03/01/2024 - Reviewed 03/01/2024  Allergen Reaction Noted   Nsaids Anaphylaxis 03/30/2021   Carbamazepine Itching and Hives 05/12/2020    Family History  Problem Relation Age of Onset   Thyroid disease Mother    Asthma Mother    Diabetes Mother    Mental illness Father    Hyperlipidemia Father    Crohn's disease Brother    Diabetes Maternal Grandfather    Heart disease Paternal Grandmother    Colon cancer Paternal Grandmother    Uterine cancer Paternal Grandmother    Heart disease Paternal Grandfather    Anxiety disorder Daughter    Autism Daughter    Breast cancer Maternal Great-grandmother     Social History   Socioeconomic History   Marital status: Divorced    Spouse name: Not on file   Number of children: 1   Years of education: GED   Highest education level: Not on file  Occupational History   Occupation: N/A  Tobacco Use   Smoking status: Every Day    Current packs/day: 0.75    Average packs/day: 0.8 packs/day for 21.0 years (15.8 ttl pk-yrs)    Types: Cigarettes   Smokeless tobacco: Never  Vaping Use   Vaping status: Former   Devices: uses cbd  Substance and Sexual Activity   Alcohol use: Yes    Comment: occasional   Drug use: Yes    Types: Marijuana   Sexual activity: Not on file  Other Topics Concern   Not on file  Social History Narrative   Has not had caffeine for 1 month 05/27/15.  Lives with parents and daughter in a one story home.  Does not work.  Has a disability hearing coming up on July 16, 2015.     Worked as a conservation officer, nature for [employer]. Has not worked since 2015.     Education: GED      right handed   Lives in one story home    Lives  with family   Social Drivers of Health   Financial Resource Strain: Not on file  Food Insecurity: Low Risk  (01/12/2024)   Received from [facility]   Hunger Vital Sign    Within the past 12 months, you worried that your food would run out before you got money to buy more: Never true    Within the past 12 months, the food you bought just didn't last and you didn't have money to get more. : Never true  Transportation Needs: Unmet Transportation Needs (01/12/2024)   Received from [facility]    In the past 12 months, has lack of reliable transportation kept you from medical appointments, meetings, work or from getting things needed for daily living? : Yes  Physical Activity: Not on file  Stress: Not on file  Social Connections: Not on file  Intimate Partner Violence: Not on file    Review of Systems: Pertinent positive and negative review of systems were noted in the above HPI section.  All other review of systems was otherwise negative.   Physical Exam:  Vital signs in last 24 hours: Temp:  [98.2 F (36.8 C)-98.9 F (37.2 C)] 98.9 F (37.2 C) (11/07 1048) Pulse Rate:  [52-70] 54 (11/07 1030) Resp:  [19-31] 19 (11/07 1030) BP: (122-133)/(68-82) 127/74 (11/07 1030) SpO2:  [90 %-100 %] 100 % (11/07 1030) Weight:  [49.9 kg] 49.9 kg (11/07 0656)   General:   Alert,  Well-developed, thin white female pleasant and cooperative in NAD, mother at bedside Head:  Normocephalic and atraumatic. Eyes:  Sclera clear, no icterus.   Conjunctiva pink. Ears:  Normal auditory acuity. Nose:  No deformity, discharge,  or lesions. Mouth:  No deformity or lesions.   Neck:  Supple; no masses or thyromegaly. Lungs:  Clear throughout to auscultation.   No wheezes, crackles, or rhonchi.  Heart:  Regular rate and rhythm; no murmurs, clicks, rubs,  or gallops. Abdomen:  Soft, there is some tenderness across the upper abdomen, no guarding or rebound, no palpable mass or  hepatosplenomegaly Rectal: Not done, documented Hemoccult negative Msk:  Symmetrical without gross deformities. . Pulses:  Normal pulses noted. Extremities:  Without clubbing or edema. Neurologic:  Alert and  oriented x4;  grossly normal neurologically.  Anxious Skin:  Intact without significant lesions or rashes.. Psych:  Alert and cooperative. Normal mood and affect.  Intake/Output from previous day: No intake/output data recorded. Intake/Output this shift: Total I/O In: 1000 [IV Piggyback:1000] Out: 450 [Emesis/NG output:450]  Lab Results: Recent Labs    03/01/24 0752 03/01/24 0845  WBC 20.7*  --   HGB 14.1 13.9  HCT 39.1 41.0  PLT 417*  --    BMET Recent Labs    03/01/24 0659 03/01/24 0845  NA 136 132*  K 3.8 3.9  CL 94*  --   CO2 20*  --   GLUCOSE 164*  --   BUN 15  --   CREATININE 0.98  --   CALCIUM 9.7  --    LFT Recent Labs    03/01/24 0659  PROT 6.9  ALBUMIN 4.1  AST 33  ALT 18  ALKPHOS 72  BILITOT 1.4*   PT/INR No results for input(s): LABPROT, INR in the last 72 hours. Hepatitis Panel No results for input(s): HEPBSAG, HCVAB, HEPAIGM, HEPBIGM in the last 72 hours.    IMPRESSION:  #15 38 year old white female with history of chronic anxiety, bipolar disorder, schizophrenia, who presents to the emergency room with intractable nausea and vomiting onset 2 days ago now with associated coffee-ground emesis. Initial symptoms started about 6 days ago with cold type symptoms with runny nose cough, congestion low-grade fever fatigue etc.  Not been feeling well all week.  Some nausea and not eating much.  Yesterday developed vomiting which became intractable and then after multiple episodes did have coffee ground emesis. Hemoglobin is 14, WBC 20.1 CT unremarkable  I suspect retch gastropathy, versus ulcerative esophagitis in setting of intractable nausea and vomiting, cannot rule out peptic ulcer disease  #2 previous tentative diagnosis of  cyclic vomiting.  I had been question about cannabis hyperemesis in the past.  Patient still using cannabis though not on a daily basis, says she used to have episodes of nausea and vomiting every every 6 to 8 weeks though at this point she had gone multiple months without any episodes.  She and her mother both feel that her nausea and vomiting episodes are brought on by increasing anxiety.  #3 chronic anxiety #4.  Bipolar disorder #5.  History of GERD, not using regular PPI #6 history  of prolonged QT  Plan; clear liquid diet, then n.p.o. past midnight IV PPI twice daily Will start Compazine  10 mg IV every 6 hours as needed for nausea and vomiting in setting of prolonged QT, Ativan  would also be an option for her Trend hemoglobin IV fluid hydration Will schedule for EGD with Dr. Albertus for tomorrow 03/02/2024.  Procedures were discussed in detail with the patient including indications risks and benefits and she is agreeable to proceed. Ultimately she needs ongoing psychiatric care/stress management and a better regimen to control her anxiety as current regimen is not effective. GI will follow with you    Lawson Mahone PA-C 03/01/2024, 1:42 PM

## 2024-03-01 NOTE — ED Notes (Signed)
 Pt states they are unable to urinate, pt provided with urine cup.

## 2024-03-01 NOTE — H&P (Addendum)
 Hospital Admission History and Physical Service Pager: [phone]  Patient name: Laura Montoya Medical record number: [id number] Date of Birth: [birth year] Age: 38 y.o. Gender: female  Primary Care Provider: Montey Lot, PA-C  Consultants: GI Code Status: Full which was confirmed with family if patient unable to confirm   Preferred Emergency Contact:  Highfill,Robin (Mother) [phone] (Mobile)   Chief Complaint: abdominal pain and blood in emesis     Differential and Medical Decision Making:  Laura Montoya is a 38 y.o. female presenting with abdominal pain and Hematemesis   Differential for this patient's presentation of this includes Erosive esophagitis/gastritis : Most likely : Patient c/o emesis every few minutes in last 12 hours. States watery emesis w/ light pink - red fluid and clot. Stable H/H Per patient she has diarrhea starting last week states last BM was yesterday which was loose. Fecal occult + for blood.GI consulted in ED  Cyclic vomit syndrome could also consider d/t patient Marijuana but states last smoked 1 week ago   Mallory-Weiss syndrome is also possible considering history of Schizophrenia, Anxiety , mood disorder and BMI of 19.49. However, patient states this is a new onset and denies chronic N/V or induced emesis   PUD : less likely : Denies heart burn or gastric reflex on exam. Hr in 90s. Will continue PPI   Variceal bleed : unlikely as she is hemodynamically stable, She denies history of varices and alcohol use. Normal liver fx  w/Stable H/H.  Assessment & Plan Hematemesis Differential diagnosis as above. Hgb WNL at this time.  - Admit to FMTS, attending Dr. Anders  - Vital signs per floor - Consults: GI for hematemesis  - Fluids: IVF until tolerated PO  - No Antibiotics needed at this time  - Pain control: Tylenol  650 mg Q 6 H PRN - AM Labs: CMP, CBC - Daily EKG  - Fall precautions - IV PPI - PRN antimetics Respiratory  alkalosis ABG : ph 7.614 w/ pCO2 22.5(L) Lactic acid of 6.1- lactic acid improved to 1.7 after 1L of LR - Will continue to monitor - Pending UDS result  Chronic health problem Hold home medication : pending home medication reconciliation by pharmacy  Idiopathic scoliosis : tiZanidine  42-4 mg TID Schizophrenia/ Bipolar : Abilify  Daily Anxiety : buPROPion   150 mg daily, Cymbalta  30 mg BID, ATARAX 50 mg Insomnia : Trazodone 50 mg QHS  Asthma : Albuterol  Seasonal Allergy : Azelastine, Flonase, Claritin  IBS : BENTYLE 10 mg capsule  GERD : Protonix  40 mg Daily RA: Methotrexate 12.5 Q Sunday, hydroxychloroquine (PLAQUENIL) 200 MG tablet HTN : State has been stop taking Hydrodiuril 12.5 mg , Metoprolol succinate 50 mg Daily   FEN/GI: NPO until cleared by GI VTE Prophylaxis: SCD, Lovenox    Disposition: Progressive   History of Present Illness:  Laura Montoya is a 38 y.o. female presenting with diffuse lower abdominal pain , N/V starting last night. Patient states he has been having some diarrhea w/ cold symptoms starting 7 days ago. Start having some N/V in the last 12 hrs. Per patient she has been throwing up every 3-4 minutes. She noticed that her emesis also has stalk blood/ blood clot in it. She describe her emesis as watery bright red w/ clot. Denies fresh bloody emesis, dark stool but not obvious blood in BM. Last BM was yesterday which was loose. Denies Fever, s/s of infection, SOB, palpitation, and weakness   In the ED, she was hemodynamically stable  ABG shows respiratory alkalosis. Her pertinent labs included ABG : ph 7.614 w/ pCO2 22.5(L) Lactic acid of 6.1, and elevated WBC 20.7 w/ plt of 417 (H). With sCr(0.98) and BUN 15 w/ Normal LFT ASA 33 ALT 18 and normal H/H 14.1/39.1. EKG show QT/QTcB 566/556 ms   Review Of Systems: Per HPI with the following additions  Pertinent Past Medical History: Idiopathic scoliosis Schizophrenia  Bipolar  disorder Anxiety Asthma RA Remainder reviewed in history tab.   Pertinent Past Surgical History: Breast enhancement surgery 2020 Breast implant removal Nerve repair  Remainder reviewed in history tab.   Pertinent Social History: Tobacco use: Current, constant throughout day  Alcohol use: Rare, last drink 2 weeks ago  Other Substance use: Marijuana, last smoked 1 week ago  Lives with parents and daughter   Pertinent Family History: None Contribute   Important Outpatient Medications: Methotrexate 12.5 Q Sunday tiZanidine  42-4 mg TID buPROPion   150 mg daily Cymbalta  30 mg BID Abilify    Trazodone 50 mg QHS  Albuterol inhaler BENTYLE 10 mg capsule  Protonix  40 mg Daily   Objective: BP 127/74   Pulse (!) 54   Temp 98.9 F (37.2 C) (Oral)   Resp 19   Ht 5' 3 (1.6 m)   Wt 49.9 kg   SpO2 100%   BMI 19.49 kg/m  Exam: Physical Exam Cardiovascular:     Rate and Rhythm: Normal rate and regular rhythm.     Pulses: Normal pulses.  Pulmonary:     Effort: Pulmonary effort is normal.     Breath sounds: Normal breath sounds.  Abdominal:     General: Abdomen is flat. There is no distension.     Palpations: Abdomen is soft.     Tenderness: There is abdominal tenderness (lower abdomen tenderness w/ palpation). There is no guarding or rebound.  Musculoskeletal:        General: Normal range of motion.  Neurological:     General: No focal deficit present.     Mental Status: She is alert and oriented to person, place, and time. Mental status is at baseline.  Psychiatric:        Mood and Affect: Mood normal.        Thought Content: Thought content normal.      Labs:  CBC BMET  Recent Labs  Lab 03/01/24 0752 03/01/24 0845  WBC 20.7*  --   HGB 14.1 13.9  HCT 39.1 41.0  PLT 417*  --    Recent Labs  Lab 03/01/24 0659 03/01/24 0845  NA 136 132*  K 3.8 3.9  CL 94*  --   CO2 20*  --   BUN 15  --   CREATININE 0.98  --   GLUCOSE 164*  --   CALCIUM 9.7  --      Pertinent additional labs .  EKG: Vent. rate 58 BPM PR interval 103 ms QRS duration 116 ms QT/QTcB 566/556 ms P-R-T axes 73 83 83    Imaging Studies Performed: EXAM: CT ABDOMEN AND PELVIS WITH CONTRAST 03/01/2024 09:57:06 AM  IMPRESSION: 1. Enlarged left periuterine veins and left ovarian vein, suggestive of pelvic congestion syndrome.  Suzen Houston NOVAK, DO 03/01/2024, 12:10 PM PGY-1, [facility]  FPTS Intern pager: [phone], text pages welcome Secure chat group [facility] Teaching Service    I have verified that the service and findings are accurately documented in the resident's note above.  Damien Cassis, MD  03/01/2024, 5:28 PM

## 2024-03-01 NOTE — Assessment & Plan Note (Addendum)
 Differential diagnosis as above. Hgb WNL at this time.  - Admit to FMTS, attending Dr. Anders  - Vital signs per floor - Consults: GI for hematemesis  - Fluids: IVF until tolerated PO  - No Antibiotics needed at this time  - Pain control: Tylenol  650 mg Q 6 H PRN - AM Labs: CMP, CBC - Daily EKG  - Fall precautions - IV PPI - PRN antimetics

## 2024-03-01 NOTE — Assessment & Plan Note (Addendum)
 ABG : ph 7.614 w/ pCO2 22.5(L) Lactic acid of 6.1- lactic acid improved to 1.7 after 1L of LR - Will continue to monitor - Pending UDS result

## 2024-03-02 ENCOUNTER — Encounter (HOSPITAL_COMMUNITY): Payer: Self-pay

## 2024-03-02 ENCOUNTER — Observation Stay (HOSPITAL_COMMUNITY): Payer: MEDICAID | Admitting: Anesthesiology

## 2024-03-02 ENCOUNTER — Encounter (HOSPITAL_COMMUNITY): Admission: EM | Disposition: A | Discharge status: Home / Self Care | Payer: Self-pay | Source: Home / Self Care

## 2024-03-02 DIAGNOSIS — K920 Hematemesis: Secondary | ICD-10-CM

## 2024-03-02 DIAGNOSIS — J449 Chronic obstructive pulmonary disease, unspecified: Secondary | ICD-10-CM

## 2024-03-02 DIAGNOSIS — F419 Anxiety disorder, unspecified: Secondary | ICD-10-CM

## 2024-03-02 DIAGNOSIS — K209 Esophagitis, unspecified without bleeding: Secondary | ICD-10-CM

## 2024-03-02 DIAGNOSIS — K2289 Other specified disease of esophagus: Secondary | ICD-10-CM | POA: Diagnosis not present

## 2024-03-02 DIAGNOSIS — F17210 Nicotine dependence, cigarettes, uncomplicated: Secondary | ICD-10-CM

## 2024-03-02 DIAGNOSIS — R112 Nausea with vomiting, unspecified: Secondary | ICD-10-CM

## 2024-03-02 DIAGNOSIS — J4489 Other specified chronic obstructive pulmonary disease: Secondary | ICD-10-CM | POA: Diagnosis not present

## 2024-03-02 HISTORY — PX: ESOPHAGOGASTRODUODENOSCOPY: SHX5428

## 2024-03-02 LAB — CBC
HCT: 32.3 % — ABNORMAL LOW (ref 36.0–46.0)
Hemoglobin: 11.4 g/dL — ABNORMAL LOW (ref 12.0–15.0)
MCH: 33.9 pg (ref 26.0–34.0)
MCHC: 35.3 g/dL (ref 30.0–36.0)
MCV: 96.1 fL (ref 80.0–100.0)
Platelets: 262 K/uL (ref 150–400)
RBC: 3.36 MIL/uL — ABNORMAL LOW (ref 3.87–5.11)
RDW: 11.9 % (ref 11.5–15.5)
WBC: 12.7 K/uL — ABNORMAL HIGH (ref 4.0–10.5)
nRBC: 0 % (ref 0.0–0.2)

## 2024-03-02 LAB — BASIC METABOLIC PANEL WITH GFR
Anion gap: 14 (ref 5–15)
BUN: 13 mg/dL (ref 6–20)
CO2: 22 mmol/L (ref 22–32)
Calcium: 8.4 mg/dL — ABNORMAL LOW (ref 8.9–10.3)
Chloride: 99 mmol/L (ref 98–111)
Creatinine, Ser: 0.82 mg/dL (ref 0.44–1.00)
GFR, Estimated: >60 mL/min (ref >60)
Glucose, Bld: 65 mg/dL — ABNORMAL LOW (ref 70–99)
Potassium: 3.3 mmol/L — ABNORMAL LOW (ref 3.5–5.1)
Sodium: 135 mmol/L (ref 135–145)

## 2024-03-02 SURGERY — EGD (ESOPHAGOGASTRODUODENOSCOPY)
Anesthesia: General

## 2024-03-02 MED ORDER — SUGAMMADEX SODIUM 200 MG/2ML IV SOLN
INTRAVENOUS | Status: DC | PRN
  Administered 2024-03-02: 200 mg via INTRAVENOUS

## 2024-03-02 MED ORDER — PROPOFOL 500 MG/50ML IV EMUL
INTRAVENOUS | Status: DC | PRN
  Administered 2024-03-02: 100 ug/kg/min via INTRAVENOUS

## 2024-03-02 MED ORDER — POTASSIUM CHLORIDE CRYS ER 20 MEQ PO TBCR
40.0000 meq | EXTENDED_RELEASE_TABLET | Freq: Once | ORAL | Status: AC
  Administered 2024-03-02: 40 meq via ORAL
  Filled 2024-03-02: qty 2

## 2024-03-02 MED ORDER — PHENYLEPHRINE 80 MCG/ML (10ML) SYRINGE FOR IV PUSH (FOR BLOOD PRESSURE SUPPORT)
PREFILLED_SYRINGE | INTRAVENOUS | Status: DC | PRN
  Administered 2024-03-02 (×2): 160 ug via INTRAVENOUS

## 2024-03-02 MED ORDER — PROPOFOL 10 MG/ML IV BOLUS
INTRAVENOUS | Status: DC | PRN
  Administered 2024-03-02: 140 mg via INTRAVENOUS

## 2024-03-02 MED ORDER — ROCURONIUM BROMIDE 10 MG/ML (PF) SYRINGE
PREFILLED_SYRINGE | INTRAVENOUS | Status: DC | PRN
  Administered 2024-03-02: 20 mg via INTRAVENOUS

## 2024-03-02 MED ORDER — SODIUM CHLORIDE 0.9 % IV SOLN: INTRAVENOUS | Status: DC | PRN

## 2024-03-02 MED ORDER — ONDANSETRON HCL 4 MG/2ML IJ SOLN
INTRAMUSCULAR | Status: DC | PRN
  Administered 2024-03-02: 4 mg via INTRAVENOUS

## 2024-03-02 MED ORDER — DEXMEDETOMIDINE HCL IN NACL 80 MCG/20ML IV SOLN
INTRAVENOUS | Status: DC | PRN
  Administered 2024-03-02: 8 ug via INTRAVENOUS

## 2024-03-02 MED ORDER — MIDAZOLAM HCL (PF) 2 MG/2ML IJ SOLN
INTRAMUSCULAR | Status: DC | PRN
  Administered 2024-03-02: 2 mg via INTRAVENOUS

## 2024-03-02 MED ORDER — SUCCINYLCHOLINE CHLORIDE 200 MG/10ML IV SOSY
PREFILLED_SYRINGE | INTRAVENOUS | Status: DC | PRN
  Administered 2024-03-02: 120 mg via INTRAVENOUS

## 2024-03-02 NOTE — Assessment & Plan Note (Addendum)
 ABG : ph 7.614 w/ pCO2 22.5(L) Lactic acid of 6.1- lactic acid improved to 1.7 after 1L of LR - Will continue to monitor - UDS + for [facility]

## 2024-03-02 NOTE — Op Note (Signed)
 [facility] Patient Name: Laura Montoya Procedure Date : 03/02/2024 MRN: [id number] Attending MD: Gordy CHRISTELLA Starch , MD, [id number] Date of Birth: [birth year] CSN: [id number] Age: 38 Admit Type: Inpatient Procedure:                Upper GI endoscopy Indications:              Coffee-ground emesis, Nausea with vomiting Providers:                Gordy CHRISTELLA. Starch, MD, Collene Edu, RN, Joya Bunnell,                            Technician Referring MD:             [facility] Medicines:                Monitored Anesthesia Care Complications:            No immediate complications. Estimated Blood Loss:     Estimated blood loss was minimal. Procedure:                Pre-Anesthesia Assessment:                           - Prior to the procedure, a History and Physical                            was performed, and patient medications and                            allergies were reviewed. The patient's tolerance of                            previous anesthesia was also reviewed. The risks                            and benefits of the procedure and the sedation                            options and risks were discussed with the patient.                            All questions were answered, and informed consent                            was obtained. Prior Anticoagulants: The patient has                            taken no anticoagulant or antiplatelet agents. ASA                            Grade Assessment: II - A patient with mild systemic                            disease. After reviewing the risks and benefits,  the patient was deemed in satisfactory condition to                            undergo the procedure.                           After obtaining informed consent, the endoscope was                            passed under direct vision. Throughout the                            procedure, the patient's blood pressure, pulse, and                             oxygen saturations were monitored continuously. The                            [id number]-[id number] ([id number]) Olympus endoscope was introduced                            through the mouth, and advanced to the second part                            of duodenum. The upper GI endoscopy was                            accomplished without difficulty. The patient                            tolerated the procedure well. Scope In: Scope Out: Findings:      LA Grade D (one or more mucosal breaks involving at least 75% of       esophageal circumference) esophagitis with no bleeding was found in the       lower third of the esophagus. Biopsies were taken with a cold forceps       for histology.      Bilious fluid was found in the gastric fundus and in the gastric body.       Query element of gastroparesis. Removed with scope suction.      The entire examined stomach was normal.      The examined duodenum was normal. Impression:               - LA Grade D acute esophagitis with no active                            bleeding. Biopsied. This is the cause of recent                            coffee-ground emesis.                           - Bilious gastric fluid. Query a component of  gastroparesis.                           - Normal stomach mucosa.                           - Normal examined duodenum. Moderate Sedation:      N/A Recommendation:           - Return patient to hospital ward for ongoing care.                           - Clear liquid diet.                           - Advance diet as tolerated.                           - Continue present medications. Antiemetics as                            needed.                           - BID PPI x 4 weeks for esophagitis.                           - Await pathology results.                           - Okay for discharge once able to take oral                            hydration and nutrition. Procedure Code(s):         --- Professional ---                           [phone], Esophagogastroduodenoscopy, flexible,                            transoral; with biopsy, single or multiple Diagnosis Code(s):        --- Professional ---                           K20.90, Esophagitis, unspecified without bleeding                           K92.0, Hematemesis                           R11.2, Nausea with vomiting, unspecified CPT copyright 2022 American [employer]. All rights reserved. The codes documented in this report are preliminary and upon coder review may  be revised to meet current compliance requirements. Gordy CHRISTELLA Starch, MD 03/02/2024 4:52:37 PM This report has been signed electronically. Number of Addenda: 0

## 2024-03-02 NOTE — Anesthesia Procedure Notes (Signed)
 Procedure Name: Intubation Date/Time: 03/02/2024 4:27 PM  Performed by: Mollie Olivia SAUNDERS, CRNAPre-anesthesia Checklist: Patient identified, Emergency Drugs available, Suction available and Patient being monitored Patient Re-evaluated:Patient Re-evaluated prior to induction Oxygen Delivery Method: Circle system utilized Preoxygenation: Pre-oxygenation with 100% oxygen Induction Type: IV induction Laryngoscope Size: Mac and 3 Grade View: Grade I Tube type: Oral Tube size: 7.0 mm Number of attempts: 1 Airway Equipment and Method: Stylet Placement Confirmation: ETT inserted through vocal cords under direct vision, positive ETCO2 and breath sounds checked- equal and bilateral Secured at: 23 cm Tube secured with: Tape Dental Injury: Teeth and Oropharynx as per pre-operative assessment

## 2024-03-02 NOTE — Assessment & Plan Note (Addendum)
 Hgb WNL at this time.  - Vital signs per floor - Consults: GI for hematemesis   - EGD scheduled for 11/08, NPO at midnight - Fluids: IVF until tolerated PO  - No Antibiotics needed at this time  - Pain control: Tylenol  650 mg Q 6 H PRN - AM Labs: CMP, CBC - Fall precautions - IV PPI - PRN antimetics

## 2024-03-02 NOTE — Assessment & Plan Note (Addendum)
 States she is only taking plaquenil, cymbalta , and folate at home, with everything else PRN. Idiopathic scoliosis : tiZanidine  42-4 mg TID Schizophrenia/ Bipolar : Abilify  Daily Anxiety : buPROPion   150 mg daily, Cymbalta  30 mg BID, ATARAX 50 mg Insomnia : Trazodone 50 mg QHS  Asthma : Albuterol  Seasonal Allergy : Azelastine, Flonase, Claritin  IBS : BENTYLE 10 mg capsule  GERD : Protonix  40 mg Daily RA: Methotrexate 12.5 Q Sunday, hydroxychloroquine (PLAQUENIL) 200 MG tablet HTN : State has been stop taking Hydrodiuril 12.5 mg , Metoprolol succinate 50 mg Daily

## 2024-03-02 NOTE — Transfer of Care (Signed)
 Immediate Anesthesia Transfer of Care Note  Patient: Laura Montoya  Procedure(s) Performed: EGD (ESOPHAGOGASTRODUODENOSCOPY)  Patient Location: PACU  Anesthesia Type:General  Level of Consciousness: awake, alert , and oriented  Airway & Oxygen Therapy: Patient Spontanous Breathing  Post-op Assessment: Report given to RN, Post -op Vital signs reviewed and stable, and Patient moving all extremities X 4  Post vital signs: Reviewed and stable  Last Vitals:  Vitals Value Taken Time  BP 98/82 03/02/24 17:21  Temp 36.3 C 03/02/24 17:10  Pulse 77 03/02/24 17:25  Resp 17 03/02/24 17:25  SpO2 92 % 03/02/24 17:25  Vitals shown include unfiled device data.  Last Pain:  Vitals:   03/02/24 1715  TempSrc:   PainSc: 0-No pain         Complications: No notable events documented.

## 2024-03-02 NOTE — Anesthesia Preprocedure Evaluation (Signed)
 Anesthesia Evaluation  Patient identified by MRN, date of birth, ID band Patient awake    Reviewed: Allergy & Precautions, NPO status , Patient's Chart, lab work & pertinent test results  History of Anesthesia Complications Negative for: history of anesthetic complications  Airway Mallampati: I  TM Distance: >3 FB Neck ROM: Full    Dental  (+) Edentulous Upper, Edentulous Lower, Dental Advisory Given   Pulmonary asthma , COPD, Current Smoker and Patient abstained from smoking.   breath sounds clear to auscultation       Cardiovascular negative cardio ROS  Rhythm:Regular     Neuro/Psych  Headaches  Anxiety   Schizophrenia     GI/Hepatic ,GERD  ,,Intractable nausea vomiting, coffee-ground emesis   Endo/Other  negative endocrine ROS    Renal/GU negative Renal ROS     Musculoskeletal  (+) Arthritis ,    Abdominal   Peds  Hematology  (+) Blood dyscrasia, anemia Lab Results      Component                Value               Date                      WBC                      12.7 (H)            03/02/2024                HGB                      11.4 (L)            03/02/2024                HCT                      32.3 (L)            03/02/2024                MCV                      96.1                03/02/2024                PLT                      262                 03/02/2024              Anesthesia Other Findings   Reproductive/Obstetrics                              Anesthesia Physical Anesthesia Plan  ASA: 3  Anesthesia Plan: General   Post-op Pain Management: Minimal or no pain anticipated   Induction: Intravenous, Rapid sequence and Cricoid pressure planned  PONV Risk Score and Plan: 2 and Ondansetron  and Dexamethasone  Airway Management Planned: Oral ETT  Additional Equipment: None  Intra-op Plan:   Post-operative Plan: Extubation in OR  Informed Consent: I have  reviewed the patients History and Physical, chart, labs and discussed the procedure including the risks, benefits  and alternatives for the proposed anesthesia with the patient or authorized representative who has indicated his/her understanding and acceptance.     Dental advisory given  Plan Discussed with: CRNA  Anesthesia Plan Comments:          Anesthesia Quick Evaluation

## 2024-03-02 NOTE — Progress Notes (Signed)
     Daily Progress Note Intern Pager: [phone]  Patient name: Laura Montoya Medical record number: [id number] Date of birth: 04-16-1986 Age: 38 y.o. Gender: female  Primary Care Provider: Montey Lot, PA-C Consultants: GI Code Status: Full  Pt Overview and Major Events to Date:  11/07: Admitted for hematemesis  Medical Decision Making:  Ara Grandmaison Missy is a 21 yo F admitted for abd pain and hematemesis. Pertinent PMH/PSH includes marijuana use, schizophrenia, anxiety, mood disorder.  Assessment & Plan Hematemesis Hgb WNL at this time.  - Vital signs per floor - Consults: GI for hematemesis   - EGD scheduled for 11/08, NPO at midnight - Fluids: IVF until tolerated PO  - No Antibiotics needed at this time  - Pain control: Tylenol  650 mg Q 6 H PRN - AM Labs: CMP, CBC - Fall precautions - IV PPI - PRN antimetics Respiratory alkalosis ABG : ph 7.614 w/ pCO2 22.5(L) Lactic acid of 6.1- lactic acid improved to 1.7 after 1L of LR - Will continue to monitor - UDS + for THC  QT prolongation 556 on 11/07 EKG.  - Daily EKG - Avoid QT prolonging medication  Chronic health problem States she is only taking plaquenil, cymbalta , and folate at home, with everything else PRN. Idiopathic scoliosis : tiZanidine  42-4 mg TID Schizophrenia/ Bipolar : Abilify  Daily Anxiety : buPROPion   150 mg daily, Cymbalta  30 mg BID, ATARAX 50 mg Insomnia : Trazodone 50 mg QHS  Asthma : Albuterol  Seasonal Allergy : Azelastine, Flonase, Claritin  IBS : BENTYLE 10 mg capsule  GERD : Protonix  40 mg Daily RA: Methotrexate 12.5 Q Sunday, hydroxychloroquine (PLAQUENIL) 200 MG tablet HTN : State has been stop taking Hydrodiuril 12.5 mg , Metoprolol succinate 50 mg Daily   FEN/GI: NPO for EGD today PPx: Lovenox  Dispo:Home pending clinical improvement .   Subjective:  Patient was seen and examined at bedside. She states she is feeling well this morning and has no complaints. She denies  any overnight N/V/D or abd pain.  Objective: Temp:  [98.2 F (36.8 C)-98.9 F (37.2 C)] 98.5 F (36.9 C) (11/07 2039) Pulse Rate:  [52-107] 81 (11/07 2039) Resp:  [17-32] 18 (11/07 1817) BP: (102-140)/(58-82) 102/59 (11/07 2039) SpO2:  [90 %-100 %] 99 % (11/07 2039) Weight:  [49.9 kg] 49.9 kg (11/07 0656) Physical Exam: General: well appearing female resting comfortably supine in hospital bed in no acute distress Cardiovascular: RRR, no m/r/g, 2+ radial pulses Respiratory: CTAB, normal WOB, no w/r/r Abdomen: soft, non-tender, non-distended, faint BS present Extremities: no peripheral edema, moves all extremities equally  Laboratory: Most recent CBC Lab Results  Component Value Date   WBC 16.1 (H) 03/01/2024   HGB 11.9 (L) 03/01/2024   HCT 32.8 (L) 03/01/2024   MCV 94.3 03/01/2024   PLT 293 03/01/2024   Most recent BMP    Latest Ref Rng & Units 03/01/2024    8:45 AM  BMP  Sodium 135 - 145 mmol/L 132   Potassium 3.5 - 5.1 mmol/L 3.9    Lupie Credit, DO 03/02/2024, 5:23 AM  PGY-1, [facility] FPTS Intern pager: [phone], text pages welcome Secure chat group [facility] Teaching Service

## 2024-03-02 NOTE — Assessment & Plan Note (Addendum)
 556 on 11/07 EKG.  - Daily EKG - Avoid QT prolonging medication

## 2024-03-03 ENCOUNTER — Encounter (HOSPITAL_COMMUNITY): Payer: Self-pay

## 2024-03-03 DIAGNOSIS — J4489 Other specified chronic obstructive pulmonary disease: Secondary | ICD-10-CM | POA: Diagnosis not present

## 2024-03-03 DIAGNOSIS — F17210 Nicotine dependence, cigarettes, uncomplicated: Secondary | ICD-10-CM | POA: Diagnosis not present

## 2024-03-03 DIAGNOSIS — K209 Esophagitis, unspecified without bleeding: Secondary | ICD-10-CM | POA: Diagnosis not present

## 2024-03-03 DIAGNOSIS — K920 Hematemesis: Secondary | ICD-10-CM | POA: Diagnosis not present

## 2024-03-03 DIAGNOSIS — N9489 Other specified conditions associated with female genital organs and menstrual cycle: Secondary | ICD-10-CM | POA: Insufficient documentation

## 2024-03-03 LAB — CBC
HCT: 29.2 % — ABNORMAL LOW (ref 36.0–46.0)
Hemoglobin: 10.3 g/dL — ABNORMAL LOW (ref 12.0–15.0)
MCH: 34.1 pg — ABNORMAL HIGH (ref 26.0–34.0)
MCHC: 35.3 g/dL (ref 30.0–36.0)
MCV: 96.7 fL (ref 80.0–100.0)
Platelets: 234 K/uL (ref 150–400)
RBC: 3.02 MIL/uL — ABNORMAL LOW (ref 3.87–5.11)
RDW: 11.7 % (ref 11.5–15.5)
WBC: 6.2 K/uL (ref 4.0–10.5)
nRBC: 0 % (ref 0.0–0.2)

## 2024-03-03 LAB — BASIC METABOLIC PANEL WITH GFR
Anion gap: 11 (ref 5–15)
BUN: 8 mg/dL (ref 6–20)
CO2: 25 mmol/L (ref 22–32)
Calcium: 8.3 mg/dL — ABNORMAL LOW (ref 8.9–10.3)
Chloride: 100 mmol/L (ref 98–111)
Creatinine, Ser: 0.81 mg/dL (ref 0.44–1.00)
GFR, Estimated: >60 mL/min (ref >60)
Glucose, Bld: 70 mg/dL (ref 70–99)
Potassium: 3.6 mmol/L (ref 3.5–5.1)
Sodium: 136 mmol/L (ref 135–145)

## 2024-03-03 LAB — MAGNESIUM: Magnesium: 1.7 mg/dL (ref 1.7–2.4)

## 2024-03-03 MED ORDER — PANTOPRAZOLE SODIUM 40 MG PO TBEC: 40.0000 mg | DELAYED_RELEASE_TABLET | Freq: Two times a day (BID) | ORAL | 0 refills | Status: AC

## 2024-03-03 MED ORDER — ONDANSETRON 4 MG PO TBDP: 4.0000 mg | ORAL_TABLET | Freq: Three times a day (TID) | ORAL | 0 refills | Status: AC | PRN

## 2024-03-03 MED ORDER — MAGNESIUM SULFATE 2 GM/50ML IV SOLN
2.0000 g | Freq: Once | INTRAVENOUS | Status: AC
  Administered 2024-03-03: 2 g via INTRAVENOUS
  Filled 2024-03-03: qty 50

## 2024-03-03 NOTE — Progress Notes (Signed)
 Laura Montoya to be D/C'd  per MD order.  Discussed with the patient and all questions fully answered.  VSS, Skin clean, dry and intact without evidence of skin break down, no evidence of skin tears noted.  IV catheter discontinued intact. Site without signs and symptoms of complications. Dressing and pressure applied.  An After Visit Summary was printed and given to the patient.   D/c education completed with patient/family including follow up instructions, medication list, d/c activities limitations if indicated, with other d/c instructions as indicated by MD - patient able to verbalize understanding, all questions fully answered.   Patient instructed to return to ED, call 911, or call MD for any changes in condition.   Patient to be escorted via WC, and D/C home via private auto.

## 2024-03-03 NOTE — Assessment & Plan Note (Deleted)
 556 on 11/07, but improved to 468 11/8. - Avoid QT prolonging medication

## 2024-03-03 NOTE — Assessment & Plan Note (Addendum)
 S/p EGD 11/8, esophagitis. Will advance diet and assess if patient is able to tolerate. Magnesium  1.7, will plan to replete today - GI consult, appreciate recommendations - EGD showed esophagitis being the cause of recurrent coffee-ground emesis - BID PPI x 4 weeks for esophagitis, on IV PPI - Per GI, advance diet as tolerated - Mag 1.7, replete with 2g Mag IV today - Pain control: Tylenol  650 mg Q 6 H PRN - PRN antiemetics - Monitor I's and O's closely

## 2024-03-03 NOTE — Assessment & Plan Note (Addendum)
 Seen on CT abdomen, per patient symptomatic for the past year - Will place referral for OB/GYN at discharge  - Consider OCP initiation with PCP/ Gynecologist

## 2024-03-03 NOTE — Progress Notes (Addendum)
 Daily Progress Note Intern Pager: [phone]  Patient name: Laura Montoya Medical record number: [id number] Date of birth: [birth year] Age: 38 y.o. Gender: female  Primary Care Provider: Montey Lot, PA-C Consultants: GI Code Status: Full   Pt Overview and Major Events to Date:  11/7: Admitted to FMTS 11/8: EGD dx with Esophagitis   Assessment and Plan: Laura Montoya is a 38 y.o. female with a PMHx of schizophrenia and anxiety admitted for abdominal pain and hematemesis. EGD on 11/8 found esophagitis, GI recommends BID PPI x 4 weeks and op follow-up.  Assessment & Plan Hematemesis Acute esophagitis S/p EGD 11/8, esophagitis. Will advance diet and assess if patient is able to tolerate. Magnesium  1.7, will plan to replete today - GI consult, appreciate recommendations - EGD showed esophagitis being the cause of recurrent coffee-ground emesis - BID PPI x 4 weeks for esophagitis, on IV PPI - Per GI, advance diet as tolerated - Mag 1.7, replete with 2g Mag IV today - Pain control: Tylenol  650 mg Q 6 H PRN - PRN antiemetics - Monitor I's and O's closely  QT prolongation Qtc stable from 458 to 453 - Avoid QT prolonging medication  Pelvic congestion syndrome Seen on CT abdomen, per patient symptomatic for the past year - Will place referral for OB/GYN at discharge  - Consider OCP initiation with PCP/ Gynecologist  Respiratory alkalosis (Resolved: 03/03/2024) - Will continue to monitor - UDS + for THC  Chronic health problem States she is only taking plaquenil, cymbalta , and folate at home, with everything else PRN. Idiopathic scoliosis : Tizanidine  42-4 mg TID Schizophrenia/ Bipolar : Abilify  Daily Anxiety : buPROPion   150 mg daily, Cymbalta  30 mg BID, ATARAX 50 mg Insomnia : Trazodone 50 mg QHS  Asthma : Albuterol  Seasonal Allergy : Azelastine, Flonase, Claritin  IBS : BENTYLE 10 mg capsule  GERD : Protonix  40 mg Daily RA: Methotrexate 12.5 Q Sunday,  hydroxychloroquine (PLAQUENIL) 200 MG tablet HTN : State has been stop taking Hydrodiuril 12.5 mg , Metoprolol succinate 50 mg Daily    FEN/GI: Regular Diet PPx: Lovenox  Dispo:Home today. Barriers include if patient is able to tolerate diet.   Subjective:  Patient found lying in bed. States her throat is still sore, but overall doing well and is ready to go home.   Objective: Temp:  [97.4 F (36.3 C)-98.6 F (37 C)] 98.2 F (36.8 C) (11/09 0903) Pulse Rate:  [60-109] 64 (11/09 0903) Resp:  [14-25] 16 (11/09 0903) BP: (98-166)/(52-82) 108/67 (11/09 0903) SpO2:  [95 %-99 %] 99 % (11/09 0903)  Physical Exam: General: Well-appearing, no acute distress Cardio: Regular rate, regular rhythm, no murmurs on exam. Pulm: Clear, no wheezing, no crackles. No increased work of breathing Abdominal: bowel sounds present, soft, non-distended, slightly tender on LLQ Extremities: no peripheral edema    Laboratory: Most recent CBC Lab Results  Component Value Date   WBC 6.2 03/03/2024   HGB 10.3 (L) 03/03/2024   HCT 29.2 (L) 03/03/2024   MCV 96.7 03/03/2024   PLT 234 03/03/2024   Most recent BMP    Latest Ref Rng & Units 03/03/2024    5:20 AM  BMP  Glucose 70 - 99 mg/dL 70   BUN 6 - 20 mg/dL 8   Creatinine 9.55 - 8.99 mg/dL 9.18   Sodium 864 - 854 mmol/L 136   Potassium 3.5 - 5.1 mmol/L 3.6   Chloride 98 - 111 mmol/L 100   CO2 22 - 32  mmol/L 25   Calcium 8.9 - 10.3 mg/dL 8.3     Magnesium : 1.7   Imaging/Diagnostic Tests: EGD: Grade D acute esophagitis with no active bleeding. Biopsied. This is the cause of the recurrent coffee-ground emesis. Normal stomach mucosa, normal examined duodenum.   Elodie Palma, MD 03/03/2024, 12:15 PM  PGY-1, [facility] FPTS Intern pager: [phone], text pages welcome Secure chat group [facility] Teaching Service

## 2024-03-03 NOTE — Discharge Summary (Addendum)
 Family Medicine Teaching [facility] Discharge Summary  Patient name: Laura Montoya Medical record number: [id number] Date of birth: 05-06-1985 Age: 38 y.o. Gender: female Date of Admission: 03/01/2024  Date of Discharge: 03/03/2024 Admitting Physician: [place] KATHEE Samuels, DO  Primary Care Provider: Montey Lot, PA-C Consultants: GI  Indication for Hospitalization: Hematemesis and Abdominal Pain  Discharge Diagnoses/Problem List:  Principal Problem for Admission: Hematemesis Other Problems addressed during stay: QT prolongation, idiopathic scoliosis, schizophrenia, anxiety, insomnia, asthma, IBS, GERD, RA, HTN   Brief Hospital Course:  Laura Montoya is a 38 y.o.female with a history of Idiopathic scoliosis, Schizophrenia, Bipolar, Anxiety, Asthma, and RA who was admitted to the [facility] Medicine Teaching Service at [facility] for hematemesis. Her hospital course is detailed below:  Erosive esophagitis/gastritis  In ED Fecal occult + with multiple episodes of emesis.GI consulted and performed EGD on 11/8 which revealed Grade D acute esophagitis w/o active bleed, likely the cause of her coffee-ground emesis. EGD also showed bilious gastric fluid, for which a component of gastroparesis could be considered. Otherwise EGD with normal stomach and duodenum. Nausea and vomiting symptoms improved by day of discharge. Hgb remained stable throughout admission. Discharged on BID PPIx 4 weeks for esophagitis per GI recommendations.   Pelvic Congestion Syndrome CT showed evidence of pelvic congestion syndrome. Per patient, she has had lower abdominal pain for over a year now. Discharged with PCP recommendation for OB/Gyn referral for further management.   QT Prolongation On 11/07, EKG showed QT of 556. Monitored with daily EKGs and discontinuation of medications that prolong QT. QT decreased to 453 prior to discharge.   Other chronic conditions were medically managed with home medications  and formulary alternatives as necessary (IBS, GERD, RA, HTN, Anxiety, Schizophrenia/Bipolar, Scoliosis)  PCP Follow-up Recommendations: PPI BID x 4 weeks per GI F/u for anxiety control  F/u on Qtc prolongation and her medication list, as she is on multiple medications that can prolong Qtc Discuss smoking cessation  Referral to Gynecologist for pelvic congestion syndrome Follow up potassium levels.  Per chart review, patient previously given potassium as needed, this former prescription was held at discharge.    Results/Tests Pending at Time of Discharge:  Unresulted Labs (From admission, onward)    None        Disposition: Home  Discharge Condition: Stable  Discharge Exam:  Vitals:   03/03/24 0429 03/03/24 0903  BP: 100/63 108/67  Pulse: 60 64  Resp: 14 16  Temp: 98.6 F (37 C) 98.2 F (36.8 C)  SpO2: 96% 99%   Physical Exam: General: Well-appearing, no acute distress Cardio: Regular rate, regular rhythm, no murmurs on exam. Pulm: Clear, no wheezing, no crackles. No increased work of breathing Abdominal: bowel sounds present, soft, non-distended, slightly tender on LLQ Extremities: no peripheral edema   Significant Procedures:   EGD: LA grade D acute esophagitis with no active bleeding.  Biopsied.  This is the cause of the recent coffee-ground emesis.  Bilious gastric fluid.  No stomach mucosa.  Normal examined duodenum.  Significant Labs and Imaging:  Recent Labs  Lab 03/01/24 2042 03/02/24 0618 03/03/24 0520  WBC 16.1* 12.7* 6.2  HGB 11.9* 11.4* 10.3*  HCT 32.8* 32.3* 29.2*  PLT 293 262 234   Recent Labs  Lab 03/02/24 0618 03/03/24 0520  NA 135 136  K 3.3* 3.6  CL 99 100  CO2 22 25  GLUCOSE 65* 70  BUN 13 8  CREATININE 0.82 0.81  CALCIUM 8.4* 8.3*  MG  --  1.7   Pertinent imaging:  CT abdomen pelvis with contrast: Enlarged left periuterine veins and left ovarian vein, suggestive of pelvic congestion syndrome   Discharge Medications:   Allergies as of 03/03/2024       Reactions   Nsaids Anaphylaxis   4.26.2023 Pt reports that she is currently taking Celebrex, pt states she can't take Ibuprofen  but can take Naproxen    Carbamazepine Itching, Hives        Medication List     PAUSE taking these medications    hydrOXYzine 50 MG tablet Wait to take this until your doctor or other care provider tells you to start again. Commonly known as: ATARAX Take 50 mg by mouth as needed.       STOP taking these medications    metoCLOPramide  10 MG tablet Commonly known as: REGLAN    Potassium Chloride  ER 20 MEQ Tbcr   promethazine  25 MG suppository Commonly known as: PHENERGAN        TAKE these medications    acetaminophen  500 MG tablet Commonly known as: TYLENOL  Take 1,000 mg by mouth every 6 (six) hours as needed for mild pain (pain score 1-3) or moderate pain (pain score 4-6).   albuterol 108 (90 Base) MCG/ACT inhaler Commonly known as: VENTOLIN HFA Inhale 1-2 puffs into the lungs every 6 (six) hours as needed for wheezing or shortness of breath.   B-12 2500 MCG Tabs Take 1 tablet by mouth as needed.   DULoxetine  30 MG capsule Commonly known as: Cymbalta  Take 1 capsule (30 mg total) by mouth 2 (two) times daily.   fluticasone 50 MCG/ACT nasal spray Commonly known as: FLONASE Place 1 spray into both nostrils as needed.   folic acid  1 MG tablet Commonly known as: FOLVITE  Take 1 mg by mouth daily.   hydrochlorothiazide 12.5 MG tablet Commonly known as: HYDRODIURIL Take 12.5 mg by mouth as needed.   hydroxychloroquine 200 MG tablet Commonly known as: PLAQUENIL Take 200 mg by mouth at bedtime.   lidocaine  5 % Commonly known as: Lidoderm  Place 1 patch onto the skin daily. Remove & Discard patch within 12 hours or as directed by MD   loratadine 10 MG tablet Commonly known as: CLARITIN Take 10 mg by mouth as needed.   methotrexate 2.5 MG tablet Commonly known as: RHEUMATREX Take 12.5 mg by mouth  every Sunday. (5 tablets)   metoprolol succinate 50 MG 24 hr tablet Commonly known as: TOPROL-XL Take 50 mg by mouth as needed.   ondansetron  4 MG disintegrating tablet Commonly known as: ZOFRAN -ODT Take 1 tablet (4 mg total) by mouth every 8 (eight) hours as needed for nausea or vomiting.   pantoprazole  40 MG tablet Commonly known as: PROTONIX  Take 1 tablet (40 mg total) by mouth 2 (two) times daily. What changed: when to take this   thiamine  100 MG tablet Commonly known as: VITAMIN B1 Take 100 mg by mouth as needed.   tiZANidine  4 MG tablet Commonly known as: ZANAFLEX  Take 2-4 mg by mouth 3 (three) times daily as needed.   traZODone 50 MG tablet Commonly known as: DESYREL Take 50 mg by mouth at bedtime as needed.        Discharge Instructions: Please refer to Patient Instructions section of EMR for full details.  Patient was counseled important signs and symptoms that should prompt return to medical care, changes in medications, dietary instructions, activity restrictions, and follow up appointments.   Follow-Up Appointments:  Future Appointments  Date Time Provider Department Center  07/09/2024  2:30 PM Patel, Donika K, DO [facility] None    Elodie Palma, MD 03/03/2024, 2:29 PM PGY-1, [facility]   I have verified that the service and findings are accurately documented in the resident's note above.  Damien Cassis, MD                  03/03/2024, 3:35 PM

## 2024-03-03 NOTE — Assessment & Plan Note (Deleted)
 States she is only taking plaquenil, cymbalta , and folate at home, with everything else PRN. Idiopathic scoliosis : tiZanidine  42-4 mg TID Schizophrenia/ Bipolar : Abilify  Daily Anxiety : buPROPion   150 mg daily, Cymbalta  30 mg BID, ATARAX 50 mg Insomnia : Trazodone 50 mg QHS  Asthma : Albuterol  Seasonal Allergy : Azelastine, Flonase, Claritin  IBS : BENTYLE 10 mg capsule  GERD : Protonix  40 mg Daily RA: Methotrexate 12.5 Q Sunday, hydroxychloroquine (PLAQUENIL) 200 MG tablet HTN : State has been stop taking Hydrodiuril 12.5 mg , Metoprolol succinate 50 mg Daily

## 2024-03-03 NOTE — Assessment & Plan Note (Deleted)
-   GI consulted, appreciate recommendations - CLD, escalate as tolerated - Pain control: Tylenol  650 mg q6h prn for mild pain and Oxycodone  5 mg q6h prn for severe pain - AM labs pending: BMP, Mag - Transition IV PPI to PO - PRN antimetics

## 2024-03-03 NOTE — Assessment & Plan Note (Addendum)
 Qtc stable from 458 to 453 - Avoid QT prolonging medication

## 2024-03-03 NOTE — Assessment & Plan Note (Addendum)
-   Will continue to monitor - UDS + for [facility]

## 2024-03-03 NOTE — Assessment & Plan Note (Deleted)
 ABG: ph 7.614 w/ pCO2 22.5(L). Lactic acid improved from 6.1 to 1.7 after 1L of LR. - Will continue to monitor - UDS + for [facility]

## 2024-03-03 NOTE — Plan of Care (Signed)
  Problem: Health Behavior/Discharge Planning: Goal: Ability to manage health-related needs will improve Outcome: Progressing   Problem: Activity: Goal: Risk for activity intolerance will decrease Outcome: Progressing   Problem: Nutrition: Goal: Adequate nutrition will be maintained Outcome: Progressing   Problem: Safety: Goal: Ability to remain free from injury will improve Outcome: Progressing   Problem: Skin Integrity: Goal: Risk for impaired skin integrity will decrease Outcome: Progressing   

## 2024-03-03 NOTE — Discharge Instructions (Addendum)
 Dear Laura Montoya,   Thank you so much for allowing us  to be part of your care!  You were admitted to [facility] for vomiting and abdominal pain, and your test showed a small amount of blood in your stool. The GI team performed an upper endoscopy (EGD) on 11/8, which showed inflammation of your esophagus, likely causing the dark vomit. There was no active bleeding and your stomach and intestines looked normal. You are being discharged on a stomach acid medication (PPI) twice daily for 4 weeks to help your esophagus heal, as recommended by GI. During your hospitalization, CT scan of your abdomen diagnosed you with Pelvic Congestion Syndrome, which is most likely the cause of your chronic abdominal pain. Please follow up with PCP for a referral to an OB/Gyn for management of this condition.    POST-HOSPITAL & CARE INSTRUCTIONS Continue to take your stomach acid medication twice daily for 4 weeks for your esophagitis Follow up with PCP for a OB/Gyn referral for your pelvic congestion syndrome.  Follow up with your PCP within a week to follow-up about medication managements  Please let PCP/Specialists know of any changes that were made.  Please see medications section of this packet for any medication changes.   DOCTOR'S APPOINTMENT & FOLLOW UP CARE INSTRUCTIONS  Future Appointments  Date Time Provider Department Center  07/09/2024  2:30 PM Patel, Donika K, DO [facility] None    Take care and be well!  Family Medicine Teaching Service  [place]  [facility]  [address] [place], [place] [address] [phone]

## 2024-03-03 NOTE — Assessment & Plan Note (Signed)
 States she is only taking plaquenil, cymbalta , and folate at home, with everything else PRN. Idiopathic scoliosis : Tizanidine  42-4 mg TID Schizophrenia/ Bipolar : Abilify  Daily Anxiety : buPROPion   150 mg daily, Cymbalta  30 mg BID, ATARAX 50 mg Insomnia : Trazodone 50 mg QHS  Asthma : Albuterol  Seasonal Allergy : Azelastine, Flonase, Claritin  IBS : BENTYLE 10 mg capsule  GERD : Protonix  40 mg Daily RA: Methotrexate 12.5 Q Sunday, hydroxychloroquine (PLAQUENIL) 200 MG tablet HTN : State has been stop taking Hydrodiuril 12.5 mg , Metoprolol succinate 50 mg Daily

## 2024-03-04 ENCOUNTER — Encounter (HOSPITAL_COMMUNITY): Payer: Self-pay

## 2024-03-04 ENCOUNTER — Emergency Department (HOSPITAL_COMMUNITY): Payer: MEDICAID

## 2024-03-04 ENCOUNTER — Other Ambulatory Visit: Payer: Self-pay

## 2024-03-04 ENCOUNTER — Emergency Department (HOSPITAL_COMMUNITY)
Admission: EM | Admit: 2024-03-04 | Discharge: 2024-03-04 | Disposition: A | Discharge status: Home / Self Care | Payer: MEDICAID | Attending: Emergency Medicine | Admitting: Emergency Medicine

## 2024-03-04 DIAGNOSIS — R519 Headache, unspecified: Secondary | ICD-10-CM | POA: Diagnosis present

## 2024-03-04 DIAGNOSIS — J45909 Unspecified asthma, uncomplicated: Secondary | ICD-10-CM | POA: Insufficient documentation

## 2024-03-04 DIAGNOSIS — E876 Hypokalemia: Secondary | ICD-10-CM | POA: Diagnosis not present

## 2024-03-04 DIAGNOSIS — R112 Nausea with vomiting, unspecified: Secondary | ICD-10-CM | POA: Diagnosis not present

## 2024-03-04 DIAGNOSIS — J019 Acute sinusitis, unspecified: Secondary | ICD-10-CM | POA: Insufficient documentation

## 2024-03-04 LAB — URINALYSIS, W/ REFLEX TO CULTURE (INFECTION SUSPECTED)
Bilirubin Urine: NEGATIVE
Glucose, UA: NEGATIVE mg/dL
Hgb urine dipstick: NEGATIVE
Ketones, ur: 5 mg/dL — AB
Leukocytes,Ua: NEGATIVE
Nitrite: NEGATIVE
Protein, ur: NEGATIVE mg/dL
Specific Gravity, Urine: 1.015 (ref 1.005–1.030)
pH: 5 (ref 5.0–8.0)

## 2024-03-04 LAB — CBC
HCT: 37.1 % (ref 36.0–46.0)
Hemoglobin: 13.2 g/dL (ref 12.0–15.0)
MCH: 33.8 pg (ref 26.0–34.0)
MCHC: 35.6 g/dL (ref 30.0–36.0)
MCV: 94.9 fL (ref 80.0–100.0)
Platelets: 344 K/uL (ref 150–400)
RBC: 3.91 MIL/uL (ref 3.87–5.11)
RDW: 11.8 % (ref 11.5–15.5)
WBC: 8.4 K/uL (ref 4.0–10.5)
nRBC: 0 % (ref 0.0–0.2)

## 2024-03-04 LAB — I-STAT CHEM 8, ED
BUN: <3 mg/dL — ABNORMAL LOW (ref 6–20)
Calcium, Ion: 1.12 mmol/L — ABNORMAL LOW (ref 1.15–1.40)
Chloride: 99 mmol/L (ref 98–111)
Creatinine, Ser: 0.7 mg/dL (ref 0.44–1.00)
Glucose, Bld: 123 mg/dL — ABNORMAL HIGH (ref 70–99)
HCT: 39 % (ref 36.0–46.0)
Hemoglobin: 13.3 g/dL (ref 12.0–15.0)
Potassium: 3.2 mmol/L — ABNORMAL LOW (ref 3.5–5.1)
Sodium: 138 mmol/L (ref 135–145)
TCO2: 25 mmol/L (ref 22–32)

## 2024-03-04 LAB — COMPREHENSIVE METABOLIC PANEL WITH GFR
ALT: 18 U/L (ref 0–44)
AST: 35 U/L (ref 15–41)
Albumin: 3.9 g/dL (ref 3.5–5.0)
Alkaline Phosphatase: 62 U/L (ref 38–126)
Anion gap: 15 (ref 5–15)
BUN: <5 mg/dL — ABNORMAL LOW (ref 6–20)
CO2: 26 mmol/L (ref 22–32)
Calcium: 9.3 mg/dL (ref 8.9–10.3)
Chloride: 98 mmol/L (ref 98–111)
Creatinine, Ser: 0.8 mg/dL (ref 0.44–1.00)
GFR, Estimated: >60 mL/min (ref >60)
Glucose, Bld: 127 mg/dL — ABNORMAL HIGH (ref 70–99)
Potassium: 3.2 mmol/L — ABNORMAL LOW (ref 3.5–5.1)
Sodium: 139 mmol/L (ref 135–145)
Total Bilirubin: 0.5 mg/dL (ref 0.0–1.2)
Total Protein: 6.6 g/dL (ref 6.5–8.1)

## 2024-03-04 LAB — HCG, SERUM, QUALITATIVE
Preg, Serum: NEGATIVE
Preg, Serum: NEGATIVE

## 2024-03-04 LAB — RAPID URINE DRUG SCREEN, HOSP PERFORMED
Amphetamines: NOT DETECTED
Barbiturates: NOT DETECTED
Benzodiazepines: POSITIVE — AB
Cocaine: NOT DETECTED
Opiates: NOT DETECTED
Tetrahydrocannabinol: POSITIVE — AB

## 2024-03-04 LAB — MAGNESIUM: Magnesium: 1.5 mg/dL — ABNORMAL LOW (ref 1.7–2.4)

## 2024-03-04 LAB — LIPASE, BLOOD: Lipase: 22 U/L (ref 11–51)

## 2024-03-04 MED ORDER — MORPHINE SULFATE (PF) 4 MG/ML IV SOLN
4.0000 mg | Freq: Once | INTRAVENOUS | Status: AC
  Administered 2024-03-04: 4 mg via INTRAVENOUS
  Filled 2024-03-04: qty 1

## 2024-03-04 MED ORDER — DROPERIDOL 2.5 MG/ML IJ SOLN
1.2500 mg | Freq: Once | INTRAMUSCULAR | Status: AC
  Administered 2024-03-04: 1.25 mg via INTRAVENOUS
  Filled 2024-03-04: qty 2

## 2024-03-04 MED ORDER — MAGNESIUM SULFATE 2 GM/50ML IV SOLN
2.0000 g | Freq: Once | INTRAVENOUS | Status: AC
  Administered 2024-03-04: 2 g via INTRAVENOUS
  Filled 2024-03-04: qty 50

## 2024-03-04 MED ORDER — ONDANSETRON HCL 4 MG/2ML IJ SOLN
4.0000 mg | Freq: Once | INTRAMUSCULAR | Status: AC
  Administered 2024-03-04: 4 mg via INTRAVENOUS
  Filled 2024-03-04: qty 2

## 2024-03-04 MED ORDER — ONDANSETRON 4 MG PO TBDP
4.0000 mg | ORAL_TABLET | Freq: Once | ORAL | Status: DC
  Filled 2024-03-04 (×2): qty 1

## 2024-03-04 MED ORDER — POTASSIUM CHLORIDE CRYS ER 20 MEQ PO TBCR: 40.0000 meq | EXTENDED_RELEASE_TABLET | Freq: Two times a day (BID) | ORAL | 0 refills | Status: AC

## 2024-03-04 MED ORDER — SODIUM CHLORIDE 0.9 % IV BOLUS
1000.0000 mL | Freq: Once | INTRAVENOUS | Status: AC
  Administered 2024-03-04: 1000 mL via INTRAVENOUS

## 2024-03-04 MED ORDER — POTASSIUM CHLORIDE 10 MEQ/100ML IV SOLN
10.0000 meq | INTRAVENOUS | Status: AC
  Administered 2024-03-04 (×3): 10 meq via INTRAVENOUS
  Filled 2024-03-04 (×3): qty 100

## 2024-03-04 MED ORDER — AMOXICILLIN-POT CLAVULANATE 875-125 MG PO TABS: 1.0000 | ORAL_TABLET | Freq: Two times a day (BID) | ORAL | 0 refills | Status: AC

## 2024-03-04 NOTE — ED Notes (Signed)
Pt unable to provide urine sample at the time

## 2024-03-04 NOTE — ED Provider Notes (Signed)
 [facility] EMERGENCY DEPARTMENT AT [facility] Provider Note   CSN: [id number] Arrival date & time: 03/04/24  1236     Patient presents with: No chief complaint on file.   Laura Montoya is a 38 y.o. female.   38 year old female who appears older than her stated age. Presents today for concern of headache, abdominal pain, nausea, vomiting.  Recently discharged for hematemesis yesterday.  She states she returned home to a stressful situation which is the flare for her emesis.  Has not been able to keep anything down.  States she has had headaches on and off for the past couple weeks.  Does have history of headaches.  This feels typical.  Not the worst headache of her life.  The history is provided by the patient. No language interpreter was used.       Prior to Admission medications   Medication Sig Start Date End Date Taking? Authorizing Provider  acetaminophen  (TYLENOL ) 500 MG tablet Take 1,000 mg by mouth every 6 (six) hours as needed for mild pain (pain score 1-3) or moderate pain (pain score 4-6).    [provider]  albuterol (PROVENTIL HFA;VENTOLIN HFA) 108 (90 Base) MCG/ACT inhaler Inhale 1-2 puffs into the lungs every 6 (six) hours as needed for wheezing or shortness of breath.    [provider]  Cyanocobalamin  (B-12) 2500 MCG TABS Take 1 tablet by mouth as needed.    [provider]  DULoxetine  (CYMBALTA ) 30 MG capsule Take 1 capsule (30 mg total) by mouth 2 (two) times daily. 07/04/23   Patel, Donika K, DO  fluticasone (FLONASE) 50 MCG/ACT nasal spray Place 1 spray into both nostrils as needed. 05/30/22   [provider]  folic acid  (FOLVITE ) 1 MG tablet Take 1 mg by mouth daily. 06/27/21   [provider]  hydrochlorothiazide (HYDRODIURIL) 12.5 MG tablet Take 12.5 mg by mouth as needed. 06/01/23   [provider]  hydroxychloroquine (PLAQUENIL) 200 MG tablet Take 200 mg by mouth at bedtime.    [provider]  hydrOXYzine (ATARAX) 50 MG tablet Take 50 mg by mouth as needed. 02/19/19   [provider]  lidocaine  (LIDODERM ) 5 % Place 1 patch onto the skin daily. Remove & Discard patch within 12 hours or as directed by MD 11/22/22   Prosperi, Sherlean H, PA-C  loratadine (CLARITIN) 10 MG tablet Take 10 mg by mouth as needed. 12/27/22   [provider]  methotrexate (RHEUMATREX) 2.5 MG tablet Take 12.5 mg by mouth every Sunday. (5 tablets) 07/19/21   [provider]  metoprolol succinate (TOPROL-XL) 50 MG 24 hr tablet Take 50 mg by mouth as needed. 04/20/22   [provider]  ondansetron  (ZOFRAN -ODT) 4 MG disintegrating tablet Take 1 tablet (4 mg total) by mouth every 8 (eight) hours as needed for nausea or vomiting. 03/03/24   Diona Perkins, MD  pantoprazole  (PROTONIX ) 40 MG tablet Take 1 tablet (40 mg total) by mouth 2 (two) times daily. 03/03/24   Diona Perkins, MD  thiamine  (VITAMIN B1) 100 MG tablet Take 100 mg by mouth as needed. 02/19/19   [provider]  tiZANidine  (ZANAFLEX ) 4 MG tablet Take 2-4 mg by mouth 3 (three) times daily as needed.    [provider]  traZODone (DESYREL) 50 MG tablet Take 50 mg by mouth at bedtime as needed. 07/06/23   [provider]    Allergies: Nsaids and Carbamazepine    Review of Systems  Constitutional:  Negative for chills and fever.  Eyes:  Negative for photophobia and visual disturbance.  Respiratory:  Negative for shortness of breath.   Cardiovascular:  Negative for chest pain.  Gastrointestinal:  Positive for abdominal pain, nausea and vomiting.  Genitourinary:  Negative for dysuria.  Neurological:  Positive for headaches.  All other systems reviewed and are negative.   Updated Vital Signs BP (!) 142/104   Pulse 68   Temp 98.2 F (36.8 C) (Oral)   Resp 16   Ht 5' 3 (1.6 m)   Wt 49.9 kg   LMP 02/26/2024 (Approximate)   SpO2 100%   BMI 19.49 kg/m   Physical Exam Vitals and nursing note  reviewed.  Constitutional:      General: She is not in acute distress.    Appearance: Normal appearance. She is not ill-appearing.  HENT:     Head: Normocephalic and atraumatic.     Nose: Nose normal.  Eyes:     Conjunctiva/sclera: Conjunctivae normal.  Cardiovascular:     Rate and Rhythm: Normal rate and regular rhythm.  Pulmonary:     Effort: Pulmonary effort is normal. No respiratory distress.  Abdominal:     General: There is no distension.     Palpations: Abdomen is soft.     Tenderness: There is abdominal tenderness. There is guarding.  Musculoskeletal:        General: No deformity.  Skin:    Findings: No rash.  Neurological:     General: No focal deficit present.     Mental Status: She is alert and oriented to person, place, and time. Mental status is at baseline.     Cranial Nerves: No cranial nerve deficit.     Motor: No weakness.     Comments: Cranial nerves III through XII intact.  Evidence of tardive dyskinesia noted.  No facial droop.  Normal speech.  Good range of motion in upper and lower extremities with good strength.  No pronator drift.     (all labs ordered are listed, but only abnormal results are displayed) Labs Reviewed  I-STAT CHEM 8, ED - Abnormal; Notable for the following components:      Result Value   Potassium 3.2 (*)    BUN <3 (*)    Glucose, Bld 123 (*)    Calcium, Ion 1.12 (*)    All other components within normal limits  HCG, SERUM, QUALITATIVE  CBC WITH DIFFERENTIAL/PLATELET  URINALYSIS, W/ REFLEX TO CULTURE (INFECTION SUSPECTED)  RAPID URINE DRUG SCREEN, HOSP PERFORMED  CBC  COMPREHENSIVE METABOLIC PANEL WITH GFR  LIPASE, BLOOD  MAGNESIUM   HCG, SERUM, QUALITATIVE    EKG: None  Radiology: CT Head Wo Contrast Result Date: 03/04/2024 EXAM: CT HEAD WITHOUT CONTRAST 03/04/2024 01:15:01 PM TECHNIQUE: CT of the head was performed without the administration of intravenous contrast. Automated exposure control, iterative reconstruction,  and/or weight based adjustment of the mA/kV was utilized to reduce the radiation dose to as low as reasonably achievable. COMPARISON: MRI head 04/04/2015. CLINICAL HISTORY: Headache, increasing frequency or severity. FINDINGS: BRAIN AND VENTRICLES: No acute hemorrhage. No evidence of acute infarct. No hydrocephalus. No extra-axial collection. No mass effect or midline shift. ORBITS: No acute abnormality. SINUSES: There are frothy secretions within the right frontal sinus extending into the right anterior ethmoid air cells. Additional foci of secretions are present in the sphenoid sinuses, more pronounced on the right. Frothy secretions and layering fluid are noted in the maxillary sinuses, right greater than left. There is thickening of  the sphenoid sinus walls and right maxillary sinus wall suggestive of chronic sinusitis. SOFT TISSUES AND SKULL: No acute soft tissue abnormality. No skull fracture. IMPRESSION: 1. No acute intracranial abnormality. 2. Findings consistent with acute on chronic sinusitis involving the maxillary (right greater than left), right frontal, right anterior ethmoid, and sphenoid sinuses. Electronically signed by: Donnice Mania MD 03/04/2024 01:37 PM EST RP Workstation: [id number]     Procedures   Medications Ordered in the ED  ondansetron  (ZOFRAN -ODT) disintegrating tablet 4 mg (4 mg Oral Patient Refused/Not Given 03/04/24 1409)  morphine  (PF) 4 MG/ML injection 4 mg (has no administration in time range)  sodium chloride  0.9 % bolus 1,000 mL (has no administration in time range)  potassium chloride  10 mEq in 100 mL IVPB (has no administration in time range)                                    Medical Decision Making Risk Prescription drug management.   Medical Decision Making / ED Course   This patient presents to the ED for concern of nausea and vomiting, this involves an extensive number of treatment options, and is a complaint that carries with it a high risk of  complications and morbidity.  The differential diagnosis includes cannabinoid hyperemesis syndrome, gastroenteritis, pancreatitis, appendicitis, cholecystitis, gastritis  MDM: 38 year old female presents today for concern of nausea and vomiting.  Recently admitted for hematemesis and had an EGD done which showed esophagitis and possible gastroparesis.  Patient states once she went home she returned to stressful environment and that brought on her symptoms because her nausea and vomiting is stress-induced.  She does have history of marijuana use and has had cannabinoid hyperemesis syndrome.  She states that she has not had marijuana in about a week and does not feel that this is related to it.  She reported a headache which also improved with medicines in the emergency department.  No concerning findings on CT head outside of the sinus.  CBC unremarkable, CMP without acute concern with the exception of potassium being slightly low at 3.2.  Repletion given.  Magnesium  low as well.  Repletion given.  UDS positive for benzos and THC.  UA without evidence of UTI.  Pregnancy test negative.  Symptoms improved on reevaluation.  Patient is stable for discharge.  Discharged in stable condition.   Additional history obtained: -Additional history obtained from chart review from recent admission -External records from outside source obtained and reviewed including: Chart review including previous notes, labs, imaging, consultation notes   Lab Tests: -I ordered, reviewed, and interpreted labs.   The pertinent results include:   Labs Reviewed  URINALYSIS, W/ REFLEX TO CULTURE (INFECTION SUSPECTED) - Abnormal; Notable for the following components:      Result Value   APPearance CLOUDY (*)    Ketones, ur 5 (*)    Bacteria, UA RARE (*)    All other components within normal limits  I-STAT CHEM 8, ED - Abnormal; Notable for the following components:   Potassium 3.2 (*)    BUN <3 (*)    Glucose, Bld 123  (*)    Calcium, Ion 1.12 (*)    All other components within normal limits  HCG, SERUM, QUALITATIVE  CBC  CBC WITH DIFFERENTIAL/PLATELET  RAPID URINE DRUG SCREEN, HOSP PERFORMED  COMPREHENSIVE METABOLIC PANEL WITH GFR  LIPASE, BLOOD  MAGNESIUM   HCG, SERUM, QUALITATIVE  EKG  EKG Interpretation Date/Time:    Ventricular Rate:    PR Interval:    QRS Duration:    QT Interval:    QTC Calculation:   R Axis:      Text Interpretation:           Imaging Studies ordered: I ordered imaging studies including CT head without contrast I independently visualized and interpreted imaging. I agree with the radiologist interpretation   Medicines ordered and prescription drug management: Meds ordered this encounter  Medications   ondansetron  (ZOFRAN -ODT) disintegrating tablet 4 mg   morphine  (PF) 4 MG/ML injection 4 mg   sodium chloride  0.9 % bolus 1,000 mL   potassium chloride  10 mEq in 100 mL IVPB    -I have reviewed the patients home medicines and have made adjustments as needed  Reevaluation: After the interventions noted above, I reevaluated the patient and found that they have :improved  Co morbidities that complicate the patient evaluation  Past Medical History:  Diagnosis Date   Anxiety    Asthma    Bipolar depression (HCC)    Dizziness    Dysmenorrhea    GERD (gastroesophageal reflux disease)    Headache    Mood disorder    RA (rheumatoid arthritis) (HCC)    Respiratory alkalosis 03/01/2024   Schizophrenia (HCC)    Schizophrenia (HCC)    Scoliosis    Thrush       Dispostion: Discharged in stable condition.  Return precaution discussed.  Patient voices understanding and is in agreement with plan.   Final diagnoses:  Nausea and vomiting, unspecified vomiting type  Acute sinusitis, recurrence not specified, unspecified location    ED Discharge Orders          Ordered    potassium chloride  SA (KLOR-CON  M) 20 MEQ tablet  2 times daily         03/04/24 2205    amoxicillin -clavulanate (AUGMENTIN) 875-125 MG tablet  Every 12 hours        03/04/24 2206               Hildegard Loge, PA-C 03/04/24 2211    Mannie Pac T, DO 03/05/24 2326

## 2024-03-04 NOTE — ED Notes (Signed)
 Pt advised that urine sample is needed

## 2024-03-04 NOTE — Discharge Instructions (Addendum)
 Your workup was reassuring.  Your potassium was slightly low so you received supplementation.  I have sent a potassium supplement to the pharmacy for you.  Return for any emergent symptoms.  Follow-up with your primary care doctor.  CT scan of your head showed sinusitis.  Have sent antibiotic into the pharmacy for you.  Also perform sinus rinse.  Information attached above regarding this.

## 2024-03-04 NOTE — ED Provider Triage Note (Signed)
 Emergency Medicine Provider Triage Evaluation Note  Michalina Calbert , a 38 y.o. female  was evaluated in triage.  Pt complains of here for evaluation of nausea, vomiting.  Patient states she has had persistent nausea and vomiting occasionally darker and coffee emesis.  Was recently admitted for same discharged yesterday.  Had EGD which showed esophagitis and possible gastroparesis.  She has a history cannabinoid use.  Some generalized abdominal pain which started after the emesis.  Denies any melanotic stool.  No EtOH use, chronic NSAID use. Hx of prolonged qt on prior EKG. States she has a horrible headache. No sudden onset HA, numbness, vision changes, neck pain/ stiffness.  Review of Systems  Positive: N/V Negative: Fever, CP, sob  Physical Exam  BP (!) 145/92   Pulse 74   Temp 98 F (36.7 C)   Resp (!) 22   Ht 5' 3 (1.6 m)   Wt 49.9 kg   LMP 02/26/2024 (Approximate)   SpO2 98%   BMI 19.49 kg/m  Gen:   Awake, no distress   Resp:  Normal effort  MSK:   Moves extremities without difficulty  Neuro:  Cn 2-12 grossly intact. Equal strength, intact sensation Other:    Medical Decision Making  Medically screening exam initiated at 1:03 PM.  Appropriate orders placed.  [name] was informed that the remainder of the evaluation will be completed by another provider, this initial triage assessment does not replace that evaluation, and the importance of remaining in the ED until their evaluation is complete.  N/V>>> HA   Marquita Lias A, PA-C 03/04/24 1304

## 2024-03-04 NOTE — ED Triage Notes (Signed)
 Patient states she has been having nasuea, vomiting and headache for the past week. She states that she was discharged from the hospital yesterday. She states she is having a lot of abdominal pain.

## 2024-03-06 ENCOUNTER — Ambulatory Visit: Payer: Self-pay | Admitting: Internal Medicine

## 2024-03-06 LAB — SURGICAL PATHOLOGY

## 2024-03-09 ENCOUNTER — Emergency Department (HOSPITAL_COMMUNITY)
Admission: EM | Admit: 2024-03-09 | Discharge: 2024-03-10 | Disposition: A | Discharge status: Home / Self Care | Payer: MEDICAID | Attending: Emergency Medicine | Admitting: Emergency Medicine

## 2024-03-09 ENCOUNTER — Other Ambulatory Visit: Payer: Self-pay

## 2024-03-09 ENCOUNTER — Encounter (HOSPITAL_COMMUNITY): Payer: Self-pay | Admitting: *Deleted

## 2024-03-09 DIAGNOSIS — R1115 Cyclical vomiting syndrome unrelated to migraine: Secondary | ICD-10-CM | POA: Diagnosis not present

## 2024-03-09 DIAGNOSIS — M791 Myalgia, unspecified site: Secondary | ICD-10-CM | POA: Diagnosis not present

## 2024-03-09 DIAGNOSIS — J45909 Unspecified asthma, uncomplicated: Secondary | ICD-10-CM | POA: Diagnosis not present

## 2024-03-09 DIAGNOSIS — R112 Nausea with vomiting, unspecified: Secondary | ICD-10-CM | POA: Diagnosis present

## 2024-03-09 LAB — CBC WITH DIFFERENTIAL/PLATELET
Abs Immature Granulocytes: 0.04 K/uL (ref 0.00–0.07)
Basophils Absolute: 0.1 K/uL (ref 0.0–0.1)
Basophils Relative: 1 %
Eosinophils Absolute: 0.2 K/uL (ref 0.0–0.5)
Eosinophils Relative: 2 %
HCT: 40.7 % (ref 36.0–46.0)
Hemoglobin: 14 g/dL (ref 12.0–15.0)
Immature Granulocytes: 0 %
Lymphocytes Relative: 17 %
Lymphs Abs: 2.2 K/uL (ref 0.7–4.0)
MCH: 33.7 pg (ref 26.0–34.0)
MCHC: 34.4 g/dL (ref 30.0–36.0)
MCV: 98.1 fL (ref 80.0–100.0)
Monocytes Absolute: 0.6 K/uL (ref 0.1–1.0)
Monocytes Relative: 5 %
Neutro Abs: 9.8 K/uL — ABNORMAL HIGH (ref 1.7–7.7)
Neutrophils Relative %: 75 %
Platelets: 464 K/uL — ABNORMAL HIGH (ref 150–400)
RBC: 4.15 MIL/uL (ref 3.87–5.11)
RDW: 12.1 % (ref 11.5–15.5)
WBC: 12.9 K/uL — ABNORMAL HIGH (ref 4.0–10.5)
nRBC: 0 % (ref 0.0–0.2)

## 2024-03-09 LAB — COMPREHENSIVE METABOLIC PANEL WITH GFR
ALT: 13 U/L (ref 0–44)
AST: 20 U/L (ref 15–41)
Albumin: 3.8 g/dL (ref 3.5–5.0)
Alkaline Phosphatase: 63 U/L (ref 38–126)
Anion gap: 14 (ref 5–15)
BUN: <5 mg/dL — ABNORMAL LOW (ref 6–20)
CO2: 25 mmol/L (ref 22–32)
Calcium: 9.4 mg/dL (ref 8.9–10.3)
Chloride: 100 mmol/L (ref 98–111)
Creatinine, Ser: 0.81 mg/dL (ref 0.44–1.00)
GFR, Estimated: >60 mL/min (ref >60)
Glucose, Bld: 112 mg/dL — ABNORMAL HIGH (ref 70–99)
Potassium: 3.6 mmol/L (ref 3.5–5.1)
Sodium: 139 mmol/L (ref 135–145)
Total Bilirubin: 0.8 mg/dL (ref 0.0–1.2)
Total Protein: 6.6 g/dL (ref 6.5–8.1)

## 2024-03-09 LAB — CK: Total CK: 53 U/L (ref 38–234)

## 2024-03-09 LAB — MAGNESIUM: Magnesium: 1.6 mg/dL — ABNORMAL LOW (ref 1.7–2.4)

## 2024-03-09 LAB — HCG, SERUM, QUALITATIVE: Preg, Serum: NEGATIVE

## 2024-03-09 LAB — LIPASE, BLOOD: Lipase: 21 U/L (ref 11–51)

## 2024-03-09 MED ORDER — LORAZEPAM 2 MG/ML IJ SOLN
1.0000 mg | Freq: Once | INTRAMUSCULAR | Status: DC
  Filled 2024-03-09: qty 1

## 2024-03-09 MED ORDER — LORAZEPAM 2 MG/ML IJ SOLN
1.0000 mg | Freq: Once | INTRAMUSCULAR | Status: AC
  Administered 2024-03-09: 1 mg via INTRAVENOUS
  Filled 2024-03-09: qty 1

## 2024-03-09 MED ORDER — METOCLOPRAMIDE HCL 5 MG/ML IJ SOLN
10.0000 mg | Freq: Once | INTRAMUSCULAR | Status: AC
  Administered 2024-03-09: 10 mg via INTRAVENOUS
  Filled 2024-03-09: qty 2

## 2024-03-09 MED ORDER — LACTATED RINGERS IV SOLN: INTRAVENOUS | Status: DC

## 2024-03-09 MED ORDER — LACTATED RINGERS IV BOLUS
1000.0000 mL | Freq: Once | INTRAVENOUS | Status: AC
  Administered 2024-03-09: 1000 mL via INTRAVENOUS

## 2024-03-09 NOTE — ED Provider Notes (Signed)
 Care of patient received from prior provider at 11:12 PM, please see their note for complete H/P and care plan.  Received handoff per ED course.  Clinical Course as of 03/10/24 0310  Sat Mar 09, 2024  2311 Stable HO [facility] Recent upper endoscopy CCX of CVS today acute on chronic.  Labs and exam benign.  Getting therapeutics now. Reassess. [CC]  Sun Mar 10, 2024  0231 Hgb urine dipstick: NEGATIVE [CC]    Clinical Course User Index [CC] Jerral Meth, MD    Reassessment: 38 year old female with cyclic vomiting syndrome symptoms.  Recurrent event.  Multiple visits recently.  Abdominal exam benign. Lab work reassuring.  Now tolerating p.o. intake after administration of IV medications.  Observed for total of 12 hours in the ER and feels symptomatically resolved.  Prescribed ongoing care in the outpatient setting with strict return precautions reinforced.     Jerral Meth, MD 03/10/24 [phone]

## 2024-03-09 NOTE — ED Triage Notes (Signed)
 Nurse First Note: Pt presents with recurrrent N/V that started again last night. Pt was seen in ED on 11/5 and prescribed medicine and sent home. Pt does not know what her diagnosis was.

## 2024-03-09 NOTE — ED Provider Notes (Signed)
 [place] EMERGENCY DEPARTMENT AT [facility] Provider Note   CSN: [id number] Arrival date & time: 03/09/24  1455     Patient presents with: No chief complaint on file.   Laura Montoya is a 38 y.o. female.   Pt is a 38 y.o.female with a history of Idiopathic scoliosis, Schizophrenia, Bipolar, Anxiety, Asthma, and RA with recent admission at the beginning of this month with hematemesis and EGD with Grade D acute esophagitis w/o active bleedalso showed bilious gastric fluid, for which a component of gastroparesis could be considered. Otherwise EGD with normal stomach and duodenum placed on PPI who is returning today with N/V and body pain.  She reports it started earlier this morning and she has not been able to hold anything down all day.  She denies any hematemesis at this time.  However she has not been able to eat anything and states this episode is worse because she is having pain all over.  No fevers.  She is out of her Phenergan  suppositories and attempted to take the other medication she had at home without help.  The history is provided by the patient and medical records.       Prior to Admission medications   Medication Sig Start Date End Date Taking? Authorizing Provider  acetaminophen  (TYLENOL ) 500 MG tablet Take 1,000 mg by mouth every 6 (six) hours as needed for mild pain (pain score 1-3) or moderate pain (pain score 4-6).    [provider]  albuterol (PROVENTIL HFA;VENTOLIN HFA) 108 (90 Base) MCG/ACT inhaler Inhale 1-2 puffs into the lungs every 6 (six) hours as needed for wheezing or shortness of breath.    [provider]  amoxicillin -clavulanate (AUGMENTIN) 875-125 MG tablet Take 1 tablet by mouth every 12 (twelve) hours. 03/04/24   Hildegard, Amjad, PA-C  Cyanocobalamin  (B-12) 2500 MCG TABS Take 1 tablet by mouth as needed.    [provider]  DULoxetine  (CYMBALTA ) 30 MG capsule Take 1 capsule (30 mg total) by mouth 2 (two) times daily.  07/04/23   Patel, Donika K, DO  fluticasone (FLONASE) 50 MCG/ACT nasal spray Place 1 spray into both nostrils as needed. 05/30/22   [provider]  folic acid  (FOLVITE ) 1 MG tablet Take 1 mg by mouth daily. 06/27/21   [provider]  hydrochlorothiazide (HYDRODIURIL) 12.5 MG tablet Take 12.5 mg by mouth as needed. 06/01/23   [provider]  hydroxychloroquine (PLAQUENIL) 200 MG tablet Take 200 mg by mouth at bedtime.    [provider]  hydrOXYzine (ATARAX) 50 MG tablet Take 50 mg by mouth as needed. 02/19/19   [provider]  lidocaine  (LIDODERM ) 5 % Place 1 patch onto the skin daily. Remove & Discard patch within 12 hours or as directed by MD 11/22/22   Prosperi, Sherlean H, PA-C  loratadine (CLARITIN) 10 MG tablet Take 10 mg by mouth as needed. 12/27/22   [provider]  methotrexate (RHEUMATREX) 2.5 MG tablet Take 12.5 mg by mouth every Sunday. (5 tablets) 07/19/21   [provider]  metoprolol succinate (TOPROL-XL) 50 MG 24 hr tablet Take 50 mg by mouth as needed. 04/20/22   [provider]  ondansetron  (ZOFRAN -ODT) 4 MG disintegrating tablet Take 1 tablet (4 mg total) by mouth every 8 (eight) hours as needed for nausea or vomiting. 03/03/24   Diona Perkins, MD  pantoprazole  (PROTONIX ) 40 MG tablet Take 1 tablet (40 mg total) by mouth 2 (two) times daily. 03/03/24   Diona,  Damien, MD  potassium chloride  SA (KLOR-CON  M) 20 MEQ tablet Take 2 tablets (40 mEq total) by mouth 2 (two) times daily for 5 days. 03/04/24 03/09/24  Hildegard Loge, PA-C  thiamine  (VITAMIN B1) 100 MG tablet Take 100 mg by mouth as needed. 02/19/19   [provider]  tiZANidine  (ZANAFLEX ) 4 MG tablet Take 2-4 mg by mouth 3 (three) times daily as needed.    [provider]  traZODone (DESYREL) 50 MG tablet Take 50 mg by mouth at bedtime as needed. 07/06/23   [provider]    Allergies: Nsaids and Carbamazepine    Review of  Systems  Updated Vital Signs BP (!) 152/95   Pulse 100   Temp 98.3 F (36.8 C) (Oral)   Resp 18   Ht 5' 3 (1.6 m)   Wt 49.9 kg   LMP 02/26/2024 (Approximate)   SpO2 99%   BMI 19.49 kg/m   Physical Exam Vitals and nursing note reviewed.  Constitutional:      General: She is not in acute distress.    Appearance: She is well-developed.     Comments: Appears uncomfortable  HENT:     Head: Normocephalic and atraumatic.     Mouth/Throat:     Mouth: Mucous membranes are dry.  Eyes:     Pupils: Pupils are equal, round, and reactive to light.  Cardiovascular:     Rate and Rhythm: Normal rate and regular rhythm.     Heart sounds: Normal heart sounds. No murmur heard.    No friction rub.  Pulmonary:     Effort: Pulmonary effort is normal.     Breath sounds: Normal breath sounds. No wheezing or rales.  Abdominal:     General: Bowel sounds are normal. There is no distension.     Palpations: Abdomen is soft.     Tenderness: There is abdominal tenderness. There is no guarding or rebound.     Comments: Diffuse tenderness but abdomen is soft without guarding  Musculoskeletal:        General: No tenderness. Normal range of motion.     Right lower leg: No edema.     Left lower leg: No edema.     Comments: No edema  Skin:    General: Skin is warm and dry.     Findings: No rash.  Neurological:     Mental Status: She is alert and oriented to person, place, and time. Mental status is at baseline.     Cranial Nerves: No cranial nerve deficit.  Psychiatric:        Behavior: Behavior normal.     (all labs ordered are listed, but only abnormal results are displayed) Labs Reviewed  CBC WITH DIFFERENTIAL/PLATELET - Abnormal; Notable for the following components:      Result Value   WBC 12.9 (*)    Platelets 464 (*)    Neutro Abs 9.8 (*)    All other components within normal limits  COMPREHENSIVE METABOLIC PANEL WITH GFR - Abnormal; Notable for the following components:   Glucose,  Bld 112 (*)    BUN <5 (*)    All other components within normal limits  MAGNESIUM  - Abnormal; Notable for the following components:   Magnesium  1.6 (*)    All other components within normal limits  LIPASE, BLOOD  HCG, SERUM, QUALITATIVE  CK  URINALYSIS, ROUTINE W REFLEX MICROSCOPIC    EKG: None  Radiology: No results found.   Procedures   Medications Ordered in the  ED  LORazepam  (ATIVAN ) injection 1 mg (has no administration in time range)    Clinical Course as of 03/09/24 2325  Sat Mar 09, 2024  2311 Stable HO [facility] Recent upper endoscopy CCX of CVS today acute on chronic.  Labs and exam benign.  Getting therapeutics now. Reassess. [CC]    Clinical Course User Index [CC] Jerral Meth, MD                                 Medical Decision Making Risk Prescription drug management.   Pt with multiple medical problems and comorbidities and presenting today with a complaint that caries a high risk for morbidity and mortality.  Returning for recurrent nausea and vomiting.  Because of patient's prolonged QT she is on Reglan  and Phenergan  but ran out of her suppositories at home.  Recently hospitalized for hematemesis which found signs of esophagitis but no active bleeding.  Currently on a PPI.  Based on abdominal exam here low suspicion for appendicitis, obstruction, diverticulitis.  Suspect exacerbation of her gastroparesis versus cannabis hyperemesis syndrome.  I independently interpreted patient's EKG and labs.  EKG did not show any acute findings within normal QT at this time.  CBC with minimal leukocytosis normal hemoglobin, CMP is normal with normal LFTs, normal lipase and pregnancy is negative.  Will give patient symptomatic care and ensure she is tolerating p.o.'s      Final diagnoses:  None    ED Discharge Orders     None          Doretha Folks, MD 03/09/24 2325

## 2024-03-09 NOTE — ED Provider Triage Note (Signed)
 Emergency Medicine Provider Triage Evaluation Note  Laura Montoya , a 38 y.o. female  was evaluated in triage.  Pt complains of Fuhs body aches and some epigastric pain and nausea vomiting for the past few days.  Denies any hematemesis.  Patient has history of gastroparesis and cannabinoid induced hyperemesis syndrome.  Denies any fevers, chest pain, shortness of breath.  Review of Systems  Positive:  Negative:   Physical Exam  BP (!) 152/95   Pulse 100   Temp 98.3 F (36.8 C) (Oral)   Resp 18   Ht 5' 3 (1.6 m)   Wt 49.9 kg   LMP 02/26/2024 (Approximate)   SpO2 99%   BMI 19.49 kg/m  Gen:   Awake, uncomfortable, retching Resp:  Normal effort  MSK:   Moves extremities without difficulty  Other:  Some epigastric tenderness to palpation.  Medical Decision Making  Medically screening exam initiated at 3:46 PM.  Appropriate orders placed.  [name] was informed that the remainder of the evaluation will be completed by another provider, this initial triage assessment does not replace that evaluation, and the importance of remaining in the ED until their evaluation is complete.  History of prolonged QT.  Will order IM Ativan    Bernis Ernst, PA-C 03/09/24 1548

## 2024-03-10 LAB — URINALYSIS, ROUTINE W REFLEX MICROSCOPIC
Bilirubin Urine: NEGATIVE
Glucose, UA: NEGATIVE mg/dL
Hgb urine dipstick: NEGATIVE
Ketones, ur: NEGATIVE mg/dL
Leukocytes,Ua: NEGATIVE
Nitrite: NEGATIVE
Protein, ur: 30 mg/dL — AB
Specific Gravity, Urine: 1.015 (ref 1.005–1.030)
pH: 8 (ref 5.0–8.0)

## 2024-03-10 MED ORDER — MAGNESIUM OXIDE 400 MG PO TABS: 400.0000 mg | ORAL_TABLET | Freq: Every day | ORAL | 0 refills | Status: AC

## 2024-03-10 MED ORDER — PROMETHAZINE HCL 25 MG RE SUPP: 25.0000 mg | Freq: Four times a day (QID) | RECTAL | 0 refills | Status: AC | PRN

## 2024-03-12 NOTE — Anesthesia Postprocedure Evaluation (Signed)
 Anesthesia Post Note  Patient: Laura Montoya  Procedure(s) Performed: EGD (ESOPHAGOGASTRODUODENOSCOPY)     Patient location during evaluation: PACU Anesthesia Type: General Level of consciousness: awake and alert Pain management: pain level controlled Vital Signs Assessment: post-procedure vital signs reviewed and stable Respiratory status: spontaneous breathing, nonlabored ventilation and respiratory function stable Cardiovascular status: blood pressure returned to baseline and stable Postop Assessment: no apparent nausea or vomiting Anesthetic complications: no   No notable events documented.                Cherrell Maybee

## 2024-03-27 ENCOUNTER — Other Ambulatory Visit: Payer: Self-pay | Admitting: Family Medicine

## 2024-04-10 ENCOUNTER — Ambulatory Visit: Payer: MEDICAID | Admitting: Nurse Practitioner

## 2024-05-07 ENCOUNTER — Ambulatory Visit: Payer: MEDICAID | Admitting: Nurse Practitioner

## 2024-05-10 ENCOUNTER — Ambulatory Visit: Payer: MEDICAID | Admitting: Nurse Practitioner

## 2024-05-15 ENCOUNTER — Emergency Department (HOSPITAL_COMMUNITY)
Admission: EM | Admit: 2024-05-15 | Discharge: 2024-05-15 | Disposition: A | Discharge status: Home / Self Care | Payer: MEDICAID | Attending: Emergency Medicine | Admitting: Emergency Medicine

## 2024-05-15 ENCOUNTER — Other Ambulatory Visit: Payer: Self-pay

## 2024-05-15 DIAGNOSIS — F17200 Nicotine dependence, unspecified, uncomplicated: Secondary | ICD-10-CM | POA: Diagnosis not present

## 2024-05-15 DIAGNOSIS — M069 Rheumatoid arthritis, unspecified: Secondary | ICD-10-CM | POA: Diagnosis present

## 2024-05-15 MED ORDER — KETOROLAC TROMETHAMINE 15 MG/ML IJ SOLN
15.0000 mg | Freq: Once | INTRAMUSCULAR | Status: AC
  Administered 2024-05-15: 15 mg via INTRAMUSCULAR
  Filled 2024-05-15: qty 1

## 2024-05-15 MED ORDER — PREDNISONE 20 MG PO TABS: 40.0000 mg | ORAL_TABLET | Freq: Every day | ORAL | 0 refills | Status: AC

## 2024-05-15 NOTE — Discharge Instructions (Addendum)
 Keep your scheduled appointment with your rheumatologist on Friday to discuss your ongoing medications for treatment of RA.  Start prednisone  and take 40 mg (2 tablets) by mouth daily for 5 days.  You may continue other analgesics like Tylenol  or ibuprofen  as needed for pain.  Return to the emergency department if your symptoms worsen.

## 2024-05-15 NOTE — ED Provider Notes (Signed)
 " [name] Provider Note   CSN: [id number] Arrival date & time: 05/15/24  9398     Patient presents with: Hnads Pain /RA Flare Up   Laura Montoya is a 39 y.o. female.   39 year old female presenting with RA flare.  Patient has noted worsening discomfort in her hands/wrists for a week now, this has progressively worsened over the past 2 days as well.  Pain is worse at the knuckles bilaterally, with some mild swelling over the dorsal aspect of the right hand as well.  Patient had some leftover prednisone  at home and did take 10 mg for 4 days with minimal relief of her symptoms.  She has tried ibuprofen /naproxen /Tylenol /Flexeril without relief of her discomfort.  Patient is on Plaquenil  long-term for treatment of her RA and was on methotrexate but ran out of this medication in October, she is scheduled to see her rheumatologist this Friday to discuss her ongoing medications.  Denies fever, cough, chest pain, shortness of breath.         Prior to Admission medications  Medication Sig Start Date End Date Taking? Authorizing Provider  acetaminophen  (TYLENOL ) 500 MG tablet Take 1,000 mg by mouth every 6 (six) hours as needed for mild pain (pain score 1-3) or moderate pain (pain score 4-6).    [provider]  albuterol  (PROVENTIL  HFA;VENTOLIN  HFA) 108 (90 Base) MCG/ACT inhaler Inhale 1-2 puffs into the lungs every 6 (six) hours as needed for wheezing or shortness of breath.    [provider]  amoxicillin -clavulanate (AUGMENTIN ) 875-125 MG tablet Take 1 tablet by mouth every 12 (twelve) hours. 03/04/24   Hildegard, Amjad, PA-C  Cyanocobalamin  (B-12) 2500 MCG TABS Take 1 tablet by mouth as needed.    [provider]  DULoxetine  (CYMBALTA ) 30 MG capsule Take 1 capsule (30 mg total) by mouth 2 (two) times daily. 07/04/23   Patel, Donika K, DO  fluticasone (FLONASE) 50 MCG/ACT nasal spray Place 1 spray into both nostrils as needed.  05/30/22   [provider]  folic acid  (FOLVITE ) 1 MG tablet Take 1 mg by mouth daily. 06/27/21   [provider]  hydrochlorothiazide (HYDRODIURIL) 12.5 MG tablet Take 12.5 mg by mouth as needed. 06/01/23   [provider]  hydroxychloroquine  (PLAQUENIL ) 200 MG tablet Take 200 mg by mouth at bedtime.    [provider]  [Paused] hydrOXYzine (ATARAX) 50 MG tablet Take 50 mg by mouth as needed. Wait to take this until your doctor or other care provider tells you to start again. 02/19/19   [provider]  lidocaine  (LIDODERM ) 5 % Place 1 patch onto the skin daily. Remove & Discard patch within 12 hours or as directed by MD 11/22/22   Prosperi, Sherlean H, PA-C  loratadine (CLARITIN) 10 MG tablet Take 10 mg by mouth as needed. 12/27/22   [provider]  magnesium  oxide (MAG-OX) 400 MG tablet Take 1 tablet (400 mg total) by mouth daily. 03/10/24   Jerral Meth, MD  methotrexate (RHEUMATREX) 2.5 MG tablet Take 12.5 mg by mouth every Sunday. (5 tablets) 07/19/21   [provider]  metoprolol succinate (TOPROL-XL) 50 MG 24 hr tablet Take 50 mg by mouth as needed. 04/20/22   [provider]  ondansetron  (ZOFRAN -ODT) 4 MG disintegrating tablet Take 1 tablet (4 mg total) by mouth every 8 (eight) hours as needed for nausea or vomiting. 03/03/24   Diona Perkins, MD  pantoprazole  (PROTONIX ) 40 MG tablet Take  1 tablet (40 mg total) by mouth 2 (two) times daily. 03/03/24   Diona Perkins, MD  potassium chloride  SA (KLOR-CON  M) 20 MEQ tablet Take 2 tablets (40 mEq total) by mouth 2 (two) times daily for 5 days. 03/04/24 03/09/24  Hildegard Loge, PA-C  promethazine  (PHENERGAN ) 25 MG suppository Place 1 suppository (25 mg total) rectally every 6 (six) hours as needed for nausea or vomiting. 03/10/24   Jerral Meth, MD  thiamine  (VITAMIN B1) 100 MG tablet Take 100 mg by mouth as needed. 02/19/19   [provider]  tiZANidine  (ZANAFLEX ) 4 MG tablet  Take 2-4 mg by mouth 3 (three) times daily as needed.    [provider]  traZODone  (DESYREL ) 50 MG tablet Take 50 mg by mouth at bedtime as needed. 07/06/23   [provider]    Allergies: Nsaids and Carbamazepine    Review of Systems  Updated Vital Signs  Vitals:   05/15/24 0606  BP: 110/77  Pulse: 77  Resp: 16  Temp: 97.9 F (36.6 C)  SpO2: 100%     Physical Exam Vitals and nursing note reviewed.  Constitutional:      General: She is not in acute distress.    Appearance: Normal appearance. She is not ill-appearing or toxic-appearing.  HENT:     Head: Normocephalic and atraumatic.  Eyes:     Extraocular Movements: Extraocular movements intact.     Pupils: Pupils are equal, round, and reactive to light.  Cardiovascular:     Rate and Rhythm: Normal rate and regular rhythm.     Pulses:          Radial pulses are 2+ on the right side and 2+ on the left side.     Heart sounds: Normal heart sounds.  Pulmonary:     Effort: Pulmonary effort is normal.     Breath sounds: Normal breath sounds.  Musculoskeletal:     Cervical back: Normal range of motion.     Comments: Hands: No appreciable joint swelling at the MCP/PIP/DIP, MCPs are diffusely tender on exam without appreciable erythema/warmth. Grip strength is diminished bilaterally. There is mild soft tissue swelling to the dorsal aspect of the right hand without pitting/erythema/warmth. Full active/passive ROM at the wrists and the MCP/PIP/DIPs bilaterally, however active ROM does elicit discomfort. No other appreciable joint swelling/erythema/warmth  Skin:    General: Skin is warm and dry.  Neurological:     Mental Status: She is alert and oriented to person, place, and time.     (all labs ordered are listed, but only abnormal results are displayed) Labs Reviewed - No data to display  EKG: None  Radiology: No results found.   Procedures   Medications Ordered in the ED  ketorolac  (TORADOL ) 15  MG/ML injection 15 mg (has no administration in time range)                                    Medical Decision Making This patient presents to the ED for concern of RA flare, this involves an extensive number of treatment options, and is a complaint that carries with it a high risk of complications and morbidity.  The differential diagnosis includes RA flare, septic joint/septic arthritis, OA   Co morbidities that complicate the patient evaluation  RA on Plaquenil     Additional history obtained:  Additional history obtained from record review  External records from outside source obtained  and reviewed including prior rheumatology note   Cardiac Monitoring: / EKG:  The patient was maintained on a cardiac monitor.  I personally viewed and interpreted the cardiac monitored which showed an underlying rhythm of: NSR  Problem List / ED Course / Critical interventions / Medication management  I ordered medication including toradol   for pain  I have reviewed the patients home medicines and have made adjustments as needed   Social Determinants of Health:  Tobacco use    Test / Admission - Considered:  Physical exam is notable as above.  Patient is diffusely tender in the region of the MCPs, grip strength is diminished, these findings are consistent with RA flare likely secondary to medication noncompliance as she has been off of her methotrexate for several months.  Patient does have some mild nonpitting edema to the dorsal aspect of the right hand as well, 2+ radial pulses bilaterally, no appreciable erythema/warmth of the hands or wrist bilaterally, range of motion remains intact however certain movements do elicit discomfort.  Vitals are reassuring, she is afebrile and without tachycardia, she is well-appearing and in no acute distress.   Patient is on Plaquenil  for treatment of her RA which she has been compliant with, was previously on methotrexate but ran out of this prescription  as she missed her last appointment in October due to not having a ride to the appointment.  Patient is scheduled to see her rheumatologist this Friday and does have a reliable way to get to that appointment.  At this time I do not have any suspicion for acute conditions like septic arthritis/joint, patient's symptoms align with previous RA flares that she has experienced.   Will administer IM Toradol  here for pain relief and prescribe 5-day course of prednisone  which should get her through to her appointment this Friday.  Return precautions discussed, patient voiced understanding is in agreement this plan, she is appropriate for discharge at this time.    Risk Prescription drug management.        Final diagnoses:  Rheumatoid arthritis flare [facility])    ED Discharge Orders          Ordered    predniSONE  (DELTASONE ) 20 MG tablet  Daily        05/15/24 0656               Glendia Rocky SAILOR, PA-C 05/15/24 0708    Ruthe Cornet, DO 05/15/24 0715  "

## 2024-05-15 NOTE — ED Triage Notes (Signed)
 Patient reports bilateral hands pain for several days , denies injury , states RA flare up unrelieved by Rx medications.

## 2024-05-16 ENCOUNTER — Other Ambulatory Visit: Payer: Self-pay | Admitting: Adult Medicine

## 2024-05-16 DIAGNOSIS — Z1231 Encounter for screening mammogram for malignant neoplasm of breast: Secondary | ICD-10-CM

## 2024-07-08 ENCOUNTER — Ambulatory Visit: Payer: MEDICAID | Admitting: Neurology

## 2024-07-09 ENCOUNTER — Ambulatory Visit: Payer: MEDICAID | Admitting: Neurology
